# Patient Record
Sex: Female | Born: 1935 | Race: White | Hispanic: No | Smoking: Former smoker
Health system: Southern US, Community
[De-identification: ages and names within clinical notes are randomized; demographics above are authoritative.]

## PROBLEM LIST (undated history)

## (undated) DIAGNOSIS — S4991XA Unspecified injury of right shoulder and upper arm, initial encounter: Secondary | ICD-10-CM

## (undated) DIAGNOSIS — H3320 Serous retinal detachment, unspecified eye: Secondary | ICD-10-CM

## (undated) DIAGNOSIS — E876 Hypokalemia: Secondary | ICD-10-CM

## (undated) DIAGNOSIS — K5090 Crohn's disease, unspecified, without complications: Secondary | ICD-10-CM

## (undated) DIAGNOSIS — M359 Systemic involvement of connective tissue, unspecified: Secondary | ICD-10-CM

## (undated) DIAGNOSIS — Z9289 Personal history of other medical treatment: Secondary | ICD-10-CM

## (undated) DIAGNOSIS — M810 Age-related osteoporosis without current pathological fracture: Secondary | ICD-10-CM

## (undated) DIAGNOSIS — I4891 Unspecified atrial fibrillation: Secondary | ICD-10-CM

## (undated) DIAGNOSIS — J189 Pneumonia, unspecified organism: Secondary | ICD-10-CM

## (undated) DIAGNOSIS — K219 Gastro-esophageal reflux disease without esophagitis: Secondary | ICD-10-CM

## (undated) DIAGNOSIS — E785 Hyperlipidemia, unspecified: Secondary | ICD-10-CM

## (undated) DIAGNOSIS — K225 Diverticulum of esophagus, acquired: Secondary | ICD-10-CM

## (undated) DIAGNOSIS — M6530 Trigger finger, unspecified finger: Secondary | ICD-10-CM

## (undated) DIAGNOSIS — H18519 Endothelial corneal dystrophy, unspecified eye: Secondary | ICD-10-CM

## (undated) DIAGNOSIS — R1319 Other dysphagia: Secondary | ICD-10-CM

## (undated) DIAGNOSIS — I10 Essential (primary) hypertension: Secondary | ICD-10-CM

## (undated) DIAGNOSIS — H1851 Endothelial corneal dystrophy: Secondary | ICD-10-CM

## (undated) DIAGNOSIS — M1990 Unspecified osteoarthritis, unspecified site: Secondary | ICD-10-CM

## (undated) DIAGNOSIS — E46 Unspecified protein-calorie malnutrition: Secondary | ICD-10-CM

## (undated) DIAGNOSIS — R0600 Dyspnea, unspecified: Secondary | ICD-10-CM

## (undated) DIAGNOSIS — Z8781 Personal history of (healed) traumatic fracture: Secondary | ICD-10-CM

## (undated) DIAGNOSIS — D649 Anemia, unspecified: Secondary | ICD-10-CM

## (undated) DIAGNOSIS — E538 Deficiency of other specified B group vitamins: Secondary | ICD-10-CM

## (undated) HISTORY — PX: COLONOSCOPY: SHX174

## (undated) HISTORY — DX: Personal history of other medical treatment: Z92.89

## (undated) HISTORY — DX: Age-related osteoporosis without current pathological fracture: M81.0

## (undated) HISTORY — PX: ESOPHAGOGASTRODUODENOSCOPY: SHX1529

## (undated) HISTORY — DX: Anemia, unspecified: D64.9

## (undated) HISTORY — DX: Hyperlipidemia, unspecified: E78.5

## (undated) HISTORY — DX: Crohn's disease, unspecified, without complications: K50.90

## (undated) HISTORY — DX: Unspecified osteoarthritis, unspecified site: M19.90

## (undated) HISTORY — PX: EYE SURGERY: SHX253

## (undated) HISTORY — DX: Endothelial corneal dystrophy, unspecified eye: H18.519

## (undated) HISTORY — DX: Serous retinal detachment, unspecified eye: H33.20

## (undated) HISTORY — DX: Endothelial corneal dystrophy: H18.51

## (undated) HISTORY — DX: Other dysphagia: R13.19

## (undated) HISTORY — DX: Unspecified injury of right shoulder and upper arm, initial encounter: S49.91XA

---
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
## 1990-08-27 HISTORY — PX: CHOLECYSTECTOMY: SHX55

## 2002-05-27 DIAGNOSIS — S4991XA Unspecified injury of right shoulder and upper arm, initial encounter: Secondary | ICD-10-CM

## 2002-05-27 HISTORY — DX: Unspecified injury of right shoulder and upper arm, initial encounter: S49.91XA

## 2005-04-16 ENCOUNTER — Ambulatory Visit: Payer: Self-pay | Admitting: Unknown Physician Specialty

## 2005-05-01 ENCOUNTER — Ambulatory Visit: Payer: Self-pay | Admitting: Unknown Physician Specialty

## 2006-05-08 ENCOUNTER — Ambulatory Visit: Payer: Self-pay | Admitting: Unknown Physician Specialty

## 2006-09-05 ENCOUNTER — Ambulatory Visit: Payer: Self-pay | Admitting: Internal Medicine

## 2006-09-27 ENCOUNTER — Ambulatory Visit: Payer: Self-pay | Admitting: Internal Medicine

## 2006-10-26 ENCOUNTER — Ambulatory Visit: Payer: Self-pay | Admitting: Internal Medicine

## 2006-12-26 ENCOUNTER — Ambulatory Visit: Payer: Self-pay | Admitting: Internal Medicine

## 2007-01-01 ENCOUNTER — Ambulatory Visit: Payer: Self-pay | Admitting: Internal Medicine

## 2007-01-26 ENCOUNTER — Ambulatory Visit: Payer: Self-pay | Admitting: Internal Medicine

## 2007-02-12 ENCOUNTER — Ambulatory Visit: Payer: Self-pay | Admitting: Internal Medicine

## 2007-02-24 ENCOUNTER — Inpatient Hospital Stay: Payer: Self-pay | Admitting: Internal Medicine

## 2007-02-24 ENCOUNTER — Other Ambulatory Visit: Payer: Self-pay

## 2007-02-25 ENCOUNTER — Ambulatory Visit: Payer: Self-pay | Admitting: Internal Medicine

## 2007-03-03 ENCOUNTER — Ambulatory Visit: Payer: Self-pay | Admitting: Internal Medicine

## 2007-03-28 ENCOUNTER — Ambulatory Visit: Payer: Self-pay | Admitting: Internal Medicine

## 2007-04-28 ENCOUNTER — Ambulatory Visit: Payer: Self-pay | Admitting: Internal Medicine

## 2007-05-28 ENCOUNTER — Ambulatory Visit: Payer: Self-pay | Admitting: Internal Medicine

## 2007-06-28 ENCOUNTER — Ambulatory Visit: Payer: Self-pay | Admitting: Internal Medicine

## 2007-07-28 ENCOUNTER — Ambulatory Visit: Payer: Self-pay | Admitting: Internal Medicine

## 2007-08-28 ENCOUNTER — Ambulatory Visit: Payer: Self-pay | Admitting: Internal Medicine

## 2007-09-28 ENCOUNTER — Ambulatory Visit: Payer: Self-pay | Admitting: Internal Medicine

## 2007-10-08 ENCOUNTER — Ambulatory Visit: Payer: Self-pay | Admitting: Internal Medicine

## 2007-10-26 ENCOUNTER — Ambulatory Visit: Payer: Self-pay | Admitting: Internal Medicine

## 2007-11-26 ENCOUNTER — Ambulatory Visit: Payer: Self-pay | Admitting: Internal Medicine

## 2008-01-26 ENCOUNTER — Ambulatory Visit: Payer: Self-pay | Admitting: Internal Medicine

## 2008-01-28 ENCOUNTER — Ambulatory Visit: Payer: Self-pay | Admitting: Internal Medicine

## 2008-02-25 ENCOUNTER — Ambulatory Visit: Payer: Self-pay | Admitting: Internal Medicine

## 2008-03-27 ENCOUNTER — Ambulatory Visit: Payer: Self-pay | Admitting: Internal Medicine

## 2008-04-27 ENCOUNTER — Ambulatory Visit: Payer: Self-pay | Admitting: Internal Medicine

## 2008-05-05 ENCOUNTER — Ambulatory Visit: Payer: Self-pay | Admitting: Internal Medicine

## 2008-05-27 ENCOUNTER — Ambulatory Visit: Payer: Self-pay | Admitting: Internal Medicine

## 2008-07-20 ENCOUNTER — Ambulatory Visit: Payer: Self-pay | Admitting: Unknown Physician Specialty

## 2008-07-27 ENCOUNTER — Ambulatory Visit: Payer: Self-pay | Admitting: Internal Medicine

## 2008-08-04 ENCOUNTER — Ambulatory Visit: Payer: Self-pay | Admitting: Internal Medicine

## 2008-08-27 ENCOUNTER — Ambulatory Visit: Payer: Self-pay | Admitting: Internal Medicine

## 2008-12-25 ENCOUNTER — Ambulatory Visit: Payer: Self-pay | Admitting: Internal Medicine

## 2009-01-05 ENCOUNTER — Ambulatory Visit: Payer: Self-pay | Admitting: Internal Medicine

## 2009-01-25 ENCOUNTER — Ambulatory Visit: Payer: Self-pay | Admitting: Internal Medicine

## 2009-04-27 ENCOUNTER — Ambulatory Visit: Payer: Self-pay | Admitting: Internal Medicine

## 2009-05-27 ENCOUNTER — Ambulatory Visit: Payer: Self-pay | Admitting: Internal Medicine

## 2009-06-27 ENCOUNTER — Ambulatory Visit: Payer: Self-pay | Admitting: Internal Medicine

## 2009-07-20 ENCOUNTER — Ambulatory Visit: Payer: Self-pay | Admitting: Internal Medicine

## 2009-07-27 ENCOUNTER — Ambulatory Visit: Payer: Self-pay | Admitting: Internal Medicine

## 2009-09-27 ENCOUNTER — Ambulatory Visit: Payer: Self-pay | Admitting: Internal Medicine

## 2009-10-13 ENCOUNTER — Ambulatory Visit: Payer: Self-pay | Admitting: Internal Medicine

## 2009-10-25 ENCOUNTER — Ambulatory Visit: Payer: Self-pay | Admitting: Internal Medicine

## 2009-12-25 ENCOUNTER — Ambulatory Visit: Payer: Self-pay | Admitting: Internal Medicine

## 2010-01-05 ENCOUNTER — Ambulatory Visit: Payer: Self-pay | Admitting: Internal Medicine

## 2010-01-25 ENCOUNTER — Ambulatory Visit: Payer: Self-pay | Admitting: Internal Medicine

## 2010-02-08 ENCOUNTER — Ambulatory Visit: Payer: Self-pay | Admitting: Unknown Physician Specialty

## 2010-03-27 ENCOUNTER — Ambulatory Visit: Payer: Self-pay | Admitting: Internal Medicine

## 2010-03-30 ENCOUNTER — Ambulatory Visit: Payer: Self-pay | Admitting: Internal Medicine

## 2010-04-27 ENCOUNTER — Ambulatory Visit: Payer: Self-pay | Admitting: Internal Medicine

## 2010-07-06 ENCOUNTER — Ambulatory Visit: Payer: Self-pay | Admitting: Internal Medicine

## 2010-07-27 ENCOUNTER — Ambulatory Visit: Payer: Self-pay | Admitting: Internal Medicine

## 2010-09-29 ENCOUNTER — Ambulatory Visit: Payer: Self-pay | Admitting: Internal Medicine

## 2010-10-26 ENCOUNTER — Ambulatory Visit: Payer: Self-pay | Admitting: Internal Medicine

## 2011-01-24 ENCOUNTER — Ambulatory Visit: Payer: Self-pay | Admitting: Internal Medicine

## 2011-01-26 ENCOUNTER — Ambulatory Visit: Payer: Self-pay | Admitting: Internal Medicine

## 2011-04-13 ENCOUNTER — Ambulatory Visit: Payer: Self-pay | Admitting: Ophthalmology

## 2011-04-13 DIAGNOSIS — I499 Cardiac arrhythmia, unspecified: Secondary | ICD-10-CM

## 2011-04-18 ENCOUNTER — Ambulatory Visit: Payer: Self-pay | Admitting: Ophthalmology

## 2011-04-18 HISTORY — PX: OTHER SURGICAL HISTORY: SHX169

## 2011-05-16 ENCOUNTER — Ambulatory Visit: Payer: Self-pay | Admitting: Internal Medicine

## 2011-05-28 ENCOUNTER — Ambulatory Visit: Payer: Self-pay | Admitting: Internal Medicine

## 2011-07-13 ENCOUNTER — Ambulatory Visit: Payer: Self-pay | Admitting: Ophthalmology

## 2011-07-25 ENCOUNTER — Ambulatory Visit: Payer: Self-pay | Admitting: Ophthalmology

## 2011-07-25 HISTORY — PX: OTHER SURGICAL HISTORY: SHX169

## 2011-10-25 ENCOUNTER — Ambulatory Visit: Payer: Self-pay | Admitting: Ophthalmology

## 2011-10-25 DIAGNOSIS — I10 Essential (primary) hypertension: Secondary | ICD-10-CM

## 2011-10-25 LAB — POTASSIUM: Potassium: 3.9 mmol/L (ref 3.5–5.1)

## 2011-10-25 LAB — HEMOGLOBIN: HGB: 11 g/dL — ABNORMAL LOW (ref 12.0–16.0)

## 2011-11-07 ENCOUNTER — Ambulatory Visit: Payer: Self-pay | Admitting: Ophthalmology

## 2011-11-07 HISTORY — PX: OTHER SURGICAL HISTORY: SHX169

## 2012-01-29 ENCOUNTER — Ambulatory Visit: Payer: Self-pay | Admitting: Internal Medicine

## 2012-01-31 ENCOUNTER — Ambulatory Visit: Payer: Self-pay | Admitting: Unknown Physician Specialty

## 2012-02-04 LAB — PATHOLOGY REPORT

## 2012-07-08 HISTORY — PX: CORNEAL TRANSPLANT: SHX108

## 2012-08-12 ENCOUNTER — Ambulatory Visit: Payer: Self-pay | Admitting: Oncology

## 2012-08-26 ENCOUNTER — Ambulatory Visit: Payer: Self-pay | Admitting: Oncology

## 2012-08-26 LAB — CBC CANCER CENTER
Basophil %: 0.7 %
HCT: 33.5 % — ABNORMAL LOW (ref 35.0–47.0)
HGB: 11 g/dL — ABNORMAL LOW (ref 12.0–16.0)
Lymphocyte #: 1.1 x10 3/mm (ref 1.0–3.6)
Lymphocyte %: 16.2 %
MCHC: 32.8 g/dL (ref 32.0–36.0)
MCV: 84 fL (ref 80–100)
Monocyte %: 7.5 %
Neutrophil #: 5 x10 3/mm (ref 1.4–6.5)
RBC: 3.99 10*6/uL (ref 3.80–5.20)
WBC: 6.8 x10 3/mm (ref 3.6–11.0)

## 2012-08-26 LAB — IRON AND TIBC: Iron Bind.Cap.(Total): 401 ug/dL (ref 250–450)

## 2012-08-26 LAB — LACTATE DEHYDROGENASE: LDH: 189 U/L (ref 81–246)

## 2012-08-26 LAB — FOLATE: Folic Acid: 19.6 ng/mL (ref 3.1–100.0)

## 2012-08-27 ENCOUNTER — Ambulatory Visit: Payer: Self-pay | Admitting: Oncology

## 2012-09-01 DIAGNOSIS — Z9289 Personal history of other medical treatment: Secondary | ICD-10-CM

## 2012-09-01 HISTORY — DX: Personal history of other medical treatment: Z92.89

## 2012-09-08 DIAGNOSIS — H2511 Age-related nuclear cataract, right eye: Secondary | ICD-10-CM | POA: Insufficient documentation

## 2012-12-02 ENCOUNTER — Ambulatory Visit: Payer: Self-pay | Admitting: Physical Medicine and Rehabilitation

## 2012-12-19 ENCOUNTER — Ambulatory Visit: Payer: Self-pay | Admitting: Oncology

## 2013-01-12 ENCOUNTER — Ambulatory Visit: Payer: Self-pay | Admitting: General Practice

## 2013-01-12 LAB — URINALYSIS, COMPLETE
Bacteria: NONE SEEN
Bilirubin,UR: NEGATIVE
Blood: NEGATIVE
Glucose,UR: NEGATIVE mg/dL (ref 0–75)
Leukocyte Esterase: NEGATIVE
Nitrite: NEGATIVE
Protein: NEGATIVE
Squamous Epithelial: NONE SEEN

## 2013-01-12 LAB — BASIC METABOLIC PANEL
Anion Gap: 3 — ABNORMAL LOW (ref 7–16)
BUN: 22 mg/dL — ABNORMAL HIGH (ref 7–18)
Chloride: 101 mmol/L (ref 98–107)
EGFR (African American): >60
Glucose: 103 mg/dL — ABNORMAL HIGH (ref 65–99)
Osmolality: 272 (ref 275–301)
Potassium: 4.6 mmol/L (ref 3.5–5.1)
Sodium: 134 mmol/L — ABNORMAL LOW (ref 136–145)

## 2013-01-12 LAB — CBC
HCT: 33.7 % — ABNORMAL LOW (ref 35.0–47.0)
HGB: 11.1 g/dL — ABNORMAL LOW (ref 12.0–16.0)
MCHC: 32.8 g/dL (ref 32.0–36.0)
RBC: 4.08 10*6/uL (ref 3.80–5.20)
RDW: 14.7 % — ABNORMAL HIGH (ref 11.5–14.5)

## 2013-01-12 LAB — MRSA PCR SCREENING

## 2013-01-12 LAB — PROTIME-INR: Prothrombin Time: 12.7 secs (ref 11.5–14.7)

## 2013-01-12 LAB — APTT: Activated PTT: 28.4 secs (ref 23.6–35.9)

## 2013-01-28 ENCOUNTER — Inpatient Hospital Stay: Payer: Self-pay | Admitting: General Practice

## 2013-01-28 HISTORY — PX: JOINT REPLACEMENT: SHX530

## 2013-01-29 LAB — BASIC METABOLIC PANEL
BUN: 15 mg/dL (ref 7–18)
Chloride: 98 mmol/L (ref 98–107)
Creatinine: 0.9 mg/dL (ref 0.60–1.30)
Sodium: 131 mmol/L — ABNORMAL LOW (ref 136–145)

## 2013-01-30 ENCOUNTER — Encounter: Payer: Self-pay | Admitting: Internal Medicine

## 2013-01-30 LAB — BASIC METABOLIC PANEL
Anion Gap: 5 — ABNORMAL LOW (ref 7–16)
BUN: 11 mg/dL (ref 7–18)
Calcium, Total: 8.2 mg/dL — ABNORMAL LOW (ref 8.5–10.1)
Chloride: 100 mmol/L (ref 98–107)
EGFR (Non-African Amer.): >60
Glucose: 115 mg/dL — ABNORMAL HIGH (ref 65–99)
Osmolality: 265 (ref 275–301)
Potassium: 3.9 mmol/L (ref 3.5–5.1)
Sodium: 132 mmol/L — ABNORMAL LOW (ref 136–145)

## 2013-01-30 LAB — PATHOLOGY REPORT

## 2013-01-30 LAB — PLATELET COUNT: Platelet: 392 10*3/uL (ref 150–440)

## 2013-01-30 LAB — HEMOGLOBIN: HGB: 9.1 g/dL — ABNORMAL LOW (ref 12.0–16.0)

## 2013-11-10 ENCOUNTER — Encounter: Payer: Self-pay | Admitting: General Surgery

## 2013-11-10 ENCOUNTER — Ambulatory Visit (INDEPENDENT_AMBULATORY_CARE_PROVIDER_SITE_OTHER): Payer: Medicare Other | Admitting: General Surgery

## 2013-11-10 VITALS — BP 142/62 | HR 78 | Temp 98.5°F | Resp 12 | Ht 61.0 in | Wt 155.0 lb

## 2013-11-10 DIAGNOSIS — L089 Local infection of the skin and subcutaneous tissue, unspecified: Secondary | ICD-10-CM | POA: Insufficient documentation

## 2013-11-10 DIAGNOSIS — L723 Sebaceous cyst: Secondary | ICD-10-CM

## 2013-11-10 DIAGNOSIS — R222 Localized swelling, mass and lump, trunk: Secondary | ICD-10-CM

## 2013-11-10 DIAGNOSIS — R1900 Intra-abdominal and pelvic swelling, mass and lump, unspecified site: Secondary | ICD-10-CM

## 2013-11-10 NOTE — Patient Instructions (Addendum)
May leave dressing in place for 2 days then Change the dressing as needed Return for suture removal

## 2013-11-10 NOTE — Progress Notes (Signed)
Patient ID: Markeita Alicia, female   DOB: 05/09/1931, 80 y.o.   MRN: [id number]  Chief Complaint  Patient presents with  . Breast Problem    abscess under left breast    HPI Penelopi Mikrut is a 80 y.o. female.  Here for evaluation of boil/abscess under left breast.  States she noticed it 3-4 months ago. Started as a "clear mole", no color. Then last week it got large and red and was painful.  States it opened up Saturday 11-07-13, pus. Currently on doxycycline. The patient reports a marked decrease in tenderness and redness since the drainage occurred and antibiotics initiated. She is having minimal pain at this time. She is accompanied today by her daughter, Clement Husbands.  HPI  Past Medical History  Diagnosis Date  . Crohn disease     Past Surgical History  Procedure Laterality Date  . Cholecystectomy  1992  . Tonsillectomy  1950  . Appendectomy  1949  . Eye surgery  2012, 2013    5 total  . Joint replacement Right 01-28-13    No family history on file.  Social History History  Substance Use Topics  . Smoking status: Former Smoker -- 30 years    Quit date: 08/27/1990  . Smokeless tobacco: Not on file  . Alcohol Use: Yes     Comment: rarely    No Known Allergies  Current Outpatient Prescriptions  Medication Sig Dispense Refill  . aspirin 81 MG tablet Take 81 mg by mouth daily.      . budesonide (ENTOCORT EC) 3 MG 24 hr capsule Take 3 mg by mouth daily.       . Calcium Carb-Cholecalciferol (CALCIUM 600 + D PO) Take 3 tablets by mouth daily.      . Cyanocobalamin (VITAMIN B-12 IJ) Inject as directed every 30 (thirty) days.      . DHA-EPA-Vitamin E (OMEGA-3 COMPLEX PO) Take 2 tablets by mouth daily.      Marland Kitchen doxycycline (VIBRAMYCIN) 100 MG capsule Take 100 mg by mouth 2 (two) times daily.      . Iron 66 MG TABS Take 2 tablets by mouth daily.      Marland Kitchen KLOR-CON M20 20 MEQ tablet Take 40 mEq by mouth daily.       . Magnesium 400 MG CAPS Take 5 tablets by mouth daily.      . Multiple  Vitamin (MULTIVITAMIN) capsule Take 1 capsule by mouth daily.      Marland Kitchen NEXIUM 40 MG capsule Take 40 mg by mouth daily.       Marland Kitchen PENTASA 500 MG CR capsule Take 500 mg by mouth 3 (three) times daily.       Marland Kitchen triamterene-hydrochlorothiazide (DYAZIDE) 37.5-25 MG per capsule Take 1 capsule by mouth daily.       . TRICOR 145 MG tablet Take 145 mg by mouth daily.       . vitamin C (ASCORBIC ACID) 500 MG tablet Take 500 mg by mouth daily.      . Vitamin D, Cholecalciferol, 1000 UNITS TABS Take 2 tablets by mouth daily.       No current facility-administered medications for this visit.    Review of Systems Review of Systems  Blood pressure 142/62, pulse 78, temperature 98.5 F (36.9 C), temperature source Oral, resp. rate 12, height 5' 1"  (1.549 m), weight 155 lb (70.308 kg).  Physical Exam Physical Exam  Constitutional: She is oriented to person, place, and time. She appears well-developed and well-nourished.  Neck:  Neck supple.  Cardiovascular: Normal rate, regular rhythm and normal heart sounds.   Pulmonary/Chest: Effort normal and breath sounds normal.  Lymphadenopathy:    She has no cervical adenopathy.  Neurological: She is alert and oriented to person, place, and time.  Skin: Skin is warm and dry.     1.5 cm sebaceous cyst posterior shoulder  Left abdominal wall 2 cm area with drainage and redness       Assessment    Infected sebaceous cyst, inadequate drainage.     Plan    The patient was amenable to unroofing the area to provide better drainage and prompt resolution. 10 cc of 0.5% Xylocaine with 0.25% Marcaine with 1-200,000 units of epinephrine was utilized well-tolerated. Chlor prep was applied to the area. Through elliptical incisions draining site was excised as well as all of the underlying inflammatory tissue. Scant bleeding was noted. The skin was loosely approximated with 4-0 nylon sutures x2. A dry dressing with Telfa and Tegaderm was applied. The patient was  instructed to leave this in place for 48 hours. She may shower anytime. After the waterproof dressing is removed the area should be covered until no further drainage is appreciated.  The patient will follow up with the nurse in one week for wound evaluation.        Robert Bellow 11/10/2013, 9:16 PM

## 2013-11-12 LAB — PATHOLOGY

## 2013-11-18 ENCOUNTER — Ambulatory Visit (INDEPENDENT_AMBULATORY_CARE_PROVIDER_SITE_OTHER): Payer: Self-pay | Admitting: *Deleted

## 2013-11-18 DIAGNOSIS — L089 Local infection of the skin and subcutaneous tissue, unspecified: Secondary | ICD-10-CM

## 2013-11-18 DIAGNOSIS — L723 Sebaceous cyst: Secondary | ICD-10-CM

## 2013-11-18 NOTE — Progress Notes (Signed)
Here today for wound check. Sutures removed. Area clean, no infection noted. Aware of pathology.

## 2013-11-18 NOTE — Patient Instructions (Signed)
The patient is aware to call back for any questions or concerns.  

## 2013-11-26 ENCOUNTER — Ambulatory Visit: Payer: Self-pay | Admitting: General Surgery

## 2014-06-28 ENCOUNTER — Encounter: Payer: Self-pay | Admitting: General Surgery

## 2014-12-17 NOTE — Discharge Summary (Signed)
PATIENT NAME:  Joanne [name], Joanne [name] MR#:  [id number] DATE OF BIRTH:  07-03-1931  DATE OF ADMISSION:  01/28/2013 DATE OF DISCHARGE:  01/31/2013  DICTATED FOR:  Dr. Skip Estimable  ADMITTING DIAGNOSIS:  Degenerative arthrosis of right hip.   DISCHARGE DIAGNOSIS:  Degenerative arthrosis of right hip.   HISTORY OF PRESENT ILLNESS:  The patient is a 81 year old female who has been followed at [facility] for progression of right hip pain. She had reported a several-year history of right groin and hip pain with some episodes of radiation down the leg. The pain was aggravated with weight-bearing activities. She has appreciated progressive decrease in her range of motion of the right hip. She had been evaluated by Dr. Leanord Hawking who obtained an MRI of the lumbar spine demonstrating disk bulge without neural impingement. The patient notes increased hip and groin pain with arising from a sitting position, bending of the hip or getting in and out of an automobile.  The patient does not see any significant improvement in her condition despite the use of anti-inflammatory medications. Prednisone did not provide any significant improvement in her hip or groin pain. She was not using any ambulatory aid at the time of surgery, although she admits that she had limited her activity due to the pain. Also denied any gross numbness or weakness at the lower extremity. The patient states that the pain increased in severity to the point that significantly interfering with her activities of daily living.  X-rays taken at [facility] showed significant narrowing of the cartilage space with subchondral sclerosis, as well as early osteophyte formation.  After discussion of the risks and benefits of surgical intervention, the patient expressed her understanding of the risks and benefits and agreed for plans for surgical intervention.   PROCEDURE:  Right total knee arthroplasty using computer-assisted navigation.   ANESTHESIA:   Spinal.   IMPLANTS UTILIZED:  DePuy 13.5 mm small stature AML femoral component, 50 mm outer diameter Pinnacle Gription sector acetabular component, +4 mm 10 degree Pinnacle Marathon polyethylene liner, and a 32 mm cobalt chrome hip ball with a +1 mm neck length.   HOSPITAL COURSE:  The patient tolerated the procedure very well. She had no complications. She was then taken to PACU where she was stabilized, then transferred to the orthopedic floor. She began receiving anticoagulation therapy of Lovenox 30 mg subcutaneous q. 12 hours per anesthesia and pharmacy protocol. She was fitted with TED stockings bilaterally. These were allowed to be removed one hour per 8-hour shift. She was also fitted with the AVI compression foot pumps bilaterally set at 80 mmHg. Her calves have been nontender. There has been no evidence of any DVTs to the lower extremity. Negative Homans sign. Heels were elevated off the bed using rolled towels.   The patient has denied any chest pain or shortness of breath. Vital signs have been stable. She has been afebrile. Hemodynamically she was stable and no transfusions were given.   Physical therapy was initiated on Day 1 for gait training and strenght training. Upon being discharged, she was ambulating greater than 100 feet. Was showing good progress and had gross motion at the lower extremity. Occupational therapy was also initiated on Day 1 for ADL assistive devices.   The patient's IV, Foley and Hemovac were all discontinued on Day 2 along with a dressing change. The wound was free of any drainage or signs of infection. Upon being discharged, there was a little bit of serosanguineous drainage but no  signs of infection.   DRUG ALLERGIES:  No known drug allergies.   The patient is being discharged to skilled nursing facility in improved stable condition.  She may weight bear as tolerated. She is to continue using a walker until cleared by physical therapy  to go to a quad cane.   Continue with TED stockings bilaterally. These are allowed to be removed one hour per 8-hour shift. Elevate the heels off the bed.  PT for gait training strenght training.  OT for ADL assistive devices.  Incentive spirometry q. 1 hour while awake.  Encourage cough, deep breathing q. 2 hours while awake.   She has a followup appointment on July 17 at 1:30 with Dr. Marry Guan. She is to call the clinic sooner if any temperatures of 101.5 or greater, excessive bleeding or concerns and the wound or her treatment. Staples are to be removed on 02/11/2013. No showers to be taken until then.  Change dressing as needed.   MEDICATIONS: 1.  Ascorbic acid 500 mg daily. 2.  Entocort 6 mg daily. 3.  Calcium carbonate 500 mg with vitamin D 200 units 1 tablet t.i.d. 4.  Vitamin D3 2000 units daily. 5.  Senokot-S 1 tablet b.i.d. 6.  Nexium 40 mg daily q. a.c. breakfast. 7.  Fenofibrate micronized 140 mg daily usual schedule every day 8:00 a.m. the patient's own medicine. 8.  Ferrous sulfate 650 mg daily. 9.  Max ox 1000 mg daily.  10.  Multivitamin capsule 1 tablet daily. 11.  Pentasa ER 500 mg 2 capsules t.i.d., the patient's own medication. 12.  (Dictation Anomaly)/hydrochlorothiazide 1 capsule daily. 13.  Potassium chloride 60 mg daily. 14.  Prednisolone acetate 1% optic drops, one drop left eye q. 12 hours. 15.  Lovenox 30 mg subcutaneous q. 12 hours for 14 days, then discontinue and begin taking one 81 mg enteric-coated aspirin. 16.  Fosamax 70 mg weekly. 17.  The patient is scheduled Tylenol ES 500 to 1000 mg q. 4 to 6 hours p.r.n. for temperatures of 100.4 or greater for pain. 18.  Mylanta DS 30 mL q. 6 hours p.r.n. 19.  Dulcolax suppository 10 mg rectally p.r.n. 20.  Lomotil 1 tablet q. p.m. p.r.n. 21.  Milk of Magnesia 30 mL b.i.d. p.r.n. 22.  Oxycodone 5 to 10 mg q. 4 to 6 hours p.r.n. for pain. 23.  Travenol 50 to 100 mg q. 4 to 6 hours p.r.n. for pain.  24.  Enema soapsuds if no results with Milk  of Magnesia or Dulcolax.   PAST MEDICAL HISTORY: 1.  Crohn's disease.  2.  Anemia. 3.  Hernia.  4.  Hypercholesterolemia.  5.  Arthritis.    ____________________________ Vance Peper, PA [email or web address]:[id number] D: 01/31/2013 08:04:53 ET T: 01/31/2013 08:36:08 ET JOB#: [id number]  cc: Vance Peper, PA, <Dictator> JON WOLFE PA ELECTRONICALLY SIGNED 02/02/2013 11:54

## 2014-12-17 NOTE — Op Note (Signed)
PATIENT NAME:  Joanne [name], Joanne [name] MR#:  [id number] DATE OF BIRTH:  April 03, 1931  DATE OF PROCEDURE:  01/28/2013  PREOPERATIVE DIAGNOSIS: Degenerative arthrosis of the right hip.   POSTOPERATIVE DIAGNOSIS: Degenerative arthrosis of the left knee.   PROCEDURE PERFORMED: Right total hip arthroplasty.   SURGEON: Laurice Record. Hooten, M.D.   ASSISTANT: Vance Peper, PA.   ANESTHESIA: Spinal.   ESTIMATED BLOOD LOSS: 200 mL.   FLUIDS REPLACED: 1200 mL of crystalloid.   DRAINS: Two medium drains to Hemovac reservoir.   IMPLANTS UTILIZED: DePuy 13.5 mm small stature AML femoral component, 50 mm outer diameter Pinnacle Gription Sector acetabular component, +4 mm 10 degree Pinnacle Marathon polyethylene liner and a 32 mm cobalt chrome hip ball with a +1 mm neck length.   INDICATIONS FOR SURGERY: The patient is a 81 year old female, who has been seen for complaints of progressive right hip and groin pain. X-rays demonstrated severe degenerative changes to the right hip. She did not see any significant improvement despite conservative nonsurgical intervention. After discussion of the risks and benefits of surgical intervention, the patient expressed understanding of the risks and benefits and agreed with plans for surgical intervention.   PROCEDURE IN DETAIL: The patient was brought into the operating room and, after adequate spinal anesthesia was achieved, the patient was placed in the left lateral decubitus position. Axillary roll was placed and all bony prominences were well padded. The patient's right hip and leg were cleaned and prepped with alcohol and DuraPrep and draped in the usual sterile fashion. A "timeout" was performed as per usual protocol. A lateral curvilinear incision was made gently curving towards the posterior superior iliac spine. IT band was incised in line with the skin incisions. Fibers of the gluteus maximus were split in line. The piriformis tendon was identified, skeletonized, and incised at  its insertion on the proximal femur and reflected posteriorly. In a similar fashion, the short external rotators were incised and reflected posteriorly. A T-type posterior capsulotomy was performed. A large joint effusion was evacuated. Prior to dislocation of the femoral head, a threaded Steinmann pin was inserted through a separate stab incision into the pelvis superior to the acetabulum and then bent in the form of a stylus so as to assess limb length and hip offset throughout the procedure. The femoral head was then dislocated posteriorly. Severe degenerative changes were noted notably to the superior aspect. The femoral neck cut was performed using an oscillating saw. The anterior capsule was elevated off of the femoral neck. Inspection of the acetabulum also demonstrated significant degenerative changes. Remnant of the labrum was excised. The acetabulum was then reamed in a sequential fashion up to a 49 mm diameter. Good punctate bleeding bone was encountered. A 50 mm outer diameter Pinnacle Gription Sector acetabular component was positioned and impacted into place. Good scratch fit was appreciated. A +4 neutral polyethylene trial was inserted and attention was directed to the proximal femur.   A pilot hole for reaming of the proximal femoral canal was created using a high-speed bur. Proximal femoral canal was reamed in sequential fashion up to a 13 mm diameter. This allowed for approximately 6 cm of scratch fit. The proximal femur was prepared using a 13.5 mm side-biting reamer. Serial broaches were inserted up to a 13.5 mm small stature broach. The calcar region was planed and trial reduction was performed using a 32 mm trial with a +1 mm neck segment. Good equalization of limb length and hip offset was appreciated. It was  elected to also trial with a 10 degree +4 liner. High side was placed approximately the 8 o'clock position. This also allowed for excellent equalization of limb lengths and hip offset,  but improved posterior stability. Trial components were removed. The acetabular shell was irrigated with normal saline with antibiotic solution. A +4 mm 10 degree Pinnacle Marathon polyethylene liner was positioned with high side at the 8 o'clock position and impacted into place. Next, a 13.5 mm small stature AML femoral component was positioned and impacted into place. Excellent scratch fit was appreciated. Trial reduction was then performed with a 32 mm hip ball with a +1 mm neck length. Again, excellent anterior and posterior stability was appreciated and good equalization of hip offset and limb lengths was noted. Trial hip ball was removed. The Morse taper was cleaned and dried. A 32 mm cobalt chrome hip ball with a +1 mm neck segment was placed on the trunnion and impacted into place. The hip was reduced and placed through a range of motion with excellent stability and equalization of limb length noted.   The wound was irrigated with copious amounts of saline with antibiotic solution using pulsatile lavage and then suctioned dry. Good hemostasis was appreciated. The posterior capsulotomy was repaired using #5 Ethibond. The piriformis tendon was reapproximated on the undersurface of the gluteus medius tendon using #5 Ethibond. Two medium drains were placed in the wound bed and brought out through a separate stab incision so as to be attached to a Hemovac reservoir. IT band was repaired using interrupted sutures of #1 Vicryl. The subcutaneous tissue was approximated in layers using first #0 Vicryl followed by 2-0 Vicryl. Skin was closed with skin staples. Sterile dressing was applied. The patient tolerated the procedure well. She was transported to the recovery room in stable condition.   ____________________________ Laurice Record. Holley Bouche., MD jph:aw D: 01/29/2013 06:42:55 ET T: 01/29/2013 06:57:13 ET JOB#: [id number]  cc: Laurice Record. Holley Bouche., MD, <Dictator> Laurice Record Holley Bouche MD ELECTRONICALLY SIGNED  01/30/2013 19:48

## 2014-12-19 NOTE — Op Note (Signed)
PATIENT NAME:  Joanne [name], Joanne [name] MR#:  [id number] DATE OF BIRTH:  1931-03-29  DATE OF PROCEDURE:  11/07/2011  PROCEDURES PERFORMED:  1. Pars plana vitrectomy of the left eye.  2. Silicone oil removal left eye.  3. Posterior synechiolysis of the left eye.   PREOPERATIVE DIAGNOSES:  1. Retained silicone oil of the left eye.  2. Posterior synechia of the iris   POSTOPERATIVE DIAGNOSES:  1. Retained silicone oil of the left eye.  2. Posterior synechia of the iris   PRIMARY SURGEON: Teresa Pelton. Rhylee Pucillo, MD  ANESTHESIA: Retrobulbar block of the left eye with monitored anesthesia care.   ESTIMATED BLOOD LOSS: Less than 1 mL.   COMPLICATIONS: None.   INDICATIONS FOR PROCEDURE: This is a patient who has undergone multiple retinal detachment repairs. Patient has retained silicone oil from these repairs with a double bubble in the front and the back of the eye. This has served to create posterior synechiae of the iris to the posterior chamber IOL thus interfering with ease of flow from the ciliary body to the anterior chamber of aqueous. Risks, benefits and alternatives to the above procedure were discussed and the patient wished to proceed.   DETAILS OF PROCEDURE: After informed consent was obtained, the patient was brought into the operative suite at [facility]. Patient was placed in supine position, was given a small dose of propofol and a retrobulbar block was performed on the left eye by the primary surgeon without any complications. The left eye was prepped and draped in sterile manner. After lid speculum was inserted a 25-gauge trocar was placed through displaced conjunctiva in an oblique fashion 3.5 mm beyond the limbus in the inferotemporal quadrant. The infusion cannula was turned on and inserted through the trocar and secured in position with Steri-Strips. A T-shaped conjunctival peritomy was created superotemporally. A sclerotomy was created approximately 3.5 mm beyond  the limbus underneath the conjunctival peritomy. The silicone oil extractor was inserted and the silicone in posterior chamber was completely removed. The light pipe was introduced and the retina was investigated. No signs of any tears or breaks could be identified. The posterior retina attached. 7-0 Vicryl was used to close the sclerotomy. A small clear corneal incision was created superotemporally and the anterior chamber silicone oil was removed. An iris sweep was inserted and the posterior chamber IOL was dunked underneath the iris given that it was anterior to the iris superotemporally. Synechiolysis was then completed for the rest of the iris for approximately 300 degrees. Once this was completed the clear corneal wound was noted to self seal and be watertight, even with small amount of indentation. The conjunctival peritomy was then closed using interrupted 6-0 plain gut. The infusion cannula was removed and this wound was closed using transconjunctival 6-0 plain gut. The eye was confirmed pressure of approximately 15 mmHg. 5 mg of dexamethasone was given into the inferior fornix and the lid speculum was removed. The eye was cleaned and TobraDex was placed on the eye. A patch and shield were placed over the eye and the patient was taken to postanesthesia care with instructions to remain head up.   ____________________________ Teresa Pelton. Starling Manns, MD mfa:cms D: 11/07/2011 08:11:36 ET T: 11/07/2011 12:46:55 ET JOB#: [id number]  cc: Teresa Pelton. Starling Manns, MD, <Dictator> Coralee Rud MD ELECTRONICALLY SIGNED 11/14/2011 11:42

## 2015-02-21 ENCOUNTER — Encounter: Payer: Self-pay | Admitting: General Practice

## 2015-02-21 ENCOUNTER — Inpatient Hospital Stay
Admission: EM | Admit: 2015-02-21 | Discharge: 2015-02-23 | DRG: 641 | Disposition: A | Discharge status: Home / Self Care | Payer: Medicare Other | Attending: Internal Medicine | Admitting: Internal Medicine

## 2015-02-21 DIAGNOSIS — Z87891 Personal history of nicotine dependence: Secondary | ICD-10-CM

## 2015-02-21 DIAGNOSIS — E871 Hypo-osmolality and hyponatremia: Secondary | ICD-10-CM | POA: Diagnosis present

## 2015-02-21 DIAGNOSIS — K5090 Crohn's disease, unspecified, without complications: Secondary | ICD-10-CM | POA: Diagnosis present

## 2015-02-21 DIAGNOSIS — K219 Gastro-esophageal reflux disease without esophagitis: Secondary | ICD-10-CM | POA: Diagnosis present

## 2015-02-21 DIAGNOSIS — Z7982 Long term (current) use of aspirin: Secondary | ICD-10-CM | POA: Diagnosis not present

## 2015-02-21 DIAGNOSIS — E876 Hypokalemia: Secondary | ICD-10-CM | POA: Diagnosis present

## 2015-02-21 DIAGNOSIS — R531 Weakness: Secondary | ICD-10-CM

## 2015-02-21 DIAGNOSIS — D649 Anemia, unspecified: Secondary | ICD-10-CM | POA: Diagnosis present

## 2015-02-21 DIAGNOSIS — R339 Retention of urine, unspecified: Secondary | ICD-10-CM | POA: Diagnosis present

## 2015-02-21 HISTORY — DX: Trigger finger, unspecified finger: M65.30

## 2015-02-21 LAB — BASIC METABOLIC PANEL
Anion gap: 9 (ref 5–15)
BUN: 16 mg/dL (ref 6–20)
CO2: 23 mmol/L (ref 22–32)
Calcium: 7.6 mg/dL — ABNORMAL LOW (ref 8.9–10.3)
Chloride: 91 mmol/L — ABNORMAL LOW (ref 101–111)
Creatinine, Ser: 0.69 mg/dL (ref 0.44–1.00)
GFR calc Af Amer: >60 mL/min (ref >60)
Glucose, Bld: 105 mg/dL — ABNORMAL HIGH (ref 65–99)
POTASSIUM: 2.8 mmol/L — AB (ref 3.5–5.1)
Sodium: 123 mmol/L — ABNORMAL LOW (ref 135–145)

## 2015-02-21 LAB — CBC
HEMATOCRIT: 32.3 % — AB (ref 35.0–47.0)
HEMOGLOBIN: 10.8 g/dL — AB (ref 12.0–16.0)
MCH: 28.5 pg (ref 26.0–34.0)
MCHC: 33.6 g/dL (ref 32.0–36.0)
MCV: 84.9 fL (ref 80.0–100.0)
Platelets: 506 10*3/uL — ABNORMAL HIGH (ref 150–440)
RBC: 3.8 MIL/uL (ref 3.80–5.20)
RDW: 14.5 % (ref 11.5–14.5)
WBC: 9.6 10*3/uL (ref 3.6–11.0)

## 2015-02-21 LAB — TROPONIN I: Troponin I: <0.03 ng/mL (ref <0.031)

## 2015-02-21 LAB — MAGNESIUM: MAGNESIUM: 1.6 mg/dL — AB (ref 1.7–2.4)

## 2015-02-21 MED ORDER — ONDANSETRON HCL 4 MG/2ML IJ SOLN: 4.0000 mg | Freq: Four times a day (QID) | INTRAMUSCULAR | Status: DC | PRN

## 2015-02-21 MED ORDER — VITAMIN C 500 MG PO TABS
500.0000 mg | ORAL_TABLET | Freq: Every day | ORAL | Status: DC
  Administered 2015-02-22 – 2015-02-23 (×2): 500 mg via ORAL
  Filled 2015-02-21 (×2): qty 1

## 2015-02-21 MED ORDER — PROBIOTIC PO CAPS: 1.0000 | ORAL_CAPSULE | Freq: Every day | ORAL | Status: DC

## 2015-02-21 MED ORDER — ADULT MULTIVITAMIN W/MINERALS CH
1.0000 | ORAL_TABLET | Freq: Every day | ORAL | Status: DC
Start: 2015-02-22 — End: 2015-02-23
  Administered 2015-02-22 – 2015-02-23 (×2): 1 via ORAL
  Filled 2015-02-21 (×2): qty 1

## 2015-02-21 MED ORDER — POTASSIUM CHLORIDE CRYS ER 20 MEQ PO TBCR
40.0000 meq | EXTENDED_RELEASE_TABLET | Freq: Every day | ORAL | Status: DC
  Administered 2015-02-22 – 2015-02-23 (×2): 40 meq via ORAL
  Filled 2015-02-21 (×2): qty 2

## 2015-02-21 MED ORDER — FENOFIBRATE 54 MG PO TABS
54.0000 mg | ORAL_TABLET | Freq: Every day | ORAL | Status: DC
  Administered 2015-02-22 – 2015-02-23 (×2): 54 mg via ORAL
  Filled 2015-02-21 (×2): qty 1

## 2015-02-21 MED ORDER — PANTOPRAZOLE SODIUM 40 MG PO TBEC
40.0000 mg | DELAYED_RELEASE_TABLET | Freq: Every day | ORAL | Status: DC
  Administered 2015-02-22 – 2015-02-23 (×2): 40 mg via ORAL
  Filled 2015-02-21 (×2): qty 1

## 2015-02-21 MED ORDER — SODIUM CHLORIDE 0.9 % IV SOLN
Freq: Once | INTRAVENOUS | Status: AC
  Administered 2015-02-21: 1000 mL via INTRAVENOUS

## 2015-02-21 MED ORDER — ACETAMINOPHEN 650 MG RE SUPP: 650.0000 mg | Freq: Four times a day (QID) | RECTAL | Status: DC | PRN

## 2015-02-21 MED ORDER — BUDESONIDE 3 MG PO CPEP
6.0000 mg | ORAL_CAPSULE | Freq: Every day | ORAL | Status: DC
  Administered 2015-02-22 – 2015-02-23 (×2): 6 mg via ORAL
  Filled 2015-02-21 (×3): qty 2

## 2015-02-21 MED ORDER — MULTIVITAMINS PO CAPS: 1.0000 | ORAL_CAPSULE | Freq: Every day | ORAL | Status: DC

## 2015-02-21 MED ORDER — ENOXAPARIN SODIUM 40 MG/0.4ML ~~LOC~~ SOLN
40.0000 mg | SUBCUTANEOUS | Status: DC
  Administered 2015-02-21 – 2015-02-22 (×2): 40 mg via SUBCUTANEOUS
  Filled 2015-02-21 (×2): qty 0.4

## 2015-02-21 MED ORDER — MAGNESIUM SULFATE 2 GM/50ML IV SOLN
2.0000 g | Freq: Once | INTRAVENOUS | Status: AC
  Administered 2015-02-21: 2 g via INTRAVENOUS

## 2015-02-21 MED ORDER — POTASSIUM CHLORIDE IN NACL 20-0.9 MEQ/L-% IV SOLN
INTRAVENOUS | Status: DC
  Administered 2015-02-21 – 2015-02-23 (×3): via INTRAVENOUS
  Filled 2015-02-21 (×5): qty 1000

## 2015-02-21 MED ORDER — MAGNESIUM OXIDE 400 (241.3 MG) MG PO TABS
400.0000 mg | ORAL_TABLET | Freq: Three times a day (TID) | ORAL | Status: DC
  Administered 2015-02-21 – 2015-02-23 (×5): 400 mg via ORAL
  Filled 2015-02-21 (×5): qty 1

## 2015-02-21 MED ORDER — ASPIRIN 81 MG PO CHEW
81.0000 mg | CHEWABLE_TABLET | Freq: Every day | ORAL | Status: DC
  Administered 2015-02-22 – 2015-02-23 (×2): 81 mg via ORAL
  Filled 2015-02-21 (×2): qty 1

## 2015-02-21 MED ORDER — PREDNISOLONE ACETATE 0.12 % OP SUSP
1.0000 [drp] | Freq: Every day | OPHTHALMIC | Status: DC
  Administered 2015-02-22: 09:00:00 1 [drp] via OPHTHALMIC
  Filled 2015-02-21: qty 5

## 2015-02-21 MED ORDER — FERROUS SULFATE 325 (65 FE) MG PO TABS
325.0000 mg | ORAL_TABLET | Freq: Every day | ORAL | Status: DC
  Administered 2015-02-22 – 2015-02-23 (×2): 325 mg via ORAL
  Filled 2015-02-21 (×2): qty 1

## 2015-02-21 MED ORDER — POTASSIUM CHLORIDE IN NACL 20-0.9 MEQ/L-% IV SOLN
INTRAVENOUS | Status: AC
  Filled 2015-02-21: qty 1000

## 2015-02-21 MED ORDER — ONDANSETRON HCL 4 MG PO TABS: 4.0000 mg | ORAL_TABLET | Freq: Four times a day (QID) | ORAL | Status: DC | PRN

## 2015-02-21 MED ORDER — RISAQUAD PO CAPS
1.0000 | ORAL_CAPSULE | Freq: Every day | ORAL | Status: DC
  Administered 2015-02-22 – 2015-02-23 (×2): 1 via ORAL
  Filled 2015-02-21 (×2): qty 1

## 2015-02-21 MED ORDER — MAGNESIUM SULFATE 2 GM/50ML IV SOLN
INTRAVENOUS | Status: AC
  Administered 2015-02-21: 2 g via INTRAVENOUS
  Filled 2015-02-21: qty 50

## 2015-02-21 MED ORDER — VITAMIN D 1000 UNITS PO TABS
1000.0000 [IU] | ORAL_TABLET | Freq: Every day | ORAL | Status: DC
  Administered 2015-02-22 – 2015-02-23 (×2): 1000 [IU] via ORAL
  Filled 2015-02-21 (×2): qty 1

## 2015-02-21 MED ORDER — ACETAMINOPHEN 325 MG PO TABS: 650.0000 mg | ORAL_TABLET | Freq: Four times a day (QID) | ORAL | Status: DC | PRN

## 2015-02-21 MED ORDER — POTASSIUM CHLORIDE CRYS ER 20 MEQ PO TBCR
40.0000 meq | EXTENDED_RELEASE_TABLET | Freq: Once | ORAL | Status: AC
  Administered 2015-02-21: 23:00:00 40 meq via ORAL
  Filled 2015-02-21: qty 2

## 2015-02-21 MED ORDER — MESALAMINE ER 250 MG PO CPCR
1000.0000 mg | ORAL_CAPSULE | Freq: Three times a day (TID) | ORAL | Status: DC
Start: 2015-02-21 — End: 2015-02-23
  Administered 2015-02-21 – 2015-02-23 (×5): 1000 mg via ORAL
  Filled 2015-02-21 (×9): qty 4

## 2015-02-21 MED ORDER — SODIUM CHLORIDE 0.9 % IJ SOLN
3.0000 mL | Freq: Two times a day (BID) | INTRAMUSCULAR | Status: DC
  Administered 2015-02-22: 09:00:00 3 mL via INTRAVENOUS

## 2015-02-21 NOTE — ED Notes (Signed)
Pt. Arrived to ed from home, brought in by daughter. Pt verbalized that over the last week she has been experienced generalized weakness over the last week, along with bodyaches. Pt verbalized experiencing diarrhea last week, with decrease urine output. Patient states "i have no appetite this week".  Pt was sent over by [facility]. Blood work drawn by pt. PT alert and oriented at this time.

## 2015-02-21 NOTE — ED Notes (Signed)
Patient initially assigned bed 329, room never became "ready".  Patient orders reviewed and telemetry ordered.  Floor called and verified that they were not expecting this patient.  Nursing supervisor called and informed that initial bed, which was assigned by bed control, appears to be in error.  Supervisor to investigate.

## 2015-02-21 NOTE — H&P (Signed)
[facility] [place] NAME: Mikiah Durall    MR#:  [id number]  DATE OF BIRTH:  [birth year]  DATE OF ADMISSION:  02/21/2015  PRIMARY CARE PHYSICIAN: Tracie Harrier, MD   REQUESTING/REFERRING PHYSICIAN: Suezanne Jacquet, M.D.  CHIEF COMPLAINT:   Chief Complaint  Patient presents with  . Weakness  . Urinary Retention    HISTORY OF PRESENT ILLNESS:  Ady Heimann  is a 81 y.o. female with a known history of history of Crohn's disease. She was brought in by her family against her will. She states that she has chronic diarrhea and on Friday she had a quite a bit of diarrhea. On Saturday she was tired and went back to bed. On Sunday she did have some shortness of breath and nausea. She has not had much of an appetite. Family had noticed some off-the-wall comments and not remembering things she should. She states that she's had decreased urination. In the emergency room the patient was found to have a low sodium of 123 and a low potassium of 2.8. Hospitalist services were contacted for further evaluation.  PAST MEDICAL HISTORY:   Past Medical History  Diagnosis Date  . Crohn disease   . Trigger finger     PAST SURGICAL HISTORY:   Past Surgical History  Procedure Laterality Date  . Cholecystectomy  1992  . Tonsillectomy  1950  . Appendectomy  1949  . Eye surgery  2012, 2013    5 total  . Joint replacement Right 01-28-13    SOCIAL HISTORY:   History  Substance Use Topics  . Smoking status: Former Smoker -- 30 years    Quit date: 08/27/1990  . Smokeless tobacco: Not on file  . Alcohol Use: Yes     Comment: rarely    FAMILY HISTORY:   Family History  Problem Relation Age of Onset  . Stroke Father     DRUG ALLERGIES:  No Known Allergies  REVIEW OF SYSTEMS:  CONSTITUTIONAL: No fever. Positive for fatigue or weakness. Possible some chills and sweats. 4 pound weight loss. EYES: Wears glasses, cannot see very well out of  her left eye, needs a cataract procedure on her right eye. EARS, NOSE, AND THROAT: No tinnitus or ear pain. No sore throat. Positive for postnasal drip and drainage. Occasional pill dysphasia. RESPIRATORY: No cough, positive for shortness of breath on Sunday. No wheezing or hemoptysis.  CARDIOVASCULAR: No chest pain, orthopnea, edema.  GASTROINTESTINAL: Some nausea, no vomiting, chronic diarrhea , no abdominal pain. No blood in bowel movements. GENITOURINARY: Decreased urination. No dysuria, hematuria.  ENDOCRINE: No polyuria, nocturia,  HEMATOLOGY: No anemia, easy bruising or bleeding SKIN: No rash or lesion. Positive for occasional itching. MUSCULOSKELETAL: Positive for joint pains and muscle pain   NEUROLOGIC: No tingling, numbness, weakness.  PSYCHIATRY: No anxiety or depression.   MEDICATIONS AT HOME:   Prior to Admission medications   Medication Sig Start Date End Date Taking? Authorizing Provider  alendronate (FOSAMAX) 70 MG tablet Take 70 mg by mouth once a week.  12/22/14  Yes Historical Provider, MD  aspirin 81 MG tablet Take 81 mg by mouth daily.   Yes Historical Provider, MD  budesonide (ENTOCORT EC) 3 MG 24 hr capsule Take 6 mg by mouth daily. Take 2 capsules every other day alternating with 1 capsule every other day. 02/11/15  Yes Historical Provider, MD  Calcium Carb-Cholecalciferol (CALCIUM 600 + D PO) Take 3 tablets by mouth daily.   Yes Historical  Provider, MD  Cyanocobalamin (VITAMIN B-12 IJ) Inject as directed every 30 (thirty) days.   Yes Historical Provider, MD  DHA-EPA-Vitamin E (OMEGA-3 COMPLEX PO) Take 2 tablets by mouth daily.   Yes Historical Provider, MD  FERROUS SULFATE DRIED PO Take 100 mg by mouth daily.   Yes Historical Provider, MD  KLOR-CON M20 20 MEQ tablet Take 40 mEq by mouth daily.  11/04/13  Yes Historical Provider, MD  Magnesium 400 MG CAPS Take 1 tablet by mouth 3 (three) times daily.    Yes Historical Provider, MD  Multiple Vitamin (MULTIVITAMIN)  capsule Take 1 capsule by mouth daily.   Yes Historical Provider, MD  NEXIUM 40 MG capsule Take 40 mg by mouth daily.  08/26/13  Yes Historical Provider, MD  PENTASA 500 MG CR capsule Take 1,000 mg by mouth 3 (three) times daily.  08/26/13  Yes Historical Provider, MD  prednisoLONE acetate (PRED MILD) 0.12 % ophthalmic suspension Place 1 drop into the left eye daily.   Yes Historical Provider, MD  Probiotic Product (PROBIOTIC PO) Take 1 capsule by mouth daily.   Yes Historical Provider, MD  triamterene-hydrochlorothiazide (DYAZIDE) 37.5-25 MG per capsule Take 1 capsule by mouth daily.  10/17/13  Yes Historical Provider, MD  vitamin C (ASCORBIC ACID) 500 MG tablet Take 500 mg by mouth daily.   Yes Historical Provider, MD  Vitamin D, Cholecalciferol, 1000 UNITS TABS Take 1 tablet by mouth daily.    Yes Historical Provider, MD  doxycycline (VIBRAMYCIN) 100 MG capsule Take 100 mg by mouth 2 (two) times daily.    Historical Provider, MD  TRICOR 145 MG tablet Take 145 mg by mouth daily.  08/26/13   Historical Provider, MD      VITAL SIGNS:  Blood pressure 121/71, pulse 83, temperature 97.7 F (36.5 C), temperature source Oral, resp. rate 19, height 5' (1.524 m), weight 68.04 kg (150 lb), SpO2 100 %.  PHYSICAL EXAMINATION:  GENERAL:  81 y.o.-year-old patient lying in the bed with no acute distress.  EYES: Pupils equal, round, reactive to light and accommodation. No scleral icterus. Extraocular muscles intact.  HEENT: Head atraumatic, normocephalic. Oropharynx and nasopharynx clear.  NECK:  Supple, no jugular venous distention. No thyroid enlargement, no tenderness.  LUNGS: Normal breath sounds bilaterally, no wheezing, rales,rhonchi or crepitation. No use of accessory muscles of respiration.  CARDIOVASCULAR: S1, S2 normal. No murmurs, rubs, or gallops.  ABDOMEN: Soft, nontender, nondistended. Bowel sounds present. No organomegaly or mass.  EXTREMITIES: No pedal edema, cyanosis, or clubbing.   NEUROLOGIC: Cranial nerves II through XII are intact. Muscle strength 5/5 in all extremities. Sensation intact. Gait not checked.  PSYCHIATRIC: The patient is alert and oriented x 3.  SKIN: No rash, lesion, or ulcer.   LABORATORY PANEL:   CBC  Recent Labs Lab 02/21/15 1718  WBC 9.6  HGB 10.8*  HCT 32.3*  PLT 506*   Chemistries   Recent Labs Lab 02/21/15 1718  NA 123*  K 2.8*  CL 91*  CO2 23  GLUCOSE 105*  BUN 16  CREATININE 0.69  CALCIUM 7.6*  MG 1.6*    Cardiac Enzymes  Recent Labs Lab 02/21/15 1718  TROPONINI <0.03    EKG:   Normal sinus rhythm 72 bpm, LVH, anterior lateral Q waves  IMPRESSION AND PLAN:   1. Acute Hyponatremia- probably a combination of taking hydrochlorothiazide and not eating very well with poor appetite. I will DC hydrochlorothiazide. This is not a good medication for this patient. I will give  gentle IV fluid hydration. Check sodium level in the a.m.  2. Hypokalemia and hypomagnesemia- replace magnesium orally and IV and recheck in a.m. Replace potassium in IV fluids and orally and recheck in a.m. Stopping hydrochlorothiazide will also help with electrolytes. 3. Crohn's disease with chronic diarrhea- Will hold off on further studies and continue her usual medications. 4. Gastroesophageal reflux disease without esophagitis continue PPI. 5. Anemia- I will also add on iron studies.  All the records are reviewed and case discussed with ED provider. Management plans discussed with the patient, family and they are in agreement.  CODE STATUS: Full code  TOTAL TIME TAKING CARE OF THIS PATIENT: 55 minutes.    Loletha Grayer M.D on 02/21/2015 at 7:47 PM  Between 7am to 6pm - Pager - [phone]  After 6pm call admission pager [facility]  Office  [phone]  CC: Primary care physician; Tracie Harrier, MD

## 2015-02-21 NOTE — ED Notes (Signed)
New bed assignment received.  Awaiting ready bed status to call report.

## 2015-02-21 NOTE — ED Provider Notes (Signed)
[facility] Emergency Department Provider Note  ____________________________________________  Time seen: Approximately 977 PM  I have reviewed the triage vital signs and the nursing notes.   HISTORY  Chief Complaint Weakness and Urinary Retention    HPI Joanne [name] is a 81 y.o. female with a history of Crohn's disease who presents with increased generalized weakness as well as intermittent confusion and body aches over the last week. Also with decreased appetite. Says that she has had several days of diarrhea as well. Denies any blood in her stool. Was seen earlier today at the [facility] and found to have metabolic derangements including low potassium and low sodium. She says that several years ago when she had similar metabolic derangements she had a syncopal episode. She has not had any syncopal episodes this time around. She denies any pain at this time. Is taking HCTZ but denies taking any more than her prescribed amount.   Past Medical History  Diagnosis Date  . Crohn disease     Patient Active Problem List   Diagnosis Date Noted  . Infected sebaceous cyst 11/10/2013    Past Surgical History  Procedure Laterality Date  . Cholecystectomy  1992  . Tonsillectomy  1950  . Appendectomy  1949  . Eye surgery  2012, 2013    5 total  . Joint replacement Right 01-28-13    Current Outpatient Rx  Name  Route  Sig  Dispense  Refill  . alendronate (FOSAMAX) 70 MG tablet   Oral   Take 70 mg by mouth once a week.          Marland Kitchen aspirin 81 MG tablet   Oral   Take 81 mg by mouth daily.         . budesonide (ENTOCORT EC) 3 MG 24 hr capsule   Oral   Take 3 mg by mouth daily.          . Calcium Carb-Cholecalciferol (CALCIUM 600 + D PO)   Oral   Take 3 tablets by mouth daily.         . Cyanocobalamin (VITAMIN B-12 IJ)   Injection   Inject as directed every 30 (thirty) days.         . DHA-EPA-Vitamin E (OMEGA-3 COMPLEX PO)   Oral   Take  2 tablets by mouth daily.         . Iron 66 MG TABS   Oral   Take 2 tablets by mouth daily.         Marland Kitchen KLOR-CON M20 20 MEQ tablet   Oral   Take 40 mEq by mouth daily.          . Magnesium 400 MG CAPS   Oral   Take 5 tablets by mouth daily.         . Multiple Vitamin (MULTIVITAMIN) capsule   Oral   Take 1 capsule by mouth daily.         Marland Kitchen NEXIUM 40 MG capsule   Oral   Take 40 mg by mouth daily.          Marland Kitchen PENTASA 500 MG CR capsule   Oral   Take 500 mg by mouth 3 (three) times daily.          Marland Kitchen triamterene-hydrochlorothiazide (DYAZIDE) 37.5-25 MG per capsule   Oral   Take 1 capsule by mouth daily.          . vitamin C (ASCORBIC ACID) 500 MG tablet  Oral   Take 500 mg by mouth daily.         . Vitamin D, Cholecalciferol, 1000 UNITS TABS   Oral   Take 2 tablets by mouth daily.         Marland Kitchen doxycycline (VIBRAMYCIN) 100 MG capsule   Oral   Take 100 mg by mouth 2 (two) times daily.         . TRICOR 145 MG tablet   Oral   Take 145 mg by mouth daily.            Allergies Review of patient's allergies indicates no known allergies.  No family history on file.  Social History History  Substance Use Topics  . Smoking status: Former Smoker -- 30 years    Quit date: 08/27/1990  . Smokeless tobacco: Not on file  . Alcohol Use: Yes     Comment: rarely    Review of Systems Constitutional: No fever/chills Eyes: No visual changes. ENT: No sore throat. Cardiovascular: Denies chest pain. Respiratory: Denies shortness of breath. Gastrointestinal: No abdominal pain.  No nausea, no vomiting.   No constipation. Genitourinary: Negative for dysuria. Musculoskeletal: Negative for back pain. Skin: Negative for rash. Neurological: Negative for headaches, focal weakness or numbness.  10-point ROS otherwise negative.  ____________________________________________   PHYSICAL EXAM:  VITAL SIGNS: ED Triage Vitals  Enc Vitals Group     BP 02/21/15  1648 133/85 mmHg     Pulse Rate 02/21/15 1648 70     Resp 02/21/15 1648 20     Temp 02/21/15 1648 97.7 F (36.5 C)     Temp Source 02/21/15 1648 Oral     SpO2 02/21/15 1648 100 %     Weight 02/21/15 1648 150 lb (68.04 kg)     Height 02/21/15 1648 5' 1"  (1.549 m)     Head Cir --      Peak Flow --      Pain Score --      Pain Loc --      Pain Edu? --      Excl. in [place]? --     Constitutional: Alert and oriented. Well appearing and in no acute distress. Eyes: Conjunctivae are normal. PERRL. EOMI. Head: Atraumatic. Nose: No congestion/rhinnorhea. Mouth/Throat: Mucous membranes are moist.  Oropharynx non-erythematous. Neck: No stridor.   Cardiovascular: Normal rate, regular rhythm. Grossly normal heart sounds.  Good peripheral circulation. Respiratory: Normal respiratory effort.  No retractions. Lungs CTAB. Gastrointestinal: Soft and nontender. No distention. No abdominal bruits. No CVA tenderness. Musculoskeletal: No lower extremity tenderness nor edema.  No joint effusions. Neurologic:  Normal speech and language. No gross focal neurologic deficits are appreciated. Speech is normal. No gait instability. Skin:  Skin is warm, dry and intact. No rash noted. Psychiatric: Mood and affect are normal. Speech and behavior are normal.  ____________________________________________   LABS (all labs ordered are listed, but only abnormal results are displayed)  Labs Reviewed  CBC - Abnormal; Notable for the following:    Hemoglobin 10.8 (*)    HCT 32.3 (*)    Platelets 506 (*)    All other components within normal limits  BASIC METABOLIC PANEL - Abnormal; Notable for the following:    Sodium 123 (*)    Potassium 2.8 (*)    Chloride 91 (*)    Glucose, Bld 105 (*)    Calcium 7.6 (*)    All other components within normal limits  TROPONIN I  CBG MONITORING, ED   ____________________________________________  EKG  ED ECG REPORT I, Doran Stabler, the attending physician,  personally viewed and interpreted this ECG.   Date: 02/21/2015  EKG Time: 1713  Rate: 72  Rhythm: normal sinus rhythm  Axis: Normal axis  Intervals:none  ST&T Change: No ST elevations or depressions. No abnormal T-wave inversions.    ____________________________________________  RADIOLOGY   ____________________________________________   PROCEDURES    ____________________________________________   INITIAL IMPRESSION / ASSESSMENT AND PLAN / ED COURSE  Pertinent labs & imaging results that were available during my care of the patient were reviewed by me and considered in my medical decision making (see chart for details).  ----------------------------------------- 7:18 PM on 02/21/2015 -----------------------------------------  Patient resting comfortably. We'll replete potassium by mouth. We'll need to be admitted for normalization of sodium. Further workup and likely needed to determine cause of hyponatremia. Signed out to Dr.Weiting.   ____________________________________________   FINAL CLINICAL IMPRESSION(S) / ED DIAGNOSES  Generalized weakness with hyponatremia and hypokalemia. Initial visit.    Orbie Pyo, MD 02/21/15 [phone]

## 2015-02-22 LAB — BASIC METABOLIC PANEL
ANION GAP: 4 — AB (ref 5–15)
BUN: 12 mg/dL (ref 6–20)
CHLORIDE: 103 mmol/L (ref 101–111)
CO2: 25 mmol/L (ref 22–32)
CREATININE: 0.65 mg/dL (ref 0.44–1.00)
Calcium: 7.6 mg/dL — ABNORMAL LOW (ref 8.9–10.3)
GFR calc Af Amer: >60 mL/min (ref >60)
GFR calc non Af Amer: >60 mL/min (ref >60)
Glucose, Bld: 99 mg/dL (ref 65–99)
Potassium: 3 mmol/L — ABNORMAL LOW (ref 3.5–5.1)
Sodium: 132 mmol/L — ABNORMAL LOW (ref 135–145)

## 2015-02-22 LAB — CBC
HEMATOCRIT: 29.8 % — AB (ref 35.0–47.0)
HEMOGLOBIN: 10.1 g/dL — AB (ref 12.0–16.0)
MCH: 29 pg (ref 26.0–34.0)
MCHC: 34.1 g/dL (ref 32.0–36.0)
MCV: 85.1 fL (ref 80.0–100.0)
Platelets: 484 10*3/uL — ABNORMAL HIGH (ref 150–440)
RBC: 3.5 MIL/uL — AB (ref 3.80–5.20)
RDW: 14.6 % — ABNORMAL HIGH (ref 11.5–14.5)
WBC: 9.2 10*3/uL (ref 3.6–11.0)

## 2015-02-22 LAB — MAGNESIUM: MAGNESIUM: 2.4 mg/dL (ref 1.7–2.4)

## 2015-02-22 MED ORDER — PREDNISOLONE ACETATE 0.12 % OP SUSP
1.0000 [drp] | Freq: Every day | OPHTHALMIC | Status: DC
  Administered 2015-02-22: 1 [drp] via OPHTHALMIC
  Filled 2015-02-22: qty 5

## 2015-02-22 MED ORDER — POTASSIUM CHLORIDE 20 MEQ PO PACK
20.0000 meq | PACK | Freq: Once | ORAL | Status: AC
  Administered 2015-02-22: 20 meq via ORAL
  Filled 2015-02-22: qty 1

## 2015-02-22 NOTE — Progress Notes (Signed)
   02/22/15 1330  Clinical Encounter Type  Visited With Patient;Patient and family together  Visit Type Initial  Visited with patient and her granddaughter.  Pt was very pleasant and calm.  She told me she made a deal with her doctor to go home tomorrow and is hoping to be able to do so. She said she is feeling much better.  I enjoyed speaking with this patient.  She told me she enjoyed speaking with me as well and thanked me very much for visiting with her.  [place] [phone]

## 2015-02-22 NOTE — Care Management (Signed)
Admitted to this facility with the diagnosis of hyponatremia. Usually lives by herself, but granddaughter, her husband, children and dog are living with her until they can find another home. Daughters are Elgie Collard [phone] and Clement Husbands [phone]). See Dr. Ginette Pitman. Last in office April 25 th. [facility] 2 years ago following hip surgery. Followed by Home Health/Physical therapy after being discharged from [facility]. Doesn't remember name of agency. Has a cane/walker, but does use them. Takes care of all [place] activities of daily living, but daughters have to take care of some of the instrumental activities.  She does not drive. Daughters will transport. Sodium level 134 today. Potassium level 3.0 Shelbie Ammons RN MSN Care Management [phone]

## 2015-02-22 NOTE — Plan of Care (Signed)
Problem: Discharge Progression Outcomes Goal: Discharge plan in place and appropriate Individualization: Pt prefers to be called Joanne [name] who lives at home alone with lots of support from her daughter.  Hx Crohn's disease controlled by home medications.  High fall risk due to generalized weakness. Pt shows understanding of how to use call system for any assistance. Goal: Other Discharge Outcomes/Goals 1. No c/o pain. Pt resting throughout the night. 2. Hemodynamically:              -VSS, afebrile, IVF infusing w/ K supplemented             -Electrolytes replaced tonight (Mag/Na/K) w/ labs scheduled for this morning to recheck 3. Generalized weakness requiring +1 assist out of bed.

## 2015-02-22 NOTE — Progress Notes (Signed)
PROGRESS NOTE  LAQUITA HARLAN [id number]:[id number] DOB: [birth year] DOA: 02/21/2015 PCP: Tracie Harrier, MD  Subjective:   81 y/o f with hx of Croh'ns disease admitted with hyponatremia and Hypokalemia. Pt had been having diarrhea for  a few days prior to this and also  reported decreased urination   Objective: BP 126/47 mmHg  Pulse 71  Temp(Src) 98.4 F (36.9 C) (Oral)  Resp 18  Ht 5' (1.524 m)  Wt 67.314 kg (148 lb 6.4 oz)  BMI 28.98 kg/m2  SpO2 96%  Intake/Output Summary (Last 24 hours) at 02/22/15 1245 Last data filed at 02/21/15 2315  Gross per 24 hour  Intake      0 ml  Output    425 ml  Net   -425 ml   Filed Weights   02/21/15 1701 02/21/15 2230 02/22/15 0531  Weight: 68.04 kg (150 lb) 67.314 kg (148 lb 6.4 oz) 67.314 kg (148 lb 6.4 oz)    Exam: HEENT: [place] AT  General:  Not in distress  Cardiovascular: S1 S2  Respiratory: Clear to auscultation  Abdomen: Soft, Non tender. BS 1 +  Neuro:Non Focal   Data Reviewed: Basic Metabolic Panel:  Recent Labs Lab 02/21/15 1718 02/22/15 0430  NA 123* 132*  K 2.8* 3.0*  CL 91* 103  CO2 23 25  GLUCOSE 105* 99  BUN 16 12  CREATININE 0.69 0.65  CALCIUM 7.6* 7.6*  MG 1.6* 2.4   Liver Function Tests: No results for input(s): AST, ALT, ALKPHOS, BILITOT, PROT, ALBUMIN in the last 168 hours. No results for input(s): LIPASE, AMYLASE in the last 168 hours. No results for input(s): AMMONIA in the last 168 hours. CBC:  Recent Labs Lab 02/21/15 1718 02/22/15 0430  WBC 9.6 9.2  HGB 10.8* 10.1*  HCT 32.3* 29.8*  MCV 84.9 85.1  PLT 506* 484*   Cardiac Enzymes:    Recent Labs Lab 02/21/15 1718  TROPONINI <0.03   BNP (last 3 results) No results for input(s): BNP in the last 8760 hours.  ProBNP (last 3 results) No results for input(s): PROBNP in the last 8760 hours.  CBG: No results for input(s): GLUCAP in the last 168 hours.  No results found for this or any previous visit (from the past 240 hour(s)).    Studies: No results found.  Scheduled Meds: . acidophilus  1 capsule Oral Daily  . aspirin  81 mg Oral Daily  . budesonide  6 mg Oral Daily  . cholecalciferol  1,000 Units Oral Daily  . enoxaparin (LOVENOX) injection  40 mg Subcutaneous Q24H  . fenofibrate  54 mg Oral Daily  . ferrous sulfate  325 mg Oral Q breakfast  . magnesium oxide  400 mg Oral TID  . mesalamine  1,000 mg Oral TID  . multivitamin with minerals  1 tablet Oral Daily  . pantoprazole  40 mg Oral Daily  . potassium chloride SA  40 mEq Oral Daily  . prednisoLONE acetate  1 drop Left Eye Daily  . sodium chloride  3 mL Intravenous Q12H  . vitamin C  500 mg Oral Daily    Continuous Infusions: . 0.9 % NaCl with KCl 20 mEq / L 75 mL/hr at 02/21/15 1958    Assessment/Plan: 1 Acute Hyponatremia secondary to diarrhea. May also be related tp Diuretic thearpy 2 Hypokalemia and Hypomagnesemia : Continue supplements  Monitor electrolytes closely 3 Crohn;s disease:On Entocort and Pentasa  4 GERD; On PPI 5 Physical Therapy   Code Status: Full  Kehinde Totzke   02/22/2015, 12:45 PM  LOS: 1 day

## 2015-02-23 LAB — CBC WITH DIFFERENTIAL/PLATELET
BASOS ABS: 0.1 10*3/uL (ref 0–0.1)
Basophils Relative: 1 %
EOS ABS: 0.3 10*3/uL (ref 0–0.7)
Eosinophils Relative: 3 %
HEMATOCRIT: 27.5 % — AB (ref 35.0–47.0)
HEMOGLOBIN: 9.8 g/dL — AB (ref 12.0–16.0)
LYMPHS ABS: 2.2 10*3/uL (ref 1.0–3.6)
Lymphocytes Relative: 22 %
MCH: 30.2 pg (ref 26.0–34.0)
MCHC: 35.5 g/dL (ref 32.0–36.0)
MCV: 84.9 fL (ref 80.0–100.0)
MONOS PCT: 8 %
Monocytes Absolute: 0.8 10*3/uL (ref 0.2–0.9)
Neutro Abs: 6.4 10*3/uL (ref 1.4–6.5)
Neutrophils Relative %: 66 %
PLATELETS: 418 10*3/uL (ref 150–440)
RBC: 3.23 MIL/uL — AB (ref 3.80–5.20)
RDW: 14.5 % (ref 11.5–14.5)
WBC: 9.6 10*3/uL (ref 3.6–11.0)

## 2015-02-23 LAB — BASIC METABOLIC PANEL
Anion gap: 4 — ABNORMAL LOW (ref 5–15)
BUN: 12 mg/dL (ref 6–20)
CALCIUM: 7.4 mg/dL — AB (ref 8.9–10.3)
CO2: 25 mmol/L (ref 22–32)
CREATININE: 0.63 mg/dL (ref 0.44–1.00)
Chloride: 108 mmol/L (ref 101–111)
GLUCOSE: 98 mg/dL (ref 65–99)
Potassium: 3.7 mmol/L (ref 3.5–5.1)
Sodium: 137 mmol/L (ref 135–145)

## 2015-02-23 MED ORDER — POTASSIUM CHLORIDE ER 10 MEQ PO TBCR: 10.0000 meq | EXTENDED_RELEASE_TABLET | Freq: Every day | ORAL | Status: DC

## 2015-02-23 NOTE — Progress Notes (Signed)
Discharge instructions given with prescriptions for potassium and lab work-IV discontinued-waiting on ride from daughter

## 2015-02-23 NOTE — Discharge Instructions (Signed)

## 2015-02-23 NOTE — Care Management (Signed)
Important Message  Patient Details  Name: Joanne [name] MRN: [id number] Date of Birth: Jun 01, 1931   Medicare Important Message Given:  Yes-second notification given    [name] 02/23/2015, 1:01 PM

## 2015-02-23 NOTE — Progress Notes (Signed)
Assisted to car via wheelchair-daughter taking patient home

## 2015-02-23 NOTE — Progress Notes (Signed)
PROGRESS NOTE  Joanne [name] [id number]:[id number] DOB: 1931-05-24 DOA: 02/21/2015 PCP: Tracie Harrier, MD  Subjective:   81 y/o f with hx of Croh'ns disease admitted with hyponatremia and Hypokalemia. Pt had been having diarrhea for  a few days prior to this and also  reported decreased urination This am feels better, but drowsy  Objective: BP 143/43 mmHg  Pulse 78  Temp(Src) 98.6 F (37 C) (Oral)  Resp 20  Ht 5' (1.524 m)  Wt 67.314 kg (148 lb 6.4 oz)  BMI 28.98 kg/m2  SpO2 99%  Intake/Output Summary (Last 24 hours) at 02/23/15 0823 Last data filed at 02/23/15 0407  Gross per 24 hour  Intake    240 ml  Output   1400 ml  Net  -1160 ml   Filed Weights   02/21/15 1701 02/21/15 2230 02/22/15 0531  Weight: 68.04 kg (150 lb) 67.314 kg (148 lb 6.4 oz) 67.314 kg (148 lb 6.4 oz)    Exam: HEENT: [place] AT  General:  Not in distress  Cardiovascular: S1 S2  Respiratory: Clear to auscultation  Abdomen: Soft, Non tender. BS 1 +  Neuro:Non Focal   Data Reviewed: Basic Metabolic Panel:  Recent Labs Lab 02/21/15 1718 02/22/15 0430 02/23/15 0450  NA 123* 132* 137  K 2.8* 3.0* 3.7  CL 91* 103 108  CO2 23 25 25   GLUCOSE 105* 99 98  BUN 16 12 12   CREATININE 0.69 0.65 0.63  CALCIUM 7.6* 7.6* 7.4*  MG 1.6* 2.4  --    Liver Function Tests: No results for input(s): AST, ALT, ALKPHOS, BILITOT, PROT, ALBUMIN in the last 168 hours. No results for input(s): LIPASE, AMYLASE in the last 168 hours. No results for input(s): AMMONIA in the last 168 hours. CBC:  Recent Labs Lab 02/21/15 1718 02/22/15 0430 02/23/15 0450  WBC 9.6 9.2 9.6  NEUTROABS  --   --  6.4  HGB 10.8* 10.1* 9.8*  HCT 32.3* 29.8* 27.5*  MCV 84.9 85.1 84.9  PLT 506* 484* 418   Cardiac Enzymes:    Recent Labs Lab 02/21/15 1718  TROPONINI <0.03   BNP (last 3 results) No results for input(s): BNP in the last 8760 hours.  ProBNP (last 3 results) No results for input(s): PROBNP in the last 8760  hours.  CBG: No results for input(s): GLUCAP in the last 168 hours.  No results found for this or any previous visit (from the past 240 hour(s)).   Studies: No results found.  Scheduled Meds: . acidophilus  1 capsule Oral Daily  . aspirin  81 mg Oral Daily  . budesonide  6 mg Oral Daily  . cholecalciferol  1,000 Units Oral Daily  . enoxaparin (LOVENOX) injection  40 mg Subcutaneous Q24H  . fenofibrate  54 mg Oral Daily  . ferrous sulfate  325 mg Oral Q breakfast  . magnesium oxide  400 mg Oral TID  . mesalamine  1,000 mg Oral TID  . multivitamin with minerals  1 tablet Oral Daily  . pantoprazole  40 mg Oral Daily  . potassium chloride SA  40 mEq Oral Daily  . prednisoLONE acetate  1 drop Left Eye Daily  . sodium chloride  3 mL Intravenous Q12H  . vitamin C  500 mg Oral Daily    Continuous Infusions: . 0.9 % NaCl with KCl 20 mEq / L 75 mL/hr at 02/23/15 0307    Assessment/Plan: 1 Acute Hyponatremia secondary to diarrhea. May also be related tp Diuretic thearpy Se Sodium  improved to 137 2 Hypokalemia and Hypomagnesemia : Continue supplements  K ; 3.7 3 Crohn;s disease:On Entocort and Pentasa  4 GERD; On PPI 5 D/c Later today  Code Status: Full        Joanne [name]   02/23/2015, 8:23 AM  LOS: 2 days

## 2015-02-23 NOTE — Discharge Summary (Signed)
Physician Discharge Summary  Joanne [name]:[id number] DOB: August 10, 1931 DOA: 02/21/2015  PCP: Tracie Harrier, MD  Admit date: 02/21/2015 Discharge date: 02/23/2015  Time spent: 35 minutes  Recommendations for Outpatient Follow-up:  1. Follow up with Dr. Ginette Pitman in 1-2 weeks   Discharge Diagnoses:   1  Hyponatremia 2 Hypokalemia  3 Hx of Crohn's disease   Discharge Condition: Stable   Filed Weights   02/21/15 1701 02/21/15 2230 02/22/15 0531  Weight: 68.04 kg (150 lb) 67.314 kg (148 lb 6.4 oz) 67.314 kg (148 lb 6.4 oz)    History of present illness:  Joanne [name] is a 81 year old female with a known history of Crohn's disease, who initially presented to the [facility] clinic complaining of decreased urination patient had also had an episode of diarrhea a few days prior. Patient was noted to be hypokalemic with a potassium of 2.8 and hyponatremic with a sodium 123   Hospital Course:  She was admitted [facility] and received IV fluids her Triamterene HCTZ was held she also received replacement therapy with both potassium as well as magnesium. His serum sodium levels gradually improved to 137 and se Potassium  also improved 3.7. Patient's mental status also improved she was seen by physical therapy and she was allowed home in stable condition Her blood pressure was stable at time of discharge she was advised follow me Dr. Ginette Pitman in 1- 2 weeks' time    Discharge Exam: Filed Vitals:   02/23/15 1221  BP: 127/57  Pulse: 84  Temp: 97.5 F (36.4 C)  Resp: 18    General:Not in distress Cardiovascular: S1 S2  Respiratory: Clear to auscultation. Abdomen: Soft. Non tender  Discharge Instructions    Current Discharge Medication List    START taking these medications   Details  potassium chloride (KLOR-CON 10) 10 MEQ tablet Take 1 tablet (10 mEq total) by mouth daily. Qty: 30 tablet, Refills: 3      CONTINUE these medications which have NOT CHANGED    Details  alendronate (FOSAMAX) 70 MG tablet Take 70 mg by mouth once a week.     aspirin 81 MG tablet Take 81 mg by mouth daily.    budesonide (ENTOCORT EC) 3 MG 24 hr capsule Take 3 mg by mouth daily. Takes 1 capsule daily    Calcium Carb-Cholecalciferol (CALCIUM 600 + D PO) Take 3 tablets by mouth daily.    FERROUS SULFATE DRIED PO Take 100 mg by mouth daily.    Multiple Vitamin (MULTIVITAMIN) capsule Take 1 capsule by mouth daily.    NEXIUM 40 MG capsule Take 40 mg by mouth daily.     PENTASA 500 MG CR capsule Take 1,000 mg by mouth 3 (three) times daily.     prednisoLONE acetate (PRED MILD) 0.12 % ophthalmic suspension Place 1 drop into the left eye daily.    triamterene-hydrochlorothiazide (DYAZIDE) 37.5-25 MG per capsule Take 1 capsule by mouth daily.     Vitamin D, Cholecalciferol, 1000 UNITS TABS Take 1 tablet by mouth daily.     Cyanocobalamin (VITAMIN B-12 IJ) Inject as directed every 30 (thirty) days.    DHA-EPA-Vitamin E (OMEGA-3 COMPLEX PO) Take 2 tablets by mouth daily.    Magnesium 400 MG CAPS Take 1 tablet by mouth 3 (three) times daily.     Probiotic Product (PROBIOTIC PO) Take 1 capsule by mouth daily.    TRICOR 145 MG tablet Take 145 mg by mouth daily.     vitamin C (ASCORBIC ACID) 500  MG tablet Take 500 mg by mouth daily.      STOP taking these medications     KLOR-CON M20 20 MEQ tablet      Magnesium 500 MG TABS      doxycycline (VIBRAMYCIN) 100 MG capsule        No Known Allergies    The results of significant diagnostics from this hospitalization (including imaging, microbiology, ancillary and laboratory) are listed below for reference.    Significant Diagnostic Studies: No results found.  Microbiology: No results found for this or any previous visit (from the past 240 hour(s)).   Labs: Basic Metabolic Panel:  Recent Labs Lab 02/21/15 1718 02/22/15 0430 02/23/15 0450  NA 123* 132* 137  K 2.8* 3.0* 3.7  CL 91* 103 108  CO2  23 25 25   GLUCOSE 105* 99 98  BUN 16 12 12   CREATININE 0.69 0.65 0.63  CALCIUM 7.6* 7.6* 7.4*  MG 1.6* 2.4  --    Liver Function Tests: No results for input(s): AST, ALT, ALKPHOS, BILITOT, PROT, ALBUMIN in the last 168 hours. No results for input(s): LIPASE, AMYLASE in the last 168 hours. No results for input(s): AMMONIA in the last 168 hours. CBC:  Recent Labs Lab 02/21/15 1718 02/22/15 0430 02/23/15 0450  WBC 9.6 9.2 9.6  NEUTROABS  --   --  6.4  HGB 10.8* 10.1* 9.8*  HCT 32.3* 29.8* 27.5*  MCV 84.9 85.1 84.9  PLT 506* 484* 418   Cardiac Enzymes:  Recent Labs Lab 02/21/15 1718  TROPONINI <0.03   BNP: BNP (last 3 results) No results for input(s): BNP in the last 8760 hours.  ProBNP (last 3 results) No results for input(s): PROBNP in the last 8760 hours.  CBG: No results for input(s): GLUCAP in the last 168 hours.     SignedTracie Harrier   02/23/2015, 12:53 PM

## 2015-02-23 NOTE — Plan of Care (Signed)
Problem: Discharge Progression Outcomes Goal: Discharge plan in place and appropriate Individualization:  Outcome: Progressing Pt prefers to be called Joanne [name] who lives at home alone with lots of support from her daughter.   Hx Crohn's disease controlled by home medications.   High fall risk due to generalized weakness. Pt shows understanding of how to use call system for any assistance. Goal: Pain controlled with appropriate interventions Outcome: Completed/Met Date Met:  02/23/15 Pt complains of no pain. Goal: Other Discharge Outcomes/Goals Outcome: Progressing Plan of care progress to goal: Pain - pt complains of no pain Hemodynamically stable - vitals stable, BP runs a little elevated Diet - tolerating Activity - frequent bathroom visits, but calls before trying to get up on her own.

## 2015-05-03 DIAGNOSIS — Z8679 Personal history of other diseases of the circulatory system: Secondary | ICD-10-CM | POA: Insufficient documentation

## 2015-05-10 HISTORY — PX: CATARACT EXTRACTION EXTRACAPSULAR: SHX1305

## 2016-08-06 ENCOUNTER — Other Ambulatory Visit: Payer: Self-pay | Admitting: Nurse Practitioner

## 2016-08-06 DIAGNOSIS — R1310 Dysphagia, unspecified: Secondary | ICD-10-CM

## 2016-08-10 ENCOUNTER — Ambulatory Visit
Admission: RE | Admit: 2016-08-10 | Discharge: 2016-08-10 | Disposition: A | Discharge status: Home / Self Care | Payer: Medicare Other | Source: Ambulatory Visit | Attending: Nurse Practitioner | Admitting: Nurse Practitioner

## 2016-08-10 DIAGNOSIS — K225 Diverticulum of esophagus, acquired: Secondary | ICD-10-CM | POA: Diagnosis not present

## 2016-08-10 DIAGNOSIS — R1310 Dysphagia, unspecified: Secondary | ICD-10-CM | POA: Diagnosis not present

## 2016-09-25 DIAGNOSIS — K225 Diverticulum of esophagus, acquired: Secondary | ICD-10-CM | POA: Insufficient documentation

## 2016-09-25 HISTORY — DX: Diverticulum of esophagus, acquired: K22.5

## 2017-01-09 ENCOUNTER — Telehealth: Payer: Self-pay | Admitting: General Surgery

## 2017-01-09 NOTE — Telephone Encounter (Signed)
OK 

## 2017-01-09 NOTE — Telephone Encounter (Signed)
PATIENT CALLED TODAY TO MAKE AN APPOINTMENT TO HAVE A NODULE REMOVED FROM HER RT UPPER BACK(SHOULDER AREA)STATES THE PAIN RUNS DOWN ARM & BACK.IN THE PAST SHE SAW DERMATOLOGY FOR THIS PLACE & THEY SQUEEZED THE AREA & IT RESOLVED.NOW IT HAS RETURNED.SHE STATED LAST YEAR YOU REMOVED AN AREA OFF FROM UNDER HER BREAST. MAY I MAKE AN APPOINTMENT FOR THIS PATIENT? PLEASE ADVISE. IF SHE HASN'T HEARD ANYTHING BY 3:00 ON 01-10-17 SHE'S GOING TO CALL BACK.

## 2017-01-10 ENCOUNTER — Telehealth: Payer: Self-pay | Admitting: General Surgery

## 2017-01-10 NOTE — Telephone Encounter (Signed)
Left message for patient to call & make an appointment with Dr Ala Bent NODULE REMOVED FROM HER RT UPPER BACK(SHOULDER AREA)STATES THE PAIN RUNS DOWN ARM & BACK.IN THE PAST SHE SAW DERMATOLOGY FOR THIS PLACE & THEY SQUEEZED THE AREA & IT RESOLVED.NOW IT HAS RETURNED) Dr Bary Castilla oked appt/MTH

## 2017-01-14 ENCOUNTER — Ambulatory Visit: Payer: Self-pay | Admitting: General Surgery

## 2017-01-22 ENCOUNTER — Encounter: Payer: Self-pay | Admitting: Emergency Medicine

## 2017-01-22 ENCOUNTER — Observation Stay
Admission: EM | Admit: 2017-01-22 | Discharge: 2017-01-23 | Disposition: A | Discharge status: Home / Self Care | Payer: Medicare Other | Attending: Internal Medicine | Admitting: Internal Medicine

## 2017-01-22 DIAGNOSIS — Z79899 Other long term (current) drug therapy: Secondary | ICD-10-CM | POA: Insufficient documentation

## 2017-01-22 DIAGNOSIS — E860 Dehydration: Secondary | ICD-10-CM | POA: Diagnosis not present

## 2017-01-22 DIAGNOSIS — Z9119 Patient's noncompliance with other medical treatment and regimen: Secondary | ICD-10-CM | POA: Insufficient documentation

## 2017-01-22 DIAGNOSIS — Z7982 Long term (current) use of aspirin: Secondary | ICD-10-CM | POA: Insufficient documentation

## 2017-01-22 DIAGNOSIS — Z823 Family history of stroke: Secondary | ICD-10-CM | POA: Diagnosis not present

## 2017-01-22 DIAGNOSIS — E878 Other disorders of electrolyte and fluid balance, not elsewhere classified: Secondary | ICD-10-CM | POA: Diagnosis not present

## 2017-01-22 DIAGNOSIS — I10 Essential (primary) hypertension: Secondary | ICD-10-CM | POA: Diagnosis not present

## 2017-01-22 DIAGNOSIS — Z8249 Family history of ischemic heart disease and other diseases of the circulatory system: Secondary | ICD-10-CM | POA: Diagnosis not present

## 2017-01-22 DIAGNOSIS — Z87891 Personal history of nicotine dependence: Secondary | ICD-10-CM | POA: Insufficient documentation

## 2017-01-22 DIAGNOSIS — E876 Hypokalemia: Secondary | ICD-10-CM | POA: Diagnosis not present

## 2017-01-22 DIAGNOSIS — N179 Acute kidney failure, unspecified: Secondary | ICD-10-CM | POA: Insufficient documentation

## 2017-01-22 DIAGNOSIS — I491 Atrial premature depolarization: Secondary | ICD-10-CM | POA: Insufficient documentation

## 2017-01-22 DIAGNOSIS — L02212 Cutaneous abscess of back [any part, except buttock]: Secondary | ICD-10-CM | POA: Diagnosis not present

## 2017-01-22 DIAGNOSIS — K5090 Crohn's disease, unspecified, without complications: Secondary | ICD-10-CM | POA: Insufficient documentation

## 2017-01-22 DIAGNOSIS — L723 Sebaceous cyst: Secondary | ICD-10-CM | POA: Diagnosis not present

## 2017-01-22 DIAGNOSIS — K225 Diverticulum of esophagus, acquired: Secondary | ICD-10-CM | POA: Diagnosis not present

## 2017-01-22 DIAGNOSIS — R531 Weakness: Secondary | ICD-10-CM

## 2017-01-22 HISTORY — DX: Hypokalemia: E87.6

## 2017-01-22 HISTORY — DX: Diverticulum of esophagus, acquired: K22.5

## 2017-01-22 HISTORY — DX: Hypomagnesemia: E83.42

## 2017-01-22 HISTORY — DX: Essential (primary) hypertension: I10

## 2017-01-22 LAB — CBC WITH DIFFERENTIAL/PLATELET
BASOS ABS: 0.2 10*3/uL — AB (ref 0–0.1)
Basophils Relative: 1 %
EOS PCT: 2 %
Eosinophils Absolute: 0.2 10*3/uL (ref 0–0.7)
HCT: 36.9 % (ref 35.0–47.0)
Hemoglobin: 12.9 g/dL (ref 12.0–16.0)
Lymphocytes Relative: 25 %
Lymphs Abs: 3 10*3/uL (ref 1.0–3.6)
MCH: 29.1 pg (ref 26.0–34.0)
MCHC: 35 g/dL (ref 32.0–36.0)
MCV: 83.3 fL (ref 80.0–100.0)
Monocytes Absolute: 0.9 10*3/uL (ref 0.2–0.9)
Monocytes Relative: 7 %
Neutro Abs: 8.2 10*3/uL — ABNORMAL HIGH (ref 1.4–6.5)
Neutrophils Relative %: 65 %
PLATELETS: 618 10*3/uL — AB (ref 150–440)
RBC: 4.43 MIL/uL (ref 3.80–5.20)
RDW: 14.6 % — AB (ref 11.5–14.5)
WBC: 12.5 10*3/uL — ABNORMAL HIGH (ref 3.6–11.0)

## 2017-01-22 LAB — COMPREHENSIVE METABOLIC PANEL
ALT: 26 U/L (ref 14–54)
AST: 36 U/L (ref 15–41)
Albumin: 3.6 g/dL (ref 3.5–5.0)
Alkaline Phosphatase: 74 U/L (ref 38–126)
Anion gap: 15 (ref 5–15)
BUN: 74 mg/dL — AB (ref 6–20)
CHLORIDE: 96 mmol/L — AB (ref 101–111)
CO2: 20 mmol/L — ABNORMAL LOW (ref 22–32)
Calcium: 9.3 mg/dL (ref 8.9–10.3)
Creatinine, Ser: 1.72 mg/dL — ABNORMAL HIGH (ref 0.44–1.00)
GFR, EST AFRICAN AMERICAN: 30 mL/min — AB (ref >60)
GFR, EST NON AFRICAN AMERICAN: 26 mL/min — AB (ref >60)
Glucose, Bld: 138 mg/dL — ABNORMAL HIGH (ref 65–99)
POTASSIUM: 2.1 mmol/L — AB (ref 3.5–5.1)
Sodium: 131 mmol/L — ABNORMAL LOW (ref 135–145)
Total Bilirubin: 0.6 mg/dL (ref 0.3–1.2)
Total Protein: 8.1 g/dL (ref 6.5–8.1)

## 2017-01-22 LAB — TROPONIN I

## 2017-01-22 LAB — MAGNESIUM: MAGNESIUM: 0.7 mg/dL — AB (ref 1.7–2.4)

## 2017-01-22 LAB — GLUCOSE, CAPILLARY: GLUCOSE-CAPILLARY: 131 mg/dL — AB (ref 65–99)

## 2017-01-22 MED ORDER — ACETAMINOPHEN 325 MG PO TABS: 650.0000 mg | ORAL_TABLET | Freq: Four times a day (QID) | ORAL | Status: DC | PRN

## 2017-01-22 MED ORDER — ENOXAPARIN SODIUM 30 MG/0.3ML ~~LOC~~ SOLN
30.0000 mg | SUBCUTANEOUS | Status: DC
  Administered 2017-01-22: 30 mg via SUBCUTANEOUS
  Filled 2017-01-22: qty 0.3

## 2017-01-22 MED ORDER — ENOXAPARIN SODIUM 40 MG/0.4ML ~~LOC~~ SOLN
30.0000 mg | SUBCUTANEOUS | Status: DC
  Filled 2017-01-22 (×2): qty 0.4

## 2017-01-22 MED ORDER — POTASSIUM CHLORIDE 20 MEQ PO PACK
40.0000 meq | PACK | Freq: Two times a day (BID) | ORAL | Status: DC
  Administered 2017-01-22: 40 meq via ORAL

## 2017-01-22 MED ORDER — MAGNESIUM SULFATE 2 GM/50ML IV SOLN
2.0000 g | Freq: Once | INTRAVENOUS | Status: AC
  Administered 2017-01-22: 2 g via INTRAVENOUS
  Filled 2017-01-22: qty 50

## 2017-01-22 MED ORDER — POTASSIUM CHLORIDE CRYS ER 20 MEQ PO TBCR
40.0000 meq | EXTENDED_RELEASE_TABLET | Freq: Once | ORAL | Status: DC
  Filled 2017-01-22: qty 2

## 2017-01-22 MED ORDER — LIDOCAINE HCL (PF) 1 % IJ SOLN
INTRAMUSCULAR | Status: AC
  Administered 2017-01-22: 13:00:00
  Filled 2017-01-22: qty 5

## 2017-01-22 MED ORDER — ONDANSETRON HCL 4 MG PO TABS: 4.0000 mg | ORAL_TABLET | Freq: Four times a day (QID) | ORAL | Status: DC | PRN

## 2017-01-22 MED ORDER — POTASSIUM CHLORIDE 20 MEQ PO PACK
40.0000 meq | PACK | ORAL | Status: AC
  Administered 2017-01-22 (×2): 40 meq via ORAL
  Filled 2017-01-22 (×2): qty 2

## 2017-01-22 MED ORDER — ACETAMINOPHEN 650 MG RE SUPP: 650.0000 mg | Freq: Four times a day (QID) | RECTAL | Status: DC | PRN

## 2017-01-22 MED ORDER — SODIUM CHLORIDE 0.9 % IV SOLN
1000.0000 mL | Freq: Once | INTRAVENOUS | Status: AC
  Administered 2017-01-22: 1000 mL via INTRAVENOUS

## 2017-01-22 MED ORDER — POLYETHYLENE GLYCOL 3350 17 G PO PACK: 17.0000 g | PACK | Freq: Every day | ORAL | Status: DC | PRN

## 2017-01-22 MED ORDER — ONDANSETRON HCL 4 MG/2ML IJ SOLN: 4.0000 mg | Freq: Four times a day (QID) | INTRAMUSCULAR | Status: DC | PRN

## 2017-01-22 MED ORDER — POTASSIUM CHLORIDE 20 MEQ PO PACK
PACK | ORAL | Status: AC
  Administered 2017-01-22: 40 meq via ORAL
  Filled 2017-01-22: qty 2

## 2017-01-22 MED ORDER — KCL IN DEXTROSE-NACL 40-5-0.45 MEQ/L-%-% IV SOLN
INTRAVENOUS | Status: DC
  Administered 2017-01-22: 14:00:00 via INTRAVENOUS
  Filled 2017-01-22 (×7): qty 1000

## 2017-01-22 MED ORDER — SODIUM CHLORIDE 0.9% FLUSH
3.0000 mL | Freq: Two times a day (BID) | INTRAVENOUS | Status: DC
  Administered 2017-01-22: 3 mL via INTRAVENOUS

## 2017-01-22 MED ORDER — MAGNESIUM SULFATE 50 % IJ SOLN
3.0000 g | Freq: Once | INTRAMUSCULAR | Status: AC
  Administered 2017-01-22: 3 g via INTRAVENOUS
  Filled 2017-01-22: qty 6

## 2017-01-22 MED ORDER — POTASSIUM CHLORIDE IN NACL 40-0.9 MEQ/L-% IV SOLN
INTRAVENOUS | Status: DC
  Administered 2017-01-22 – 2017-01-23 (×2): 100 mL/h via INTRAVENOUS
  Filled 2017-01-22 (×3): qty 1000

## 2017-01-22 NOTE — ED Notes (Signed)
Dr. Jimmye Norman notified of critical k-dur

## 2017-01-22 NOTE — Care Management Obs Status (Signed)
[facility] NOTIFICATION   Patient Details  Name: SHAYLON GILLEAN MRN: [id number] Date of Birth: Feb 03, 1931   Medicare Observation Status Notification Given:  Yes    Beau Fanny, RN 01/22/2017, 3:17 PM

## 2017-01-22 NOTE — ED Notes (Signed)
Dr. Jimmye Norman notified of critical magnesium

## 2017-01-22 NOTE — Progress Notes (Signed)
Signed by daughter at bedside

## 2017-01-22 NOTE — ED Notes (Addendum)
States feeling bad for 10 days, states weakness, states right shoulder pain from abscess on her right shoulder, redness raised area noted, states decreased appetite but taking Pedialyte and protein drinks, denies any vomiting but states nausea, at present pt awake and alert, daughters at bedside

## 2017-01-22 NOTE — ED Notes (Signed)
EDP at bedside  

## 2017-01-22 NOTE — Care Management Note (Signed)
Case Management Note  Patient Details  Name: KIZZI OVERBEY MRN: [id number] Date of Birth: Jan 10, 1931  Subjective/Objective:   Spoke with daughter at length after explaining OBS notice. She is frustrated because she says this happened before. The patient was placed in OBS and stayed 2 days , so was later changed to IP. I have explained her to her that at this time the MD has stated he believes the patient will be better and discharged in 24 hrs or less. The status for the patient can be changed if this does not happen. I have let her know the patient will be reviewed again tomorrow and her status   Could change if the patient is not better. She has stated she appreciates the time taken to talk to her. I have checked and she has no further questions at this time.  Action/Plan:   Expected Discharge Date:                  Expected Discharge Plan:     In-House Referral:     Discharge planning Services     Post Acute Care Choice:    Choice offered to:     DME Arranged:    DME Agency:     HH Arranged:    HH Agency:     Status of Service:     If discussed at [employer] of Stay Meetings, dates discussed:    Additional Comments:  Beau Fanny, RN 01/22/2017, 3:17 PM

## 2017-01-22 NOTE — ED Notes (Signed)
Pt resting in bed, family at bedside, warm blanket given

## 2017-01-22 NOTE — ED Triage Notes (Signed)
Pt sent from PCP for dehydration. Pt has had general weakness. Labs show hyponatremia, hypokalemia and mildly increased renal labs. Pt has had decreased appetite

## 2017-01-22 NOTE — ED Provider Notes (Signed)
[facility] Emergency Department Provider Note       Time seen: ----------------------------------------- 1:29 PM on 01/22/2017 -----------------------------------------     I have reviewed the triage vital signs and the nursing notes.   HISTORY   Chief Complaint Weakness    HPI Joanne [name] is a 83 y.o. female who presents to the ED for possible dehydration. Patient has generalized weakness, labs reportedly showed low sodium, potassium and worsening renal function. Patient reports decreased appetite and by mouth intake over the past several days. She denies fevers, chills, chest pain, shortness of breath, vomiting or diarrhea.   Past Medical History:  Diagnosis Date  . Crohn disease ([place])   . Trigger finger     Patient Active Problem List   Diagnosis Date Noted  . Hyponatremia 02/21/2015  . Infected sebaceous cyst 11/10/2013    Past Surgical History:  Procedure Laterality Date  . APPENDECTOMY  1949  . CHOLECYSTECTOMY  1992  . EYE SURGERY  2012, 2013   5 total  . JOINT REPLACEMENT Right 01-28-13  . TONSILLECTOMY  1950    Allergies Patient has no known allergies.  Social History Social History  Substance Use Topics  . Smoking status: Former Smoker    Years: 30.00    Quit date: 08/27/1990  . Smokeless tobacco: Not on file  . Alcohol use Yes     Comment: rarely    Review of Systems Constitutional: Negative for fever. Eyes: Negative for vision changes ENT:  Negative for congestion, sore throat Cardiovascular: Negative for chest pain. Respiratory: Negative for shortness of breath. Gastrointestinal: Negative for abdominal pain, vomiting and diarrhea. Genitourinary: Negative for dysuria. Musculoskeletal: Negative for back pain. Skin: Negative for rash. Neurological: Negative for headaches,Positive for generalized weakness  All systems negative/normal/unremarkable except as stated in the  HPI  ____________________________________________   PHYSICAL EXAM:  VITAL SIGNS: ED Triage Vitals  Enc Vitals Group     BP 01/22/17 1157 (!) 96/55     Pulse Rate 01/22/17 1155 (!) 110     Resp 01/22/17 1155 20     Temp 01/22/17 1155 97.6 F (36.4 C)     Temp Source 01/22/17 1155 Oral     SpO2 01/22/17 1155 100 %     Weight 01/22/17 1156 144 lb (65.3 kg)     Height 01/22/17 1156 5' (1.524 m)     Head Circumference --      Peak Flow --      Pain Score 01/22/17 1155 5     Pain Loc --      Pain Edu? --      Excl. in [place]? --     Constitutional: Alert and oriented. Well appearing and in no distress. Eyes: Conjunctivae are normal. Normal extraocular movements. ENT   Head: Normocephalic and atraumatic.   Nose: No congestion/rhinnorhea.   Mouth/Throat: Mucous membranes are moist.   Neck: No stridor. Cardiovascular: Normal rate, regular rhythm. No murmurs, rubs, or gallops. Respiratory: Normal respiratory effort without tachypnea nor retractions. Breath sounds are clear and equal bilaterally. No wheezes/rales/rhonchi. Gastrointestinal: Soft and nontender. Normal bowel sounds Musculoskeletal: Nontender with normal range of motion in extremities. No lower extremity tenderness nor edema. Neurologic:  Normal speech and language. No gross focal neurologic deficits are appreciated.  Skin:  Skin is warm, dry and intact. No rash noted. Psychiatric: Mood and affect are normal. Speech and behavior are normal.  ____________________________________________  EKG: Interpreted by me.Sinus rhythm rate 88 bpm, normal PR interval, likely inferior  infarct age indeterminate, normal QT, left axis deviation  ____________________________________________  ED COURSE:  Pertinent labs & imaging results that were available during my care of the patient were reviewed by me and considered in my medical decision making (see chart for details). Patient presents for weakness and possible  dehydration, we will assess with labs and imaging as indicated.   Marland Kitchen.Incision and Drainage Date/Time: 01/22/2017 2:18 PM Performed by: Earleen Newport Authorized by: Lenise Arena E   Consent:    Consent obtained:  Verbal Location:    Type:  Abscess   Size:  4cm   Location:  Trunk   Trunk location:  Back Pre-procedure details:    Skin preparation:  Betadine Anesthesia (see MAR for exact dosages):    Anesthesia method:  Local infiltration   Local anesthetic:  Lidocaine 1% w/o epi Procedure type:    Complexity:  Complex Procedure details:    Needle aspiration: no     Incision types:  Single straight   Incision depth:  Subcutaneous   Scalpel blade:  11   Wound management:  Probed and deloculated   Drainage:  Purulent   Drainage amount:  Moderate   Wound treatment:  Wound left open   Packing materials:  None Post-procedure details:    Patient tolerance of procedure:  Tolerated well, no immediate complications Comments:     Sebaceous cyst was incised and drained. There was a mixed of sebum and purulent material expressed.   ____________________________________________   LABS (pertinent positives/negatives)  Labs Reviewed  CBC WITH DIFFERENTIAL/PLATELET - Abnormal; Notable for the following:       Result Value   WBC 12.5 (*)    RDW 14.6 (*)    Platelets 618 (*)    Neutro Abs 8.2 (*)    Basophils Absolute 0.2 (*)    All other components within normal limits  GLUCOSE, CAPILLARY - Abnormal; Notable for the following:    Glucose-Capillary 131 (*)    All other components within normal limits  COMPREHENSIVE METABOLIC PANEL  TROPONIN I  URINALYSIS, COMPLETE (UACMP) WITH MICROSCOPIC  CBG MONITORING, ED   CRITICAL CARE Performed by: Earleen Newport   Total critical care time: 30 minutes  Critical care time was exclusive of separately billable procedures and treating other patients.  Critical care was necessary to treat or prevent imminent or  life-threatening deterioration.  Critical care was time spent personally by me on the following activities: development of treatment plan with patient and/or surrogate as well as nursing, discussions with consultants, evaluation of patient's response to treatment, examination of patient, obtaining history from patient or surrogate, ordering and performing treatments and interventions, ordering and review of laboratory studies, ordering and review of radiographic studies, pulse oximetry and re-evaluation of patient's condition.  ____________________________________________  FINAL ASSESSMENT AND PLAN  Weakness, dehydration, Hypokalemia, hypomagnesemia, abscess  Plan: Patient's labs and imaging were dictated above. Patient had presented for weakness with possible dehydration. She is likely dehydrated but has profound like slight abnormalities. We have started repleting potassium and magnesium the IV. She received a liter of normal saline prior to D5 half-normal saline with 40 mEq of potassium and 2 g of magnesium. I will discuss with the hospitalist for admission.   Earleen Newport, MD   Note: This note was generated in part or whole with voice recognition software. Voice recognition is usually quite accurate but there are transcription errors that can and very often do occur. I apologize for any typographical errors that were not  detected and corrected.     Earleen Newport, MD 01/22/17 [phone]

## 2017-01-22 NOTE — H&P (Signed)
[facility] [place] NAME: Joanne [name]    MR#:  [id number]  DATE OF BIRTH:  [birth year]  DATE OF ADMISSION:  01/22/2017  PRIMARY CARE PHYSICIAN: Tracie Harrier, MD   REQUESTING/REFERRING PHYSICIAN: Dr. Jimmye Norman  CHIEF COMPLAINT:   Chief Complaint  Patient presents with  . Weakness    HISTORY OF PRESENT ILLNESS:  Joanne [name]  is a 83 y.o. female with a known history of Hypertension, Zenker's diverticulum, Crohn's disease presents to the emergency room sent in by her primary care physician after she was found to be dehydrated with low potassium and magnesium levels. Patient is on Pentasa and Entocort for her Crohn's disease. Chronic potassium and magnesium supplementation. 10 days back she thought she was coming down with a cold and stopped taking all her medications. Since then she has been progressively weaker. Dizzy. Today she has been found to have acute kidney injury, hypokalemia, hypomagnesemia. She does have chronic mild dysphagia due to Zenker's diverticulum. She has lost 15 pounds in the last 6 months. Follows with Dr. Vira Agar of GI. No blood in stool or melena. Lives alone and walks independently.  PAST MEDICAL HISTORY:   Past Medical History:  Diagnosis Date  . Crohn disease ([place])   . Hypertension   . Hypokalemia   . Hypomagnesemia   . Trigger finger   . Zenker diverticulum     PAST SURGICAL HISTORY:   Past Surgical History:  Procedure Laterality Date  . APPENDECTOMY  1949  . CHOLECYSTECTOMY  1992  . EYE SURGERY  2012, 2013   5 total  . JOINT REPLACEMENT Right 01-28-13  . TONSILLECTOMY  1950    SOCIAL HISTORY:   Social History  Substance Use Topics  . Smoking status: Former Smoker    Years: 30.00    Quit date: 08/27/1990  . Smokeless tobacco: Not on file  . Alcohol use Yes     Comment: rarely    FAMILY HISTORY:   Family History  Problem Relation Age of Onset  . Stroke Father   . Hypertension Father      DRUG ALLERGIES:  No Known Allergies  REVIEW OF SYSTEMS:   Review of Systems  Constitutional: Positive for malaise/fatigue and weight loss. Negative for chills and fever.  HENT: Negative for sore throat.   Eyes: Negative for blurred vision, double vision and pain.  Respiratory: Negative for cough, hemoptysis, shortness of breath and wheezing.   Cardiovascular: Negative for chest pain, palpitations, orthopnea and leg swelling.  Gastrointestinal: Negative for abdominal pain, constipation, diarrhea, heartburn, nausea and vomiting.  Genitourinary: Negative for dysuria and hematuria.  Musculoskeletal: Negative for back pain and joint pain.  Skin: Negative for rash.  Neurological: Positive for weakness. Negative for sensory change, speech change, focal weakness and headaches.  Endo/Heme/Allergies: Does not bruise/bleed easily.  Psychiatric/Behavioral: Negative for depression. The patient is not nervous/anxious.     MEDICATIONS AT HOME:   Prior to Admission medications   Medication Sig Start Date End Date Taking? Authorizing Provider  aspirin 81 MG tablet Take 81 mg by mouth daily.   Yes [provider]  budesonide (ENTOCORT EC) 3 MG 24 hr capsule Take 3 mg by mouth daily. Takes 1 capsule daily 02/11/15  Yes [provider]  Calcium Carb-Cholecalciferol (CALCIUM 600 + D PO) Take 3 tablets by mouth daily.   Yes [provider]  chlorthalidone (HYGROTON) 25 MG tablet Take 25 mg by mouth daily. 12/12/16  Yes [provider]  DHA-EPA-Vitamin E (OMEGA-3 COMPLEX PO) Take 2 tablets by mouth daily.   Yes [provider]  FERROUS SULFATE DRIED PO Take 100 mg by mouth daily.   Yes [provider]  Magnesium 400 MG CAPS Take 1 tablet by mouth 3 (three) times daily.    Yes [provider]  Multiple Vitamin (MULTIVITAMIN) capsule Take 1 capsule by mouth daily.   Yes [provider]  NEXIUM 40 MG capsule Take 40 mg by mouth daily.   08/26/13  Yes [provider]  PENTASA 500 MG CR capsule Take 1,000 mg by mouth 3 (three) times daily.  08/26/13  Yes [provider]  potassium chloride (KLOR-CON 10) 10 MEQ tablet Take 1 tablet (10 mEq total) by mouth daily. Patient taking differently: Take 20 mEq by mouth 2 (two) times daily.  02/23/15  Yes Hande, Vishwanath, MD  prednisoLONE acetate (PRED MILD) 0.12 % ophthalmic suspension Place 1 drop into both eyes at bedtime.    Yes [provider]  Probiotic Product (PROBIOTIC PO) Take 1 capsule by mouth daily.   Yes [provider]  vitamin B-12 (CYANOCOBALAMIN) 1000 MCG tablet Take 1,000 mcg by mouth daily.   Yes [provider]  vitamin C (ASCORBIC ACID) 500 MG tablet Take 500 mg by mouth daily.   Yes [provider]  Vitamin D, Cholecalciferol, 1000 UNITS TABS Take 1 tablet by mouth daily.    Yes [provider]     VITAL SIGNS:  Blood pressure 115/70, pulse 85, temperature 97.6 F (36.4 C), temperature source Oral, resp. rate 13, height 5' (1.524 m), weight 65.3 kg (144 lb), SpO2 99 %.  PHYSICAL EXAMINATION:  Physical Exam  GENERAL:  83 y.o.-year-old patient lying in the bed with no acute distress.  EYES: Pupils equal, round, reactive to light and accommodation. No scleral icterus. Extraocular muscles intact.  HEENT: Head atraumatic, normocephalic. Oropharynx and nasopharynx clear. No oropharyngeal erythema, moist oral dry NECK:  Supple, no jugular venous distention. No thyroid enlargement, no tenderness.  LUNGS: Normal breath sounds bilaterally, no wheezing, rales, rhonchi. No use of accessory muscles of respiration.  CARDIOVASCULAR: S1, S2 normal. No murmurs, rubs, or gallops.  ABDOMEN: Soft, nontender, nondistended. Bowel sounds present. No organomegaly or mass.  EXTREMITIES: No pedal edema, cyanosis, or clubbing. + 2 pedal & radial pulses b/l.   NEUROLOGIC: Cranial nerves II through XII are intact. No focal Motor  or sensory deficits appreciated b/l PSYCHIATRIC: The patient is alert and oriented x 3. Good affect.  SKIN: No obvious rash, lesion, or ulcer.   LABORATORY PANEL:   CBC  Recent Labs Lab 01/22/17 1221  WBC 12.5*  HGB 12.9  HCT 36.9  PLT 618*   ------------------------------------------------------------------------------------------------------------------  Chemistries   Recent Labs Lab 01/22/17 1221  NA 131*  K 2.1*  CL 96*  CO2 20*  GLUCOSE 138*  BUN 74*  CREATININE 1.72*  CALCIUM 9.3  MG 0.7*  AST 36  ALT 26  ALKPHOS 74  BILITOT 0.6   ------------------------------------------------------------------------------------------------------------------  Cardiac Enzymes  Recent Labs Lab 01/22/17 1221  TROPONINI <0.03   ------------------------------------------------------------------------------------------------------------------  RADIOLOGY:  No results found.   IMPRESSION AND PLAN:   * AKI with hypokalemia and hypomagnesemia Due to poor oral intake. Patient also stopped taking all her medications 10 days back including her potassium and magnesium supplements. We'll start on IV fluids. Supplement potassium and magnesium aggressively through IV and oral. Will need telemetry monitoring monitor electrolytes are being replaced. Counseled patient to be  compliant with medications in the future.  * Hypertension. Blood pressure in the normal range. Will hold medications till blood pressure improves.  * Crohn's disease. Restart patient's Pentasa and Entocort.  * Zenker's diverticulum with chronic mild dysphagia is unchanged.  * DVT prophylaxis with renally dosed Lovenox  All the records are reviewed and case discussed with ED provider. Management plans discussed with the patient, family and they are in agreement.  CODE STATUS: FULL CODE  TOTAL TIME TAKING CARE OF THIS PATIENT: 40 minutes.   Hillary Bow R M.D on 01/22/2017 at 3:02 PM  Between 7am to 6pm  - Pager - [phone]  After 6pm go to [email or web address] - password [email or web address] [facility]  Office  [phone]  CC: Primary care physician; Tracie Harrier, MD  Note: This dictation was prepared with Dragon dictation along with smaller phrase technology. Any transcriptional errors that result from this process are unintentional.

## 2017-01-23 LAB — BASIC METABOLIC PANEL
Anion gap: 6 (ref 5–15)
BUN: 48 mg/dL — ABNORMAL HIGH (ref 6–20)
CALCIUM: 8.1 mg/dL — AB (ref 8.9–10.3)
CHLORIDE: 114 mmol/L — AB (ref 101–111)
CO2: 17 mmol/L — AB (ref 22–32)
CREATININE: 0.94 mg/dL (ref 0.44–1.00)
GFR calc non Af Amer: 55 mL/min — ABNORMAL LOW (ref >60)
Glucose, Bld: 99 mg/dL (ref 65–99)
Potassium: 3.2 mmol/L — ABNORMAL LOW (ref 3.5–5.1)
Sodium: 137 mmol/L (ref 135–145)

## 2017-01-23 LAB — CBC
HCT: 33.4 % — ABNORMAL LOW (ref 35.0–47.0)
HEMOGLOBIN: 11.5 g/dL — AB (ref 12.0–16.0)
MCH: 29.5 pg (ref 26.0–34.0)
MCHC: 34.6 g/dL (ref 32.0–36.0)
MCV: 85.4 fL (ref 80.0–100.0)
Platelets: 520 10*3/uL — ABNORMAL HIGH (ref 150–440)
RBC: 3.91 MIL/uL (ref 3.80–5.20)
RDW: 14.9 % — ABNORMAL HIGH (ref 11.5–14.5)
WBC: 7.6 10*3/uL (ref 3.6–11.0)

## 2017-01-23 LAB — MAGNESIUM: Magnesium: 2.1 mg/dL (ref 1.7–2.4)

## 2017-01-23 MED ORDER — CALCIUM CARBONATE ANTACID 500 MG PO CHEW
1.0000 | CHEWABLE_TABLET | Freq: Three times a day (TID) | ORAL | Status: DC
  Administered 2017-01-23: 200 mg via ORAL
  Filled 2017-01-23: qty 1

## 2017-01-23 MED ORDER — BUDESONIDE 3 MG PO CPEP
3.0000 mg | ORAL_CAPSULE | Freq: Every day | ORAL | Status: DC
  Filled 2017-01-23: qty 1

## 2017-01-23 MED ORDER — ASPIRIN 81 MG PO CHEW: 81.0000 mg | CHEWABLE_TABLET | Freq: Every day | ORAL | Status: DC

## 2017-01-23 MED ORDER — POTASSIUM CHLORIDE CRYS ER 20 MEQ PO TBCR
20.0000 meq | EXTENDED_RELEASE_TABLET | Freq: Two times a day (BID) | ORAL | Status: DC
  Administered 2017-01-23: 20 meq via ORAL

## 2017-01-23 MED ORDER — PREDNISOLONE ACETATE 0.12 % OP SUSP
1.0000 [drp] | Freq: Every day | OPHTHALMIC | Status: DC
  Filled 2017-01-23: qty 5

## 2017-01-23 MED ORDER — POTASSIUM CHLORIDE CRYS ER 20 MEQ PO TBCR
20.0000 meq | EXTENDED_RELEASE_TABLET | Freq: Two times a day (BID) | ORAL | Status: DC
  Filled 2017-01-23: qty 1

## 2017-01-23 MED ORDER — MESALAMINE ER 250 MG PO CPCR
1000.0000 mg | ORAL_CAPSULE | Freq: Three times a day (TID) | ORAL | Status: DC
  Filled 2017-01-23: qty 4

## 2017-01-23 MED ORDER — MAGNESIUM OXIDE 400 (241.3 MG) MG PO TABS: 400.0000 mg | ORAL_TABLET | Freq: Three times a day (TID) | ORAL | Status: DC

## 2017-01-23 MED ORDER — POTASSIUM CHLORIDE ER 10 MEQ PO TBCR: 20.0000 meq | EXTENDED_RELEASE_TABLET | Freq: Two times a day (BID) | ORAL | Status: DC

## 2017-01-23 NOTE — Care Management (Signed)
Discussed case with bedside RN, and MD.  Patient dose not have a skilled RN for home health nursing.  Patient discharged home today with daughter.

## 2017-01-23 NOTE — Progress Notes (Signed)
Pharmacy consult  Electrolyte replaement  5/31 AM K+ 3.2, Calcium 8.1 (8.4 corrected.) KCl 20 mEq PO x 2 doses and CaCO3 500 mg x 3 doses ordered. Recheck BMP and Mg with tomorrow AM labs.

## 2017-01-23 NOTE — Progress Notes (Signed)
01/23/2017 10:50 AM  BP (!) 118/95 (BP Location: Right Arm) Comment:  RN Kierra Notified @ this time  Pulse 99   Temp 98.2 F (36.8 C) (Oral)   Resp 18   Ht 5' (1.524 m)   Wt 63.6 kg (140 lb 4.8 oz)   SpO2 100%   BMI 27.40 kg/m  Patient discharged per MD orders. Discharge instructions reviewed with patient and patient verbalized understanding. IV removed per policy. Discharged via wheelchair escorted by auxilary.  Almedia Balls, RN

## 2017-01-23 NOTE — Discharge Summary (Signed)
[facility] NAME: Joanne [name]    MR#:  [id number]  DATE OF BIRTH:  1930-12-28  DATE OF ADMISSION:  01/22/2017 ADMITTING PHYSICIAN: Hillary Bow, MD  DATE OF DISCHARGE: 01/23/2017  PRIMARY CARE PHYSICIAN: Tracie Harrier, MD    ADMISSION DIAGNOSIS:  Dehydration [E86.0] Hypokalemia [E87.6] Hypomagnesemia [E83.42] Weakness [R53.1]  DISCHARGE DIAGNOSIS:  Active Problems:   Hypokalemia   SECONDARY DIAGNOSIS:   Past Medical History:  Diagnosis Date  . Crohn disease ([place])   . Hypertension   . Hypokalemia   . Hypomagnesemia   . Trigger finger   . Zenker diverticulum     HOSPITAL COURSE:   83 year old female with a history of essential hypertension and Zenker diverticulum who presents with weakness and found to have significant electrolyte abnormalities.  1. Hypokalemia and hypomagnesemia due to poor oral intake as well as noncompliant with oral potassium and magnesium supplements. These were repleted and have improved. She will continue with oral supplements at home.  2. Acute kidney injury due to poor by mouth intake: This is improved with IV fluids  3. Essential hypertension: Patient may resume outpatient medications.  4. Crohn's disease and Zenker's diverticulum: Patient will follow up with GI  DISCHARGE CONDITIONS AND DIET:   Patient is stable for discharge on regular diet  CONSULTS OBTAINED:    DRUG ALLERGIES:  No Known Allergies  DISCHARGE MEDICATIONS:   Current Discharge Medication List    CONTINUE these medications which have CHANGED   Details  potassium chloride (KLOR-CON 10) 10 MEQ tablet Take 2 tablets (20 mEq total) by mouth 2 (two) times daily.      CONTINUE these medications which have NOT CHANGED   Details  aspirin 81 MG tablet Take 81 mg by mouth daily.    budesonide (ENTOCORT EC) 3 MG 24 hr capsule Take 3 mg by mouth daily. Takes 1 capsule daily    Calcium Carb-Cholecalciferol  (CALCIUM 600 + D PO) Take 3 tablets by mouth daily.    chlorthalidone (HYGROTON) 25 MG tablet Take 25 mg by mouth daily.    DHA-EPA-Vitamin E (OMEGA-3 COMPLEX PO) Take 2 tablets by mouth daily.    FERROUS SULFATE DRIED PO Take 100 mg by mouth daily.    Magnesium 400 MG CAPS Take 1 tablet by mouth 3 (three) times daily.     Multiple Vitamin (MULTIVITAMIN) capsule Take 1 capsule by mouth daily.    NEXIUM 40 MG capsule Take 40 mg by mouth daily.     PENTASA 500 MG CR capsule Take 1,000 mg by mouth 3 (three) times daily.     prednisoLONE acetate (PRED MILD) 0.12 % ophthalmic suspension Place 1 drop into both eyes at bedtime.     Probiotic Product (PROBIOTIC PO) Take 1 capsule by mouth daily.    vitamin B-12 (CYANOCOBALAMIN) 1000 MCG tablet Take 1,000 mcg by mouth daily.    vitamin C (ASCORBIC ACID) 500 MG tablet Take 500 mg by mouth daily.    Vitamin D, Cholecalciferol, 1000 UNITS TABS Take 1 tablet by mouth daily.           Today   CHIEF COMPLAINT:  She is doing well without weakness morning.   VITAL SIGNS:  Blood pressure (!) 118/95, pulse 99, temperature 98.2 F (36.8 C), temperature source Oral, resp. rate 18, height 5' (1.524 m), weight 63.6 kg (140 lb 4.8 oz), SpO2 100 %.   REVIEW OF SYSTEMS:  Review of Systems  Constitutional: Negative.  Negative for chills, fever and malaise/fatigue.  HENT: Negative.  Negative for ear discharge, ear pain, hearing loss, nosebleeds and sore throat.   Eyes: Negative.  Negative for blurred vision and pain.  Respiratory: Negative.  Negative for cough, hemoptysis, shortness of breath and wheezing.   Cardiovascular: Negative.  Negative for chest pain, palpitations and leg swelling.  Gastrointestinal: Negative.  Negative for abdominal pain, blood in stool, diarrhea, nausea and vomiting.  Genitourinary: Negative.  Negative for dysuria.  Musculoskeletal: Negative.  Negative for back pain.  Skin: Negative.   Neurological: Negative for  dizziness, tremors, speech change, focal weakness, seizures and headaches.  Endo/Heme/Allergies: Negative.  Does not bruise/bleed easily.  Psychiatric/Behavioral: Negative.  Negative for depression, hallucinations and suicidal ideas.     PHYSICAL EXAMINATION:  GENERAL:  83 y.o.-year-old patient lying in the bed with no acute distress.  NECK:  Supple, no jugular venous distention. No thyroid enlargement, no tenderness.  LUNGS: Normal breath sounds bilaterally, no wheezing, rales,rhonchi  No use of accessory muscles of respiration.  CARDIOVASCULAR: S1, S2 normal. No murmurs, rubs, or gallops.  ABDOMEN: Soft, non-tender, non-distended. Bowel sounds present. No organomegaly or mass.  EXTREMITIES: No pedal edema, cyanosis, or clubbing.  PSYCHIATRIC: The patient is alert and oriented x 3.  SKIN: No obvious rash, lesion, or ulcer.   DATA REVIEW:   CBC  Recent Labs Lab 01/23/17 0624  WBC 7.6  HGB 11.5*  HCT 33.4*  PLT 520*    Chemistries   Recent Labs Lab 01/22/17 1221 01/23/17 0624  NA 131* 137  K 2.1* 3.2*  CL 96* 114*  CO2 20* 17*  GLUCOSE 138* 99  BUN 74* 48*  CREATININE 1.72* 0.94  CALCIUM 9.3 8.1*  MG 0.7* 2.1  AST 36  --   ALT 26  --   ALKPHOS 74  --   BILITOT 0.6  --     Cardiac Enzymes  Recent Labs Lab 01/22/17 1221  TROPONINI <0.03    Microbiology Results  @MICRORSLT48 @  RADIOLOGY:  No results found.    Current Discharge Medication List    CONTINUE these medications which have CHANGED   Details  potassium chloride (KLOR-CON 10) 10 MEQ tablet Take 2 tablets (20 mEq total) by mouth 2 (two) times daily.      CONTINUE these medications which have NOT CHANGED   Details  aspirin 81 MG tablet Take 81 mg by mouth daily.    budesonide (ENTOCORT EC) 3 MG 24 hr capsule Take 3 mg by mouth daily. Takes 1 capsule daily    Calcium Carb-Cholecalciferol (CALCIUM 600 + D PO) Take 3 tablets by mouth daily.    chlorthalidone (HYGROTON) 25 MG tablet Take  25 mg by mouth daily.    DHA-EPA-Vitamin E (OMEGA-3 COMPLEX PO) Take 2 tablets by mouth daily.    FERROUS SULFATE DRIED PO Take 100 mg by mouth daily.    Magnesium 400 MG CAPS Take 1 tablet by mouth 3 (three) times daily.     Multiple Vitamin (MULTIVITAMIN) capsule Take 1 capsule by mouth daily.    NEXIUM 40 MG capsule Take 40 mg by mouth daily.     PENTASA 500 MG CR capsule Take 1,000 mg by mouth 3 (three) times daily.     prednisoLONE acetate (PRED MILD) 0.12 % ophthalmic suspension Place 1 drop into both eyes at bedtime.     Probiotic Product (PROBIOTIC PO) Take 1 capsule by mouth daily.    vitamin B-12 (CYANOCOBALAMIN) 1000 MCG tablet Take 1,000 mcg  by mouth daily.    vitamin C (ASCORBIC ACID) 500 MG tablet Take 500 mg by mouth daily.    Vitamin D, Cholecalciferol, 1000 UNITS TABS Take 1 tablet by mouth daily.            Management plans discussed with the patient and she is in agreement. Stable for discharge home  Patient should follow up with pcp  CODE STATUS:     Code Status Orders        Start     Ordered   01/22/17 1501  Full code  Continuous     01/22/17 1501    Code Status History    Date Active Date Inactive Code Status Order ID Comments User Context   02/21/2015  7:39 PM 02/23/2015  7:08 PM Full Code [id number]  Loletha Grayer, MD ED    Advance Directive Documentation     Most Recent Value  Type of Advance Directive  Living will  Pre-existing out of facility DNR order (yellow form or pink MOST form)  -  "MOST" Form in Place?  -      TOTAL TIME TAKING CARE OF THIS PATIENT: 37 minutes.    Note: This dictation was prepared with Dragon dictation along with smaller phrase technology. Any transcriptional errors that result from this process are unintentional.  Keonta Alsip M.D on 01/23/2017 at 8:01 AM  Between 7am to 6pm - Pager - [phone] After 6pm go to [email or web address] - password [email or web address]  Office   [phone]  CC: Primary care physician; Tracie Harrier, MD

## 2017-01-24 ENCOUNTER — Other Ambulatory Visit
Admission: RE | Admit: 2017-01-24 | Discharge: 2017-01-24 | Disposition: A | Discharge status: Home / Self Care | Payer: Medicare Other | Source: Ambulatory Visit | Attending: Nurse Practitioner | Admitting: Nurse Practitioner

## 2017-01-24 DIAGNOSIS — R1319 Other dysphagia: Secondary | ICD-10-CM

## 2017-01-24 DIAGNOSIS — K5080 Crohn's disease of both small and large intestine without complications: Secondary | ICD-10-CM | POA: Diagnosis present

## 2017-01-24 HISTORY — DX: Other dysphagia: R13.19

## 2017-01-24 LAB — GASTROINTESTINAL PANEL BY PCR, STOOL (REPLACES STOOL CULTURE)

## 2017-01-24 LAB — C DIFFICILE QUICK SCREEN W PCR REFLEX
C Diff antigen: NEGATIVE
C Diff interpretation: NOT DETECTED
C Diff toxin: NEGATIVE

## 2017-01-25 ENCOUNTER — Other Ambulatory Visit: Payer: Self-pay | Admitting: Nurse Practitioner

## 2017-01-25 DIAGNOSIS — K225 Diverticulum of esophagus, acquired: Secondary | ICD-10-CM

## 2017-01-29 ENCOUNTER — Ambulatory Visit: Payer: Self-pay | Admitting: General Surgery

## 2017-01-31 ENCOUNTER — Ambulatory Visit: Payer: Medicare Other

## 2017-02-18 ENCOUNTER — Ambulatory Visit
Admission: RE | Admit: 2017-02-18 | Discharge: 2017-02-18 | Disposition: A | Discharge status: Home / Self Care | Payer: Medicare Other | Source: Ambulatory Visit | Attending: Nurse Practitioner | Admitting: Nurse Practitioner

## 2017-02-18 DIAGNOSIS — K225 Diverticulum of esophagus, acquired: Secondary | ICD-10-CM | POA: Diagnosis not present

## 2017-04-01 ENCOUNTER — Encounter: Payer: Self-pay | Admitting: Medical Oncology

## 2017-04-01 ENCOUNTER — Emergency Department: Payer: Medicare Other

## 2017-04-01 ENCOUNTER — Emergency Department
Admission: EM | Admit: 2017-04-01 | Discharge: 2017-04-02 | Disposition: A | Discharge status: Skilled Nursing Facility | Payer: Medicare Other | Attending: Emergency Medicine | Admitting: Emergency Medicine

## 2017-04-01 DIAGNOSIS — Y9389 Activity, other specified: Secondary | ICD-10-CM | POA: Insufficient documentation

## 2017-04-01 DIAGNOSIS — S61216A Laceration without foreign body of right little finger without damage to nail, initial encounter: Secondary | ICD-10-CM | POA: Insufficient documentation

## 2017-04-01 DIAGNOSIS — S32591A Other specified fracture of right pubis, initial encounter for closed fracture: Secondary | ICD-10-CM | POA: Diagnosis not present

## 2017-04-01 DIAGNOSIS — Y999 Unspecified external cause status: Secondary | ICD-10-CM | POA: Diagnosis not present

## 2017-04-01 DIAGNOSIS — W010XXA Fall on same level from slipping, tripping and stumbling without subsequent striking against object, initial encounter: Secondary | ICD-10-CM | POA: Diagnosis not present

## 2017-04-01 DIAGNOSIS — S0990XA Unspecified injury of head, initial encounter: Secondary | ICD-10-CM | POA: Diagnosis present

## 2017-04-01 DIAGNOSIS — Y929 Unspecified place or not applicable: Secondary | ICD-10-CM | POA: Diagnosis not present

## 2017-04-01 DIAGNOSIS — S2231XA Fracture of one rib, right side, initial encounter for closed fracture: Secondary | ICD-10-CM | POA: Diagnosis not present

## 2017-04-01 DIAGNOSIS — S61210A Laceration without foreign body of right index finger without damage to nail, initial encounter: Secondary | ICD-10-CM

## 2017-04-01 LAB — BASIC METABOLIC PANEL
ANION GAP: 9 (ref 5–15)
BUN: 18 mg/dL (ref 6–20)
CO2: 30 mmol/L (ref 22–32)
Calcium: 9.3 mg/dL (ref 8.9–10.3)
Chloride: 100 mmol/L — ABNORMAL LOW (ref 101–111)
Creatinine, Ser: 0.65 mg/dL (ref 0.44–1.00)
GFR calc non Af Amer: >60 mL/min (ref >60)
GLUCOSE: 94 mg/dL (ref 65–99)
POTASSIUM: 3.3 mmol/L — AB (ref 3.5–5.1)
Sodium: 139 mmol/L (ref 135–145)

## 2017-04-01 LAB — CBC
HEMATOCRIT: 34.2 % — AB (ref 35.0–47.0)
HEMOGLOBIN: 11.3 g/dL — AB (ref 12.0–16.0)
MCH: 28.2 pg (ref 26.0–34.0)
MCHC: 33 g/dL (ref 32.0–36.0)
MCV: 85.2 fL (ref 80.0–100.0)
Platelets: 407 10*3/uL (ref 150–440)
RBC: 4.02 MIL/uL (ref 3.80–5.20)
RDW: 15 % — ABNORMAL HIGH (ref 11.5–14.5)
WBC: 7.9 10*3/uL (ref 3.6–11.0)

## 2017-04-01 MED ORDER — TRAMADOL HCL 50 MG PO TABS: 50.0000 mg | ORAL_TABLET | Freq: Four times a day (QID) | ORAL | Status: DC | PRN

## 2017-04-01 MED ORDER — ACETAMINOPHEN 325 MG PO TABS
650.0000 mg | ORAL_TABLET | Freq: Once | ORAL | Status: AC
  Administered 2017-04-01: 650 mg via ORAL
  Filled 2017-04-01: qty 2

## 2017-04-01 MED ORDER — POTASSIUM CHLORIDE CRYS ER 20 MEQ PO TBCR
40.0000 meq | EXTENDED_RELEASE_TABLET | ORAL | Status: AC
  Administered 2017-04-01: 40 meq via ORAL
  Filled 2017-04-01: qty 2

## 2017-04-01 MED ORDER — LIDOCAINE HCL (PF) 1 % IJ SOLN
INTRAMUSCULAR | Status: AC
  Filled 2017-04-01: qty 5

## 2017-04-01 MED ORDER — LIDOCAINE HCL (PF) 1 % IJ SOLN: 5.0000 mL | Freq: Once | INTRAMUSCULAR | Status: DC

## 2017-04-01 NOTE — Progress Notes (Signed)
CSW has sent PT Evaluation notes to [facility] via the [facility].    Lorrine Kin, MSW, [place] Clinical Social Worker [phone]

## 2017-04-01 NOTE — Progress Notes (Signed)
CSW received phone call from [place] at [facility]. Patient has been accepted pending PT evaluation. Per [name], once PT evaluation has been sent over, they will be able to process insurance authorization and at that point she can provide CSW with room number and nurse to nurse report. [name] reports that due to time of day, new admission would have to be first thing tomorrow morning.   Patient will DC to: [facility]  Anticipated DC date: 04/02/2017 Family notified: Patient's daughters [name] and [place])  Transport by: [facility]   CSW has notified Patient's Physiological scientist.   Lorrine Kin, MSW, LCSW [facility] ED/80M Clinical Social Worker [phone]

## 2017-04-01 NOTE — Progress Notes (Signed)
CSW engaged with Patient and Patient's daughters at Patient's bedside. CSW introduced self and role of CSW. Patient with pelvis fracture and lives alone. Patient and family requesting SNF placement. Patient with [facility] and can be placed from the Emergency Department. CSW requested PT consult. CSW explained to family the process of SNF Placement and referral process. Patient and family in agreement. CSW has completed FL-2 and made referrals to local SNF facilities. Awaiting bed offers and PT consult.    Lorrine Kin, MSW, [place] Clinical Social Worker [phone]

## 2017-04-01 NOTE — ED Notes (Addendum)
Patient reports falling at 10:30 last night when getting out of car.  Patient fell onto right hip and then hit her head on the right side.  Patient with bruise to right lateral side of head, bruising to right elbow, non-bleeding skin tears to right hand. No bruising to right hip, right knee, or buttocks area.  Patient only reports pain with transfers and ambulation.  No deformity or swelling noted to right leg. Patient with old scar from hip replacement in 2014.  Patient denies loss of consciousness when hitting head. Pupils are PERRLA.  Right and left dorsalis pedis pulses 2 +.

## 2017-04-01 NOTE — NC FL2 (Signed)
[place] LEVEL OF CARE SCREENING TOOL     IDENTIFICATION  Patient Name: Joanne [name] Birthdate: [birth year] Sex: female Admission Date (Current Location): 04/01/2017  [place] and [place] Number:  Engineering geologist and Address:  [facility], [address] [place], [place] [address]      Provider Number: [id number]  Attending Physician Name and Address:  Harvest Dark, MD  Relative Name and Phone Number:  Elgie Collard (Daughter) [phone]; Clement Husbands (Daughter) [phone]    Current Level of Care: Hospital Recommended Level of Care: Nursing Facility Prior Approval Number:    Date Approved/Denied:   PASRR Number: [id number]  Discharge Plan: SNF    Current Diagnoses: Patient Active Problem List   Diagnosis Date Noted  . Hypokalemia 01/22/2017  . Hyponatremia 02/21/2015  . Infected sebaceous cyst 11/10/2013    Orientation RESPIRATION BLADDER Height & Weight     Self, Time, Place, Situation  Normal Continent Weight: 155 lb (70.3 kg) Height:  5' (152.4 cm)  BEHAVIORAL SYMPTOMS/MOOD NEUROLOGICAL BOWEL NUTRITION STATUS   (NONE)   Continent Diet (Heart Healthy )  AMBULATORY STATUS COMMUNICATION OF NEEDS Skin   Extensive Assist Verbally Skin abrasions (splint on finger )                       Personal Care Assistance Level of Assistance  Bathing, Feeding, Dressing Bathing Assistance: Limited assistance Feeding assistance: Independent Dressing Assistance: Limited assistance     Functional Limitations Info  Sight, Hearing, Speech Sight Info: Adequate Hearing Info: Adequate Speech Info: Adequate    SPECIAL CARE FACTORS FREQUENCY  PT (By licensed PT)                    Contractures Contractures Info: Not present    Additional Factors Info  Code Status, Allergies Code Status Info: FULL Allergies Info: No Known Allergies           Current Medications (04/01/2017):  This is the current  hospital active medication list Current Facility-Administered Medications  Medication Dose Route Frequency Provider Last Rate Last Dose  . lidocaine (PF) (XYLOCAINE) 1 % injection 5 mL  5 mL Intradermal Once Laban Emperor, PA-C      . lidocaine (PF) (XYLOCAINE) 1 % injection            Current Outpatient Prescriptions  Medication Sig Dispense Refill  . aspirin 81 MG tablet Take 81 mg by mouth daily.    . budesonide (ENTOCORT EC) 3 MG 24 hr capsule Take 3 mg by mouth daily. Takes 1 capsule daily    . Calcium Carb-Cholecalciferol (CALCIUM 600 + D PO) Take 3 tablets by mouth daily.    . chlorthalidone (HYGROTON) 25 MG tablet Take 25 mg by mouth daily.    . DHA-EPA-Vitamin E (OMEGA-3 COMPLEX PO) Take 2 tablets by mouth daily.    Marland Kitchen FERROUS SULFATE DRIED PO Take 100 mg by mouth daily.    . Magnesium 400 MG CAPS Take 1 tablet by mouth 3 (three) times daily.     . Multiple Vitamin (MULTIVITAMIN) capsule Take 1 capsule by mouth daily.    Marland Kitchen NEXIUM 40 MG capsule Take 40 mg by mouth daily.     Marland Kitchen PENTASA 500 MG CR capsule Take 1,000 mg by mouth 3 (three) times daily.     . potassium chloride (KLOR-CON 10) 10 MEQ tablet Take 2 tablets (20 mEq total) by mouth 2 (two) times daily.    Marland Kitchen  prednisoLONE acetate (PRED MILD) 0.12 % ophthalmic suspension Place 1 drop into both eyes at bedtime.     . Probiotic Product (PROBIOTIC PO) Take 1 capsule by mouth daily.    . vitamin B-12 (CYANOCOBALAMIN) 1000 MCG tablet Take 1,000 mcg by mouth daily.    . vitamin C (ASCORBIC ACID) 500 MG tablet Take 500 mg by mouth daily.    . Vitamin D, Cholecalciferol, 1000 UNITS TABS Take 1 tablet by mouth daily.        Discharge Medications: Please see discharge summary for a list of discharge medications.  Relevant Imaging Results:  Relevant Lab Results:   Additional Information SS #: [id number]  Lind Covert, LCSW

## 2017-04-01 NOTE — ED Notes (Signed)
Assisted patient to bedside commode

## 2017-04-01 NOTE — ED Notes (Signed)
Pt at bedside for eval

## 2017-04-01 NOTE — ED Notes (Signed)
Social work at bedside to discuss placements

## 2017-04-01 NOTE — ED Notes (Signed)
Report given to Amy RN.

## 2017-04-01 NOTE — ED Triage Notes (Addendum)
Pt reports she was getting out her car last night and turned around and lost her balance. Pt reports she fell on her rt hip. C/o pain to hip with replacement in the past but denies pain at this time. Pt also reports she hit her head but did not black out. Pt denies use of blood thinner. A/O at this time in NAD.

## 2017-04-01 NOTE — ED Provider Notes (Signed)
[facility] Emergency Department Provider Note  ____________________________________________  Time seen: Approximately 10:38 AM  I have reviewed the triage vital signs and the nursing notes.   HISTORY  Chief Complaint Fall; Hip Pain; and Head Injury    HPI Joanne [name] is a 83 y.o. female that presents to the emergency department for evaluation of rib pain, hip pain, hand pain after fall last night. She was carrying a lot of items from her car and went to shut the car door when she lost her footing and fell. She fell on her right hip, and she has not been able to bear weight on hip since fall. She is also having pain over right rib cage that is worse with deep breaths. She hit her head but did not loose consciousness. She denies headache or visual changes. She has been acting like herself since fall and family members agree. She injured 4th and 5th fingers where her rings got stuck. She does not take any blood thinners, including aspirin. She lives home alone. No SOB, CP, nausea, vomiting, abdominal pain, numnbess, tingling.    Past Medical History:  Diagnosis Date  . Crohn disease ([place])   . Hypertension   . Hypokalemia   . Hypomagnesemia   . Trigger finger   . [place]ker diverticulum     Patient Active Problem List   Diagnosis Date Noted  . Hypokalemia 01/22/2017  . Hyponatremia 02/21/2015  . Infected sebaceous cyst 11/10/2013    Past Surgical History:  Procedure Laterality Date  . APPENDECTOMY  1949  . CHOLECYSTECTOMY  1992  . EYE SURGERY  2012, 2013   5 total  . JOINT REPLACEMENT Right 01-28-13  . TONSILLECTOMY  1950    Prior to Admission medications   Medication Sig Start Date End Date Taking? Authorizing Provider  aspirin 81 MG tablet Take 81 mg by mouth daily.    [provider]  budesonide (ENTOCORT EC) 3 MG 24 hr capsule Take 3 mg by mouth daily. Takes 1 capsule daily 02/11/15   [provider]  Calcium Carb-Cholecalciferol  (CALCIUM 600 + D PO) Take 3 tablets by mouth daily.    [provider]  chlorthalidone (HYGROTON) 25 MG tablet Take 25 mg by mouth daily. 12/12/16   [provider]  DHA-EPA-Vitamin E (OMEGA-3 COMPLEX PO) Take 2 tablets by mouth daily.    [provider]  FERROUS SULFATE DRIED PO Take 100 mg by mouth daily.    [provider]  Magnesium 400 MG CAPS Take 1 tablet by mouth 3 (three) times daily.     [provider]  Multiple Vitamin (MULTIVITAMIN) capsule Take 1 capsule by mouth daily.    [provider]  NEXIUM 40 MG capsule Take 40 mg by mouth daily.  08/26/13   [provider]  PENTASA 500 MG CR capsule Take 1,000 mg by mouth 3 (three) times daily.  08/26/13   [provider]  potassium chloride (KLOR-CON 10) 10 MEQ tablet Take 2 tablets (20 mEq total) by mouth 2 (two) times daily. 01/23/17   Bettey Costa, MD  prednisoLONE acetate (PRED MILD) 0.12 % ophthalmic suspension Place 1 drop into both eyes at bedtime.     [provider]  Probiotic Product (PROBIOTIC PO) Take 1 capsule by mouth daily.    [provider]  vitamin B-12 (CYANOCOBALAMIN) 1000 MCG tablet Take 1,000 mcg by mouth daily.    [provider]  vitamin C (ASCORBIC ACID) 500 MG tablet  Take 500 mg by mouth daily.    [provider]  Vitamin D, Cholecalciferol, 1000 UNITS TABS Take 1 tablet by mouth daily.     [provider]    Allergies Patient has no known allergies.  Family History  Problem Relation Age of Onset  . Stroke Father   . Hypertension Father     Social History Social History  Substance Use Topics  . Smoking status: Former Smoker    Years: 30.00    Quit date: 08/27/1990  . Smokeless tobacco: Never Used  . Alcohol use Yes     Comment: rarely     Review of Systems  Constitutional: No fever/chills Cardiovascular: No chest pain. Respiratory: No SOB. Gastrointestinal: No abdominal pain.  No  nausea, no vomiting.  Musculoskeletal: Positive for rib pain, hip pain, finger pain.  Skin: Positive for lacerations, ecchymosis. Neurological: Negative for headaches, numbness or tingling   ____________________________________________   PHYSICAL EXAM:  VITAL SIGNS: ED Triage Vitals  Enc Vitals Group     BP 04/01/17 0918 (!) 153/70     Pulse Rate 04/01/17 0918 74     Resp 04/01/17 0918 18     Temp 04/01/17 0918 98.2 F (36.8 C)     Temp Source 04/01/17 0918 Oral     SpO2 04/01/17 0918 95 %     Weight 04/01/17 0918 155 lb (70.3 kg)     Height 04/01/17 0918 5' (1.524 m)     Head Circumference --      Peak Flow --      Pain Score 04/01/17 0917 0     Pain Loc --      Pain Edu? --      Excl. in [place]? --      Constitutional: Alert and oriented. Well appearing and in no acute distress. Eyes: Conjunctivae are normal. Left pupil unreactive, which patient states is chronic. EOMI. Head: Atraumatic. ENT:      Ears:      Nose: No congestion/rhinnorhea.      Mouth/Throat: Mucous membranes are moist.  Neck: No stridor.  No cervical spine tenderness to palpation. Cardiovascular: Normal rate, regular rhythm.  Good peripheral circulation. 2+ dorsalis pedis pulses. Respiratory: Normal respiratory effort without tachypnea or retractions. Lungs CTAB. Good air entry to the bases with no decreased or absent breath sounds. Gastrointestinal: Bowel sounds 4 quadrants. Soft and nontender to palpation. No guarding or rigidity. No palpable masses. No distention.  Musculoskeletal: No gross deformities appreciated.Tenderness to palpation over right lateral inferior rib cage. Limited flexion, extension, internal rotation of right hip due to pain. Tenderness to palpation over right trochanteric bursa.  Neurologic:  Normal speech and language. No gross focal neurologic deficits are appreciated.  Skin:  Skin is warm, dry. 1/2 cm curved laceration over the PIP joint of 5th little finger on right hand with  surrounding ecchymosis.   ____________________________________________   LABS (all labs ordered are listed, but only abnormal results are displayed)  Labs Reviewed - No data to display ____________________________________________  EKG   ____________________________________________  RADIOLOGY Robinette Haines, personally viewed and evaluated these images (plain radiographs) as part of my medical decision making, as well as reviewing the written report by the radiologist.  Dg Ribs Unilateral W/chest Right  Result Date: 04/01/2017 CLINICAL DATA:  [place] at 2230 hours last night getting out of a car landing on RIGHT hip, struck head on RIGHT side, RIGHT elbow bruising, skin tear RIGHT hand, pain with transverse and ambulation EXAM: RIGHT  RIBS AND CHEST - 3+ VIEW COMPARISON:  02/27/2007 chest radiograph FINDINGS: Enlargement of cardiac silhouette. Atherosclerotic calcification aorta. Mediastinal contours and pulmonary vascularity normal. Bronchitic changes, chronic. Lungs otherwise clear. No pulmonary infiltrate, pleural effusion or pneumothorax. Diffuse osseous demineralization. Old healed posttraumatic deformity of the proximal RIGHT humerus. BB placed at site of symptoms lower RIGHT chest. Minimally displaced fracture at the anterior RIGHT eighth rib. IMPRESSION: Osseous demineralization with a minimally displaced fracture at the anterior RIGHT eighth rib. Minimal enlargement of cardiac silhouette. Mild chronic bronchitic changes. Aortic Atherosclerosis (ICD10-I70.0). Electronically Signed   By: Lavonia Dana M.D.   On: 04/01/2017 11:32   Ct Head Wo Contrast  Result Date: 04/01/2017 CLINICAL DATA:  Head trauma.  Fall last night.  Headache. EXAM: CT HEAD WITHOUT CONTRAST TECHNIQUE: Contiguous axial images were obtained from the base of the skull through the vertex without intravenous contrast. COMPARISON:  02/27/2007 FINDINGS: Brain: There is moderate lateral and third ventriculomegaly which has  slightly progressed from 2008 and which is out of proportion to the sulci. There is some crowding of the gyri at the vertex. There is minimal nonspecific periventricular white matter hypoattenuation. No acute cortically based infarct, intracranial hemorrhage, mass, midline shift, or extra-axial fluid collection is identified. Vascular: Calcified atherosclerosis at the skullbase. No hyperdense vessel. Skull: No fracture or focal osseous lesion. Sinuses/Orbits: The visualized paranasal sinuses and mastoid air cells are clear. Postoperative changes to the globes. Other: None. IMPRESSION: 1. No evidence of acute intracranial abnormality. 2. Moderate ventriculomegaly, slightly progressed from 2008 and which may reflect normal pressure hydrocephalus in the appropriate clinical setting. Electronically Signed   By: Logan Bores M.D.   On: 04/01/2017 12:27   Ct Pelvis Wo Contrast  Result Date: 04/01/2017 CLINICAL DATA:  Right hip injury due to a fall getting out of a car last night at 10:30 p.m. Initial encounter. EXAM: CT PELVIS WITHOUT CONTRAST TECHNIQUE: Multidetector CT imaging of the pelvis was performed following the standard protocol without intravenous contrast. COMPARISON:  None. FINDINGS: Urinary Tract:  Negative. Bowel: Mildly dilated loops of small and large bowel are identified. High attenuation material is seen within bowel loops. Vascular/Lymphatic: Atherosclerosis is noted.  No lymphadenopathy. Reproductive:  No mass or other significant abnormality Other:  No fluid collection. Musculoskeletal: There is an acute nondisplaced fracture of the right inferior pubic ramus. No other fracture is identified. There is some degenerative change about the SI joints and symphysis pubis. Right hip arthroplasty is in place. Facet degenerative change lower lumbar spine is noted. IMPRESSION: Acute nondisplaced right inferior pubic ramus fracture. Mildly dilated loops of small and large bowel may be due to ileus. If  indicated, plain films of the abdomen could be obtained for further evaluation. Atherosclerosis. Electronically Signed   By: Inge Rise M.D.   On: 04/01/2017 12:26   Dg Hand Complete Right  Result Date: 04/01/2017 CLINICAL DATA:  [place] at 2230 hours last night getting out of a car landing on RIGHT hip, struck head on RIGHT side, RIGHT elbow bruising, skin tear RIGHT hand, pain with transverse and ambulation EXAM: RIGHT HAND - COMPLETE 3+ VIEW COMPARISON:  RIGHT wrist radiographs 02/24/2007 FINDINGS: Diffuse osseous demineralization. Joint spaces preserved. Chondrocalcinosis at carpus. No acute fracture, dislocation, or bone destruction. IMPRESSION: No acute osseous abnormalities. 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 Electronically Signed   By: Lavonia Dana M.D.   On: 04/01/2017 11:34   Dg Hip Unilat W Or Wo Pelvis 2-3 Views Right  Result Date: 04/01/2017 CLINICAL DATA:  [place] at  2230 hours last night getting out of a car landing on RIGHT hip, struck head on RIGHT side, RIGHT elbow bruising, skin tear RIGHT hand, pain with transverse and ambulation EXAM: DG HIP (WITH OR WITHOUT PELVIS) 2-3V RIGHT COMPARISON:  01/28/2013 FINDINGS: RIGHT hip prosthesis. Diffuse osseous demineralization. Age-indeterminate nondisplaced fracture RIGHT inferior pubic ramus. No additional fracture, dislocation, or bone destruction. Visualized proximal RIGHT femur appears intact. IMPRESSION: Age-indeterminate nondisplaced fracture of the RIGHT inferior pubic ramus. Osseous demineralization. Electronically Signed   By: Lavonia Dana M.D.   On: 04/01/2017 11:30    ____________________________________________    PROCEDURES  Procedure(s) performed:    Procedures  LACERATION REPAIR Performed by: Denton Ar PA-S  Consent: Verbal consent obtained.  Consent given by: patient  Prepped and Draped in normal sterile fashion  Wound explored: No foreign bodies   Laceration Location: right little finger  Laceration Length: 1/2 cm  Anesthesia:  None  Local anesthetic: lidocaine 1% without epinephrine  Anesthetic total: 4 ml  Irrigation method: syringe  Amount of cleaning: 546m normal saline  Skin closure: 5-0 nylon  Number of sutures: 5  Technique: Simple interrupted  Patient tolerance: Patient tolerated the procedure well with no immediate complications.  Medications  lidocaine (PF) (XYLOCAINE) 1 % injection 5 mL (not administered)  lidocaine (PF) (XYLOCAINE) 1 % injection (not administered)  acetaminophen (TYLENOL) tablet 650 mg (650 mg Oral Given 04/01/17 1344)     ____________________________________________   INITIAL IMPRESSION / ASSESSMENT AND PLAN / ED COURSE  Pertinent labs & imaging results that were available during my care of the patient were reviewed by me and considered in my medical decision making (see chart for details).  Review of the [place] CSRS was performed in accordance of the [place] to dispensing any controlled drugs.    Patient presented to emergency department for evaluation after fall. Vital signs and exam are reassuring. Chest and rib x-ray indicates minimally displaced right eighth rib fracture. Hip and pelvis x-ray indicates nondisplaced pubis ramus fracture of indeterminate age. CT confirms acute fracture. No acute processes on head CT. No acute bony abnormalities on right hand x-ray.  Laceration on finger was repaired by PA student BDenton Ar Splint was placed. Patient appears well and was given Tylenol for pain. Patient is not able to bear weight and lives alone so family would like her placed with rehabilitation. Social work consult and physical therapy consults are placed. [facility] Patient will follow-up with orthopedics.     ____________________________________________  FINAL CLINICAL IMPRESSION(S) / ED DIAGNOSES  Final diagnoses:  Closed fracture of ramus of right pubis[place]  Laceration of right index finger  without foreign body without damage to nail, initial encounter  Closed fracture of one rib of right side, initial encounter      NEW MEDICATIONS STARTED DURING THIS VISIT:  New Prescriptions   No medications on file        This chart was dictated using voice recognition software/Dragon. Despite best efforts to proofread, errors can occur which can change the meaning. Any change was purely unintentional.    WLaban Emperor PA-C 04/01/17 1614    PHarvest Dark MD 04/03/17 2215

## 2017-04-01 NOTE — ED Provider Notes (Addendum)
Medical screening examination/treatment/procedure(s) were conducted as a shared visit with non-physician practitioner(s) and myself.  I personally evaluated the patient during the encounter.     Delman Kitten, MD 04/01/17 1658   ----------------------------------------- 5:10 PM on 04/01/2017 -----------------------------------------  Patient is resting comfortably, reports no pain. Reports she fell walking things out of the car. He reports no pain on sitting still, reports nurse goes to move her right pelvis feels very sore. I'm able to range the hip well with minimal discomfort that she reports in the pelvic region. No shortening or rotation. She does reports she's had a history of problems with her electrodes in the past when like to have blood work done, which I ordered. Denies any chest pain, neurologic symptoms or trouble breathing. Reviewed her medications with her, she does not wish for [place] to dispense any medicines her overnight. Daughters at bedside, plan of care is to place her into rehabilitation which started been arranged but awaiting bed to open tomorrow.  Imaging studies suggest she may have a possible ileus, patient reports no abdominal pain. Abdomen soft and nontender. She is able to eat and drink without any difficulty. I see no clinical evidence to support an ileus.   Delman Kitten, MD 04/01/17 Jasper Riling    Delman Kitten, MD 04/01/17 1711  EKG reviewed and interpreted by me at 1735 Heart rate 65 QRS 90 QTc 4:30 Normal sinus rhythm, no evidence of ischemia, possible old anteroseptal infarct, which is old compared to 01/24/2017   Delman Kitten, MD 04/01/17 1843    ----------------------------------------- 11:59 PM on 04/01/2017 -----------------------------------------  Ongoing care assigned to Dr. Dahlia Client. Patient awaiting placement in rehabilitation where a bed has been secured that is awaiting final approval tomorrow morning.   Delman Kitten, MD 04/02/17 0000

## 2017-04-01 NOTE — ED Notes (Signed)
Pt eating dinner tray °

## 2017-04-01 NOTE — ED Notes (Signed)
Resumed care from [place] rn.    Pt alert.  Family with pt. Pt staying overnight while waiting on placement tomorrow.  Pt has  fx pelvis.

## 2017-04-01 NOTE — ED Provider Notes (Signed)
-----------------------------------------   4:21 PM on 04/01/2017 -----------------------------------------   Blood pressure (!) 153/70, pulse 74, temperature 98.2 F (36.8 C), temperature source Oral, resp. rate 18, height 5' (1.524 m), weight 70.3 kg (155 lb), SpO2 95 %.  Assuming care from Laban Emperor, PA-C.  In short, Joanne [name] is a 83 y.o. female with a chief complaint of Fall; Hip Pain; and Head Injury .  Refer to the original H&P for additional details.  Patient was assessed and treated by previous provider. Current plan of action is to wait for social worker's placement into a skilled nursing facility as patient is unable to bear weight due to pelvis fracture. Physical therapy is consult did. At this time, patient will be monitored in the emergency department, analgesia provided if necessary, await placement in skilled nursing facility..  Physical therapy evaluated patient and cleared patient for rehabilitation. Social worker was involved in the case and has placement for patient tomorrow morning in a skilled nursing facility for rehabilitation. At this time, patient is transferred to the major side emergency Department and report is given to Dr. Jacqualine Code as patient will be residing in the emergency department overnight until she is able to be placed.        Darletta Moll, PA-C 04/01/17 1702    Nance Pear, MD 04/01/17 2133

## 2017-04-01 NOTE — Progress Notes (Signed)
Physical Therapy Evaluation Patient Details Name: Joanne [name] MRN: [id number] DOB: Aug 11, 1931 Today's Date: 04/01/2017   History of Present Illness  Pt suferred a fall with a non-displaced R inferior pubic rami fracture as well as R eight rib fracture and finger laceration. She is awaiting placement in SNF.   Clinical Impression  Pt admitted with above diagnosis. Pt currently with functional limitations due to the deficits listed below (see PT Problem List).  Per assigned provider in ED pt is WBAT on RLE. She requires modA+1 to manage bilateral LE as well as support trunk during bed mobility. Increase in pain with all motion of RLE. She is minA+1 for transfers and very limited ambulation in room. She was provided with education about proper sequencing with walker during forward/retro ambulation. She requires minA+1 as well as heavy UE support to offload RLE during ambulation. She is severely limited in how far she can ambulate due to RLE pain and bilateral UE weakness when supporting bodyweight. Pt lives alone and is unable to function at home in her current state. She will need SNF placement for skilled PT/OT in order to faciltiate safe transition back home. Pt will benefit from PT services to address deficits in strength, balance, and mobility in order to return to full function at home after leaving SNF.     Follow Up Recommendations SNF    Equipment Recommendations  None recommended by PT    Recommendations for Other Services       Precautions / Restrictions Precautions Precautions: Fall Restrictions Weight Bearing Restrictions: Yes RLE Weight Bearing: Weight bearing as tolerated      Mobility  Bed Mobility Overal bed mobility: Needs Assistance Bed Mobility: Supine to Sit;Sit to Supine     Supine to sit: Mod assist Sit to supine: Mod assist   General bed mobility comments: Requires assist to manage bilateral LE as well as support trunk during bed mobility. Increase in pain  with all motion of RLE  Transfers Overall transfer level: Needs assistance Equipment used: Rolling walker (2 wheeled) Transfers: Sit to/from Stand Sit to Stand: Min assist         General transfer comment: Cues for safe hand placement. MinA+1 to come to standing secondary to pain with weight shifting to RLE.  Ambulation/Gait Ambulation/Gait assistance: Min assist Ambulation Distance (Feet): 4 Feet Assistive device: Rolling walker (2 wheeled) Gait Pattern/deviations: Decreased step length - left;Decreased stance time - right;Decreased weight shift to right Gait velocity: Decreased Gait velocity interpretation: <1.8 ft/sec, indicative of risk for recurrent falls General Gait Details: Pt provided education about proper sequencing with walker during forward/retro ambulation. She requires minA+1 as well as heavy UE support to offload RLE during ambulation. She is severely limited in how far she can ambulate due to RLE pain and bilateral UE weakness when attempting to offload  Stairs            Wheelchair Mobility    Modified Rankin (Stroke Patients Only)       Balance Overall balance assessment: Needs assistance Sitting-balance support: No upper extremity supported Sitting balance-Leahy Scale: Good     Standing balance support: Bilateral upper extremity supported Standing balance-Leahy Scale: Poor Standing balance comment: Requires bilateral UE support due to decreased weight shifting to RLE                             Pertinent Vitals/Pain Pain Assessment: Faces Faces Pain Scale: Hurts even more Pain Location:  R hip, denies pain at rest but reports pain once in standing with ambulation Pain Intervention(s): Monitored during session    [place] expects to be discharged to:: Private residence Living Arrangements: Alone Available Help at Discharge: Family Type of Home: House Home Access: Stairs to enter Entrance Stairs-Rails:  (One  small step (threshold) to enter home) Entrance Stairs-Number of Steps: 1 Home Layout: Multi-level;Able to live on main level with bedroom/bathroom Home Equipment: Gilford Rile - 2 wheels;Cane - single point;Shower seat (no grab bars, no wheelchair)      Prior Function Level of Independence: Independent         Comments: Previously independent with ADLs/IADLs. No falls. Doesn't drive. Just returned home from a beach trip with her family     Hand Dominance        Extremity/Trunk Assessment   Upper Extremity Assessment Upper Extremity Assessment: Generalized weakness;RUE deficits/detail RUE Deficits / Details: Generally strong but unable to provide adequate strength to significantly offload RLE during ambulation. Reports intact sensation to light touch bilateral LE    Lower Extremity Assessment Lower Extremity Assessment: RLE deficits/detail RLE Deficits / Details: Weakness in R hip flexion secondary to pain. LLE strength grossly WFL       Communication   Communication: No difficulties  Cognition Arousal/Alertness: Awake/alert Behavior During Therapy: WFL for tasks assessed/performed Overall Cognitive Status: Within Functional Limits for tasks assessed                                        General Comments      Exercises     Assessment/Plan    PT Assessment Patient needs continued PT services  PT Problem List Decreased strength;Decreased activity tolerance;Decreased balance;Decreased mobility;Pain;Decreased knowledge of use of DME       PT Treatment Interventions DME instruction;Gait training;Stair training;Functional mobility training;Therapeutic exercise;Therapeutic activities;Balance training;Neuromuscular re-education;Patient/family education    PT Goals (Current goals can be found in the Care Plan section)  Acute Rehab PT Goals Patient Stated Goal: Return to prior level of function at home PT Goal Formulation: With patient/family Time For Goal  Achievement: 04/15/17 Potential to Achieve Goals: Good    Frequency 7X/week   Barriers to discharge Decreased caregiver support Pt lives alone    Co-evaluation               AM-PAC PT "6 Clicks" Daily Activity  Outcome Measure Difficulty turning over in bed (including adjusting bedclothes, sheets and blankets)?: Total Difficulty moving from lying on back to sitting on the side of the bed? : Total Difficulty sitting down on and standing up from a chair with arms (e.g., wheelchair, bedside commode, etc,.)?: Total Help needed moving to and from a bed to chair (including a wheelchair)?: A Lot Help needed walking in hospital room?: A Lot Help needed climbing 3-5 steps with a railing? : Total 6 Click Score: 8    End of Session Equipment Utilized During Treatment: Gait belt Activity Tolerance: Patient tolerated treatment well Patient left: in bed;with call bell/phone within reach;with family/visitor present Nurse Communication: Mobility status;Other (comment) (MD notified of DC recommendations) PT Visit Diagnosis: Unsteadiness on feet (R26.81);History of falling (Z91.81);Muscle weakness (generalized) (M62.81);Pain Pain - Right/Left: Right Pain - part of body: Hip    Time: 8502-7741 PT Time Calculation (min) (ACUTE ONLY): 23 min   Charges:   PT Evaluation $PT Eval Low Complexity: 1 Low  PT G Codes:   PT G-Codes **NOT FOR INPATIENT CLASS** Functional Assessment Tool Used: AM-PAC 6 Clicks Basic Mobility Functional Limitation: Mobility: Walking and moving around Mobility: Walking and Moving Around Current Status (Q6578): At least 80 percent but less than 100 percent impaired, limited or restricted Mobility: Walking and Moving Around Goal Status [phone]): At least 20 percent but less than 40 percent impaired, limited or restricted    [place], DPT    Kimberlye Dilger 04/01/2017, 4:39 PM

## 2017-04-01 NOTE — ED Notes (Signed)
Report off to [place]

## 2017-04-02 ENCOUNTER — Encounter
Admission: RE | Admit: 2017-04-02 | Discharge: 2017-04-02 | Disposition: A | Discharge status: Home / Self Care | Payer: Medicare Other | Source: Ambulatory Visit | Attending: Internal Medicine | Admitting: Internal Medicine

## 2017-04-02 MED ORDER — TRAMADOL HCL 50 MG PO TABS: 50.0000 mg | ORAL_TABLET | Freq: Two times a day (BID) | ORAL | 0 refills | Status: DC | PRN

## 2017-04-02 MED ORDER — ACETAMINOPHEN 325 MG PO TABS: 650.0000 mg | ORAL_TABLET | Freq: Four times a day (QID) | ORAL | 0 refills | Status: DC | PRN

## 2017-04-02 NOTE — ED Notes (Signed)
Received report from [facility], care assumed.  Pt resting in bed.

## 2017-04-02 NOTE — ED Provider Notes (Signed)
[facility] accepted at [facility], Herbie [place], MD 04/02/17 1101

## 2017-04-02 NOTE — ED Notes (Signed)
Assisted patient to bedside commode

## 2017-04-02 NOTE — ED Notes (Signed)
Signature pad in room is not working, pt signed paper and will be scanned into chart.

## 2017-04-02 NOTE — ED Notes (Signed)
Called report to Customer service manager at [facility].  EMS at bedside to transport pt.

## 2017-04-02 NOTE — Progress Notes (Signed)
Physical Therapy Treatment Patient Details Name: Joanne [name] MRN: [id number] DOB: Feb 19, 1931 Today's Date: 04/02/2017    History of Present Illness Pt sufferred a fall with a non-displaced R inferior pubic rami fracture as well as R eight rib fracture and finger laceration. She is awaiting placement in SNF.     PT Comments    Pt remains very limited in ambulation secondary to weakness and severe R hip pain. She is able to ambulate very slowly to door and back to bed. Increase in pain to 10/10 with ambulation. Heavy cues and education about proper sequencing with rolling walker. Ambulation is very labored and pt is unable to ambulate further at this time. She is able to complete seated and supine exercises and demonstrates excellent motivation. She continues to requires SNF placement in order to facilitate safe return home. Pt will benefit from PT services to address deficits in strength, balance, and mobility in order to return to full function at home.   Follow Up Recommendations  SNF     Equipment Recommendations  None recommended by PT    Recommendations for Other Services       Precautions / Restrictions Precautions Precautions: Fall Restrictions Weight Bearing Restrictions: Yes RLE Weight Bearing: Weight bearing as tolerated    Mobility  Bed Mobility Overal bed mobility: Needs Assistance Bed Mobility: Supine to Sit;Sit to Supine     Supine to sit: Min assist Sit to supine: Mod assist   General bed mobility comments: Requires assist to manage bilateral LEs as well as support trunk during bed mobility. Increase in pain with all motion of RLE. Improved supine to sit today however pt continues to struggle to flex R hip when returning back to bed  Transfers Overall transfer level: Needs assistance Equipment used: Rolling walker (2 wheeled) Transfers: Sit to/from Stand Sit to Stand: Min assist         General transfer comment: Therapist stabilizes walker and allows  pt to pull up on walker. Increase in pain once in standing. Pt is stable in standing with bilateral UE support on walker  Ambulation/Gait Ambulation/Gait assistance: Min assist Ambulation Distance (Feet): 20 Feet Assistive device: Rolling walker (2 wheeled) Gait Pattern/deviations: Decreased step length - left;Decreased stance time - right;Decreased weight shift to right Gait velocity: Decreased Gait velocity interpretation: <1.8 ft/sec, indicative of risk for recurrent falls General Gait Details: Pt is able to ambulate very slowly to door and back to bed. Increase in pain to 10/10 with ambulation. Heavy cues and education about proper sequencing with rolling walker. Ambulation is very labored and pt is unable to ambulate further at this time   Financial trader Rankin (Stroke Patients Only)       Balance Overall balance assessment: Needs assistance Sitting-balance support: No upper extremity supported Sitting balance-Leahy Scale: Good     Standing balance support: Bilateral upper extremity supported Standing balance-Leahy Scale: Poor Standing balance comment: Requires bilateral UE support due to decreased weight shifting to RLE                            Cognition Arousal/Alertness: Awake/alert Behavior During Therapy: WFL for tasks assessed/performed Overall Cognitive Status: Within Functional Limits for tasks assessed  Exercises General Exercises - Lower Extremity Long Arc Quad: Strengthening;Both;15 reps;Seated Heel Slides: Strengthening;Both;15 reps;Seated Hip ABduction/ADduction: Strengthening;Both;15 reps;Seated Straight Leg Raises: Strengthening;Right;10 reps;Supine Hip Flexion/Marching: Strengthening;Both;15 reps;Seated Heel Raises: Strengthening;Both;15 reps;Seated    General Comments        Pertinent Vitals/Pain Pain Assessment: 0-10 Pain Score:  10-Worst pain ever Pain Location: R hip pain when in standing. No pain reported when in bed Pain Intervention(s): Monitored during session    Home Living                      Prior Function            PT Goals (current goals can now be found in the care plan section) Acute Rehab PT Goals Patient Stated Goal: Return to prior level of function at home PT Goal Formulation: With patient/family Time For Goal Achievement: 04/15/17 Potential to Achieve Goals: Good Progress towards PT goals: Progressing toward goals    Frequency    7X/week      PT Plan Current plan remains appropriate    Co-evaluation              AM-PAC PT "6 Clicks" Daily Activity  Outcome Measure  Difficulty turning over in bed (including adjusting bedclothes, sheets and blankets)?: Total Difficulty moving from lying on back to sitting on the side of the bed? : Total Difficulty sitting down on and standing up from a chair with arms (e.g., wheelchair, bedside commode, etc,.)?: Total Help needed moving to and from a bed to chair (including a wheelchair)?: A Lot Help needed walking in hospital room?: A Lot Help needed climbing 3-5 steps with a railing? : Total 6 Click Score: 8    End of Session Equipment Utilized During Treatment: Gait belt Activity Tolerance: Patient tolerated treatment well Patient left: in bed;with call bell/phone within reach Nurse Communication: Mobility status PT Visit Diagnosis: Unsteadiness on feet (R26.81);History of falling (Z91.81);Muscle weakness (generalized) (M62.81);Pain Pain - Right/Left: Right Pain - part of body: Hip     Time: 3794-3276 PT Time Calculation (min) (ACUTE ONLY): 25 min  Charges:  $Gait Training: 8-22 mins $Therapeutic Exercise: 8-22 mins                    G Codes:       Lyndel Safe Kendrell Lottman PT, DPT     Nathen Balaban 04/02/2017, 10:28 AM

## 2017-04-02 NOTE — ED Notes (Signed)
Called pt daughter Loletha Carrow, [phone] and notified pt was being transferred to [facility].

## 2017-04-02 NOTE — Progress Notes (Signed)
Patient has been assigned to room # 203B at [facility]. RN to call report to [phone]. CSW has made RN aware.    Lorrine Kin, MSW, [place] Clinical Social Worker [phone]

## 2017-04-02 NOTE — ED Notes (Signed)
Pt assisted to bedside commode, 2 person assist and walker used by pt.  Pt advised not to get up without assistance and to use call bell when finished.

## 2017-04-02 NOTE — ED Notes (Signed)
PT at bedside with pt.

## 2017-04-03 DIAGNOSIS — M8000XD Age-related osteoporosis with current pathological fracture, unspecified site, subsequent encounter for fracture with routine healing: Secondary | ICD-10-CM | POA: Insufficient documentation

## 2017-04-04 ENCOUNTER — Other Ambulatory Visit
Admission: RE | Admit: 2017-04-04 | Discharge: 2017-04-04 | Disposition: A | Discharge status: Home / Self Care | Payer: Medicare Other | Source: Other Acute Inpatient Hospital | Attending: Gerontology | Admitting: Gerontology

## 2017-04-04 DIAGNOSIS — K5080 Crohn's disease of both small and large intestine without complications: Secondary | ICD-10-CM | POA: Insufficient documentation

## 2017-04-04 LAB — CBC WITH DIFFERENTIAL/PLATELET
BASOS ABS: 0 10*3/uL (ref 0–0.1)
BASOS PCT: 1 %
Eosinophils Absolute: 0.1 10*3/uL (ref 0–0.7)
Eosinophils Relative: 2 %
HEMATOCRIT: 30.9 % — AB (ref 35.0–47.0)
HEMOGLOBIN: 10.4 g/dL — AB (ref 12.0–16.0)
LYMPHS PCT: 34 %
Lymphs Abs: 1.8 10*3/uL (ref 1.0–3.6)
MCH: 28.4 pg (ref 26.0–34.0)
MCHC: 33.6 g/dL (ref 32.0–36.0)
MCV: 84.5 fL (ref 80.0–100.0)
MONOS PCT: 9 %
Monocytes Absolute: 0.4 10*3/uL (ref 0.2–0.9)
NEUTROS ABS: 2.9 10*3/uL (ref 1.4–6.5)
NEUTROS PCT: 54 %
Platelets: 378 10*3/uL (ref 150–440)
RBC: 3.65 MIL/uL — ABNORMAL LOW (ref 3.80–5.20)
RDW: 14.7 % — ABNORMAL HIGH (ref 11.5–14.5)
WBC: 5.3 10*3/uL (ref 3.6–11.0)

## 2017-04-04 LAB — COMPREHENSIVE METABOLIC PANEL
ALBUMIN: 2.8 g/dL — AB (ref 3.5–5.0)
ALK PHOS: 55 U/L (ref 38–126)
ALT: 15 U/L (ref 14–54)
AST: 19 U/L (ref 15–41)
Anion gap: 9 (ref 5–15)
BILIRUBIN TOTAL: 0.5 mg/dL (ref 0.3–1.2)
BUN: 17 mg/dL (ref 6–20)
CALCIUM: 8.2 mg/dL — AB (ref 8.9–10.3)
CO2: 25 mmol/L (ref 22–32)
Chloride: 101 mmol/L (ref 101–111)
Creatinine, Ser: 0.63 mg/dL (ref 0.44–1.00)
GFR calc Af Amer: >60 mL/min (ref >60)
GFR calc non Af Amer: >60 mL/min (ref >60)
GLUCOSE: 104 mg/dL — AB (ref 65–99)
Potassium: 3.7 mmol/L (ref 3.5–5.1)
Sodium: 135 mmol/L (ref 135–145)
TOTAL PROTEIN: 5.9 g/dL — AB (ref 6.5–8.1)

## 2017-04-08 ENCOUNTER — Encounter: Payer: Self-pay | Admitting: Gerontology

## 2017-04-08 ENCOUNTER — Non-Acute Institutional Stay (SKILLED_NURSING_FACILITY): Payer: Medicare Other | Admitting: Gerontology

## 2017-04-08 DIAGNOSIS — S32591D Other specified fracture of right pubis, subsequent encounter for fracture with routine healing: Secondary | ICD-10-CM

## 2017-04-08 DIAGNOSIS — E46 Unspecified protein-calorie malnutrition: Secondary | ICD-10-CM | POA: Diagnosis not present

## 2017-04-08 NOTE — Progress Notes (Signed)
Location:   [facility] Room Number: 203B Place of Service:  SNF [phone]) Provider:  Toni Arthurs, NP-C  Tracie Harrier, MD  Patient Care Team: Tracie Harrier, MD as PCP - General (Internal Medicine) Tracie Harrier, MD as Physician Assistant (Internal Medicine) Bary Castilla Forest Gleason, MD (General Surgery)  Extended Emergency Contact Information Primary Emergency Contact: Price,Cynthia Address: [address]          [place], [place] [address] Johnnette Litter of [place] Mobile Phone: [phone] Relation: Daughter Secondary Emergency Contact: Crowder,Vicky Address: [address], [place] [address] Johnnette Litter of [employer] Phone: [phone] Relation: Daughter  Code Status:  FULL Goals of care: Advanced Directive information Advanced Directives 04/08/2017  Does Patient Have a Medical Advance Directive? No  Type of Advance Directive -  Does patient want to make changes to medical advance directive? -  Copy of [place] in Chart? -     Chief Complaint  Patient presents with  . Medical Management of Chronic Issues    Routine Visit    HPI:  Pt is a 83 y.o. female seen today for follow up. Pt was admitted to the facility for rehab following hospitalization for Right pubic rami fracture. Pt has been participating in PT and OT. Pt reports her pain is now fairly well controlled on current regimen. She reports her appetite is good. Having regular BMs. Pt denies n/v/d/f/c/cp/sob/ha/abd pain/dizziness/cough. On review of labs, pt was found to have a significant level of malnutrition. Nutritional supplements ordered for this to improve wound healing. VSS. No other complaints.    Past Medical History:  Diagnosis Date  . Anemia, unspecified    B12 and iron deficiency   . Arthritis   . Crohn disease ([place])   . Crohn's disease ([place])    followed by Dr. Vira Agar with serial colonoscopies  . Fuchs' endothelial  dystrophy   . History of bone density study 09/01/2012  . Hyperlipidemia, unspecified   . Hypertension   . Hypokalemia   . Hypomagnesemia   . Osteoporosis   . Other dysphagia 01/24/2017  . Retinal detachment   . Right shoulder injury 05/2002   treated conservatively  . Trigger finger   . [place]ker diverticulum 09/25/2016   Past Surgical History:  Procedure Laterality Date  . APPENDECTOMY  1949  . CATARACT EXTRACTION EXTRACAPSULAR Right 05/10/2015   Procedure: EXTRACTION CATARACT EXTRACAPSULAR W/INSERTION INTRAOCULAR PROSTHESIS; Surgeon: Doyce Para, MD; [facility]; Service: Ophthalmology; Laterality: Right;  . CHOLECYSTECTOMY  1992  . COLONOSCOPY     03/24/1992, 03/11/1998, 07/08/2002, 05/08/2006, 01/31/2012   PH Crohns disease; no repeat due to age per RTE (dw)  . CORNEAL TRANSPLANT Left 07/08/2012   Procedure: CORNEAL TRANSPLANT DSAEK ** tissue ordered OSI 06/27/2012; Surgeon: Doyce Para, MD; Location: [facility]; Service: Ophthalmology;  . ESOPHAGOGASTRODUODENOSCOPY     05/01/2005, 01/31/2012 ; No repeat per RTE  . EYE SURGERY  2012, 2013   5 total  . JOINT REPLACEMENT Right 01-28-13  . LENSECTOMY PHACOFRAGMENTATION WITH ASPIRATION Left 07/25/2011  . TONSILLECTOMY  1950  . VITREOUS RETINAL SURGERY Left 11/07/2011   PPV/SO  . VITREOUS RETINAL SURGERY Left 04/18/2011   TPPV/TRP/SO/SB/EL/C3F8    No Known Allergies  Allergies as of 04/08/2017   No Known Allergies     Medication List       Accurate as of 04/08/17 10:05 AM. Always use your most recent med list.  acetaminophen 500 MG tablet Commonly known as:  TYLENOL Take 500 mg by mouth 4 (four) times daily.   aspirin EC 325 MG tablet Take 325 mg by mouth 2 (two) times daily.   budesonide 3 MG 24 hr capsule Commonly known as:  ENTOCORT EC Take 3 mg by mouth 3 (three) times daily. Takes 1 capsule daily   CALCIUM 600 + D PO Take 1 tablet by mouth 3 (three) times daily.   ENSURE ENLIVE PO Take 1 Bottle  by mouth 2 (two) times daily between meals.   feeding supplement (PRO-STAT SUGAR FREE 64) Liqd Take 30 mLs by mouth 2 (two) times daily between meals.   gabapentin 100 MG capsule Commonly known as:  NEURONTIN Take 1 capsule by mouth 3 (three) times daily.   iron polysaccharides 150 MG capsule Commonly known as:  NIFEREX Take 150 mg by mouth daily.   magnesium oxide 400 MG tablet Commonly known as:  MAG-OX Take 400 mg by mouth 3 (three) times daily.   multivitamin tablet Take 1 tablet by mouth daily.   NEXIUM 40 MG capsule Generic drug:  esomeprazole Take 40 mg by mouth daily.   potassium chloride SA 20 MEQ tablet Commonly known as:  K-DUR,KLOR-CON Take 20 mEq by mouth 2 (two) times daily.   prednisoLONE acetate 1 % ophthalmic suspension Commonly known as:  PRED FORTE Place 1 drop into both eyes at bedtime.   torsemide 5 MG tablet Commonly known as:  DEMADEX Take 5 mg by mouth daily.   traMADol 50 MG tablet Commonly known as:  ULTRAM Take 50 mg by mouth every 4 (four) hours as needed.   vitamin B-12 1000 MCG tablet Commonly known as:  CYANOCOBALAMIN Take 1,000 mcg by mouth daily.   vitamin C 500 MG tablet Commonly known as:  ASCORBIC ACID Take 500 mg by mouth daily.       Review of Systems  Constitutional: Negative for activity change, appetite change, chills, diaphoresis and fever.  HENT: Negative for congestion, sneezing, sore throat, trouble swallowing and voice change.   Eyes: Negative for pain, redness and visual disturbance.  Respiratory: Negative for apnea, cough, choking, chest tightness, shortness of breath and wheezing.   Cardiovascular: Negative for chest pain, palpitations and leg swelling.  Gastrointestinal: Negative for abdominal distention, abdominal pain, constipation, diarrhea and nausea.  Genitourinary: Negative for difficulty urinating, dysuria, frequency and urgency.  Musculoskeletal: Negative for back pain, gait problem and myalgias.  Arthralgias: typical arthritis.  Skin: Negative for color change, pallor, rash and wound.  Neurological: Negative for dizziness, tremors, syncope, speech difficulty, weakness, numbness and headaches.  Psychiatric/Behavioral: Negative for agitation and behavioral problems.  All other systems reviewed and are negative.   Immunization History  Administered Date(s) Administered  . Influenza-Unspecified 06/03/2015, 05/21/2016  . Pneumococcal Conjugate-13 05/27/2014  . Pneumococcal Polysaccharide-23 10/28/2009  . Tdap 01/28/2017   Pertinent  Health Maintenance Due  Topic Date Due  . DEXA SCAN  06/20/1998  . INFLUENZA VACCINE  03/27/2017  . PNA vac Low Risk Adult  Completed   No flowsheet data found. Functional Status Survey:    Vitals:   04/08/17 0927  BP: (!) 150/72  Pulse: 68  Resp: 17  Temp: 98 F (36.7 C)  SpO2: 95%  Weight: 154 lb 15.7 oz (70.3 kg)  Height: 5' 1"  (1.549 m)   Body mass index is 29.28 kg/m. Physical Exam  Constitutional: She is oriented to person, place, and time. Vital signs are normal. She appears well-developed and  well-nourished. She is active and cooperative. She does not appear ill. No distress.  HENT:  Head: Normocephalic and atraumatic.  Mouth/Throat: Uvula is midline, oropharynx is clear and moist and mucous membranes are normal. Mucous membranes are not pale, not dry and not cyanotic.  Eyes: Pupils are equal, round, and reactive to light. Conjunctivae, EOM and lids are normal.  Neck: Trachea normal, normal range of motion and full passive range of motion without pain. Neck supple. No JVD present. No tracheal deviation, no edema and no erythema present. No thyromegaly present.  Cardiovascular: Normal rate, regular rhythm, normal heart sounds, intact distal pulses and normal pulses.  Exam reveals no gallop, no distant heart sounds and no friction rub.   No murmur heard. Pulmonary/Chest: Effort normal and breath sounds normal. No accessory muscle  usage. No respiratory distress. She has no wheezes. She has no rales. She exhibits no tenderness.  Abdominal: Normal appearance and bowel sounds are normal. She exhibits no distension and no ascites. There is no tenderness.  Musculoskeletal: Normal range of motion. She exhibits no edema.       Right hip: She exhibits decreased strength and tenderness.  Expected osteoarthritis, stiffness; calves soft, supple. Negative Homan's sign  Neurological: She is alert and oriented to person, place, and time. She has normal strength.  Skin: Skin is warm, dry and intact. No rash noted. She is not diaphoretic. No cyanosis or erythema. No pallor. Nails show no clubbing.  Psychiatric: She has a normal mood and affect. Her speech is normal and behavior is normal. Judgment and thought content normal. Cognition and memory are normal.  Nursing note and vitals reviewed.   Labs reviewed:  Recent Labs  01/22/17 1221 01/23/17 0624 04/01/17 1719 04/04/17 0545  NA 131* 137 139 135  K 2.1* 3.2* 3.3* 3.7  CL 96* 114* 100* 101  CO2 20* 17* 30 25  GLUCOSE 138* 99 94 104*  BUN 74* 48* 18 17  CREATININE 1.72* 0.94 0.65 0.63  CALCIUM 9.3 8.1* 9.3 8.2*  MG 0.7* 2.1  --   --     Recent Labs  01/22/17 1221 04/04/17 0545  AST 36 19  ALT 26 15  ALKPHOS 74 55  BILITOT 0.6 0.5  PROT 8.1 5.9*  ALBUMIN 3.6 2.8*    Recent Labs  01/22/17 1221 01/23/17 0624 04/01/17 1719 04/04/17 0545  WBC 12.5* 7.6 7.9 5.3  NEUTROABS 8.2*  --   --  2.9  HGB 12.9 11.5* 11.3* 10.4*  HCT 36.9 33.4* 34.2* 30.9*  MCV 83.3 85.4 85.2 84.5  PLT 618* 520* 407 378   No results found for: TSH No results found for: HGBA1C No results found for: CHOL, HDL, LDLCALC, LDLDIRECT, TRIG, CHOLHDL  Significant Diagnostic Results in last 30 days:  Dg Ribs Unilateral W/chest Right  Result Date: 04/01/2017 CLINICAL DATA:  Fell at 2230 hours last night getting out of a car landing on RIGHT hip, struck head on RIGHT side, RIGHT elbow  bruising, skin tear RIGHT hand, pain with transverse and ambulation EXAM: RIGHT RIBS AND CHEST - 3+ VIEW COMPARISON:  02/27/2007 chest radiograph FINDINGS: Enlargement of cardiac silhouette. Atherosclerotic calcification aorta. Mediastinal contours and pulmonary vascularity normal. Bronchitic changes, chronic. Lungs otherwise clear. No pulmonary infiltrate, pleural effusion or pneumothorax. Diffuse osseous demineralization. Old healed posttraumatic deformity of the proximal RIGHT humerus. BB placed at site of symptoms lower RIGHT chest. Minimally displaced fracture at the anterior RIGHT eighth rib. IMPRESSION: Osseous demineralization with a minimally displaced fracture at the  anterior RIGHT eighth rib. Minimal enlargement of cardiac silhouette. Mild chronic bronchitic changes. Aortic Atherosclerosis (ICD10-I70.0). Electronically Signed   By: Lavonia Dana M.D.   On: 04/01/2017 11:32   Ct Head Wo Contrast  Result Date: 04/01/2017 CLINICAL DATA:  Head trauma.  Fall last night.  Headache. EXAM: CT HEAD WITHOUT CONTRAST TECHNIQUE: Contiguous axial images were obtained from the base of the skull through the vertex without intravenous contrast. COMPARISON:  02/27/2007 FINDINGS: Brain: There is moderate lateral and third ventriculomegaly which has slightly progressed from 2008 and which is out of proportion to the sulci. There is some crowding of the gyri at the vertex. There is minimal nonspecific periventricular white matter hypoattenuation. No acute cortically based infarct, intracranial hemorrhage, mass, midline shift, or extra-axial fluid collection is identified. Vascular: Calcified atherosclerosis at the skullbase. No hyperdense vessel. Skull: No fracture or focal osseous lesion. Sinuses/Orbits: The visualized paranasal sinuses and mastoid air cells are clear. Postoperative changes to the globes. Other: None. IMPRESSION: 1. No evidence of acute intracranial abnormality. 2. Moderate ventriculomegaly, slightly  progressed from 2008 and which may reflect normal pressure hydrocephalus in the appropriate clinical setting. Electronically Signed   By: Logan Bores M.D.   On: 04/01/2017 12:27   Ct Pelvis Wo Contrast  Result Date: 04/01/2017 CLINICAL DATA:  Right hip injury due to a fall getting out of a car last night at 10:30 p.m. Initial encounter. EXAM: CT PELVIS WITHOUT CONTRAST TECHNIQUE: Multidetector CT imaging of the pelvis was performed following the standard protocol without intravenous contrast. COMPARISON:  None. FINDINGS: Urinary Tract:  Negative. Bowel: Mildly dilated loops of small and large bowel are identified. High attenuation material is seen within bowel loops. Vascular/Lymphatic: Atherosclerosis is noted.  No lymphadenopathy. Reproductive:  No mass or other significant abnormality Other:  No fluid collection. Musculoskeletal: There is an acute nondisplaced fracture of the right inferior pubic ramus. No other fracture is identified. There is some degenerative change about the SI joints and symphysis pubis. Right hip arthroplasty is in place. Facet degenerative change lower lumbar spine is noted. IMPRESSION: Acute nondisplaced right inferior pubic ramus fracture. Mildly dilated loops of small and large bowel may be due to ileus. If indicated, plain films of the abdomen could be obtained for further evaluation. Atherosclerosis. Electronically Signed   By: Inge Rise M.D.   On: 04/01/2017 12:26   Dg Hand Complete Right  Result Date: 04/01/2017 CLINICAL DATA:  [place] at 2230 hours last night getting out of a car landing on RIGHT hip, struck head on RIGHT side, RIGHT elbow bruising, skin tear RIGHT hand, pain with transverse and ambulation EXAM: RIGHT HAND - COMPLETE 3+ VIEW COMPARISON:  RIGHT wrist radiographs 02/24/2007 FINDINGS: Diffuse osseous demineralization. Joint spaces preserved. Chondrocalcinosis at carpus. No acute fracture, dislocation, or bone destruction. IMPRESSION: No acute osseous  abnormalities. 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 Electronically Signed   By: Lavonia Dana M.D.   On: 04/01/2017 11:34   Dg Hip Unilat W Or Wo Pelvis 2-3 Views Right  Result Date: 04/01/2017 CLINICAL DATA:  [place] at 2230 hours last night getting out of a car landing on RIGHT hip, struck head on RIGHT side, RIGHT elbow bruising, skin tear RIGHT hand, pain with transverse and ambulation EXAM: DG HIP (WITH OR WITHOUT PELVIS) 2-3V RIGHT COMPARISON:  01/28/2013 FINDINGS: RIGHT hip prosthesis. Diffuse osseous demineralization. Age-indeterminate nondisplaced fracture RIGHT inferior pubic ramus. No additional fracture, dislocation, or bone destruction. Visualized proximal RIGHT femur appears intact. IMPRESSION: Age-indeterminate nondisplaced fracture of the RIGHT inferior pubic  ramus. Osseous demineralization. Electronically Signed   By: Lavonia Dana M.D.   On: 04/01/2017 11:30    Assessment/Plan 1. Closed fracture of multiple pubic rami, right, with routine healing, subsequent encounter  Continue PT/OT  Continue exercises as taught by PT and OT  Ice pack to the hip prn for pain  Continue Tylenol 500 mg po QID  Continue Tramadol 50 mg po Q 4 hours prn  2. Protein-calorie malnutrition, unspecified severity (HCC)  Continue Pro-Stat 30 mL po BID  Continue Ensure Enlive 1 bottle po BID  Continue MVI po Q Day   Family/ staff Communication:   Total Time:  Documentation:  Face to Face:  Family/Phone:   Labs/tests ordered:    Medication list reviewed and assessed for continued appropriateness. Monthly medication orders reviewed and signed.  Vikki Ports, NP-C Geriatrics [facility] [phone], [place] [address] Cell Phone (Mon-Fri 8am-5pm):  [phone] On Call:  [phone] & follow prompts after 5pm & weekends Office Phone:  [phone] Office Fax:  [phone]

## 2017-04-21 DIAGNOSIS — E46 Unspecified protein-calorie malnutrition: Secondary | ICD-10-CM | POA: Insufficient documentation

## 2017-04-25 ENCOUNTER — Encounter: Payer: Self-pay | Admitting: Gerontology

## 2017-04-25 ENCOUNTER — Non-Acute Institutional Stay (SKILLED_NURSING_FACILITY): Payer: Medicare Other | Admitting: Gerontology

## 2017-04-25 DIAGNOSIS — E46 Unspecified protein-calorie malnutrition: Secondary | ICD-10-CM

## 2017-04-25 DIAGNOSIS — S32591D Other specified fracture of right pubis, subsequent encounter for fracture with routine healing: Secondary | ICD-10-CM | POA: Diagnosis not present

## 2017-04-25 NOTE — Progress Notes (Signed)
Location:   [facility] Room Number: 203B Place of Service:  SNF [phone])  Provider: Toni Arthurs, NP-C  PCP: Tracie Harrier, MD Patient Care Team: Tracie Harrier, MD as PCP - General (Internal Medicine) Tracie Harrier, MD as Physician Assistant (Internal Medicine) Bary Castilla Forest Gleason, MD (General Surgery)  Extended Emergency Contact Information Primary Emergency Contact: Price,Cynthia Address: [address]          [place], [place] [address] Johnnette Litter of [place] Mobile Phone: [phone] Relation: Daughter Secondary Emergency Contact: Crowder,Vicky Address: [address], [place] [address] Johnnette Litter of [employer] Phone: [phone] Relation: Daughter  Code Status: Full Goals of care:  Advanced Directive information Advanced Directives 04/25/2017  Does Patient Have a Medical Advance Directive? No  Type of Advance Directive -  Does patient want to make changes to medical advance directive? -  Copy of [place] in Chart? -     No Known Allergies  Chief Complaint  Patient presents with  . Discharge Note    Discharged from SNF    HPI:  83 y.o. female seen today for discharge evaluation. Pt was admitted to the facility for rehab following hospitalization for Right pubic rami fracture. Pt has been participating in PT and OT. Pt reports her pain is now fairly well controlled on current regimen. She reports her appetite is good. Having regular BMs. Pt denies n/v/d/f/c/cp/sob/ha/abd pain/dizziness/cough. On review of labs, pt was found to have a significant level of malnutrition. Nutritional supplements ordered for this to improve wound healing. Pt reports she is feeling well and ready for discharge home. VSS. No other complaints.      Past Medical History:  Diagnosis Date  . Anemia, unspecified    B12 and iron deficiency   . Arthritis   . Crohn disease ([place])   . Crohn's disease ([place])    followed by Dr. Vira Agar with serial colonoscopies  . Fuchs' endothelial dystrophy   . History of bone density study 09/01/2012  . Hyperlipidemia, unspecified   . Hypertension   . Hypokalemia   . Hypomagnesemia   . Osteoporosis   . Other dysphagia 01/24/2017  . Retinal detachment   . Right shoulder injury 05/2002   treated conservatively  . Trigger finger   . [place] diverticulum 09/25/2016    Past Surgical History:  Procedure Laterality Date  . APPENDECTOMY  1949  . CATARACT EXTRACTION EXTRACAPSULAR Right 05/10/2015   Procedure: EXTRACTION CATARACT EXTRACAPSULAR W/INSERTION INTRAOCULAR PROSTHESIS; Surgeon: Doyce Para, MD; [facility]; Service: Ophthalmology; Laterality: Right;  . CHOLECYSTECTOMY  1992  . COLONOSCOPY     03/24/1992, 03/11/1998, 07/08/2002, 05/08/2006, 01/31/2012   PH Crohns disease; no repeat due to age per RTE (dw)  . CORNEAL TRANSPLANT Left 07/08/2012   Procedure: CORNEAL TRANSPLANT DSAEK ** tissue ordered OSI 06/27/2012; Surgeon: Doyce Para, MD; Location: [facility]; Service: Ophthalmology;  . ESOPHAGOGASTRODUODENOSCOPY     05/01/2005, 01/31/2012 ; No repeat per RTE  . EYE SURGERY  2012, 2013   5 total  . JOINT REPLACEMENT Right 01-28-13  . LENSECTOMY PHACOFRAGMENTATION WITH ASPIRATION Left 07/25/2011  . TONSILLECTOMY  1950  . VITREOUS RETINAL SURGERY Left 11/07/2011   PPV/SO  . VITREOUS RETINAL SURGERY Left 04/18/2011   TPPV/TRP/SO/SB/EL/C3F8      reports that she quit smoking about 26 years ago. Her smoking use included Cigarettes. She has a 17.50 pack-year smoking history. She has never used smokeless  tobacco. She reports that she drinks alcohol. She reports that she does not use drugs. Social History   Social History  . Marital status: Widowed    Spouse name: N/A  . Number of children: 2  . Years of education: college   Occupational History  . retired     Pharmacist, hospital   Social History Main Topics  . Smoking status: Former Smoker    Packs/day:  0.50    Years: 35.00    Types: Cigarettes    Quit date: 01/29/1991  . Smokeless tobacco: Never Used  . Alcohol use Yes     Comment: 3x/yr  . Drug use: No  . Sexual activity: No   Other Topics Concern  . Not on file   Social History Narrative   Widowed   Former smoker   Occasional drink 3 x a year   2 children    Full Code   Functional Status Survey:    No Known Allergies  Pertinent  Health Maintenance Due  Topic Date Due  . DEXA SCAN  06/20/1998  . INFLUENZA VACCINE  03/27/2017  . PNA vac Low Risk Adult  Completed    Medications: Allergies as of 04/25/2017   No Known Allergies     Medication List       Accurate as of 04/25/17 10:14 AM. Always use your most recent med list.          acetaminophen 500 MG tablet Commonly known as:  TYLENOL Take 500 mg by mouth 4 (four) times daily.   Acidophilus Lactobacillus Caps Take 1 capsule by mouth daily.   aspirin EC 325 MG tablet Take 325 mg by mouth 2 (two) times daily.   budesonide 3 MG 24 hr capsule Commonly known as:  ENTOCORT EC Take 3 mg by mouth 3 (three) times daily. 1 cap   CALCIUM 600 + D PO Take 1 tablet by mouth 3 (three) times daily.   cholecalciferol 1000 units tablet Commonly known as:  VITAMIN D Take 3,000 Units by mouth daily. 3 tabs   ENSURE ENLIVE PO Take 1 Bottle by mouth 2 (two) times daily between meals.   feeding supplement (PRO-STAT SUGAR FREE 64) Liqd Take 30 mLs by mouth 2 (two) times daily between meals.   gabapentin 100 MG capsule Commonly known as:  NEURONTIN Take 1 capsule by mouth 3 (three) times daily.   iron polysaccharides 150 MG capsule Commonly known as:  NIFEREX Take 150 mg by mouth daily.   magnesium oxide 400 MG tablet Commonly known as:  MAG-OX Take 400 mg by mouth 3 (three) times daily.   multivitamin tablet Take 1 tablet by mouth daily.   NEXIUM 40 MG capsule Generic drug:  esomeprazole Take 40 mg by mouth daily.   potassium chloride SA 20 MEQ  tablet Commonly known as:  K-DUR,KLOR-CON Take 20 mEq by mouth 2 (two) times daily.   prednisoLONE acetate 1 % ophthalmic suspension Commonly known as:  PRED FORTE Place 1 drop into both eyes at bedtime.   torsemide 5 MG tablet Commonly known as:  DEMADEX Take 5 mg by mouth daily.   traMADol 50 MG tablet Commonly known as:  ULTRAM Take 50 mg by mouth every 4 (four) hours as needed.   vitamin B-12 1000 MCG tablet Commonly known as:  CYANOCOBALAMIN Take 1,000 mcg by mouth daily.   vitamin C 500 MG tablet Commonly known as:  ASCORBIC ACID Take 500 mg by mouth daily.       Review  of Systems  Constitutional: Negative for activity change, appetite change, chills, diaphoresis and fever.  HENT: Negative for congestion, sneezing, sore throat, trouble swallowing and voice change.   Respiratory: Negative for apnea, cough, choking, chest tightness, shortness of breath and wheezing.   Cardiovascular: Negative for chest pain, palpitations and leg swelling.  Gastrointestinal: Negative for abdominal distention, abdominal pain, constipation, diarrhea and nausea.  Genitourinary: Negative for difficulty urinating, dysuria, frequency and urgency.  Musculoskeletal: Positive for arthralgias (typical arthritis). Negative for back pain, gait problem and myalgias.  Skin: Negative for color change, pallor, rash and wound.  Neurological: Negative for dizziness, tremors, syncope, speech difficulty, weakness, numbness and headaches.  Psychiatric/Behavioral: Negative for agitation and behavioral problems.  All other systems reviewed and are negative.   Vitals:   04/25/17 0955  BP: (!) 146/88  Pulse: 80  Resp: 18  Temp: 97.8 F (36.6 C)  SpO2: 96%  Weight: 159 lb 3.2 oz (72.2 kg)  Height: 5' 1"  (1.549 m)   Body mass index is 30.08 kg/m. Physical Exam  Constitutional: She is oriented to person, place, and time. Vital signs are normal. She appears well-developed and well-nourished. She is  active and cooperative. She does not appear ill. No distress.  HENT:  Head: Normocephalic and atraumatic.  Mouth/Throat: Uvula is midline, oropharynx is clear and moist and mucous membranes are normal. Mucous membranes are not pale, not dry and not cyanotic.  Eyes: Pupils are equal, round, and reactive to light. Conjunctivae, EOM and lids are normal.  Neck: Trachea normal, normal range of motion and full passive range of motion without pain. Neck supple. No JVD present. No tracheal deviation, no edema and no erythema present. No thyromegaly present.  Cardiovascular: Normal rate, regular rhythm, normal heart sounds, intact distal pulses and normal pulses.  Exam reveals no gallop, no distant heart sounds and no friction rub.   No murmur heard. Pulmonary/Chest: Effort normal and breath sounds normal. No accessory muscle usage. No respiratory distress. She has no wheezes. She has no rales. She exhibits no tenderness.  Abdominal: Normal appearance and bowel sounds are normal. She exhibits no distension and no ascites. There is no tenderness.  Musculoskeletal: Normal range of motion. She exhibits no edema.       Right hip: She exhibits decreased strength and tenderness.  Expected osteoarthritis, stiffness; calves soft, supple. Negative Homan's sign  Neurological: She is alert and oriented to person, place, and time. She has normal strength.  Skin: Skin is warm, dry and intact. No rash noted. She is not diaphoretic. No cyanosis or erythema. No pallor. Nails show no clubbing.  Psychiatric: She has a normal mood and affect. Her speech is normal and behavior is normal. Judgment and thought content normal. Cognition and memory are normal.  Nursing note and vitals reviewed.   Labs reviewed: Basic Metabolic Panel:  Recent Labs  01/22/17 1221 01/23/17 0624 04/01/17 1719 04/04/17 0545  NA 131* 137 139 135  K 2.1* 3.2* 3.3* 3.7  CL 96* 114* 100* 101  CO2 20* 17* 30 25  GLUCOSE 138* 99 94 104*  BUN  74* 48* 18 17  CREATININE 1.72* 0.94 0.65 0.63  CALCIUM 9.3 8.1* 9.3 8.2*  MG 0.7* 2.1  --   --    Liver Function Tests:  Recent Labs  01/22/17 1221 04/04/17 0545  AST 36 19  ALT 26 15  ALKPHOS 74 55  BILITOT 0.6 0.5  PROT 8.1 5.9*  ALBUMIN 3.6 2.8*   No results for input(s):  LIPASE, AMYLASE in the last 8760 hours. No results for input(s): AMMONIA in the last 8760 hours. CBC:  Recent Labs  01/22/17 1221 01/23/17 0624 04/01/17 1719 04/04/17 0545  WBC 12.5* 7.6 7.9 5.3  NEUTROABS 8.2*  --   --  2.9  HGB 12.9 11.5* 11.3* 10.4*  HCT 36.9 33.4* 34.2* 30.9*  MCV 83.3 85.4 85.2 84.5  PLT 618* 520* 407 378   Cardiac Enzymes:  Recent Labs  01/22/17 1221  TROPONINI <0.03   BNP: Invalid input(s): POCBNP CBG:  Recent Labs  01/22/17 1232  GLUCAP 131*    Procedures and Imaging Studies During Stay: Dg Ribs Unilateral W/chest Right  Result Date: 04/01/2017 CLINICAL DATA:  [place] at 2230 hours last night getting out of a car landing on RIGHT hip, struck head on RIGHT side, RIGHT elbow bruising, skin tear RIGHT hand, pain with transverse and ambulation EXAM: RIGHT RIBS AND CHEST - 3+ VIEW COMPARISON:  02/27/2007 chest radiograph FINDINGS: Enlargement of cardiac silhouette. Atherosclerotic calcification aorta. Mediastinal contours and pulmonary vascularity normal. Bronchitic changes, chronic. Lungs otherwise clear. No pulmonary infiltrate, pleural effusion or pneumothorax. Diffuse osseous demineralization. Old healed posttraumatic deformity of the proximal RIGHT humerus. BB placed at site of symptoms lower RIGHT chest. Minimally displaced fracture at the anterior RIGHT eighth rib. IMPRESSION: Osseous demineralization with a minimally displaced fracture at the anterior RIGHT eighth rib. Minimal enlargement of cardiac silhouette. Mild chronic bronchitic changes. Aortic Atherosclerosis (ICD10-I70.0). Electronically Signed   By: Lavonia Dana M.D.   On: 04/01/2017 11:32   Ct Head Wo  Contrast  Result Date: 04/01/2017 CLINICAL DATA:  Head trauma.  Fall last night.  Headache. EXAM: CT HEAD WITHOUT CONTRAST TECHNIQUE: Contiguous axial images were obtained from the base of the skull through the vertex without intravenous contrast. COMPARISON:  02/27/2007 FINDINGS: Brain: There is moderate lateral and third ventriculomegaly which has slightly progressed from 2008 and which is out of proportion to the sulci. There is some crowding of the gyri at the vertex. There is minimal nonspecific periventricular white matter hypoattenuation. No acute cortically based infarct, intracranial hemorrhage, mass, midline shift, or extra-axial fluid collection is identified. Vascular: Calcified atherosclerosis at the skullbase. No hyperdense vessel. Skull: No fracture or focal osseous lesion. Sinuses/Orbits: The visualized paranasal sinuses and mastoid air cells are clear. Postoperative changes to the globes. Other: None. IMPRESSION: 1. No evidence of acute intracranial abnormality. 2. Moderate ventriculomegaly, slightly progressed from 2008 and which may reflect normal pressure hydrocephalus in the appropriate clinical setting. Electronically Signed   By: Logan Bores M.D.   On: 04/01/2017 12:27   Ct Pelvis Wo Contrast  Result Date: 04/01/2017 CLINICAL DATA:  Right hip injury due to a fall getting out of a car last night at 10:30 p.m. Initial encounter. EXAM: CT PELVIS WITHOUT CONTRAST TECHNIQUE: Multidetector CT imaging of the pelvis was performed following the standard protocol without intravenous contrast. COMPARISON:  None. FINDINGS: Urinary Tract:  Negative. Bowel: Mildly dilated loops of small and large bowel are identified. High attenuation material is seen within bowel loops. Vascular/Lymphatic: Atherosclerosis is noted.  No lymphadenopathy. Reproductive:  No mass or other significant abnormality Other:  No fluid collection. Musculoskeletal: There is an acute nondisplaced fracture of the right inferior  pubic ramus. No other fracture is identified. There is some degenerative change about the SI joints and symphysis pubis. Right hip arthroplasty is in place. Facet degenerative change lower lumbar spine is noted. IMPRESSION: Acute nondisplaced right inferior pubic ramus fracture. Mildly dilated loops of  small and large bowel may be due to ileus. If indicated, plain films of the abdomen could be obtained for further evaluation. Atherosclerosis. Electronically Signed   By: Inge Rise M.D.   On: 04/01/2017 12:26   Dg Hand Complete Right  Result Date: 04/01/2017 CLINICAL DATA:  [place] at 2230 hours last night getting out of a car landing on RIGHT hip, struck head on RIGHT side, RIGHT elbow bruising, skin tear RIGHT hand, pain with transverse and ambulation EXAM: RIGHT HAND - COMPLETE 3+ VIEW COMPARISON:  RIGHT wrist radiographs 02/24/2007 FINDINGS: Diffuse osseous demineralization. Joint spaces preserved. Chondrocalcinosis at carpus. No acute fracture, dislocation, or bone destruction. IMPRESSION: No acute osseous abnormalities. 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 Electronically Signed   By: Lavonia Dana M.D.   On: 04/01/2017 11:34   Dg Hip Unilat W Or Wo Pelvis 2-3 Views Right  Result Date: 04/01/2017 CLINICAL DATA:  [place] at 2230 hours last night getting out of a car landing on RIGHT hip, struck head on RIGHT side, RIGHT elbow bruising, skin tear RIGHT hand, pain with transverse and ambulation EXAM: DG HIP (WITH OR WITHOUT PELVIS) 2-3V RIGHT COMPARISON:  01/28/2013 FINDINGS: RIGHT hip prosthesis. Diffuse osseous demineralization. Age-indeterminate nondisplaced fracture RIGHT inferior pubic ramus. No additional fracture, dislocation, or bone destruction. Visualized proximal RIGHT femur appears intact. IMPRESSION: Age-indeterminate nondisplaced fracture of the RIGHT inferior pubic ramus. Osseous demineralization. Electronically Signed   By: Lavonia Dana M.D.   On: 04/01/2017 11:30    Assessment/Plan:   1. Closed fracture of  multiple pubic rami, right, with routine healing, subsequent encounter  Continue PT/OT  Continue exercises as taught by PT and OT  Ice pack to the hip prn for pain  Continue Tylenol 500 mg po QID  Continue Tramadol 50 mg po Q 4 hours prn  Follow up ordthopedist asap after discharge for continuity of care  2. Protein-calorie malnutrition, unspecified severity (HCC)  Continue Pro-Stat 30 mL po BID  Continue Ensure Enlive 1 bottle po BID  Continue MVI po Q Day  Follow up with PCP asap after discharge for continuity of care   Patient is being discharged with the following home health services: HHPT/OT through [facility]   Patient is being discharged with the following durable medical equipment:    Patient has been advised to f/u with their PCP in 1-2 weeks to bring them up to date on their rehab stay.  Social services at facility was responsible for arranging this appointment.  Pt was provided with a 30 day supply of prescriptions for medications and refills must be obtained from their PCP.  For controlled substances, a more limited supply may be provided adequate until PCP appointment only.  Future labs/tests needed:  Family/ staff Communication:   Total Time:  Documentation:  Face to Face:  Family/Phone:  Vikki Ports, NP-C [facility] [address],  [address] Cell Phone (Mon-Fri 8am-5pm):  [phone] On Call:  [phone] & follow prompts after 5pm & weekends Office Phone:  [phone] Office Fax:  [phone]

## 2017-10-07 ENCOUNTER — Other Ambulatory Visit: Payer: Self-pay | Admitting: Physician Assistant

## 2017-10-07 DIAGNOSIS — M167 Other unilateral secondary osteoarthritis of hip: Secondary | ICD-10-CM

## 2017-10-12 ENCOUNTER — Ambulatory Visit
Admission: RE | Admit: 2017-10-12 | Discharge: 2017-10-12 | Disposition: A | Discharge status: Home / Self Care | Payer: Medicare Other | Source: Ambulatory Visit | Attending: Physician Assistant | Admitting: Physician Assistant

## 2017-10-12 DIAGNOSIS — M879 Osteonecrosis, unspecified: Secondary | ICD-10-CM | POA: Diagnosis not present

## 2017-10-12 DIAGNOSIS — M167 Other unilateral secondary osteoarthritis of hip: Secondary | ICD-10-CM | POA: Diagnosis present

## 2017-10-12 DIAGNOSIS — M25452 Effusion, left hip: Secondary | ICD-10-CM | POA: Diagnosis not present

## 2017-10-12 DIAGNOSIS — Z96641 Presence of right artificial hip joint: Secondary | ICD-10-CM | POA: Diagnosis not present

## 2017-12-02 DIAGNOSIS — R0609 Other forms of dyspnea: Secondary | ICD-10-CM

## 2017-12-02 DIAGNOSIS — R0602 Shortness of breath: Secondary | ICD-10-CM | POA: Insufficient documentation

## 2017-12-10 ENCOUNTER — Encounter
Admission: RE | Admit: 2017-12-10 | Discharge: 2017-12-10 | Disposition: A | Discharge status: Home / Self Care | Payer: Medicare Other | Source: Ambulatory Visit | Attending: Orthopedic Surgery | Admitting: Orthopedic Surgery

## 2017-12-10 ENCOUNTER — Other Ambulatory Visit: Payer: Self-pay

## 2017-12-10 DIAGNOSIS — Z01812 Encounter for preprocedural laboratory examination: Secondary | ICD-10-CM | POA: Diagnosis present

## 2017-12-10 HISTORY — DX: Pneumonia, unspecified organism: J18.9

## 2017-12-10 HISTORY — DX: Gastro-esophageal reflux disease without esophagitis: K21.9

## 2017-12-10 LAB — COMPREHENSIVE METABOLIC PANEL
ALBUMIN: 3.5 g/dL (ref 3.5–5.0)
ALT: 17 U/L (ref 14–54)
AST: 22 U/L (ref 15–41)
Alkaline Phosphatase: 53 U/L (ref 38–126)
Anion gap: 7 (ref 5–15)
BUN: 21 mg/dL — AB (ref 6–20)
CHLORIDE: 98 mmol/L — AB (ref 101–111)
CO2: 33 mmol/L — AB (ref 22–32)
CREATININE: 0.65 mg/dL (ref 0.44–1.00)
Calcium: 9.7 mg/dL (ref 8.9–10.3)
GFR calc Af Amer: >60 mL/min (ref >60)
GFR calc non Af Amer: >60 mL/min (ref >60)
GLUCOSE: 112 mg/dL — AB (ref 65–99)
Potassium: 3.3 mmol/L — ABNORMAL LOW (ref 3.5–5.1)
SODIUM: 138 mmol/L (ref 135–145)
Total Bilirubin: 0.4 mg/dL (ref 0.3–1.2)
Total Protein: 7 g/dL (ref 6.5–8.1)

## 2017-12-10 LAB — CBC WITH DIFFERENTIAL/PLATELET
BASOS ABS: 0 10*3/uL (ref 0–0.1)
BASOS PCT: 1 %
EOS ABS: 0.1 10*3/uL (ref 0–0.7)
Eosinophils Relative: 1 %
HCT: 35.2 % (ref 35.0–47.0)
HEMOGLOBIN: 11.8 g/dL — AB (ref 12.0–16.0)
LYMPHS ABS: 1.5 10*3/uL (ref 1.0–3.6)
Lymphocytes Relative: 17 %
MCH: 28.2 pg (ref 26.0–34.0)
MCHC: 33.4 g/dL (ref 32.0–36.0)
MCV: 84.5 fL (ref 80.0–100.0)
Monocytes Absolute: 0.6 10*3/uL (ref 0.2–0.9)
Monocytes Relative: 7 %
NEUTROS PCT: 74 %
Neutro Abs: 6.4 10*3/uL (ref 1.4–6.5)
PLATELETS: 361 10*3/uL (ref 150–440)
RBC: 4.17 MIL/uL (ref 3.80–5.20)
RDW: 16.6 % — ABNORMAL HIGH (ref 11.5–14.5)
WBC: 8.6 10*3/uL (ref 3.6–11.0)

## 2017-12-10 LAB — URINALYSIS, ROUTINE W REFLEX MICROSCOPIC
Bilirubin Urine: NEGATIVE
GLUCOSE, UA: NEGATIVE mg/dL
Hgb urine dipstick: NEGATIVE
KETONES UR: NEGATIVE mg/dL
LEUKOCYTES UA: NEGATIVE
Nitrite: NEGATIVE
PROTEIN: NEGATIVE mg/dL
Specific Gravity, Urine: 1.012 (ref 1.005–1.030)
pH: 7 (ref 5.0–8.0)

## 2017-12-10 LAB — TYPE AND SCREEN
ABO/RH(D): O POS
Antibody Screen: NEGATIVE

## 2017-12-10 LAB — PROTIME-INR
INR: 0.91
Prothrombin Time: 12.2 seconds (ref 11.4–15.2)

## 2017-12-10 LAB — SEDIMENTATION RATE: Sed Rate: 47 mm/hr — ABNORMAL HIGH (ref 0–30)

## 2017-12-10 LAB — C-REACTIVE PROTEIN: CRP: 1 mg/dL — ABNORMAL HIGH (ref <1.0)

## 2017-12-10 LAB — APTT: APTT: 27 s (ref 24–36)

## 2017-12-10 NOTE — Patient Instructions (Addendum)
  Your procedure is scheduled on: Monday December 23, 2017 Report to Same Day Surgery 2nd floor medical mall (Winter Entrance-take elevator on left to 2nd floor.  Check in with surgery information desk.) To find out your arrival time please call [phone] between 1PM - 3PM on Friday December 20, 2017  Remember: Instructions that are not followed completely may result in serious medical risk, up to and including death, or upon the discretion of your surgeon and anesthesiologist your surgery may need to be rescheduled.    _x___ 1. Do not eat food after midnight the night before your procedure. You may drink clear liquids up to 2 hours before you are scheduled to arrive at the hospital for your procedure.  Do not drink clear liquids within 2 hours of your scheduled arrival to the hospital.  Clear liquids include  --Water or Apple juice without pulp  --Clear carbohydrate beverage such as Gatorade  --Black Coffee or Clear Tea (No milk, no creamers, do not add anything to the coffee or tea   No gum chewing or hard candies.      __x__ 2. No Alcohol for 24 hours before or after surgery.   __x__3. No Smoking or e-cigarettes for 24 prior to surgery.  Do not use any chewable tobacco products for at least 6 hour prior to surgery   ____  4. Bring all medications with you on the day of surgery if instructed.    __x__ 5. Notify your doctor if there is any change in your medical condition     (cold, fever, infections).   __x__6. On the morning of surgery brush your teeth with toothpaste and water.  You may rinse your mouth with mouth wash if you wish.  Do not swallow any toothpaste or mouthwash.   Do not wear jewelry, make-up, hairpins, clips or nail polish.  Do not wear lotions, powders, deodorant, or perfumes.   Do not shave 48 hours prior to surgery.   Do not bring valuables to the hospital.    [facility] is not responsible for any belongings or valuables.               Contacts, dentures  or bridgework may not be worn into surgery.  Leave your suitcase in the car. After surgery it may be brought to your room.  For patients admitted to the hospital, discharge time is determined by your treatment team.  Please read over the following fact sheets that you were given:   [facility] Preparing for Surgery and or MRSA Information   _x___ Take anti-hypertensive listed below, cardiac, seizure, asthma, anti-reflux and psychiatric medicines. These include:  1. Budesonide/Entocort  2. Esomeprazole/Nexium  _x___ Use CHG Soap or sage wipes as directed on instruction sheet   _x___ Follow recommendations from Cardiologist, Pulmonologist or PCP regarding stopping Aspirin, Coumadin, Plavix ,Eliquis, Effient, or Pradaxa, and Pletal. Guideline is to stop Aspirin 7 days prior to surgery, Friday December 13, 2017 unless otherwise instructed by Cardiologist.  _x___Stop Anti-inflammatories such as Advil, Aleve, Ibuprofen, Motrin, Naproxen, Naprosyn, Goodies powders or aspirin products. OK to take Tylenol and Celebrex.   _x___ Stop supplements (Coenzyme Q10/CoQ10) until after surgery. But may continue Vitamin D, Vitamin B, and multivitamin.

## 2017-12-10 NOTE — Pre-Procedure Instructions (Signed)
Faxed abnormal lab results to Dr. Clydell Hakim office.

## 2017-12-11 LAB — URINE CULTURE
CULTURE: NO GROWTH
Special Requests: NORMAL

## 2017-12-11 LAB — SURGICAL PCR SCREEN
MRSA, PCR: NEGATIVE
Staphylococcus aureus: POSITIVE — AB

## 2017-12-11 NOTE — Pre-Procedure Instructions (Signed)
POSITIVE STAPH FAXED TO DR Marry Guan

## 2017-12-16 DIAGNOSIS — I10 Essential (primary) hypertension: Secondary | ICD-10-CM | POA: Insufficient documentation

## 2017-12-22 MED ORDER — SODIUM CHLORIDE 0.9 % IV SOLN
1000.0000 mg | INTRAVENOUS | Status: AC
  Administered 2017-12-23: 1000 mg via INTRAVENOUS
  Filled 2017-12-22: qty 10

## 2017-12-22 MED ORDER — CEFAZOLIN SODIUM-DEXTROSE 2-4 GM/100ML-% IV SOLN
2.0000 g | INTRAVENOUS | Status: AC
  Administered 2017-12-23: 2 g via INTRAVENOUS

## 2017-12-23 ENCOUNTER — Inpatient Hospital Stay: Payer: Medicare Other | Admitting: Anesthesiology

## 2017-12-23 ENCOUNTER — Inpatient Hospital Stay
Admission: RE | Admit: 2017-12-23 | Discharge: 2017-12-25 | DRG: 470 | Disposition: A | Discharge status: Skilled Nursing Facility | Payer: Medicare Other | Source: Ambulatory Visit | Attending: Orthopedic Surgery | Admitting: Orthopedic Surgery

## 2017-12-23 ENCOUNTER — Encounter: Payer: Self-pay | Admitting: Orthopedic Surgery

## 2017-12-23 ENCOUNTER — Other Ambulatory Visit: Payer: Self-pay

## 2017-12-23 ENCOUNTER — Encounter
Admission: RE | Disposition: A | Discharge status: Skilled Nursing Facility | Payer: Self-pay | Source: Ambulatory Visit | Attending: Orthopedic Surgery

## 2017-12-23 ENCOUNTER — Inpatient Hospital Stay: Payer: Medicare Other

## 2017-12-23 DIAGNOSIS — Z87891 Personal history of nicotine dependence: Secondary | ICD-10-CM

## 2017-12-23 DIAGNOSIS — K219 Gastro-esophageal reflux disease without esophagitis: Secondary | ICD-10-CM | POA: Diagnosis present

## 2017-12-23 DIAGNOSIS — M810 Age-related osteoporosis without current pathological fracture: Secondary | ICD-10-CM | POA: Diagnosis present

## 2017-12-23 DIAGNOSIS — M1612 Unilateral primary osteoarthritis, left hip: Principal | ICD-10-CM | POA: Diagnosis present

## 2017-12-23 DIAGNOSIS — Z96649 Presence of unspecified artificial hip joint: Secondary | ICD-10-CM

## 2017-12-23 DIAGNOSIS — I10 Essential (primary) hypertension: Secondary | ICD-10-CM | POA: Diagnosis present

## 2017-12-23 DIAGNOSIS — Z7982 Long term (current) use of aspirin: Secondary | ICD-10-CM

## 2017-12-23 DIAGNOSIS — E785 Hyperlipidemia, unspecified: Secondary | ICD-10-CM | POA: Diagnosis present

## 2017-12-23 DIAGNOSIS — K5090 Crohn's disease, unspecified, without complications: Secondary | ICD-10-CM | POA: Insufficient documentation

## 2017-12-23 DIAGNOSIS — M879 Osteonecrosis, unspecified: Secondary | ICD-10-CM | POA: Diagnosis present

## 2017-12-23 DIAGNOSIS — H1851 Endothelial corneal dystrophy: Secondary | ICD-10-CM

## 2017-12-23 DIAGNOSIS — D649 Anemia, unspecified: Secondary | ICD-10-CM | POA: Insufficient documentation

## 2017-12-23 DIAGNOSIS — H18519 Endothelial corneal dystrophy, unspecified eye: Secondary | ICD-10-CM | POA: Insufficient documentation

## 2017-12-23 HISTORY — PX: TOTAL HIP ARTHROPLASTY: SHX124

## 2017-12-23 LAB — ABO/RH: ABO/RH(D): O POS

## 2017-12-23 LAB — POCT I-STAT 4, (NA,K, GLUC, HGB,HCT)
GLUCOSE: 99 mg/dL (ref 65–99)
HCT: 30 % — ABNORMAL LOW (ref 36.0–46.0)
HEMOGLOBIN: 10.2 g/dL — AB (ref 12.0–15.0)
POTASSIUM: 3.9 mmol/L (ref 3.5–5.1)
SODIUM: 139 mmol/L (ref 135–145)

## 2017-12-23 SURGERY — ARTHROPLASTY, HIP, TOTAL,POSTERIOR APPROACH
Anesthesia: Spinal | Laterality: Left

## 2017-12-23 MED ORDER — METOCLOPRAMIDE HCL 10 MG PO TABS
10.0000 mg | ORAL_TABLET | Freq: Three times a day (TID) | ORAL | Status: AC
  Administered 2017-12-23 – 2017-12-25 (×8): 10 mg via ORAL
  Filled 2017-12-23 (×8): qty 1

## 2017-12-23 MED ORDER — CELECOXIB 200 MG PO CAPS
200.0000 mg | ORAL_CAPSULE | Freq: Two times a day (BID) | ORAL | Status: DC
  Administered 2017-12-23 – 2017-12-25 (×5): 200 mg via ORAL
  Filled 2017-12-23 (×5): qty 1

## 2017-12-23 MED ORDER — ONDANSETRON HCL 4 MG/2ML IJ SOLN: 4.0000 mg | Freq: Once | INTRAMUSCULAR | Status: DC | PRN

## 2017-12-23 MED ORDER — GABAPENTIN 300 MG PO CAPS
300.0000 mg | ORAL_CAPSULE | Freq: Once | ORAL | Status: AC
  Administered 2017-12-23: 300 mg via ORAL

## 2017-12-23 MED ORDER — PROPOFOL 500 MG/50ML IV EMUL
INTRAVENOUS | Status: AC
  Filled 2017-12-23: qty 50

## 2017-12-23 MED ORDER — FENTANYL CITRATE (PF) 100 MCG/2ML IJ SOLN
INTRAMUSCULAR | Status: DC | PRN
  Administered 2017-12-23: 100 ug via INTRAVENOUS

## 2017-12-23 MED ORDER — ACETAMINOPHEN 10 MG/ML IV SOLN
INTRAVENOUS | Status: AC
  Filled 2017-12-23: qty 100

## 2017-12-23 MED ORDER — CALCIUM CARBONATE-VITAMIN D 500-200 MG-UNIT PO TABS
1.0000 | ORAL_TABLET | Freq: Three times a day (TID) | ORAL | Status: DC
  Administered 2017-12-23 – 2017-12-25 (×6): 1 via ORAL
  Filled 2017-12-23 (×6): qty 1

## 2017-12-23 MED ORDER — LACTATED RINGERS IV SOLN
INTRAVENOUS | Status: DC
  Administered 2017-12-23 (×2): via INTRAVENOUS

## 2017-12-23 MED ORDER — PHENOL 1.4 % MT LIQD: 1.0000 | OROMUCOSAL | Status: DC | PRN

## 2017-12-23 MED ORDER — OXYCODONE HCL 5 MG PO TABS: 5.0000 mg | ORAL_TABLET | ORAL | Status: DC | PRN

## 2017-12-23 MED ORDER — NEOMYCIN-POLYMYXIN B GU 40-200000 IR SOLN
Status: DC | PRN
  Administered 2017-12-23: 14 mL

## 2017-12-23 MED ORDER — BUDESONIDE 3 MG PO CPEP
9.0000 mg | ORAL_CAPSULE | Freq: Every day | ORAL | Status: DC
  Administered 2017-12-24 – 2017-12-25 (×2): 9 mg via ORAL
  Filled 2017-12-23 (×2): qty 3

## 2017-12-23 MED ORDER — SENNOSIDES-DOCUSATE SODIUM 8.6-50 MG PO TABS
1.0000 | ORAL_TABLET | Freq: Two times a day (BID) | ORAL | Status: DC
  Administered 2017-12-23 – 2017-12-25 (×3): 1 via ORAL
  Filled 2017-12-23 (×5): qty 1

## 2017-12-23 MED ORDER — PREDNISOLONE ACETATE 1 % OP SUSP
1.0000 [drp] | Freq: Every day | OPHTHALMIC | Status: DC
  Administered 2017-12-23 – 2017-12-24 (×2): 1 [drp] via OPHTHALMIC
  Filled 2017-12-23: qty 1

## 2017-12-23 MED ORDER — SODIUM CHLORIDE 0.9 % IV SOLN
INTRAVENOUS | Status: DC
Start: 2017-12-23 — End: 2017-12-25
  Administered 2017-12-23: 12:00:00 via INTRAVENOUS
  Administered 2017-12-24: 100 mL/h via INTRAVENOUS

## 2017-12-23 MED ORDER — OXYCODONE HCL 5 MG PO TABS: 10.0000 mg | ORAL_TABLET | ORAL | Status: DC | PRN

## 2017-12-23 MED ORDER — METOCLOPRAMIDE HCL 10 MG PO TABS: 5.0000 mg | ORAL_TABLET | Freq: Three times a day (TID) | ORAL | Status: DC | PRN

## 2017-12-23 MED ORDER — BUPIVACAINE HCL (PF) 0.5 % IJ SOLN
INTRAMUSCULAR | Status: AC
  Filled 2017-12-23: qty 10

## 2017-12-23 MED ORDER — FENTANYL CITRATE (PF) 100 MCG/2ML IJ SOLN
INTRAMUSCULAR | Status: AC
  Filled 2017-12-23: qty 2

## 2017-12-23 MED ORDER — LIDOCAINE HCL (PF) 2 % IJ SOLN
INTRAMUSCULAR | Status: AC
  Filled 2017-12-23: qty 10

## 2017-12-23 MED ORDER — TAPENTADOL HCL 50 MG PO TABS
50.0000 mg | ORAL_TABLET | Freq: Once | ORAL | Status: AC
  Administered 2017-12-23: 50 mg via ORAL
  Filled 2017-12-23: qty 1

## 2017-12-23 MED ORDER — HYDROMORPHONE HCL 1 MG/ML IJ SOLN: 0.5000 mg | INTRAMUSCULAR | Status: DC | PRN

## 2017-12-23 MED ORDER — MAGNESIUM OXIDE 400 (241.3 MG) MG PO TABS
400.0000 mg | ORAL_TABLET | Freq: Every day | ORAL | Status: DC
  Administered 2017-12-23 – 2017-12-25 (×3): 400 mg via ORAL
  Filled 2017-12-23 (×3): qty 1

## 2017-12-23 MED ORDER — ACETAMINOPHEN 325 MG PO TABS
325.0000 mg | ORAL_TABLET | Freq: Four times a day (QID) | ORAL | Status: DC | PRN
Start: 2017-12-24 — End: 2017-12-25

## 2017-12-23 MED ORDER — ONDANSETRON HCL 4 MG PO TABS: 4.0000 mg | ORAL_TABLET | Freq: Four times a day (QID) | ORAL | Status: DC | PRN

## 2017-12-23 MED ORDER — TORSEMIDE 5 MG PO TABS
5.0000 mg | ORAL_TABLET | Freq: Every day | ORAL | Status: DC | PRN
  Filled 2017-12-23: qty 1

## 2017-12-23 MED ORDER — DIPHENHYDRAMINE HCL 12.5 MG/5ML PO ELIX: 12.5000 mg | ORAL_SOLUTION | ORAL | Status: DC | PRN

## 2017-12-23 MED ORDER — CEFAZOLIN SODIUM-DEXTROSE 2-4 GM/100ML-% IV SOLN
2.0000 g | Freq: Four times a day (QID) | INTRAVENOUS | Status: AC
  Administered 2017-12-23 – 2017-12-24 (×4): 2 g via INTRAVENOUS
  Filled 2017-12-23 (×5): qty 100

## 2017-12-23 MED ORDER — VITAMIN C 500 MG PO TABS
500.0000 mg | ORAL_TABLET | Freq: Every day | ORAL | Status: DC
  Administered 2017-12-23 – 2017-12-25 (×3): 500 mg via ORAL
  Filled 2017-12-23 (×3): qty 1

## 2017-12-23 MED ORDER — ADULT MULTIVITAMIN W/MINERALS CH
1.0000 | ORAL_TABLET | Freq: Every day | ORAL | Status: DC
  Administered 2017-12-24 – 2017-12-25 (×2): 1 via ORAL
  Filled 2017-12-23 (×3): qty 1

## 2017-12-23 MED ORDER — TRANEXAMIC ACID 1000 MG/10ML IV SOLN
1000.0000 mg | Freq: Once | INTRAVENOUS | Status: AC
  Administered 2017-12-23: 1000 mg via INTRAVENOUS
  Filled 2017-12-23 (×2): qty 10

## 2017-12-23 MED ORDER — POTASSIUM CHLORIDE CRYS ER 20 MEQ PO TBCR
20.0000 meq | EXTENDED_RELEASE_TABLET | Freq: Two times a day (BID) | ORAL | Status: DC
  Administered 2017-12-23 – 2017-12-25 (×5): 20 meq via ORAL
  Filled 2017-12-23 (×5): qty 1

## 2017-12-23 MED ORDER — TRAMADOL HCL 50 MG PO TABS
50.0000 mg | ORAL_TABLET | ORAL | Status: DC | PRN
  Administered 2017-12-23 – 2017-12-25 (×7): 100 mg via ORAL
  Filled 2017-12-23 (×7): qty 2

## 2017-12-23 MED ORDER — VITAMIN B-12 1000 MCG PO TABS
1000.0000 ug | ORAL_TABLET | Freq: Every day | ORAL | Status: DC
Start: 2017-12-23 — End: 2017-12-25
  Administered 2017-12-24 – 2017-12-25 (×2): 1000 ug via ORAL
  Filled 2017-12-23 (×2): qty 1

## 2017-12-23 MED ORDER — PHENYLEPHRINE HCL 10 MG/ML IJ SOLN
INTRAMUSCULAR | Status: AC
  Filled 2017-12-23: qty 1

## 2017-12-23 MED ORDER — GABAPENTIN 300 MG PO CAPS
300.0000 mg | ORAL_CAPSULE | Freq: Every day | ORAL | Status: DC
  Administered 2017-12-23 – 2017-12-24 (×2): 300 mg via ORAL
  Filled 2017-12-23 (×2): qty 1

## 2017-12-23 MED ORDER — ALUM & MAG HYDROXIDE-SIMETH 200-200-20 MG/5ML PO SUSP: 30.0000 mL | ORAL | Status: DC | PRN

## 2017-12-23 MED ORDER — FENTANYL CITRATE (PF) 100 MCG/2ML IJ SOLN: 25.0000 ug | INTRAMUSCULAR | Status: DC | PRN

## 2017-12-23 MED ORDER — VITAMIN D 1000 UNITS PO TABS
1000.0000 [IU] | ORAL_TABLET | Freq: Every day | ORAL | Status: DC
  Administered 2017-12-24 – 2017-12-25 (×2): 1000 [IU] via ORAL
  Filled 2017-12-23 (×4): qty 1

## 2017-12-23 MED ORDER — ACETAMINOPHEN 10 MG/ML IV SOLN
INTRAVENOUS | Status: DC | PRN
  Administered 2017-12-23: 1000 mg via INTRAVENOUS

## 2017-12-23 MED ORDER — GABAPENTIN 300 MG PO CAPS
ORAL_CAPSULE | ORAL | Status: AC
  Administered 2017-12-23: 300 mg via ORAL
  Filled 2017-12-23: qty 1

## 2017-12-23 MED ORDER — LOPERAMIDE HCL 2 MG PO CAPS
2.0000 mg | ORAL_CAPSULE | Freq: Four times a day (QID) | ORAL | Status: DC | PRN
  Filled 2017-12-23: qty 1

## 2017-12-23 MED ORDER — DEXAMETHASONE SODIUM PHOSPHATE 10 MG/ML IJ SOLN
8.0000 mg | Freq: Once | INTRAMUSCULAR | Status: AC
  Administered 2017-12-23: 8 mg via INTRAVENOUS

## 2017-12-23 MED ORDER — DEXAMETHASONE SODIUM PHOSPHATE 10 MG/ML IJ SOLN
INTRAMUSCULAR | Status: AC
  Administered 2017-12-23: 8 mg via INTRAVENOUS
  Filled 2017-12-23: qty 1

## 2017-12-23 MED ORDER — CELECOXIB 200 MG PO CAPS
ORAL_CAPSULE | ORAL | Status: AC
  Administered 2017-12-23: 400 mg via ORAL
  Filled 2017-12-23: qty 2

## 2017-12-23 MED ORDER — ENOXAPARIN SODIUM 30 MG/0.3ML ~~LOC~~ SOLN
30.0000 mg | Freq: Two times a day (BID) | SUBCUTANEOUS | Status: DC
  Administered 2017-12-24 – 2017-12-25 (×3): 30 mg via SUBCUTANEOUS
  Filled 2017-12-23 (×3): qty 0.3

## 2017-12-23 MED ORDER — CEFAZOLIN SODIUM-DEXTROSE 2-4 GM/100ML-% IV SOLN
INTRAVENOUS | Status: AC
  Filled 2017-12-23: qty 100

## 2017-12-23 MED ORDER — BUPIVACAINE HCL (PF) 0.5 % IJ SOLN
INTRAMUSCULAR | Status: DC | PRN
  Administered 2017-12-23: 3 mL

## 2017-12-23 MED ORDER — PHENYLEPHRINE HCL 10 MG/ML IJ SOLN
INTRAMUSCULAR | Status: DC | PRN
  Administered 2017-12-23: 20 ug/min via INTRAVENOUS

## 2017-12-23 MED ORDER — PROPOFOL 500 MG/50ML IV EMUL
INTRAVENOUS | Status: DC | PRN
  Administered 2017-12-23: 60 ug/kg/min via INTRAVENOUS

## 2017-12-23 MED ORDER — MENTHOL 3 MG MT LOZG: 1.0000 | LOZENGE | OROMUCOSAL | Status: DC | PRN

## 2017-12-23 MED ORDER — ACETAMINOPHEN 10 MG/ML IV SOLN
1000.0000 mg | Freq: Four times a day (QID) | INTRAVENOUS | Status: AC
  Administered 2017-12-23 – 2017-12-24 (×4): 1000 mg via INTRAVENOUS
  Filled 2017-12-23 (×4): qty 100

## 2017-12-23 MED ORDER — BISACODYL 10 MG RE SUPP: 10.0000 mg | Freq: Every day | RECTAL | Status: DC | PRN

## 2017-12-23 MED ORDER — MAGNESIUM HYDROXIDE 400 MG/5ML PO SUSP
30.0000 mL | Freq: Every day | ORAL | Status: DC | PRN
  Filled 2017-12-23: qty 30

## 2017-12-23 MED ORDER — POLYSACCHARIDE IRON COMPLEX 150 MG PO CAPS
150.0000 mg | ORAL_CAPSULE | Freq: Every day | ORAL | Status: DC
  Administered 2017-12-23 – 2017-12-25 (×3): 150 mg via ORAL
  Filled 2017-12-23 (×3): qty 1

## 2017-12-23 MED ORDER — CHLORHEXIDINE GLUCONATE 4 % EX LIQD: 60.0000 mL | Freq: Once | CUTANEOUS | Status: DC

## 2017-12-23 MED ORDER — PANTOPRAZOLE SODIUM 40 MG PO TBEC
40.0000 mg | DELAYED_RELEASE_TABLET | Freq: Two times a day (BID) | ORAL | Status: DC
  Administered 2017-12-23 – 2017-12-25 (×4): 40 mg via ORAL
  Filled 2017-12-23 (×5): qty 1

## 2017-12-23 MED ORDER — CELECOXIB 200 MG PO CAPS
400.0000 mg | ORAL_CAPSULE | Freq: Once | ORAL | Status: AC
  Administered 2017-12-23: 400 mg via ORAL

## 2017-12-23 MED ORDER — ONDANSETRON HCL 4 MG/2ML IJ SOLN: 4.0000 mg | Freq: Four times a day (QID) | INTRAMUSCULAR | Status: DC | PRN

## 2017-12-23 MED ORDER — METOCLOPRAMIDE HCL 5 MG/ML IJ SOLN: 5.0000 mg | Freq: Three times a day (TID) | INTRAMUSCULAR | Status: DC | PRN

## 2017-12-23 MED ORDER — FLEET ENEMA 7-19 GM/118ML RE ENEM: 1.0000 | ENEMA | Freq: Once | RECTAL | Status: DC | PRN

## 2017-12-23 MED ORDER — PROPOFOL 10 MG/ML IV BOLUS
INTRAVENOUS | Status: DC | PRN
  Administered 2017-12-23 (×4): 14 mg via INTRAVENOUS
  Administered 2017-12-23: 28 mg via INTRAVENOUS

## 2017-12-23 MED ORDER — GABAPENTIN 100 MG PO CAPS
100.0000 mg | ORAL_CAPSULE | Freq: Two times a day (BID) | ORAL | Status: DC
  Administered 2017-12-23 – 2017-12-25 (×5): 100 mg via ORAL
  Filled 2017-12-23 (×5): qty 1

## 2017-12-23 MED ORDER — FERROUS SULFATE 325 (65 FE) MG PO TABS
325.0000 mg | ORAL_TABLET | Freq: Two times a day (BID) | ORAL | Status: DC
  Administered 2017-12-24 – 2017-12-25 (×3): 325 mg via ORAL
  Filled 2017-12-23 (×4): qty 1

## 2017-12-23 MED ORDER — RISAQUAD PO CAPS
1.0000 | ORAL_CAPSULE | Freq: Every day | ORAL | Status: DC
  Administered 2017-12-23 – 2017-12-25 (×3): 1 via ORAL
  Filled 2017-12-23 (×3): qty 1

## 2017-12-23 MED ORDER — GLYCOPYRROLATE 0.2 MG/ML IJ SOLN
INTRAMUSCULAR | Status: AC
  Filled 2017-12-23: qty 1

## 2017-12-23 SURGICAL SUPPLY — 53 items
BLADE DRUM FLTD (BLADE) ×3 IMPLANT
BLADE SAW 1 (BLADE) ×3 IMPLANT
CANISTER SUCT 1200ML W/VALVE (MISCELLANEOUS) ×3 IMPLANT
CANISTER SUCT 3000ML PPV (MISCELLANEOUS) ×6 IMPLANT
CAPT HIP TOTAL 2 ×3 IMPLANT
CARTRIDGE OIL MAESTRO DRILL (MISCELLANEOUS) ×1 IMPLANT
DIFFUSER DRILL AIR PNEUMATIC (MISCELLANEOUS) ×3 IMPLANT
DRAPE INCISE IOBAN 66X60 STRL (DRAPES) ×3 IMPLANT
DRAPE SHEET LG 3/4 BI-LAMINATE (DRAPES) ×3 IMPLANT
DRSG DERMACEA 8X12 NADH (GAUZE/BANDAGES/DRESSINGS) ×3 IMPLANT
DRSG OPSITE POSTOP 4X12 (GAUZE/BANDAGES/DRESSINGS) ×3 IMPLANT
DRSG OPSITE POSTOP 4X14 (GAUZE/BANDAGES/DRESSINGS) IMPLANT
DRSG TEGADERM 4X4.75 (GAUZE/BANDAGES/DRESSINGS) ×3 IMPLANT
DURAPREP 26ML APPLICATOR (WOUND CARE) ×3 IMPLANT
ELECT BLADE 6.5 EXT (BLADE) ×3 IMPLANT
ELECT CAUTERY BLADE 6.4 (BLADE) ×3 IMPLANT
GLOVE BIOGEL M STRL SZ7.5 (GLOVE) ×6 IMPLANT
GLOVE BIOGEL PI IND STRL 9 (GLOVE) ×1 IMPLANT
GLOVE BIOGEL PI INDICATOR 9 (GLOVE) ×2
GLOVE INDICATOR 8.0 STRL GRN (GLOVE) ×3 IMPLANT
GLOVE SURG SYN 9.0  PF PI (GLOVE) ×2
GLOVE SURG SYN 9.0 PF PI (GLOVE) ×1 IMPLANT
GOWN STRL REUS W/ TWL LRG LVL3 (GOWN DISPOSABLE) ×2 IMPLANT
GOWN STRL REUS W/TWL 2XL LVL3 (GOWN DISPOSABLE) ×3 IMPLANT
GOWN STRL REUS W/TWL LRG LVL3 (GOWN DISPOSABLE) ×4
HEMOVAC 400CC 10FR (MISCELLANEOUS) ×3 IMPLANT
HOLDER FOLEY CATH W/STRAP (MISCELLANEOUS) ×3 IMPLANT
HOOD PEEL AWAY FLYTE STAYCOOL (MISCELLANEOUS) ×6 IMPLANT
KIT TURNOVER KIT A (KITS) ×3 IMPLANT
NDL SAFETY ECLIPSE 18X1.5 (NEEDLE) ×1 IMPLANT
NEEDLE HYPO 18GX1.5 SHARP (NEEDLE) ×2
NS IRRIG 500ML POUR BTL (IV SOLUTION) ×3 IMPLANT
OIL CARTRIDGE MAESTRO DRILL (MISCELLANEOUS) ×3
PACK HIP PROSTHESIS (MISCELLANEOUS) ×3 IMPLANT
PIN STEIN THRED 5/32 (Pin) ×3 IMPLANT
PULSAVAC PLUS IRRIG FAN TIP (DISPOSABLE) ×3
SOL .9 NS 3000ML IRR  AL (IV SOLUTION) ×2
SOL .9 NS 3000ML IRR UROMATIC (IV SOLUTION) ×1 IMPLANT
SOL PREP PVP 2OZ (MISCELLANEOUS) ×3
SOLUTION PREP PVP 2OZ (MISCELLANEOUS) ×1 IMPLANT
SPONGE DRAIN TRACH 4X4 STRL 2S (GAUZE/BANDAGES/DRESSINGS) ×3 IMPLANT
STAPLER SKIN PROX 35W (STAPLE) ×3 IMPLANT
SUT ETHIBOND #5 BRAIDED 30INL (SUTURE) ×3 IMPLANT
SUT VIC AB 0 CT1 36 (SUTURE) ×3 IMPLANT
SUT VIC AB 1 CT1 36 (SUTURE) ×6 IMPLANT
SUT VIC AB 2-0 CT1 27 (SUTURE) ×2
SUT VIC AB 2-0 CT1 TAPERPNT 27 (SUTURE) ×1 IMPLANT
SYR 20CC LL (SYRINGE) ×3 IMPLANT
TAPE ADH 3 LX (MISCELLANEOUS) ×3 IMPLANT
TAPE TRANSPORE STRL 2 31045 (GAUZE/BANDAGES/DRESSINGS) ×3 IMPLANT
TIP FAN IRRIG PULSAVAC PLUS (DISPOSABLE) ×1 IMPLANT
TOWEL OR 17X26 4PK STRL BLUE (TOWEL DISPOSABLE) ×3 IMPLANT
TRAY FOLEY W/METER SILVER 16FR (SET/KITS/TRAYS/PACK) ×3 IMPLANT

## 2017-12-23 NOTE — Anesthesia Preprocedure Evaluation (Signed)
Anesthesia Evaluation  Patient identified by MRN, date of birth, ID band Patient awake    Reviewed: Allergy & Precautions, NPO status , Patient's Chart, lab work & pertinent test results  Airway Mallampati: II  TM Distance: >3 FB     Dental  (+) Teeth Intact   Pulmonary pneumonia, resolved, former smoker,    Pulmonary exam normal        Cardiovascular hypertension, Normal cardiovascular exam     Neuro/Psych negative neurological ROS  negative psych ROS   GI/Hepatic Neg liver ROS, GERD  ,  Endo/Other  negative endocrine ROS  Renal/GU negative Renal ROS  negative genitourinary   Musculoskeletal  (+) Arthritis , Osteoarthritis,    Abdominal Normal abdominal exam  (+)   Peds negative pediatric ROS (+)  Hematology  (+) anemia ,   Anesthesia Other Findings   Reproductive/Obstetrics                             Anesthesia Physical Anesthesia Plan  ASA: III  Anesthesia Plan: Spinal   Post-op Pain Management:    Induction: Intravenous  PONV Risk Score and Plan:   Airway Management Planned: Nasal Cannula  Additional Equipment:   Intra-op Plan:   Post-operative Plan:   Informed Consent:   Dental advisory given  Plan Discussed with: CRNA and Surgeon  Anesthesia Plan Comments:         Anesthesia Quick Evaluation

## 2017-12-23 NOTE — Transfer of Care (Signed)
Immediate Anesthesia Transfer of Care Note  Patient: Joanne [name]  Procedure(s) Performed: TOTAL HIP ARTHROPLASTY (Left )  Patient Location: PACU  Anesthesia Type:Spinal  Level of Consciousness: awake and patient cooperative  Airway & Oxygen Therapy: Patient Spontanous Breathing and Patient connected to nasal cannula oxygen  Post-op Assessment: Report given to RN and Post -op Vital signs reviewed and stable  Post vital signs: Reviewed and stable  Last Vitals:  Vitals Value Taken Time  BP    Temp    Pulse    Resp    SpO2      Last Pain:  Vitals:   12/23/17 0630  TempSrc: Oral  PainSc: 0-No pain         Complications: No apparent anesthesia complications

## 2017-12-23 NOTE — NC FL2 (Signed)
[place] LEVEL OF CARE SCREENING TOOL     IDENTIFICATION  Patient Name: Joanne [name] Birthdate: February 27, 1931 Sex: female Admission Date (Current Location): 12/23/2017  [place] and [place] Number:  Engineering geologist and Address:  [facility], [address] [place], [place] [address]      Provider Number: [id number]  Attending Physician Name and Address:  Dereck Leep, MD  Relative Name and Phone Number:       Current Level of Care: Hospital Recommended Level of Care: [place] Prior Approval Number:    Date Approved/Denied:   PASRR Number: ([id number] )  Discharge Plan: SNF    Current Diagnoses: Patient Active Problem List   Diagnosis Date Noted  . Anemia, unspecified 12/23/2017  . Crohn's disease ([place]) 12/23/2017  . Fuchs' corneal dystrophy 12/23/2017  . Hyperlipidemia, unspecified 12/23/2017  . Status post total replacement of hip 12/23/2017  . Benign essential HTN 12/16/2017  . Dyspnea on exertion 12/02/2017  . Protein-calorie malnutrition ([place]) 04/21/2017  . Age-related osteoporosis with current pathological fracture with routine healing 04/03/2017  . Other dysphagia 01/24/2017  . Hypokalemia 01/22/2017  . Zenker diverticulum 09/25/2016  . History of hypertension 05/03/2015  . Hyponatremia 02/21/2015  . Infected sebaceous cyst 11/10/2013  . Nuclear sclerotic cataract of right eye 09/08/2012  . Right shoulder injury 05/27/2002    Orientation RESPIRATION BLADDER Height & Weight     Self, Time, Situation, Place  Normal Continent Weight: 161 lb (73 kg) Height:  5' (152.4 cm)  BEHAVIORAL SYMPTOMS/MOOD NEUROLOGICAL BOWEL NUTRITION STATUS      Continent Diet(Diet: Regular )  AMBULATORY STATUS COMMUNICATION OF NEEDS Skin   Extensive Assist Verbally Surgical wounds(Incision: Left Hip. )                       Personal Care Assistance Level of Assistance  Bathing, Feeding, Dressing Bathing  Assistance: Limited assistance Feeding assistance: Independent Dressing Assistance: Limited assistance     Functional Limitations Info  Sight, Hearing, Speech Sight Info: Adequate Hearing Info: Adequate Speech Info: Adequate    SPECIAL CARE FACTORS FREQUENCY  PT (By licensed PT), OT (By licensed OT)     PT Frequency: (5) OT Frequency: (5)            Contractures      Additional Factors Info  Code Status, Allergies Code Status Info: (Full Code. ) Allergies Info: (No Known Allergies. )           Current Medications (12/23/2017):  This is the current hospital active medication list Current Facility-Administered Medications  Medication Dose Route Frequency Provider Last Rate Last Dose  . 0.9 %  sodium chloride infusion   Intravenous Continuous Hooten, Laurice Record, MD 100 mL/hr at 12/23/17 1228    . acetaminophen (OFIRMEV) IV 1,000 mg  1,000 mg Intravenous Q6H Hooten, Laurice Record, MD      . Derrill Memo ON 12/24/2017] acetaminophen (TYLENOL) tablet 325-650 mg  325-650 mg Oral Q6H PRN Hooten, Laurice Record, MD      . acidophilus (RISAQUAD) capsule 1 capsule  1 capsule Oral Daily Hooten, Laurice Record, MD   1 capsule at 12/23/17 1439  . alum & mag hydroxide-simeth (MAALOX/MYLANTA) 200-200-20 MG/5ML suspension 30 mL  30 mL Oral Q4H PRN Hooten, Laurice Record, MD      . bisacodyl (DULCOLAX) suppository 10 mg  10 mg Rectal Daily PRN Dereck Leep, MD      . Derrill Memo ON 12/24/2017]  budesonide (ENTOCORT EC) 24 hr capsule 9 mg  9 mg Oral Daily Hooten, Laurice Record, MD      . calcium-vitamin D (OSCAL WITH D) 500-200 MG-UNIT per tablet 1 tablet  1 tablet Oral TID Hooten, Laurice Record, MD      . ceFAZolin (ANCEF) IVPB 2g/100 mL premix  2 g Intravenous Q6H Hooten, Laurice Record, MD   Stopped at 12/23/17 1511  . celecoxib (CELEBREX) capsule 200 mg  200 mg Oral BID Dereck Leep, MD   200 mg at 12/23/17 1439  . cholecalciferol (VITAMIN D) tablet 1,000 Units  1,000 Units Oral Daily Hooten, Laurice Record, MD      . diphenhydrAMINE (BENADRYL)  12.5 MG/5ML elixir 12.5-25 mg  12.5-25 mg Oral Q4H PRN Hooten, Laurice Record, MD      . Derrill Memo ON 12/24/2017] enoxaparin (LOVENOX) injection 30 mg  30 mg Subcutaneous Q12H Hooten, Laurice Record, MD      . ferrous sulfate tablet 325 mg  325 mg Oral BID WC Hooten, Laurice Record, MD      . gabapentin (NEURONTIN) capsule 100 mg  100 mg Oral BID Dereck Leep, MD   100 mg at 12/23/17 1439  . gabapentin (NEURONTIN) capsule 300 mg  300 mg Oral QHS Hooten, Laurice Record, MD      . HYDROmorphone (DILAUDID) injection 0.5-1 mg  0.5-1 mg Intravenous Q4H PRN Hooten, Laurice Record, MD      . iron polysaccharides (NIFEREX) capsule 150 mg  150 mg Oral Daily Hooten, Laurice Record, MD   150 mg at 12/23/17 1428  . loperamide (IMODIUM) capsule 2 mg  2 mg Oral QID PRN Hooten, Laurice Record, MD      . magnesium hydroxide (MILK OF MAGNESIA) suspension 30 mL  30 mL Oral Daily PRN Hooten, Laurice Record, MD      . magnesium oxide (MAG-OX) tablet 400 mg  400 mg Oral Daily Hooten, Laurice Record, MD   400 mg at 12/23/17 1428  . menthol-cetylpyridinium (CEPACOL) lozenge 3 mg  1 lozenge Oral PRN Hooten, Laurice Record, MD       Or  . phenol (CHLORASEPTIC) mouth spray 1 spray  1 spray Mouth/Throat PRN Hooten, Laurice Record, MD      . metoCLOPramide (REGLAN) tablet 5-10 mg  5-10 mg Oral Q8H PRN Hooten, Laurice Record, MD       Or  . metoCLOPramide (REGLAN) injection 5-10 mg  5-10 mg Intravenous Q8H PRN Hooten, Laurice Record, MD      . metoCLOPramide (REGLAN) tablet 10 mg  10 mg Oral TID AC & HS Hooten, Laurice Record, MD      . multivitamin with minerals tablet 1 tablet  1 tablet Oral Daily Hooten, Laurice Record, MD      . ondansetron (ZOFRAN) tablet 4 mg  4 mg Oral Q6H PRN Hooten, Laurice Record, MD       Or  . ondansetron (ZOFRAN) injection 4 mg  4 mg Intravenous Q6H PRN Hooten, Laurice Record, MD      . oxyCODONE (Oxy IR/ROXICODONE) immediate release tablet 10 mg  10 mg Oral Q4H PRN Hooten, Laurice Record, MD      . oxyCODONE (Oxy IR/ROXICODONE) immediate release tablet 5 mg  5 mg Oral Q4H PRN Hooten, Laurice Record, MD      . pantoprazole  (PROTONIX) EC tablet 40 mg  40 mg Oral BID Hooten, Laurice Record, MD      . potassium chloride SA (K-DUR,KLOR-CON) CR tablet 20 mEq  20 mEq  Oral BID Dereck Leep, MD   20 mEq at 12/23/17 1427  . prednisoLONE acetate (PRED FORTE) 1 % ophthalmic suspension 1 drop  1 drop Both Eyes QHS Hooten, Laurice Record, MD      . senna-docusate (Senokot-S) tablet 1 tablet  1 tablet Oral BID Hooten, Laurice Record, MD      . sodium phosphate (FLEET) 7-19 GM/118ML enema 1 enema  1 enema Rectal Once PRN Hooten, Laurice Record, MD      . torsemide (DEMADEX) tablet 5 mg  5 mg Oral Daily PRN Hooten, Laurice Record, MD      . traMADol Veatrice Bourbon) tablet 50-100 mg  50-100 mg Oral Q4H PRN Dereck Leep, MD   100 mg at 12/23/17 1228  . vitamin B-12 (CYANOCOBALAMIN) tablet 1,000 mcg  1,000 mcg Oral Daily Hooten, Laurice Record, MD      . vitamin C (ASCORBIC ACID) tablet 500 mg  500 mg Oral Daily Hooten, Laurice Record, MD   500 mg at 12/23/17 1405     Discharge Medications: Please see discharge summary for a list of discharge medications.  Relevant Imaging Results:  Relevant Lab Results:   Additional Information (SSN: [id number])  Renelda Kilian, Veronia Beets, LCSW

## 2017-12-23 NOTE — Anesthesia Procedure Notes (Signed)
Spinal  Patient location during procedure: OR Start time: 12/23/2017 7:21 AM End time: 12/23/2017 7:25 AM Staffing Performed: resident/CRNA  Preanesthetic Checklist Completed: patient identified, site marked, surgical consent, pre-op evaluation, timeout performed, IV checked, risks and benefits discussed and monitors and equipment checked Spinal Block Patient position: sitting Prep: ChloraPrep Patient monitoring: heart rate, continuous pulse ox, blood pressure and cardiac monitor Approach: midline Location: L4-5 Injection technique: single-shot Needle Needle type: Introducer and Pencan  Needle gauge: 24 G Needle length: 9 cm Additional Notes Negative paresthesia. Negative blood return. Positive free-flowing CSF. Expiration date of kit checked and confirmed. Patient tolerated procedure well, without complications.

## 2017-12-23 NOTE — Progress Notes (Signed)
Pt admitted to room 149 from PACU. Pt is A&Ox4. Honeycomb dressing CDI to left hip, hemovac and foley in place. Pillows between legs, pt educated on hip precautions. Pt and her daughters oriented to room and pain medication regimen.   [place], Jerry Caras

## 2017-12-23 NOTE — H&P (Signed)
The patient has been re-examined, and the chart reviewed, and there have been no interval changes to the documented history and physical.    The risks, benefits, and alternatives have been discussed at length. The patient expressed understanding of the risks benefits and agreed with plans for surgical intervention.  James P. Hooten, Jr. M.D.    

## 2017-12-23 NOTE — Discharge Instructions (Signed)
Instructions after Total Hip Replacement     Joanne [name] Bouche., M.D.     Dept. of [facility]  [address] [address], [place]  [address]  Phone: [phone]   Fax: [phone]    DIET:  Drink plenty of non-alcoholic fluids.  Resume your normal diet. Include foods high in fiber.  ACTIVITY:   You may use crutches or a walker with weight-bearing as tolerated, unless instructed otherwise.  You may be weaned off of the walker or crutches by your Physical Therapist.   Do NOT reach below the level of your knees or cross your legs until allowed.     Continue doing gentle exercises. Exercising will reduce the pain and swelling, increase motion, and prevent muscle weakness.    Please continue to use the TED compression stockings for 6 weeks. You may remove the stockings at night, but should reapply them in the morning.  Do not drive or operate any equipment until instructed.  WOUND CARE:   Continue to use ice packs periodically to reduce pain and swelling.  Keep the incision clean and dry.  You may bathe or shower after the staples are removed at the first office visit following surgery.  MEDICATIONS:  You may resume your regular medications.  Please take the pain medication as prescribed on the medication.  Do not take pain medication on an empty stomach.  You have been given a prescription for a blood thinner to prevent blood clots. Please take the medication as instructed. (NOTE: After completing a 2 week course of Lovenox, take one Enteric-coated aspirin once a day.)  Pain medications and iron supplements can cause constipation. Use a stool softener (Senokot or Colace) on a daily basis and a laxative (dulcolax or miralax) as needed.  Do not drive or drink alcoholic beverages when taking pain medications.  CALL THE OFFICE FOR:  Temperature above 101 degrees  Excessive bleeding or drainage on the dressing.  Excessive  swelling, coldness, or paleness of the toes.  Persistent nausea and vomiting.  FOLLOW-UP:   You should have an appointment to return to the office in 6 weeks after surgery.  Arrangements have been made for continuation of Physical Therapy (either home therapy or outpatient therapy).

## 2017-12-23 NOTE — Evaluation (Signed)
Physical Therapy Evaluation Patient Details Name: Joanne [name] MRN: [id number] DOB: 1930/11/14 Today's Date: 12/23/2017   History of Present Illness  Pt admitted for L THR. HIstory of R THR 5 years ago.   Clinical Impression  Pt is a pleasant 84 year old female who was admitted for L THR. Pt performs bed mobility with mod assist, transfers with mod assist, and ambulation with min assist and RW. Pt demonstrates deficits with strength/pain/mobility. Pt appears motivated to participate. Educated in hip precautions prior to mobility efforts. Limited by pain this date. Would benefit from skilled PT to address above deficits and promote optimal return to PLOF; recommend transition to STR upon discharge from acute hospitalization.       Follow Up Recommendations SNF    Equipment Recommendations  None recommended by PT    Recommendations for Other Services       Precautions / Restrictions Precautions Precautions: Fall;Posterior Hip Precaution Booklet Issued: No Restrictions Weight Bearing Restrictions: Yes LLE Weight Bearing: Weight bearing as tolerated      Mobility  Bed Mobility Overal bed mobility: Needs Assistance Bed Mobility: Supine to Sit     Supine to sit: Mod assist     General bed mobility comments: needs assist for all sequencing and cues to maintain hip precautions. ONce seated at EOB, pt able to demonstrate upright posture. FOllows commands well  Transfers Overall transfer level: Needs assistance Equipment used: Rolling walker (2 wheeled) Transfers: Sit to/from Stand Sit to Stand: Mod assist         General transfer comment: bed elevated for ease of transition. Upright posture noted with good WBing on L LE.  Ambulation/Gait Ambulation/Gait assistance: Min assist Ambulation Distance (Feet): 3 Feet Assistive device: Rolling walker (2 wheeled) Gait Pattern/deviations: Step-to pattern     General Gait Details: ambulated to recliner with safe technique.  Small step to gait pattern noted, reports increased pain  Stairs            Wheelchair Mobility    Modified Rankin (Stroke Patients Only)       Balance Overall balance assessment: Needs assistance Sitting-balance support: Feet supported Sitting balance-Leahy Scale: Good     Standing balance support: Bilateral upper extremity supported Standing balance-Leahy Scale: Good                               Pertinent Vitals/Pain Pain Assessment: Faces Faces Pain Scale: Hurts little more Pain Location: L HIP Pain Descriptors / Indicators: Operative site guarding Pain Intervention(s): Limited activity within patient's tolerance;Patient requesting pain meds-RN notified;Ice applied    Home Living Family/patient expects to be discharged to:: Private residence Living Arrangements: Alone Available Help at Discharge: Family Type of Home: House Home Access: Stairs to enter   Technical brewer of Steps: 1 Home Layout: One level Home Equipment: Environmental consultant - 2 wheels;Cane - single point;Shower seat;Walker - 4 wheels      Prior Function Level of Independence: Independent with assistive device(s)         Comments: uses rollater for home mobility and SPC for community distances     Hand Dominance        Extremity/Trunk Assessment   Upper Extremity Assessment Upper Extremity Assessment: Overall WFL for tasks assessed    Lower Extremity Assessment Lower Extremity Assessment: Generalized weakness(L LE grossly 3/5)       Communication   Communication: No difficulties  Cognition Arousal/Alertness: Awake/alert Behavior During Therapy: WFL for tasks  assessed/performed Overall Cognitive Status: Within Functional Limits for tasks assessed                                        General Comments      Exercises Other Exercises Other Exercises: pt performed supine ther-ex on L LE including ankle pumps, quad sets, glut sets, and hip abd/add.  All ther-ex performed x 10 reps with min assist and safe technique   Assessment/Plan    PT Assessment Patient needs continued PT services  PT Problem List Decreased strength;Decreased activity tolerance;Decreased mobility;Pain       PT Treatment Interventions DME instruction;Gait training;Therapeutic exercise;Balance training    PT Goals (Current goals can be found in the Care Plan section)  Acute Rehab PT Goals Patient Stated Goal: to get stronger and go to rehab PT Goal Formulation: With patient Time For Goal Achievement: 01/06/18 Potential to Achieve Goals: Good    Frequency BID   Barriers to discharge        Co-evaluation               AM-PAC PT "6 Clicks" Daily Activity  Outcome Measure Difficulty turning over in bed (including adjusting bedclothes, sheets and blankets)?: Unable Difficulty moving from lying on back to sitting on the side of the bed? : Unable Difficulty sitting down on and standing up from a chair with arms (e.g., wheelchair, bedside commode, etc,.)?: Unable Help needed moving to and from a bed to chair (including a wheelchair)?: A Lot Help needed walking in hospital room?: A Lot Help needed climbing 3-5 steps with a railing? : A Lot 6 Click Score: 9    End of Session Equipment Utilized During Treatment: Gait belt Activity Tolerance: Patient tolerated treatment well Patient left: in chair;with chair alarm set;with SCD's reapplied Nurse Communication: Mobility status PT Visit Diagnosis: Unsteadiness on feet (R26.81);Muscle weakness (generalized) (M62.81);Difficulty in walking, not elsewhere classified (R26.2);Pain Pain - Right/Left: Left Pain - part of body: Hip    Time: 1600-1640 PT Time Calculation (min) (ACUTE ONLY): 40 min   Charges:   PT Evaluation $PT Eval Low Complexity: 1 Low PT Treatments $Therapeutic Exercise: 23-37 mins   PT G Codes:        Greggory Stallion, PT, DPT [phone]   Medea Deines 12/23/2017, 5:15 PM

## 2017-12-23 NOTE — Anesthesia Post-op Follow-up Note (Signed)
Anesthesia QCDR form completed.        

## 2017-12-23 NOTE — Progress Notes (Signed)
Dr Kayleen Memos informed of heart rate in 50's   No new orders   Pt stable

## 2017-12-23 NOTE — Op Note (Signed)
OPERATIVE NOTE  DATE OF SURGERY:  12/23/2017  PATIENT NAME:  Joanne [name]   DOB: 07-21-1931  MRN: [id number]  PRE-OPERATIVE DIAGNOSIS: Degenerative arthrosis of the left hip, primary  POST-OPERATIVE DIAGNOSIS:  Same  PROCEDURE:  Left total hip arthroplasty  SURGEON:  Marciano Sequin. M.D.  ASSISTANT:  Vance Peper, PA (present and scrubbed throughout the case, critical for assistance with exposure, retraction, instrumentation, and closure)  ANESTHESIA: spinal  ESTIMATED BLOOD LOSS: 150 mL  FLUIDS REPLACED: 1150 mL of crystalloid  DRAINS: 2 medium drains to a Hemovac reservoir  IMPLANTS UTILIZED: DePuy 13.5 mm small stature AML femoral stem, 48 mm OD Pinnacle 100 acetabular component, +4 mm neutral Pinnacle Marathon polyethylene insert, and a 32 mm CoCr +1 mm hip ball  INDICATIONS FOR SURGERY: Joanne [name] is a 84 y.o. year old female with a long history of progressive hip and groin  pain. X-rays demonstrated severe degenerative changes. The patient had not seen any significant improvement despite conservative nonsurgical intervention. After discussion of the risks and benefits of surgical intervention, the patient expressed understanding of the risks benefits and agree with plans for total hip arthroplasty.   The risks, benefits, and alternatives were discussed at length including but not limited to the risks of infection, bleeding, nerve injury, stiffness, blood clots, the need for revision surgery, limb length inequality, dislocation, cardiopulmonary complications, among others, and they were willing to proceed.  PROCEDURE IN DETAIL: The patient was brought into the operating room and, after adequate spinal anesthesia was achieved, the patient was placed in a right lateral decubitus position. Axillary roll was placed and all bony prominences were well-padded. The patient's left hip was cleaned and prepped with alcohol and DuraPrep and draped in the usual sterile fashion. A "timeout"  was performed as per usual protocol. A lateral curvilinear incision was made gently curving towards the posterior superior iliac spine. The IT band was incised in line with the skin incision and the fibers of the gluteus maximus were split in line. The piriformis tendon was identified, skeletonized, and incised at its insertion to the proximal femur and reflected posteriorly. A T type posterior capsulotomy was performed. Prior to dislocation of the femoral head, a threaded Steinmann pin was inserted through a separate stab incision into the pelvis superior to the acetabulum and bent in the form of a stylus so as to assess limb length and hip offset throughout the procedure. The femoral head was then dislocated posteriorly. Inspection of the femoral head demonstrated severe degenerative changes with full-thickness loss of articular cartilage. The femoral neck cut was performed using an oscillating saw. The anterior capsule was elevated off of the femoral neck using a periosteal elevator. Attention was then directed to the acetabulum. The remnant of the labrum was excised using electrocautery. Inspection of the acetabulum also demonstrated significant degenerative changes. The acetabulum was reamed in sequential fashion up to a 47 mm diameter. Good punctate bleeding bone was encountered. A 48 mm Pinnacle 100 acetabular component was positioned and impacted into place. Good scratch fit was appreciated. A +4 mm neutral polyethylene trial was inserted.  Attention was then directed to the proximal femur. A hole for reaming of the proximal femoral canal was created using a high-speed burr. The femoral canal was reamed in sequential fashion up to a 13 mm diameter. This allowed for approximately 7 cm of scratch fit.  It was thus elected to ream up to a 13.5 mm diameter to allow for a line to  line fit.  Serial broaches were inserted up to a 13.5 mm small stature femoral broach. Calcar region was planed and a trial reduction  was performed using a 32 mm hip ball with a +1 mm neck length. Good equalization of limb lengths and hip offset was appreciated and excellent stability was noted both anteriorly and posteriorly. Trial components were removed. The acetabular shell was irrigated with copious amounts of normal saline with antibiotic solution and suctioned dry. A +4 mm neutral Pinnacle Marathon polyethylene insert was positioned and impacted into place. Next, a 13.5 mm small stature AML femoral stem was positioned and impacted into place. Excellent scratch fit was appreciated. A trial reduction was again performed with a 32 mm hip ball with a +1 mm neck length. Again, good equalization of limb lengths was appreciated and excellent stability appreciated both anteriorly and posteriorly. The hip was then dislocated and the trial hip ball was removed. The Morse taper was cleaned and dried. A 32 mm cobalt chrome hip ball with a +1 mm neck length was placed on the trunnion and impacted into place. The hip was then reduced and placed through range of motion. Excellent stability was appreciated both anteriorly and posteriorly.  The wound was irrigated with copious amounts of normal saline with antibiotic solution and suctioned dry. Good hemostasis was appreciated. The posterior capsulotomy was repaired using #5 Ethibond. Piriformis tendon was reapproximated to the undersurface of the gluteus medius tendon using #5 Ethibond. Two medium drains were placed in the wound bed and brought out through separate stab incisions to be attached to a Hemovac reservoir. The IT band was reapproximated using interrupted sutures of #1 Vicryl. Subcutaneous tissue was approximated using first #0 Vicryl followed by #2-0 Vicryl. The skin was closed with skin staples.  The patient tolerated the procedure well and was transported to the recovery room in stable condition.   Marciano Sequin., M.D.

## 2017-12-23 NOTE — Progress Notes (Signed)
Dr. Marry Guan notified of elevated BPs 160-170s/50-60s, pt does not take BP medication. Pt has been given tramadol twice but refuses to take oxycodone because it has "made her crazy before." Received order for 12m nucynta x1.  [facility] KJerry Caras

## 2017-12-24 ENCOUNTER — Encounter: Payer: Self-pay | Admitting: Orthopedic Surgery

## 2017-12-24 MED ORDER — OXYCODONE HCL 5 MG PO TABS: 5.0000 mg | ORAL_TABLET | ORAL | 0 refills | Status: DC | PRN

## 2017-12-24 MED ORDER — ENOXAPARIN SODIUM 30 MG/0.3ML ~~LOC~~ SOLN: 30.0000 mg | Freq: Two times a day (BID) | SUBCUTANEOUS | 0 refills | Status: DC

## 2017-12-24 MED ORDER — TRAMADOL HCL 50 MG PO TABS: 50.0000 mg | ORAL_TABLET | ORAL | 0 refills | Status: DC | PRN

## 2017-12-24 NOTE — Evaluation (Addendum)
Occupational Therapy Evaluation Patient Details Name: Joanne [name] MRN: [id number] DOB: [birth year] Today's Date: 12/24/2017    History of Present Illness Pt. is an 84 y.o. female who was admitted to [facility] for a Left THR. PMx includes: Right THR s/p 5 years ago.   Clinical Impression   Pt. Presents with weakness, limited activity tolerance, posterior hip precautions, pain and limited functional mobility which limits the ability to complete basic ADL and IADL functioning. Pt. Resides at home alone. Pt. Was independent with ADLs, and IADL functioning: including meal preparation, and medication management. Pt. Has supportive family who assist as needed. Pt. Education was provided about A/E use, and posterior hip precautions. Pt. Could benefit from OT services for ADL training, A/E training, and pt. Education about home modification, and DME. Pt. Could beneift from SNF level of care, with follow-up OT services upon discharge.     Follow Up Recommendations  SNF    Equipment Recommendations       Recommendations for Other Services       Precautions / Restrictions Precautions Precautions: Fall;Posterior Hip Precaution Booklet Issued: Yes (comment) Restrictions Weight Bearing Restrictions: Yes LLE Weight Bearing: Weight bearing as tolerated      Mobility Bed Mobility Overal bed mobility: Needs Assistance Bed Mobility: Supine to Sit     Supine to sit: Min assist     General bed mobility comments: not performed as pt received seated in recliner  Transfers Overall transfer level: Needs assistance Equipment used: Rolling walker (2 wheeled) Transfers: Sit to/from Stand Sit to Stand: Mod assist         General transfer comment: Mobility per PT.    Balance                                           ADL either performed or assessed with clinical judgement   ADL Overall ADL's : Needs assistance/impaired Eating/Feeding: Set up   Grooming: Set up   Upper  Body Bathing: Set up;Independent   Lower Body Bathing: Moderate assistance   Upper Body Dressing : Independent;Set up   Lower Body Dressing: Moderate assistance       Toileting- Clothing Manipulation and Hygiene: Independent               Vision  No change from baseline       Perception     Praxis      Pertinent Vitals/Pain Pain Assessment: 0-10 Pain Score: 5  Faces Pain Scale: Hurts little more Pain Location: L HIP Pain Descriptors / Indicators: Aching Pain Intervention(s): Limited activity within patient's tolerance;Monitored during session     Hand Dominance     Extremity/Trunk Assessment Upper Extremity Assessment Upper Extremity Assessment: Overall WFL for tasks assessed           Communication Communication Communication: No difficulties   Cognition Arousal/Alertness: Awake/alert Behavior During Therapy: WFL for tasks assessed/performed Overall Cognitive Status: Within Functional Limits for tasks assessed                                     General Comments       Exercises   Shoulder Instructions      Home Living Family/patient expects to be discharged to:: Private residence Living Arrangements: Alone Available Help at Discharge: Family Type of Home: [place]  Access: Stairs to enter [employer] of Steps: 1   Home Layout: One level     Bathroom Shower/Tub: Walk-in shower         Home Equipment: Environmental consultant - 2 wheels;Cane - single point;Shower seat;Walker - 4 wheels          Prior Functioning/Environment Level of Independence: Independent with assistive device(s)        Comments: uses rollater for home mobility and SPC for community distances        OT Problem List: Decreased strength;Decreased range of motion;Decreased activity tolerance;Decreased knowledge of use of DME or AE;Pain      OT Treatment/Interventions: Self-care/ADL training;DME and/or AE instruction;Therapeutic  exercise;Patient/family education    OT Goals(Current goals can be found in the care plan section) Acute Rehab OT Goals Patient Stated Goal: to get stronger and go to rehab  OT Frequency: Min 2X/week   Barriers to D/C:            Co-evaluation              AM-PAC PT "6 Clicks" Daily Activity     Outcome Measure Help from another person eating meals?: None Help from another person taking care of personal grooming?: None Help from another person toileting, which includes using toliet, bedpan, or urinal?: A Little Help from another person bathing (including washing, rinsing, drying)?: A Lot Help from another person to put on and taking off regular upper body clothing?: None Help from another person to put on and taking off regular lower body clothing?: A Lot 6 Click Score: 19   End of Session    Activity Tolerance: Patient tolerated treatment well Patient left: in chair;with call bell/phone within reach;with chair alarm set  OT Visit Diagnosis: Unsteadiness on feet (R26.81);Pain;Muscle weakness (generalized) (M62.81)                Time: 1100-1119 OT Time Calculation (min): 19 min Charges:  OT General Charges $OT Visit: 1 Visit OT Evaluation $OT Eval Low Complexity: 1 Low G-Codes:    Harrel Carina, MS, OTR/L   Harrel Carina, MS, OTR/L 12/24/2017, 4:20 PM

## 2017-12-24 NOTE — Progress Notes (Signed)
Physical Therapy Treatment Patient Details Name: Joanne [name] MRN: [id number] DOB: 25-Mar-1931 Today's Date: 12/24/2017    History of Present Illness Pt admitted for L THR. HIstory of R THR 5 years ago.     PT Comments    Pt is making good progress towards goals with increased ambulation distance this session. Able to recite 2/3 hip precautions. Appears motivated to perform therapy. Reports pain decreased at end of session. Good endurance with there-ex. Will continue to progress.   Follow Up Recommendations  SNF     Equipment Recommendations  None recommended by PT    Recommendations for Other Services       Precautions / Restrictions Precautions Precautions: Fall;Posterior Hip Precaution Booklet Issued: No Restrictions Weight Bearing Restrictions: Yes LLE Weight Bearing: Weight bearing as tolerated    Mobility  Bed Mobility Overal bed mobility: Needs Assistance Bed Mobility: Supine to Sit     Supine to sit: Min assist     General bed mobility comments: improved technique this date. Once seated at EOB, able to sit with upright posture  Transfers Overall transfer level: Needs assistance Equipment used: Rolling walker (2 wheeled) Transfers: Sit to/from Stand Sit to Stand: Mod assist         General transfer comment: bed elevated for ease of transition. Upright posture noted with good WBing on L LE.  Ambulation/Gait Ambulation/Gait assistance: Min assist Ambulation Distance (Feet): 40 Feet Assistive device: Rolling walker (2 wheeled) Gait Pattern/deviations: Step-to pattern     General Gait Details: ambulated in room with heavy cues on sequencing and maintaining hip precautions during L turns. Fatigues with increased distance. Reports pain decreases with further mobility   Stairs             Wheelchair Mobility    Modified Rankin (Stroke Patients Only)       Balance                                            Cognition  Arousal/Alertness: Awake/alert Behavior During Therapy: WFL for tasks assessed/performed Overall Cognitive Status: Within Functional Limits for tasks assessed                                        Exercises Other Exercises Other Exercises: pt performed supine ther-ex on L LE including ankle pumps, quad sets, glut sets, SAQ, and hip abd/add. All ther-ex performed x 12 reps with min assist and safe technique    General Comments        Pertinent Vitals/Pain Pain Assessment: 0-10 Pain Score: 4  Pain Location: L HIP Pain Descriptors / Indicators: Operative site guarding Pain Intervention(s): Limited activity within patient's tolerance;RN gave pain meds during session    Home Living                      Prior Function            PT Goals (current goals can now be found in the care plan section) Acute Rehab PT Goals Patient Stated Goal: to get stronger and go to rehab PT Goal Formulation: With patient Time For Goal Achievement: 01/06/18 Potential to Achieve Goals: Good Progress towards PT goals: Progressing toward goals    Frequency    BID  PT Plan Current plan remains appropriate    Co-evaluation              AM-PAC PT "6 Clicks" Daily Activity  Outcome Measure  Difficulty turning over in bed (including adjusting bedclothes, sheets and blankets)?: Unable Difficulty moving from lying on back to sitting on the side of the bed? : Unable Difficulty sitting down on and standing up from a chair with arms (e.g., wheelchair, bedside commode, etc,.)?: Unable Help needed moving to and from a bed to chair (including a wheelchair)?: A Little Help needed walking in hospital room?: A Little Help needed climbing 3-5 steps with a railing? : A Lot 6 Click Score: 11    End of Session Equipment Utilized During Treatment: Gait belt Activity Tolerance: Patient tolerated treatment well Patient left: in chair;with chair alarm set;with SCD's  reapplied Nurse Communication: Mobility status PT Visit Diagnosis: Unsteadiness on feet (R26.81);Muscle weakness (generalized) (M62.81);Difficulty in walking, not elsewhere classified (R26.2);Pain Pain - Right/Left: Left Pain - part of body: Hip     Time: 0623-7628 PT Time Calculation (min) (ACUTE ONLY): 40 min  Charges:  $Gait Training: 23-37 mins $Therapeutic Exercise: 8-22 mins                    G Codes:       Joanne [name], PT, DPT [phone]    Joanne [name] 12/24/2017, 11:21 AM

## 2017-12-24 NOTE — Discharge Summary (Addendum)
Physician Discharge Summary  Patient ID: Joanne [name] MRN: [id number] DOB/AGE: 12-02-1930 84 y.o.  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 Anemia, unspecified 12/23/2017  . Crohn's disease ([place]) 12/23/2017  . Fuchs' corneal dystrophy 12/23/2017  . Hyperlipidemia, unspecified 12/23/2017  . Status post total replacement of hip 12/23/2017  . Benign essential HTN 12/16/2017  . Dyspnea on exertion 12/02/2017  . Protein-calorie malnutrition ([place]) 04/21/2017  . Age-related osteoporosis with current pathological fracture with routine healing 04/03/2017  . Other dysphagia 01/24/2017  . Hypokalemia 01/22/2017  . Zenker diverticulum 09/25/2016  . History of hypertension 05/03/2015  . Hyponatremia 02/21/2015  . Infected sebaceous cyst 11/10/2013  . Nuclear sclerotic cataract of right eye 09/08/2012  . Right shoulder injury 05/27/2002    Past Medical History:  Diagnosis Date  . Anemia, unspecified    B12 and iron deficiency   . Arthritis   . Crohn disease ([place])   . Crohn's disease ([place])    followed by Dr. Vira Agar with serial colonoscopies  . Fuchs' endothelial dystrophy   . GERD (gastroesophageal reflux disease)   . History of bone density study 09/01/2012  . Hyperlipidemia, unspecified   . Hypertension   . Hypokalemia   . Hypomagnesemia   . Osteoporosis   . Other dysphagia 01/24/2017  . Pneumonia    1980's  . Retinal detachment   . Right shoulder injury 05/2002   treated conservatively  . Trigger finger   . Zenker diverticulum 09/25/2016     Transfusion: No transfusions during this admission   Consultants (if any):   Discharged Condition: Improved  Hospital Course: Joanne [name] is an 84 y.o. [name] who was admitted 12/23/2017 with a diagnosis of degenerative arthrosis left hip and went to the operating room on  12/23/2017 and underwent the above named procedures.    Surgeries:Procedure(s): TOTAL HIP ARTHROPLASTY on 12/23/2017  PRE-OPERATIVE DIAGNOSIS: Degenerative arthrosis of the left hip, primary  POST-OPERATIVE DIAGNOSIS:  Same  PROCEDURE:  Left total hip arthroplasty  SURGEON:  Marciano Sequin. M.D.  ASSISTANT:  Vance Peper, PA (present and scrubbed throughout the case, critical for assistance with exposure, retraction, instrumentation, and closure)  ANESTHESIA: spinal  ESTIMATED BLOOD LOSS: 150 mL  FLUIDS REPLACED: 1150 mL of crystalloid  DRAINS: 2 medium drains to a Hemovac reservoir  IMPLANTS UTILIZED: DePuy 13.5 mm small stature AML femoral stem, 48 mm OD Pinnacle 100 acetabular component, +4 mm neutral Pinnacle Marathon polyethylene insert, and a 32 mm CoCr +1 mm hip ball  INDICATIONS FOR SURGERY: Joanne [name] is an 84 y.o. year old [name] with a long history of progressive hip and groin  pain. X-rays demonstrated severe degenerative changes. The patient had not seen any significant improvement despite conservative nonsurgical intervention. After discussion of the risks and benefits of surgical intervention, the patient expressed understanding of the risks benefits and agree with plans for total hip arthroplasty.   The risks, benefits, and alternatives were discussed at length including but not limited to the risks of infection, bleeding, nerve injury, stiffness, blood clots, the need for revision surgery, limb length inequality, dislocation, cardiopulmonary complications, among others, and they were willing to proceed.  Patient tolerated the surgery well. No complications .Patient was taken to PACU where she was stabilized and then transferred to the orthopedic floor.  Patient started on Lovenox 30 mg q 12  hrs. Foot pumps applied bilaterally at 80 mm hgb. Heels elevated off bed with rolled towels. No evidence of DVT. Calves non tender. Negative [place]. Physical therapy  started on day #1 for gait training and transfer with OT starting on  day #1 for ADL and assisted devices. Patient has done well with therapy. Ambulated 100 feet upon being discharged.  Patient's IV And Foley were discontinued on day #1 with Hemovac being discontinued on day #2. Dressing was changed on day 2 prior to patient being discharged   She was given perioperative antibiotics:  Anti-infectives (From admission, onward)   Start     Dose/Rate Route Frequency Ordered Stop   12/23/17 1400  ceFAZolin (ANCEF) IVPB 2g/100 mL premix     2 g 200 mL/hr over 30 Minutes Intravenous Every 6 hours 12/23/17 1137 12/24/17 1359   12/23/17 0603  ceFAZolin (ANCEF) 2-4 GM/100ML-% IVPB    Note to Pharmacy:  [place]: cabinet override      12/23/17 0603 12/23/17 0741   12/23/17 0600  ceFAZolin (ANCEF) IVPB 2g/100 mL premix     2 g 200 mL/hr over 30 Minutes Intravenous On call to O.R. 12/22/17 2133 12/23/17 0751    .  She was fitted with AV 1 compression foot pump devices, instructed on heel pumps, early ambulation, and fitted with TED stockings bilaterally for DVT prophylaxis.  She benefited maximally from the hospital stay and there were no complications.    Recent vital signs:  Vitals:   12/23/17 2346 12/24/17 0342  BP: (!) 165/69 (!) 153/73  Pulse: 64 66  Resp: 18 19  Temp: 98.4 F (36.9 C) 97.9 F (36.6 C)  SpO2: 95% 95%    Recent laboratory studies:  Lab Results  Component Value Date   HGB 10.2 (L) 12/23/2017   HGB 11.8 (L) 12/10/2017   HGB 10.4 (L) 04/04/2017   Lab Results  Component Value Date   WBC 8.6 12/10/2017   PLT 361 12/10/2017   Lab Results  Component Value Date   INR 0.91 12/10/2017   Lab Results  Component Value Date   NA 139 12/23/2017   K 3.9 12/23/2017   CL 98 (L) 12/10/2017   CO2 33 (H) 12/10/2017   BUN 21 (H) 12/10/2017   CREATININE 0.65 12/10/2017   GLUCOSE 99 12/23/2017    Discharge Medications:   Allergies as of 12/24/2017   No  Known Allergies     Medication List    TAKE these medications   acetaminophen 500 MG tablet Commonly known as:  TYLENOL Take 500 mg by mouth every 8 (eight) hours as needed for mild pain or moderate pain.   aspirin EC 325 MG tablet Take 325 mg by mouth daily. Notes to patient:  May begin taking after Lovenox injections are completed   budesonide 3 MG 24 hr capsule Commonly known as:  ENTOCORT EC Take 9 mg by mouth daily.   CALCIUM 600 + D PO Take 1 tablet by mouth 3 (three) times daily.   cholecalciferol 1000 units tablet Commonly known as:  VITAMIN D Take 1,000 Units by mouth daily.   CoQ10 100 MG Caps Take 2 capsules by mouth daily.   enoxaparin 30 MG/0.3ML injection Commonly known as:  LOVENOX Inject 0.3 mLs (30 mg total) into the skin every 12 (twelve) hours. Start taking on:  12/26/2017   gabapentin 300 MG capsule Commonly known as:  NEURONTIN Take 300 mg by mouth at bedtime.   gabapentin 100 MG capsule Commonly  known as:  NEURONTIN Take 1 capsule by mouth 2 (two) times daily. In AM and at noon   iron polysaccharides 150 MG capsule Commonly known as:  NIFEREX Take 150 mg by mouth daily.   LOPERAMIDE A-D 2 MG tablet Generic drug:  loperamide Take 2 mg by mouth 4 (four) times daily as needed for diarrhea or loose stools.   Magnesium 500 MG Tabs Take 1 tablet by mouth daily.   multivitamin tablet Take 1 tablet by mouth daily.   NEXIUM 40 MG capsule Generic drug:  esomeprazole Take 40 mg by mouth daily.   oxyCODONE 5 MG immediate release tablet Commonly known as:  Oxy IR/ROXICODONE Take 1 tablet (5 mg total) by mouth every 4 (four) hours as needed for moderate pain (pain score 4-6).   potassium chloride SA 20 MEQ tablet Commonly known as:  K-DUR,KLOR-CON Take 20 mEq by mouth 2 (two) times daily.   prednisoLONE acetate 1 % ophthalmic suspension Commonly known as:  PRED FORTE Place 1 drop into both eyes at bedtime.   PROBIOTIC DAILY PO Take 1  capsule by mouth daily.   torsemide 5 MG tablet Commonly known as:  DEMADEX Take 5 mg by mouth daily as needed.   traMADol 50 MG tablet Commonly known as:  ULTRAM Take 1-2 tablets (50-100 mg total) by mouth every 4 (four) hours as needed for moderate pain. What changed:    how much to take  reasons to take this   vitamin B-12 1000 MCG tablet Commonly known as:  CYANOCOBALAMIN Take 1,000 mcg by mouth daily.   vitamin C 500 MG tablet Commonly known as:  ASCORBIC ACID Take 500 mg by mouth daily.            Durable Medical Equipment  (From admission, onward)        Start     Ordered   12/23/17 1138  DME Walker rolling  Once    Question:  Patient needs a walker to treat with the following condition  Answer:  S/P total hip arthroplasty   12/23/17 1137   12/23/17 1138  DME Bedside commode  Once    Question:  Patient needs a bedside commode to treat with the following condition  Answer:  S/P total hip arthroplasty   12/23/17 1137      Diagnostic Studies: Dg Hip Port Unilat With Pelvis 1v Left  Result Date: 12/23/2017 CLINICAL DATA:  Postop arthroplasty. EXAM: DG HIP (WITH OR WITHOUT PELVIS) 1V PORT LEFT COMPARISON:  CT pelvis 04/01/2017. FINDINGS: Two AP pelvis and a cross-table lateral radiograph of the left hip are submitted. Interval left total hip arthroplasty. The hardware appears well positioned. The left periarticular soft tissues are incompletely visualized. Surgical drain and skin staples are in place. No evidence of acute fracture or dislocation. Previous right hip arthroplasty appears stable. IMPRESSION: Interval left hip arthroplasty without demonstrated complication. Electronically Signed   By: Richardean Sale M.D.   On: 12/23/2017 10:46    Disposition:     Follow-up Information    Dereck Leep, MD On 02/04/2018.   Specialty:  Orthopedic Surgery Why:  at 10:00am Contact information: [place]  [address] [phone]            Signed: Watt Climes 12/24/2017, 7:43 AM

## 2017-12-24 NOTE — Progress Notes (Signed)
Per [facility] admissions coordinator at [facility] authorization has been received. Patient can D/C to [facility] when medically stable. Patient is aware of above.   [employer], LCSW [phone]

## 2017-12-24 NOTE — Clinical Social Work Placement (Signed)
   CLINICAL SOCIAL WORK PLACEMENT  NOTE  Date:  12/24/2017  Patient Details  Name: Joanne [name] MRN: [id number] Date of Birth: February 21, 1931  Clinical Social Work is seeking post-discharge placement for this patient at the [place] level of care (*CSW will initial, date and re-position this form in  chart as items are completed):  Yes   Patient/family provided with [place] Work Department's list of facilities offering this level of care within the geographic area requested by the patient (or if unable, by the patient's family).  Yes   Patient/family informed of their freedom to choose among providers that offer the needed level of care, that participate in Medicare, Medicaid or managed care program needed by the patient, have an available bed and are willing to accept the patient.  Yes   Patient/family informed of [facility]'s ownership interest in [facility] and [facility], as well as of the fact that they are under no obligation to receive care at these facilities.  PASRR submitted to EDS on       PASRR number received on       Existing PASRR number confirmed on 12/23/17     FL2 transmitted to all facilities in geographic area requested by pt/family on 12/23/17     FL2 transmitted to all facilities within larger geographic area on       Patient informed that his/her managed care company has contracts with or will negotiate with certain facilities, including the following:        Yes   Patient/family informed of bed offers received.  Patient chooses bed at [facility] )     Physician recommends and patient chooses bed at      Patient to be transferred to   on  .  Patient to be transferred to facility by       Patient family notified on   of transfer.  Name of family member notified:        PHYSICIAN       Additional Comment:    _______________________________________________ Tangala Wiegert, Veronia Beets, LCSW 12/24/2017, 8:47  AM

## 2017-12-24 NOTE — Progress Notes (Signed)
Physical Therapy Treatment Patient Details Name: Joanne [name] MRN: [id number] DOB: 1931-02-22 Today's Date: 12/24/2017    History of Present Illness Pt admitted for L THR. HIstory of R THR 5 years ago.     PT Comments    Pt is making good progress towards goals with ability to progress ambulation distance and focus on gait quality. Able to demonstrate reciprocal gait pattern with cues for looking forward instead of at feet. Still needs cues to maintain hip precautions during functional mobility. Appears motivated to participate, good endurance noted with there-ex. Will continue to progress.   Follow Up Recommendations  SNF     Equipment Recommendations  None recommended by PT    Recommendations for Other Services       Precautions / Restrictions Precautions Precautions: Fall;Posterior Hip Precaution Booklet Issued: Yes (comment) Restrictions Weight Bearing Restrictions: Yes LLE Weight Bearing: Weight bearing as tolerated    Mobility  Bed Mobility               General bed mobility comments: not performed as pt received seated in recliner  Transfers Overall transfer level: Needs assistance Equipment used: Rolling walker (2 wheeled) Transfers: Sit to/from Stand Sit to Stand: Min assist         General transfer comment: has increased ease transferring from recliner as she is able to push from arm rest. Once standing able to stand with upright posture.  Ambulation/Gait Ambulation/Gait assistance: Min assist Ambulation Distance (Feet): 80 Feet Assistive device: Rolling walker (2 wheeled) Gait Pattern/deviations: Step-through pattern     General Gait Details: ambulated in hallway progressing from step to gait pattern to reciprocal gait pattern. Needs cues to keep head looking forward rather than looking down. Still needs heavy cues for maintaining hip precautions during L turns   Stairs             Wheelchair Mobility    Modified Rankin (Stroke  Patients Only)       Balance                                            Cognition Arousal/Alertness: Awake/alert Behavior During Therapy: WFL for tasks assessed/performed Overall Cognitive Status: Within Functional Limits for tasks assessed                                        Exercises Other Exercises Other Exercises: Seated ther-ex performed x 12 reps on L hip including ankle pumps, quad sets, glut sets, SAQ, hip abd/add, and LAQ. Written HEP reviewed and educated on frequency and duration. Min assist needed for ther-ex.    General Comments        Pertinent Vitals/Pain Pain Assessment: 0-10 Pain Score: 3  Pain Location: L HIP Pain Descriptors / Indicators: Operative site guarding Pain Intervention(s): Limited activity within patient's tolerance;Ice applied    Home Living                      Prior Function            PT Goals (current goals can now be found in the care plan section) Acute Rehab PT Goals Patient Stated Goal: to get stronger and go to rehab PT Goal Formulation: With patient Time For Goal Achievement: 01/06/18 Potential to Achieve  Goals: Good Progress towards PT goals: Progressing toward goals    Frequency    BID      PT Plan Current plan remains appropriate    Co-evaluation              AM-PAC PT "6 Clicks" Daily Activity  Outcome Measure  Difficulty turning over in bed (including adjusting bedclothes, sheets and blankets)?: Unable Difficulty moving from lying on back to sitting on the side of the bed? : Unable Difficulty sitting down on and standing up from a chair with arms (e.g., wheelchair, bedside commode, etc,.)?: Unable Help needed moving to and from a bed to chair (including a wheelchair)?: A Little Help needed walking in hospital room?: A Little Help needed climbing 3-5 steps with a railing? : A Lot 6 Click Score: 11    End of Session Equipment Utilized During Treatment:  Gait belt Activity Tolerance: Patient tolerated treatment well Patient left: in chair;with chair alarm set;with SCD's reapplied Nurse Communication: Mobility status PT Visit Diagnosis: Unsteadiness on feet (R26.81);Muscle weakness (generalized) (M62.81);Difficulty in walking, not elsewhere classified (R26.2);Pain Pain - Right/Left: Left Pain - part of body: Hip     Time: 3383-2919 PT Time Calculation (min) (ACUTE ONLY): 28 min  Charges:  $Gait Training: 8-22 mins $Therapeutic Exercise: 8-22 mins                    G Codes:       Greggory Stallion, PT, DPT [phone]    [name] 12/24/2017, 3:42 PM

## 2017-12-24 NOTE — Progress Notes (Signed)
ORTHOPAEDICS PROGRESS NOTE  PATIENT NAME: Joanne [name] DOB: Feb 12, 1931  MRN: [id number]  POD # 1: Left total hip arthroplasty  Subjective: Pain is under good control at this time.  No nausea or vomiting. The patient tolerated physical therapy well yesterday afternoon following surgery.  Objective: Vital signs in last 24 hours: Temp:  [97 F (36.1 C)-98.4 F (36.9 C)] 97.9 F (36.6 C) (04/30 0342) Pulse Rate:  [53-67] 66 (04/30 0342) Resp:  [14-25] 19 (04/30 0342) BP: (95-181)/(49-89) 153/73 (04/30 0342) SpO2:  [95 %-100 %] 95 % (04/30 0342)  Intake/Output from previous day: 04/29 0701 - 04/30 0700 In: 5583.3 [P.O.:1800; I.V.:3073.3; IV Piggyback:710] Out: 3590 [Urine:3250; Drains:190; Blood:150]  Recent Labs    12/23/17 0659  HGB 10.2*  HCT 30.0*  K 3.9  GLUCOSE 99    EXAM General: Well-developed well-nourished female seen in no apparent discomfort. Lungs: clear to auscultation Cardiac: normal rate and regular rhythm Left lower extremity: Hip dressing is dry and clean.  Hemovac drain is in place.  No significant thigh swelling or ecchymosis.  Homans test is negative. Neurologic: Awake, alert, and oriented.  Sensory and motor function are intact.  Assessment: Left total hip arthroplasty  Secondary diagnoses: Osteoporosis Hypertension Hyperlipidemia Gastroesophageal reflux disease Fuchs endothelial dystrophy Crohn's disease  Plan: Today's goals were reviewed with the patient.  Continue with physical therapy and Occupational Therapy as per total hip arthroplasty rehab protocol.  Posterior hip precautions are to be followed. Plan is to go Skilled nursing facility after hospital stay. DVT Prophylaxis - Lovenox, Foot Pumps and TED hose  James P. Holley Bouche M.D.

## 2017-12-24 NOTE — Clinical Social Work Note (Signed)
Clinical Social Work Assessment  Patient Details  Name: Joanne [name] MRN: [id number] Date of Birth: June 08, 1931  Date of referral:  12/24/17               Reason for consult:  Facility Placement, Discharge Planning                Permission sought to share information with:  Chartered certified accountant granted to share information::  Yes, Verbal Permission Granted  Name::       [place]::   [place]  Relationship::     Contact Information:     Housing/Transportation Living arrangements for the past 2 months:  [place] of Information:  Patient, Power of [place], Adult Children Patient Interpreter Needed:  None Criminal Activity/Legal Involvement Pertinent to Current Situation/Hospitalization:  No - Comment as needed Significant Relationships:  Adult Children Lives with:  Self Do you feel safe going back to the place where you live?  Yes Need for family participation in patient care:  Yes (Comment)  Care giving concerns: Patient lives alone in [place].   Social Worker assessment / plan: Holiday representative (CSW) received SNF consult. PT is recommending SNF. Social work Theatre manager met with patient and daughter/HPOA Joanne [name] [phone]) at bedside. Patient was sitting up in bed having breakfast, alert and oriented x4. Social work Theatre manager introduced self and explained the role of the [place]. Patient shared she lives alone in [place]. Social work Theatre manager explained that PT is recommending SNF and [employer] will have to approve SNF placement. Patient and her daughter verbalized their understanding and are agreeable to SNF placement. Patient prefers [facility] and is comfortable with a semi-private room. FL2 completed and faxed out.  Social work Theatre manager presented bed offers to patient and her daughter Joanne [name]. Patient chose [facility]. Joanne [name], admission's coordinator at [facility], is aware of accepted bed offer and  will start [facility] SNF authorizatiion. CSW and Social work Theatre manager will continue to follow up and assist.  Employment status:  Retired Nurse, adult PT Recommendations:  [place] / Referral to community resources:  [place]  Patient/Family's Response to care: Patient is agreeable to SNF placement and chose [facility].  Patient/Family's Understanding of and Emotional Response to Diagnosis, Current Treatment, and Prognosis: Patient and her daughter were both pleasant and thanked Social work Theatre manager for her assistance.  Emotional Assessment Appearance:  Appears stated age Attitude/Demeanor/Rapport:    Affect (typically observed):  Accepting, Pleasant, Calm Orientation:  Oriented to Self, Oriented to Place, Oriented to  Time, Oriented to Situation Alcohol / Substance use:  Not Applicable Psych involvement (Current and /or in the community):  No (Comment)  Discharge Needs  Concerns to be addressed:  Care Coordination, Discharge Planning Concerns Readmission within the last 30 days:  No Current discharge risk:  Dependent with Mobility Barriers to Discharge:  Continued Medical Work up   Joanne [name], Student-Social Work 12/24/2017, 8:43 AM

## 2017-12-24 NOTE — Progress Notes (Signed)
Per CNA pt was ambulating to bathroom, and hemovac came out. Pressure applied and guaze and Tegaderm applied. MD Hooten notified. No new orders.

## 2017-12-25 ENCOUNTER — Other Ambulatory Visit: Payer: Self-pay

## 2017-12-25 ENCOUNTER — Encounter: Admission: RE | Admit: 2017-12-25 | Payer: Medicare Other | Source: Ambulatory Visit | Admitting: Internal Medicine

## 2017-12-25 LAB — SURGICAL PATHOLOGY

## 2017-12-25 MED ORDER — TRAMADOL HCL 50 MG PO TABS: 50.0000 mg | ORAL_TABLET | ORAL | 0 refills | Status: DC | PRN

## 2017-12-25 MED ORDER — OXYCODONE HCL 5 MG PO TABS: 5.0000 mg | ORAL_TABLET | ORAL | 0 refills | Status: DC | PRN

## 2017-12-25 NOTE — Clinical Social Work Placement (Signed)
   CLINICAL SOCIAL WORK PLACEMENT  NOTE  Date:  12/25/2017  Patient Details  Name: Joanne [name] MRN: [id number] Date of Birth: 1930/12/21  Clinical Social Work is seeking post-discharge placement for this patient at the [place] level of care (*CSW will initial, date and re-position this form in  chart as items are completed):  Yes   Patient/family provided with [place] Work Department's list of facilities offering this level of care within the geographic area requested by the patient (or if unable, by the patient's family).  Yes   Patient/family informed of their freedom to choose among providers that offer the needed level of care, that participate in Medicare, Medicaid or managed care program needed by the patient, have an available bed and are willing to accept the patient.  Yes   Patient/family informed of [place]'s ownership interest in [facility] and [facility], as well as of the fact that they are under no obligation to receive care at these facilities.  PASRR submitted to EDS on       PASRR number received on       Existing PASRR number confirmed on 12/23/17     FL2 transmitted to all facilities in geographic area requested by pt/family on 12/23/17     FL2 transmitted to all facilities within larger geographic area on       Patient informed that his/her managed care company has contracts with or will negotiate with certain facilities, including the following:        Yes   Patient/family informed of bed offers received.  Patient chooses bed at [facility] )     Physician recommends and patient chooses bed at      Patient to be transferred to [facility] ) on 12/25/17.  Patient to be transferred to facility by [facility] )     Patient family notified on 12/25/17 of transfer.  Name of family member notified:  (Patient's daughter [name] is aware of D/C today. )     PHYSICIAN       Additional  Comment:    _______________________________________________ Dakoda Bassette, Veronia Beets, LCSW 12/25/2017, 11:36 AM

## 2017-12-25 NOTE — Progress Notes (Signed)
Rept to [facility] at [place]. Pt ready for d/c to [facility]. No change in pt from AM assessment. IV d/c'd. Pt pain tolerable with prescribed pain regimen. Vital signs remain stable.

## 2017-12-25 NOTE — Progress Notes (Signed)
   Subjective: 2 Days Post-Op Procedure(s) (LRB): TOTAL HIP ARTHROPLASTY (Left) Patient reports pain as 5 on 0-10 scale.   Patient is well, and has had no acute complaints or problems We will start therapy today.  Plan is to go Rehab after hospital stay. no nausea and no vomiting Patient denies any chest pains or shortness of breath. Objective: Vital signs in last 24 hours: Temp:  [98 F (36.7 C)-98.4 F (36.9 C)] 98 F (36.7 C) (04/30 2345) Pulse Rate:  [66-69] 69 (04/30 2345) Resp:  [14-18] 14 (04/30 2345) BP: (144-151)/(48-59) 151/48 (04/30 2345) SpO2:  [93 %-97 %] 93 % (04/30 2345) well approximated incision Heels are non tender and elevated off the bed using rolled towels Intake/Output from previous day: 04/30 0701 - 05/01 0700 In: 1080 [P.O.:1080] Out: -  Intake/Output this shift: Total I/O In: 480 [P.O.:480] Out: -   Recent Labs    12/23/17 0659  HGB 10.2*   Recent Labs    12/23/17 0659  HCT 30.0*   Recent Labs    12/23/17 0659  NA 139  K 3.9  GLUCOSE 99   No results for input(s): LABPT, INR in the last 72 hours.  EXAM General - Patient is Alert, Appropriate and Oriented Extremity - Neurologically intact Neurovascular intact Sensation intact distally Intact pulses distally Dorsiflexion/Plantar flexion intact No cellulitis present Compartment soft Dressing - scant drainage Motor Function - intact, moving foot and toes well on exam.    Past Medical History:  Diagnosis Date  . Anemia, unspecified    B12 and iron deficiency   . Arthritis   . Crohn disease ([place])   . Crohn's disease ([place])    followed by Dr. Vira Agar with serial colonoscopies  . Fuchs' endothelial dystrophy   . GERD (gastroesophageal reflux disease)   . History of bone density study 09/01/2012  . Hyperlipidemia, unspecified   . Hypertension   . Hypokalemia   . Hypomagnesemia   . Osteoporosis   . Other dysphagia 01/24/2017  . Pneumonia    1980's  . Retinal detachment   .  Right shoulder injury 05/2002   treated conservatively  . Trigger finger   . Zenker diverticulum 09/25/2016    Assessment/Plan: 2 Days Post-Op Procedure(s) (LRB): TOTAL HIP ARTHROPLASTY (Left) Active Problems:   Status post total replacement of hip  Estimated body mass index is 31.44 kg/m as calculated from the following:   Height as of this encounter: 5' (1.524 m).   Weight as of this encounter: 73 kg (161 lb). Up with therapy Discharge to SNF  Labs: None DVT Prophylaxis - Lovenox, Foot Pumps and TED hose Weight-Bearing as tolerated to left leg Please change dressing prior to patient being discharged to rehab  Jon R. Paraje Hohenwald 12/25/2017, 6:51 AM

## 2017-12-25 NOTE — Progress Notes (Signed)
Non-emergency transport called for patient. Family at bedside. Pt and family made aware of transport.

## 2017-12-25 NOTE — Telephone Encounter (Signed)
Rx sent to [facility] phone : [phone] , fax : [phone]

## 2017-12-25 NOTE — Anesthesia Postprocedure Evaluation (Signed)
Anesthesia Post Note  Patient: Joanne [name]  Procedure(s) Performed: TOTAL HIP ARTHROPLASTY (Left )  Patient location during evaluation: PACU Anesthesia Type: Spinal Level of consciousness: awake, awake and alert and oriented Pain management: pain level controlled Vital Signs Assessment: post-procedure vital signs reviewed and stable Respiratory status: spontaneous breathing Cardiovascular status: blood pressure returned to baseline Postop Assessment: no headache and no backache Anesthetic complications: no     Last Vitals:  Vitals:   12/24/17 1549 12/24/17 2345  BP: (!) 144/59 (!) 151/48  Pulse: 66 69  Resp: 18 14  Temp: 36.9 C 36.7 C  SpO2: 97% 93%    Last Pain:  Vitals:   12/24/17 2345  TempSrc: Oral  PainSc:                  Hedda Slade

## 2017-12-25 NOTE — Progress Notes (Signed)
Physical Therapy Treatment Patient Details Name: Joanne [name] MRN: [id number] DOB: [birth year] Today's Date: 12/25/2017    History of Present Illness Pt. is an 84 y.o. female who was admitted to [facility] for a Left THR. PMx includes: Right THR s/p 5 years ago.    PT Comments    Pt is making good progress towards goals with increased ambulation noted this session, still requires assist for mobility and cues for hip precautions. Can recite 2/3 hip precautions. Good progress towards there-ex and reviewed written HEP. Will continue to progress.   Follow Up Recommendations  SNF     Equipment Recommendations  None recommended by PT    Recommendations for Other Services       Precautions / Restrictions Precautions Precautions: Fall;Posterior Hip Precaution Booklet Issued: Yes (comment) Restrictions Weight Bearing Restrictions: Yes LLE Weight Bearing: Weight bearing as tolerated    Mobility  Bed Mobility               General bed mobility comments: not performed as pt received seated in recliner  Transfers Overall transfer level: Needs assistance Equipment used: Rolling walker (2 wheeled) Transfers: Sit to/from Stand Sit to Stand: Min assist         General transfer comment: improved technique this date, however still needs cues to push from seated surface. Once standing, upright posture noted  Ambulation/Gait Ambulation/Gait assistance: Min guard Ambulation Distance (Feet): 140 Feet Assistive device: Rolling walker (2 wheeled) Gait Pattern/deviations: Step-through pattern     General Gait Details: ambulated in hallway with reciprocal gait pattern and fluid steps. Still has difficulty with L turns, needs cues for correct technique. Able to look forward instead of at floor. INcreased distance   Stairs             Wheelchair Mobility    Modified Rankin (Stroke Patients Only)       Balance                                             Cognition Arousal/Alertness: Awake/alert Behavior During Therapy: WFL for tasks assessed/performed Overall Cognitive Status: Within Functional Limits for tasks assessed                                        Exercises Other Exercises Other Exercises: Seated ther-ex performed x 15 reps on L hip including ankle pumps, quad sets, glut sets, SAQ, hip abd/add, and LAQ. Written HEP reviewed and educated on frequency and duration. Min assist needed for ther-ex.    General Comments        Pertinent Vitals/Pain Pain Assessment: 0-10 Pain Score: 3  Pain Location: L HIP Pain Descriptors / Indicators: Aching Pain Intervention(s): Limited activity within patient's tolerance;Premedicated before session    Home Living                      Prior Function            PT Goals (current goals can now be found in the care plan section) Acute Rehab PT Goals Patient Stated Goal: to get stronger and go to rehab PT Goal Formulation: With patient Time For Goal Achievement: 01/06/18 Potential to Achieve Goals: Good Progress towards PT goals: Progressing toward goals    Frequency  BID      PT Plan Current plan remains appropriate    Co-evaluation              AM-PAC PT "6 Clicks" Daily Activity  Outcome Measure  Difficulty turning over in bed (including adjusting bedclothes, sheets and blankets)?: Unable Difficulty moving from lying on back to sitting on the side of the bed? : Unable Difficulty sitting down on and standing up from a chair with arms (e.g., wheelchair, bedside commode, etc,.)?: Unable Help needed moving to and from a bed to chair (including a wheelchair)?: A Little Help needed walking in hospital room?: A Little Help needed climbing 3-5 steps with a railing? : A Lot 6 Click Score: 11    End of Session Equipment Utilized During Treatment: Gait belt Activity Tolerance: Patient tolerated treatment well Patient left: in chair;with chair  alarm set;with SCD's reapplied Nurse Communication: Mobility status PT Visit Diagnosis: Unsteadiness on feet (R26.81);Muscle weakness (generalized) (M62.81);Difficulty in walking, not elsewhere classified (R26.2);Pain Pain - Right/Left: Left Pain - part of body: Hip     Time: 3736-6815 PT Time Calculation (min) (ACUTE ONLY): 23 min  Charges:  $Gait Training: 8-22 mins $Therapeutic Exercise: 8-22 mins                    G Codes:       Joanne [name], PT, DPT [phone]    Joanne [name] 12/25/2017, 10:58 AM

## 2017-12-25 NOTE — Progress Notes (Signed)
Patient is medically stable for D/C to [facility] today. Per [facility] admissions coordinator at [facility] patient can come today to room 207-B. RN will call report at [phone] and arrange EMS for transport. Clinical Education officer, museum (CSW) sent D/C orders to [facility] via [employer]. Patient is aware of above and in agreement with a semi-private room. CSW contacted patient's daughter [name] and made her aware of above. Please reconsult if future social work needs arise. CSW signing off.   [employer], LCSW [phone]

## 2018-01-03 ENCOUNTER — Encounter: Payer: Self-pay | Admitting: Gerontology

## 2018-01-03 ENCOUNTER — Non-Acute Institutional Stay (SKILLED_NURSING_FACILITY): Payer: Medicare Other | Admitting: Gerontology

## 2018-01-03 DIAGNOSIS — M87052 Idiopathic aseptic necrosis of left femur: Secondary | ICD-10-CM

## 2018-01-03 DIAGNOSIS — Z96642 Presence of left artificial hip joint: Secondary | ICD-10-CM

## 2018-01-03 NOTE — Progress Notes (Signed)
Location:   [facility] Room Number: 207B Place of Service:  SNF [phone]) Provider:  Toni Arthurs, NP-C  Tracie Harrier, MD  Patient Care Team: Tracie Harrier, MD as PCP - General (Internal Medicine) Tracie Harrier, MD as Physician Assistant (Internal Medicine) Bary Castilla Forest Gleason, MD (General Surgery)  Extended Emergency Contact Information Primary Emergency Contact: Price,Cynthia Address: [address]          [place], [place] [address] Johnnette Litter of [place] Mobile Phone: [phone] Relation: Daughter Secondary Emergency Contact: Crowder,Vicky Address: [address], [place] [address] Johnnette Litter of [employer] Phone: [phone] Relation: Daughter  Code Status:  FULL Goals of care: Advanced Directive information Advanced Directives 01/03/2018  Does Patient Have a Medical Advance Directive? No  Type of Advance Directive -  Does patient want to make changes to medical advance directive? -  Copy of [place] in Chart? -     Chief Complaint  Patient presents with  . Medical Management of Chronic Issues    Routine Visit    HPI:  Pt is a 84 y.o. female seen today for medical management of chronic diseases. Pt was admitted to the facility for rehab following hospitalization for Left Total Hip Replacement d/t Avascular Necrosis of the Left Hip joint. Pt has been participating in PT/OT. Pt has been progressing well. Pt reports her pain is well controlled on current regimen. Some edema to the leg. But not painful/ Negative Homan's sign. Pt reports her appetite is good and is voiding well. She is having diarrhea, but reports she has crohn's and the diarrhea is "normal" for her. She has a f/u appointment scheduled with GI soon. Otherwise, pt reports she is doing well ans is without complaints. VSS.      Past Medical History:  Diagnosis Date  . Anemia, unspecified    B12 and iron deficiency   .  Arthritis   . Crohn disease ([place])   . Crohn's disease ([place])    followed by Dr. Vira Agar with serial colonoscopies  . Fuchs' endothelial dystrophy   . GERD (gastroesophageal reflux disease)   . History of bone density study 09/01/2012  . Hyperlipidemia, unspecified   . Hypertension   . Hypokalemia   . Hypomagnesemia   . Osteoporosis   . Other dysphagia 01/24/2017  . Pneumonia    1980's  . Retinal detachment   . Right shoulder injury 05/2002   treated conservatively  . Trigger finger   . Zenker diverticulum 09/25/2016   Past Surgical History:  Procedure Laterality Date  . APPENDECTOMY  1949  . CATARACT EXTRACTION EXTRACAPSULAR Right 05/10/2015   Procedure: EXTRACTION CATARACT EXTRACAPSULAR W/INSERTION INTRAOCULAR PROSTHESIS; Surgeon: Doyce Para, MD; [facility]; Service: Ophthalmology; Laterality: Right;  . CHOLECYSTECTOMY  1992  . COLONOSCOPY     03/24/1992, 03/11/1998, 07/08/2002, 05/08/2006, 01/31/2012   PH Crohns disease; no repeat due to age per RTE (dw)  . CORNEAL TRANSPLANT Left 07/08/2012   Procedure: CORNEAL TRANSPLANT DSAEK ** tissue ordered OSI 06/27/2012; Surgeon: Doyce Para, MD; Location: [facility]; Service: Ophthalmology;  . ESOPHAGOGASTRODUODENOSCOPY     05/01/2005, 01/31/2012 ; No repeat per RTE  . EYE SURGERY  2012, 2013   5 total  . JOINT REPLACEMENT Right 01-28-13  . LENSECTOMY PHACOFRAGMENTATION WITH ASPIRATION Left 07/25/2011  . TONSILLECTOMY  1950  . TOTAL HIP ARTHROPLASTY Left 12/23/2017   Procedure: TOTAL HIP ARTHROPLASTY;  Surgeon: Skip Estimable  P, MD;  Location: [facility];  Service: Orthopedics;  Laterality: Left;  Marland Kitchen VITREOUS RETINAL SURGERY Left 11/07/2011   PPV/SO  . VITREOUS RETINAL SURGERY Left 04/18/2011   TPPV/TRP/SO/SB/EL/C3F8    Allergies  Allergen Reactions  . Oxycodone Other (See Comments)    "makes her crazy"    Allergies as of 01/03/2018      Reactions   Oxycodone Other (See Comments)   "makes her crazy"      Medication List          Accurate as of 01/03/18 11:34 AM. Always use your most recent med list.          acetaminophen 500 MG tablet Commonly known as:  TYLENOL Take 500 mg by mouth every 8 (eight) hours as needed for mild pain or moderate pain.   aspirin EC 325 MG tablet Take 325 mg by mouth daily.   budesonide 3 MG 24 hr capsule Commonly known as:  ENTOCORT EC Take 9 mg by mouth daily.   CALCIUM 600 + D PO Take 1 tablet by mouth 3 (three) times daily.   cholecalciferol 1000 units tablet Commonly known as:  VITAMIN D Take 1,000 Units by mouth daily.   CoQ10 100 MG Caps Take 2 capsules by mouth daily.   enoxaparin 30 MG/0.3ML injection Commonly known as:  LOVENOX Inject 0.3 mLs (30 mg total) into the skin every 12 (twelve) hours.   gabapentin 300 MG capsule Commonly known as:  NEURONTIN Take 300 mg by mouth at bedtime.   gabapentin 100 MG capsule Commonly known as:  NEURONTIN Take 1 capsule by mouth 2 (two) times daily. In AM and at noon   iron polysaccharides 150 MG capsule Commonly known as:  NIFEREX Take 150 mg by mouth daily.   LOPERAMIDE A-D 2 MG tablet Generic drug:  loperamide Take 2 mg by mouth 4 (four) times daily as needed for diarrhea or loose stools.   Magnesium Oxide 250 MG Tabs Take 500 mg by mouth daily.   multivitamin tablet Take 1 tablet by mouth daily.   NEXIUM 40 MG capsule Generic drug:  esomeprazole Take 40 mg by mouth daily.   potassium chloride SA 20 MEQ tablet Commonly known as:  K-DUR,KLOR-CON Take 20 mEq by mouth 2 (two) times daily.   prednisoLONE acetate 1 % ophthalmic suspension Commonly known as:  PRED FORTE Place 1 drop into both eyes at bedtime.   RISA-BID PROBIOTIC Tabs Take 1 tablet by mouth daily.   torsemide 5 MG tablet Commonly known as:  DEMADEX Take 5 mg by mouth daily as needed.   traMADol 50 MG tablet Commonly known as:  ULTRAM Take 1-2 tablets (50-100 mg total) by mouth every 4 (four) hours as needed for moderate  pain.   traMADol 50 MG tablet Commonly known as:  ULTRAM Take 1 tablet (50 mg total) by mouth every 4 (four) hours as needed.   vitamin B-12 1000 MCG tablet Commonly known as:  CYANOCOBALAMIN Take 1,000 mcg by mouth daily.   vitamin C 500 MG tablet Commonly known as:  ASCORBIC ACID Take 500 mg by mouth daily.       Review of Systems  Constitutional: Negative for activity change, appetite change, chills, diaphoresis and fever.  HENT: Negative for congestion, mouth sores, nosebleeds, postnasal drip, sneezing, sore throat, trouble swallowing and voice change.   Respiratory: Negative for apnea, cough, choking, chest tightness, shortness of breath and wheezing.   Cardiovascular: Positive for leg swelling. Negative for chest pain and  palpitations.  Gastrointestinal: Positive for diarrhea. Negative for abdominal distention, abdominal pain, constipation and nausea.  Genitourinary: Negative for difficulty urinating, dysuria, frequency and urgency.  Musculoskeletal: Positive for arthralgias (typical arthritis). Negative for back pain, gait problem and myalgias.  Skin: Negative for color change, pallor, rash and wound.  Neurological: Positive for weakness. Negative for dizziness, tremors, syncope, speech difficulty, numbness and headaches.  Psychiatric/Behavioral: Negative for agitation and behavioral problems.  All other systems reviewed and are negative.   Immunization History  Administered Date(s) Administered  . Influenza-Unspecified 06/03/2015, 05/21/2016  . Pneumococcal Conjugate-13 05/27/2014  . Pneumococcal Polysaccharide-23 10/28/2009  . Tdap 01/28/2017   Pertinent  Health Maintenance Due  Topic Date Due  . DEXA SCAN  06/20/1998  . INFLUENZA VACCINE  03/27/2018  . PNA vac Low Risk Adult  Completed   No flowsheet data found. Functional Status Survey:    Vitals:   01/03/18 1121  BP: (!) 147/63  Pulse: 76  Resp: 18  Temp: 98 F (36.7 C)  TempSrc: Oral  SpO2: 96%   Weight: 167 lb 8 oz (76 kg)  Height: 5' (1.524 m)   Body mass index is 32.71 kg/m. Physical Exam  Constitutional: She is oriented to person, place, and time. Vital signs are normal. She appears well-developed and well-nourished. She is active and cooperative. She does not appear ill. No distress.  HENT:  Head: Normocephalic and atraumatic.  Mouth/Throat: Uvula is midline, oropharynx is clear and moist and mucous membranes are normal. Mucous membranes are not pale, not dry and not cyanotic.  Eyes: Pupils are equal, round, and reactive to light. Conjunctivae, EOM and lids are normal.  Neck: Trachea normal, normal range of motion and full passive range of motion without pain. Neck supple. No JVD present. No tracheal deviation, no edema and no erythema present. No thyromegaly present.  Cardiovascular: Normal rate, regular rhythm, normal heart sounds, intact distal pulses and normal pulses. Exam reveals no gallop, no distant heart sounds and no friction rub.  No murmur heard. Pulses:      Dorsalis pedis pulses are 2+ on the right side, and 2+ on the left side.  2+ BLE taut edema- has prn Torsemide  Pulmonary/Chest: Effort normal and breath sounds normal. No accessory muscle usage. No respiratory distress. She has no decreased breath sounds. She has no wheezes. She has no rhonchi. She has no rales. She exhibits no tenderness.  Abdominal: Soft. Normal appearance and bowel sounds are normal. She exhibits no distension and no ascites. There is no tenderness.  Musculoskeletal: She exhibits no edema or tenderness.       Left hip: She exhibits decreased range of motion, decreased strength and laceration.  Expected osteoarthritis, stiffness; Bilateral Calves soft, supple. Negative Homan's Sign. B- pedal pulses equal  Neurological: She is alert and oriented to person, place, and time. She has normal strength.  Skin: Skin is warm and dry. Laceration noted. She is not diaphoretic. No cyanosis. No pallor.  Nails show no clubbing.  Psychiatric: She has a normal mood and affect. Her speech is normal and behavior is normal. Judgment and thought content normal. Cognition and memory are normal.  Nursing note and vitals reviewed.   Labs reviewed: Recent Labs    01/22/17 1221 01/23/17 0624 04/01/17 1719 04/04/17 0545 12/10/17 1038 12/23/17 0659  NA 131* 137 139 135 138 139  K 2.1* 3.2* 3.3* 3.7 3.3* 3.9  CL 96* 114* 100* 101 98*  --   CO2 20* 17* 30 25 33*  --  GLUCOSE 138* 99 94 104* 112* 99  BUN 74* 48* 18 17 21*  --   CREATININE 1.72* 0.94 0.65 0.63 0.65  --   CALCIUM 9.3 8.1* 9.3 8.2* 9.7  --   MG 0.7* 2.1  --   --   --   --    Recent Labs    01/22/17 1221 04/04/17 0545 12/10/17 1038  AST 36 19 22  ALT _0 ALKPHOS 74 55 53  BILITOT 0.6 0.5 0.4  PROT 8.1 5.9* 7.0  ALBUMIN 3.6 2.8* 3.5   Recent Labs    01/22/17 1221  04/01/17 1719 04/04/17 0545 12/10/17 1038 12/23/17 0659  WBC 12.5*   < > 7.9 5.3 8.6  --   NEUTROABS 8.2*  --   --  2.9 6.4  --   HGB 12.9   < > 11.3* 10.4* 11.8* 10.2*  HCT 36.9   < > 34.2* 30.9* 35.2 30.0*  MCV 83.3   < > 85.2 84.5 84.5  --   PLT 618*   < > 407 378 361  --    < > = values in this interval not displayed.   No results found for: TSH No results found for: HGBA1C No results found for: CHOL, HDL, LDLCALC, LDLDIRECT, TRIG, CHOLHDL  Significant Diagnostic Results in last 30 days:  Dg Hip Port Unilat With Pelvis 1v Left  Result Date: 12/23/2017 CLINICAL DATA:  Postop arthroplasty. EXAM: DG HIP (WITH OR WITHOUT PELVIS) 1V PORT LEFT COMPARISON:  CT pelvis 04/01/2017. FINDINGS: Two AP pelvis and a cross-table lateral radiograph of the left hip are submitted. Interval left total hip arthroplasty. The hardware appears well positioned. The left periarticular soft tissues are incompletely visualized. Surgical drain and skin staples are in place. No evidence of acute fracture or dislocation. Previous right hip arthroplasty appears stable.  IMPRESSION: Interval left hip arthroplasty without demonstrated complication. Electronically Signed   By: Richardean Sale M.D.   On: 12/23/2017 10:46    Assessment/Plan  Avascular necrosis of bone of hip, left (HCC)  Status post total replacement of left hip   Continue PT/OT  Continue exercises as taught by PT/OT  Continue Tylenol 500 mg po Q 8 hours prn pain  Continue Tramadol 50 mg 1-2 tablets po Q 4 hours prn  Continue Lovenox 30 mg SQ BID for DVT prophylaxis  Skin care per protocol  Ice pack QID and prn for edema  Imodium 2 mg po prn for excessive diarrhea  Follow up with GI MD as instructed  Follow up with Orthopedist as instructed  Family/ staff Communication:   Total Time:  Documentation:  Face to Face:  Family/Phone:   Labs/tests ordered: cbc, met c next week    Medication list reviewed and assessed for continued appropriateness. Monthly medication orders reviewed and signed.  Vikki Ports, NP-C [facility] [phone], [place] [address] Cell Phone (Mon-Fri 8am-5pm):  [phone] On Call:  [phone] & follow prompts after 5pm & weekends Office Phone:  [phone] Office Fax:  [phone]

## 2018-01-06 DIAGNOSIS — M87052 Idiopathic aseptic necrosis of left femur: Secondary | ICD-10-CM | POA: Insufficient documentation

## 2018-01-07 ENCOUNTER — Other Ambulatory Visit
Admission: RE | Admit: 2018-01-07 | Discharge: 2018-01-07 | Disposition: A | Discharge status: Home / Self Care | Payer: Medicare Other | Source: Ambulatory Visit | Attending: Gerontology | Admitting: Gerontology

## 2018-01-07 DIAGNOSIS — Z471 Aftercare following joint replacement surgery: Secondary | ICD-10-CM | POA: Insufficient documentation

## 2018-01-07 LAB — COMPREHENSIVE METABOLIC PANEL
ALK PHOS: 62 U/L (ref 38–126)
ALT: 11 U/L — AB (ref 14–54)
AST: 19 U/L (ref 15–41)
Albumin: 2.8 g/dL — ABNORMAL LOW (ref 3.5–5.0)
Anion gap: 8 (ref 5–15)
BILIRUBIN TOTAL: 0.3 mg/dL (ref 0.3–1.2)
BUN: 11 mg/dL (ref 6–20)
CHLORIDE: 101 mmol/L (ref 101–111)
CO2: 28 mmol/L (ref 22–32)
CREATININE: 0.58 mg/dL (ref 0.44–1.00)
Calcium: 8.7 mg/dL — ABNORMAL LOW (ref 8.9–10.3)
GFR calc Af Amer: >60 mL/min (ref >60)
Glucose, Bld: 99 mg/dL (ref 65–99)
Potassium: 3.4 mmol/L — ABNORMAL LOW (ref 3.5–5.1)
Sodium: 137 mmol/L (ref 135–145)
Total Protein: 6.2 g/dL — ABNORMAL LOW (ref 6.5–8.1)

## 2018-01-07 LAB — CBC WITH DIFFERENTIAL/PLATELET
BASOS ABS: 0 10*3/uL (ref 0–0.1)
BLASTS: 0 %
Band Neutrophils: 0 %
Basophils Relative: 0 %
Eosinophils Absolute: 0.2 10*3/uL (ref 0–0.7)
Eosinophils Relative: 2 %
HEMATOCRIT: 30 % — AB (ref 35.0–47.0)
HEMOGLOBIN: 9.9 g/dL — AB (ref 12.0–16.0)
Lymphocytes Relative: 30 %
Lymphs Abs: 2.8 10*3/uL (ref 1.0–3.6)
MCH: 28 pg (ref 26.0–34.0)
MCHC: 33.2 g/dL (ref 32.0–36.0)
MCV: 84.5 fL (ref 80.0–100.0)
METAMYELOCYTES PCT: 1 %
MYELOCYTES: 0 %
Monocytes Absolute: 0.8 10*3/uL (ref 0.2–0.9)
Monocytes Relative: 9 %
NEUTROS PCT: 58 %
Neutro Abs: 5.4 10*3/uL (ref 1.4–6.5)
Other: 0 %
PLATELETS: 619 10*3/uL — AB (ref 150–440)
PROMYELOCYTES RELATIVE: 0 %
RBC: 3.55 MIL/uL — ABNORMAL LOW (ref 3.80–5.20)
RDW: 16.3 % — ABNORMAL HIGH (ref 11.5–14.5)
WBC: 9.2 10*3/uL (ref 3.6–11.0)
nRBC: 0 /100 WBC

## 2018-01-10 ENCOUNTER — Other Ambulatory Visit: Payer: Self-pay

## 2018-01-10 MED ORDER — OXYCODONE HCL 5 MG PO TABS: 5.0000 mg | ORAL_TABLET | ORAL | 0 refills | Status: DC | PRN

## 2018-01-10 NOTE — Telephone Encounter (Signed)
Rx sent to [facility] phone : [phone] , fax : [phone]

## 2018-02-23 ENCOUNTER — Emergency Department
Admission: EM | Admit: 2018-02-23 | Discharge: 2018-02-23 | Disposition: A | Discharge status: Home / Self Care | Payer: Medicare Other | Attending: Emergency Medicine | Admitting: Emergency Medicine

## 2018-02-23 ENCOUNTER — Emergency Department: Payer: Medicare Other

## 2018-02-23 ENCOUNTER — Other Ambulatory Visit: Payer: Self-pay

## 2018-02-23 DIAGNOSIS — Y9301 Activity, walking, marching and hiking: Secondary | ICD-10-CM | POA: Diagnosis not present

## 2018-02-23 DIAGNOSIS — I10 Essential (primary) hypertension: Secondary | ICD-10-CM | POA: Insufficient documentation

## 2018-02-23 DIAGNOSIS — Z79899 Other long term (current) drug therapy: Secondary | ICD-10-CM | POA: Diagnosis not present

## 2018-02-23 DIAGNOSIS — Z87891 Personal history of nicotine dependence: Secondary | ICD-10-CM | POA: Diagnosis not present

## 2018-02-23 DIAGNOSIS — W108XXA Fall (on) (from) other stairs and steps, initial encounter: Secondary | ICD-10-CM | POA: Diagnosis not present

## 2018-02-23 DIAGNOSIS — M25552 Pain in left hip: Secondary | ICD-10-CM | POA: Diagnosis not present

## 2018-02-23 DIAGNOSIS — M25551 Pain in right hip: Secondary | ICD-10-CM | POA: Insufficient documentation

## 2018-02-23 DIAGNOSIS — Z96642 Presence of left artificial hip joint: Secondary | ICD-10-CM | POA: Diagnosis not present

## 2018-02-23 DIAGNOSIS — M545 Low back pain: Secondary | ICD-10-CM | POA: Diagnosis not present

## 2018-02-23 DIAGNOSIS — Y929 Unspecified place or not applicable: Secondary | ICD-10-CM | POA: Diagnosis not present

## 2018-02-23 DIAGNOSIS — S0990XA Unspecified injury of head, initial encounter: Secondary | ICD-10-CM | POA: Diagnosis present

## 2018-02-23 DIAGNOSIS — Y999 Unspecified external cause status: Secondary | ICD-10-CM | POA: Diagnosis not present

## 2018-02-23 DIAGNOSIS — W19XXXA Unspecified fall, initial encounter: Secondary | ICD-10-CM

## 2018-02-23 MED ORDER — TRAMADOL HCL 50 MG PO TABS: 50.0000 mg | ORAL_TABLET | Freq: Four times a day (QID) | ORAL | 0 refills | Status: AC | PRN

## 2018-02-23 MED ORDER — TRAMADOL HCL 50 MG PO TABS
50.0000 mg | ORAL_TABLET | Freq: Once | ORAL | Status: AC
  Administered 2018-02-23: 50 mg via ORAL
  Filled 2018-02-23: qty 1

## 2018-02-23 NOTE — ED Notes (Signed)
Pt brought back to room 54 in w/c. xr's are negative

## 2018-02-23 NOTE — ED Triage Notes (Signed)
Patient reports going up steps and thinks did not step high enough and fell backward.  Did hit back of head, denies loss of consciousness (takes baby ASA).  Patient reports lower back pain and "tail bone" pain.

## 2018-02-23 NOTE — ED Provider Notes (Signed)
[facility] Emergency Department Provider Note  ____________________________________________  Time seen: Approximately 10:50 PM  I have reviewed the triage vital signs and the nursing notes.   HISTORY  Chief Complaint Fall and Hip Pain    HPI Joanne [name] is a 84 y.o. female presents to the emergency department after a fall that occurred tonight.  Patient went to take a step onto the deck and lost her balance and fell backwards.  Patient did hit the back of her head on the right side.  She did not lose consciousness.  She has not experienced blurry vision, nausea or vomiting.  Patient is accompanied by her daughter who denies disorientation.  Patient reports that she has had some discomfort in her hips and has had bilateral total hip arthroplasty in the past.  She secondarily reports 6 out of 10 low back pain.  She denies weakness, radiculopathy or changes in sensation of the lower extremities.  She has not ambulated since the incident.  Patient has taken tramadol in the past for severe pain.  No alleviating measures were attempted prior to presenting to the emergency department.   Past Medical History:  Diagnosis Date  . Anemia, unspecified    B12 and iron deficiency   . Arthritis   . Crohn disease ([place])   . Crohn's disease ([place])    followed by Dr. Vira Agar with serial colonoscopies  . Fuchs' endothelial dystrophy   . GERD (gastroesophageal reflux disease)   . History of bone density study 09/01/2012  . Hyperlipidemia, unspecified   . Hypertension   . Hypokalemia   . Hypomagnesemia   . Osteoporosis   . Other dysphagia 01/24/2017  . Pneumonia    1980's  . Retinal detachment   . Right shoulder injury 05/2002   treated conservatively  . Trigger finger   . Zenker diverticulum 09/25/2016    Patient Active Problem List   Diagnosis Date Noted  . Avascular necrosis of bone of hip, left ([place]) 01/06/2018  . Anemia, unspecified 12/23/2017  . Crohn's  disease ([place]) 12/23/2017  . Fuchs' corneal dystrophy 12/23/2017  . Hyperlipidemia, unspecified 12/23/2017  . Status post total replacement of hip 12/23/2017  . Benign essential HTN 12/16/2017  . Dyspnea on exertion 12/02/2017  . Protein-calorie malnutrition ([place]) 04/21/2017  . Age-related osteoporosis with current pathological fracture with routine healing 04/03/2017  . Other dysphagia 01/24/2017  . Hypokalemia 01/22/2017  . Zenker diverticulum 09/25/2016  . History of hypertension 05/03/2015  . Hyponatremia 02/21/2015  . Infected sebaceous cyst 11/10/2013  . Nuclear sclerotic cataract of right eye 09/08/2012  . Right shoulder injury 05/27/2002    Past Surgical History:  Procedure Laterality Date  . APPENDECTOMY  1949  . CATARACT EXTRACTION EXTRACAPSULAR Right 05/10/2015   Procedure: EXTRACTION CATARACT EXTRACAPSULAR W/INSERTION INTRAOCULAR PROSTHESIS; Surgeon: Doyce Para, MD; [facility]; Service: Ophthalmology; Laterality: Right;  . CHOLECYSTECTOMY  1992  . COLONOSCOPY     03/24/1992, 03/11/1998, 07/08/2002, 05/08/2006, 01/31/2012   PH Crohns disease; no repeat due to age per RTE (dw)  . CORNEAL TRANSPLANT Left 07/08/2012   Procedure: CORNEAL TRANSPLANT DSAEK ** tissue ordered OSI 06/27/2012; Surgeon: Doyce Para, MD; Location: [place]; Service: Ophthalmology;  . ESOPHAGOGASTRODUODENOSCOPY     05/01/2005, 01/31/2012 ; No repeat per RTE  . EYE SURGERY  2012, 2013   5 total  . JOINT REPLACEMENT Right 01-28-13  . LENSECTOMY PHACOFRAGMENTATION WITH ASPIRATION Left 07/25/2011  . TONSILLECTOMY  1950  . TOTAL HIP ARTHROPLASTY Left 12/23/2017  Procedure: TOTAL HIP ARTHROPLASTY;  Surgeon: Dereck Leep, MD;  Location: [facility] OR[facility];  Service: Orthopedics;  Laterality: Left;  Marland Kitchen VITREOUS RETINAL SURGERY Left 11/07/2011   PPV/SO  . VITREOUS RETINAL SURGERY Left 04/18/2011   TPPV/TRP/SO/SB/EL/C3F8    Prior to Admission medications   Medication Sig Start Date End Date Taking?  Authorizing Provider  acetaminophen (TYLENOL) 500 MG tablet Take 500 mg by mouth every 8 (eight) hours as needed for mild pain or moderate pain.     [provider]  Amino Acids-Protein Hydrolys (FEEDING SUPPLEMENT, PRO-STAT SUGAR FREE 64,) LIQD Take 30 mLs by mouth 2 (two) times daily.    [provider]  aspirin EC 325 MG tablet Take 325 mg by mouth daily.     [provider]  budesonide (ENTOCORT EC) 3 MG 24 hr capsule Take 9 mg by mouth daily.     [provider]  Calcium Carb-Cholecalciferol (CALCIUM 600 + D PO) Take 1 tablet by mouth 3 (three) times daily.     [provider]  cholecalciferol (VITAMIN D) 1000 units tablet Take 1,000 Units by mouth daily.     [provider]  Coenzyme Q10 (COQ10) 100 MG CAPS Take 2 capsules by mouth daily.    [provider]  gabapentin (NEURONTIN) 100 MG capsule Take 1 capsule by mouth 2 (two) times daily. In AM and at noon 12/10/17   [provider]  gabapentin (NEURONTIN) 300 MG capsule Take 300 mg by mouth at bedtime.    [provider]  iron polysaccharides (NIFEREX) 150 MG capsule Take 150 mg by mouth daily.    [provider]  loperamide (LOPERAMIDE A-D) 2 MG tablet Take 2 mg by mouth 4 (four) times daily as needed for diarrhea or loose stools.    [provider]  Magnesium Oxide 250 MG TABS Take 500 mg by mouth daily.    [provider]  Multiple Vitamin (MULTIVITAMIN) tablet Take 1 tablet by mouth daily.    [provider]  NEXIUM 40 MG capsule Take 40 mg by mouth daily.  08/26/13   [provider]  Nutritional Supplements (ENSURE ENLIVE PO) Take 1 Bottle by mouth 2 (two) times daily.    [provider]  potassium chloride SA (K-DUR,KLOR-CON) 20 MEQ tablet Take 20 mEq by mouth 2 (two) times daily.    [provider]  prednisoLONE acetate (PRED FORTE) 1 % ophthalmic suspension Place 1 drop into both eyes at  bedtime.    [provider]  Probiotic Product (RISA-BID PROBIOTIC) TABS Take 1 tablet by mouth daily.    [provider]  torsemide (DEMADEX) 5 MG tablet Take 5 mg by mouth daily as needed.     [provider]  traMADol (ULTRAM) 50 MG tablet Take 1 tablet (50 mg total) by mouth every 6 (six) hours as needed for up to 3 days. 02/23/18 02/26/18  Lannie Fields, PA-C  vitamin B-12 (CYANOCOBALAMIN) 1000 MCG tablet Take 1,000 mcg by mouth daily.    [provider]  vitamin C (ASCORBIC ACID) 500 MG tablet Take 500 mg by mouth daily.    [provider]    Allergies Oxycodone  Family History  Problem Relation Age of Onset  . Hypertension Mother   . Stroke Father   . Hypertension Father   . Arthritis Other   . Prostate cancer Other   . Psoriasis Other   . Stroke Other   . Thyroid disease Other   .  Anesthesia problems Neg Hx   . Diabetes Neg Hx   . Glaucoma Neg Hx   . Macular degeneration Neg Hx     Social History Social History   Tobacco Use  . Smoking status: Former Smoker    Packs/day: 0.50    Years: 35.00    Pack years: 17.50    Types: Cigarettes    Last attempt to quit: 01/29/1991    Years since quitting: 27.0  . Smokeless tobacco: Never Used  Substance Use Topics  . Alcohol use: Yes    Comment: 3x/yr  . Drug use: No     Review of Systems  Constitutional: No fever/chills Eyes: No visual changes. No discharge ENT: No upper respiratory complaints. Cardiovascular: no chest pain. Respiratory: no cough. No SOB. Gastrointestinal: No abdominal pain.  No nausea, no vomiting.  No diarrhea.  No constipation. Genitourinary: Negative for dysuria. No hematuria Musculoskeletal: Patient has a low back pain. Skin: Negative for rash, abrasions, lacerations, ecchymosis. Neurological: Patient has headache, focal weakness or numbness.   ____________________________________________   PHYSICAL EXAM:  VITAL SIGNS: ED Triage Vitals  [02/23/18 2110]  Enc Vitals Group     BP (!) 174/73     Pulse Rate 92     Resp 18     Temp 98.2 F (36.8 C)     Temp Source Oral     SpO2 98 %     Weight 159 lb (72.1 kg)     Height 5' (1.524 m)     Head Circumference      Peak Flow      Pain Score 8     Pain Loc      Pain Edu?      Excl. in [place]?      Constitutional: Alert and oriented. Well appearing and in no acute distress. Eyes: Conjunctivae are normal.  Patient's right pupil is reactive.  Patient's left pupil is not reactive.  Patient reports this is a chronic issue and she has had at least 6 surgeries on her left eye.  EOMI. Head: Atraumatic. ENT:      Ears: TMs are pearly.      Nose: No congestion/rhinnorhea.      Mouth/Throat: Mucous membranes are moist.  Neck: No stridor.  No cervical spine tenderness to palpation. Cardiovascular: Normal rate, regular rhythm. Normal S1 and S2.  Good peripheral circulation. Respiratory: Normal respiratory effort without tachypnea or retractions. Lungs CTAB. Good air entry to the bases with no decreased or absent breath sounds. Gastrointestinal: Bowel sounds 4 quadrants. Soft and nontender to palpation. No guarding or rigidity. No palpable masses. No distention. No CVA tenderness. Musculoskeletal: Full range of motion to all extremities. No gross deformities appreciated.  Patient has no pain with internal and external rotation of the bilateral hips.  Paraspinal muscle tenderness is elicited with palpation over the lumbar spine and upper buttocks.  Negative straight leg raise bilaterally. Neurologic:  Normal speech and language. No gross focal neurologic deficits are appreciated.  Skin:  Skin is warm, dry and intact. No rash noted. Psychiatric: Mood and affect are normal. Speech and behavior are normal. Patient exhibits appropriate insight and judgement.   ____________________________________________   LABS (all labs ordered are listed, but only abnormal results are displayed)  Labs  Reviewed - No data to display ____________________________________________  EKG   ____________________________________________  RADIOLOGY I personally viewed and evaluated these images as part of my medical decision making, as well as reviewing the written report by the radiologist.  Dg Lumbar Spine 2-3 Views  Result Date: 02/23/2018 CLINICAL DATA:  Low back pain after fall. EXAM: LUMBAR SPINE - 2-3 VIEW COMPARISON:  MR lumbar spine dated December 02, 2012. FINDINGS: Five lumbar type vertebral bodies. No acute fracture or subluxation. Vertebral body heights are preserved. Trace retrolisthesis at L2-L3, unchanged. Mild disc height loss and endplate spurring at J8-S5 and L2-L3, similar to prior study. Moderate lower lumbar facet arthropathy. The sacroiliac joints are intact. Aortoiliac atherosclerotic vascular disease. IMPRESSION: 1.  No acute osseous abnormality. 2. Mild degenerative changes of the upper lumbar spine, similar to prior study. Electronically Signed   By: Titus Dubin M.D.   On: 02/23/2018 21:47   Dg Pelvis 1-2 Views  Result Date: 02/23/2018 CLINICAL DATA:  Fall downstairs with pelvic pain, initial encounter EXAM: PELVIS - 1-2 VIEW COMPARISON:  None. FINDINGS: Pelvic ring is intact. Bilateral hip replacements are seen. No acute fracture or dislocation is noted. No soft tissue changes are seen. IMPRESSION: No acute abnormality noted. Electronically Signed   By: Inez Catalina M.D.   On: 02/23/2018 21:46   Ct Head Wo Contrast  Result Date: 02/23/2018 CLINICAL DATA:  Fall with head trauma EXAM: CT HEAD WITHOUT CONTRAST TECHNIQUE: Contiguous axial images were obtained from the base of the skull through the vertex without intravenous contrast. COMPARISON:  Head CT 04/01/2017 FINDINGS: Brain: There is no mass, hemorrhage or extra-axial collection. There is generalized atrophy without lobar predilection. There is no acute or chronic infarction. There is hypoattenuation of the periventricular  white matter, most commonly indicating chronic ischemic microangiopathy. Vascular: No abnormal hyperdensity of the major intracranial arteries or dural venous sinuses. No intracranial atherosclerosis. Skull: The visualized skull base, calvarium and extracranial soft tissues are normal. Sinuses/Orbits: No fluid levels or advanced mucosal thickening of the visualized paranasal sinuses. No mastoid or middle ear effusion. The orbits are normal. IMPRESSION: Atrophy and chronic small vessel ischemia without acute intracranial abnormality. Electronically Signed   By: Ulyses Jarred M.D.   On: 02/23/2018 21:54    ____________________________________________    PROCEDURES  Procedure(s) performed:    Procedures    Medications  traMADol (ULTRAM) tablet 50 mg (has no administration in time range)     ____________________________________________   INITIAL IMPRESSION / ASSESSMENT AND PLAN / ED COURSE  Pertinent labs & imaging results that were available during my care of the patient were reviewed by me and considered in my medical decision making (see chart for details).  Review of the [place] CSRS was performed in accordance of the [place] prior to dispensing any controlled drugs.      Assessment and plan Fall Patient presents to the emergency department after a fall that occurred earlier this evening.  Patient hit her head while trying to climb a step.  CT head revealed no acute abnormality.  Patient secondarily reported low back pain and bilateral hip discomfot.  X-ray examination of the lumbar spine and pelvis were reassuring without acute fracture or disruption of prosthesis.  Patient was discharged with tramadol and advised to follow-up with Dr. Marry Guan.  Vital signs are reassuring prior to discharge.  All patient questions were answered.    ____________________________________________  FINAL CLINICAL IMPRESSION(S) / ED DIAGNOSES  Final diagnoses:  Fall, initial encounter      NEW  MEDICATIONS STARTED DURING THIS VISIT:  ED Discharge Orders        Ordered    traMADol (ULTRAM) 50 MG tablet  Every 6 hours PRN     02/23/18 2248  This chart was dictated using voice recognition software/Dragon. Despite best efforts to proofread, errors can occur which can change the meaning. Any change was purely unintentional.    Lannie Fields, PA-C 02/23/18 [place], [place], MD 02/24/18 2253

## 2018-03-11 ENCOUNTER — Other Ambulatory Visit
Admission: RE | Admit: 2018-03-11 | Discharge: 2018-03-11 | Disposition: A | Discharge status: Home / Self Care | Payer: Medicare Other | Source: Ambulatory Visit | Attending: Nurse Practitioner | Admitting: Nurse Practitioner

## 2018-03-11 DIAGNOSIS — K5080 Crohn's disease of both small and large intestine without complications: Secondary | ICD-10-CM | POA: Insufficient documentation

## 2018-03-11 DIAGNOSIS — R197 Diarrhea, unspecified: Secondary | ICD-10-CM | POA: Diagnosis present

## 2018-03-11 LAB — GASTROINTESTINAL PANEL BY PCR, STOOL (REPLACES STOOL CULTURE)

## 2018-03-11 LAB — C DIFFICILE QUICK SCREEN W PCR REFLEX
C Diff antigen: NEGATIVE
C Diff interpretation: NOT DETECTED
C Diff toxin: NEGATIVE

## 2018-03-13 LAB — CALPROTECTIN, FECAL: Calprotectin, Fecal: 270 ug/g — ABNORMAL HIGH (ref 0–120)

## 2018-03-26 ENCOUNTER — Other Ambulatory Visit: Payer: Self-pay | Admitting: Nurse Practitioner

## 2018-03-26 DIAGNOSIS — Z8719 Personal history of other diseases of the digestive system: Secondary | ICD-10-CM

## 2018-03-26 DIAGNOSIS — Z9889 Other specified postprocedural states: Principal | ICD-10-CM

## 2018-04-01 ENCOUNTER — Ambulatory Visit: Payer: Medicare Other

## 2018-04-01 ENCOUNTER — Other Ambulatory Visit: Payer: Medicare Other

## 2018-05-13 ENCOUNTER — Other Ambulatory Visit: Payer: Self-pay | Admitting: Nurse Practitioner

## 2018-05-13 DIAGNOSIS — R109 Unspecified abdominal pain: Secondary | ICD-10-CM

## 2018-05-13 DIAGNOSIS — K5080 Crohn's disease of both small and large intestine without complications: Secondary | ICD-10-CM

## 2018-05-14 ENCOUNTER — Ambulatory Visit
Admission: RE | Admit: 2018-05-14 | Discharge: 2018-05-14 | Disposition: A | Discharge status: Home / Self Care | Payer: Medicare Other | Source: Ambulatory Visit | Attending: Nurse Practitioner | Admitting: Nurse Practitioner

## 2018-05-14 DIAGNOSIS — R109 Unspecified abdominal pain: Secondary | ICD-10-CM | POA: Diagnosis present

## 2018-05-14 DIAGNOSIS — K5080 Crohn's disease of both small and large intestine without complications: Secondary | ICD-10-CM | POA: Insufficient documentation

## 2018-05-14 HISTORY — DX: Systemic involvement of connective tissue, unspecified: M35.9

## 2018-05-14 MED ORDER — IOHEXOL 300 MG/ML  SOLN
100.0000 mL | Freq: Once | INTRAMUSCULAR | Status: AC | PRN
  Administered 2018-05-14: 100 mL via INTRAVENOUS

## 2018-05-30 ENCOUNTER — Other Ambulatory Visit: Payer: Self-pay

## 2018-05-30 ENCOUNTER — Ambulatory Visit
Admission: RE | Admit: 2018-05-30 | Discharge: 2018-05-30 | Disposition: A | Discharge status: Home / Self Care | Payer: Medicare Other | Source: Ambulatory Visit | Attending: Unknown Physician Specialty | Admitting: Unknown Physician Specialty

## 2018-05-30 ENCOUNTER — Ambulatory Visit: Payer: Medicare Other | Admitting: Anesthesiology

## 2018-05-30 ENCOUNTER — Encounter
Admission: RE | Disposition: A | Discharge status: Home / Self Care | Payer: Self-pay | Source: Ambulatory Visit | Attending: Unknown Physician Specialty

## 2018-05-30 DIAGNOSIS — Z79899 Other long term (current) drug therapy: Secondary | ICD-10-CM | POA: Diagnosis not present

## 2018-05-30 DIAGNOSIS — K317 Polyp of stomach and duodenum: Secondary | ICD-10-CM | POA: Insufficient documentation

## 2018-05-30 DIAGNOSIS — E785 Hyperlipidemia, unspecified: Secondary | ICD-10-CM | POA: Diagnosis not present

## 2018-05-30 DIAGNOSIS — K225 Diverticulum of esophagus, acquired: Secondary | ICD-10-CM | POA: Insufficient documentation

## 2018-05-30 DIAGNOSIS — K219 Gastro-esophageal reflux disease without esophagitis: Secondary | ICD-10-CM | POA: Insufficient documentation

## 2018-05-30 DIAGNOSIS — Z7982 Long term (current) use of aspirin: Secondary | ICD-10-CM | POA: Diagnosis not present

## 2018-05-30 DIAGNOSIS — I10 Essential (primary) hypertension: Secondary | ICD-10-CM | POA: Insufficient documentation

## 2018-05-30 DIAGNOSIS — Z87891 Personal history of nicotine dependence: Secondary | ICD-10-CM | POA: Insufficient documentation

## 2018-05-30 DIAGNOSIS — K2950 Unspecified chronic gastritis without bleeding: Secondary | ICD-10-CM | POA: Diagnosis not present

## 2018-05-30 DIAGNOSIS — K5090 Crohn's disease, unspecified, without complications: Secondary | ICD-10-CM | POA: Diagnosis not present

## 2018-05-30 HISTORY — PX: ESOPHAGOGASTRODUODENOSCOPY (EGD) WITH PROPOFOL: SHX5813

## 2018-05-30 HISTORY — DX: Personal history of (healed) traumatic fracture: Z87.81

## 2018-05-30 HISTORY — DX: Unspecified protein-calorie malnutrition: E46

## 2018-05-30 HISTORY — DX: Dyspnea, unspecified: R06.00

## 2018-05-30 HISTORY — DX: Deficiency of other specified B group vitamins: E53.8

## 2018-05-30 SURGERY — ESOPHAGOGASTRODUODENOSCOPY (EGD) WITH PROPOFOL
Anesthesia: General

## 2018-05-30 MED ORDER — PIPERACILLIN-TAZOBACTAM 3.375 G IVPB
3.3750 g | Freq: Once | INTRAVENOUS | Status: AC
Start: 2018-05-30 — End: 2018-05-30
  Administered 2018-05-30: 3.375 g via INTRAVENOUS

## 2018-05-30 MED ORDER — FENTANYL CITRATE (PF) 100 MCG/2ML IJ SOLN
INTRAMUSCULAR | Status: AC
  Filled 2018-05-30: qty 2

## 2018-05-30 MED ORDER — GLYCOPYRROLATE 0.2 MG/ML IJ SOLN
INTRAMUSCULAR | Status: AC
  Filled 2018-05-30: qty 1

## 2018-05-30 MED ORDER — SODIUM CHLORIDE 0.9 % IV SOLN: INTRAVENOUS | Status: DC

## 2018-05-30 MED ORDER — SODIUM CHLORIDE 0.9 % IV SOLN
INTRAVENOUS | Status: DC
  Administered 2018-05-30: 08:00:00 via INTRAVENOUS

## 2018-05-30 MED ORDER — PROPOFOL 10 MG/ML IV BOLUS
INTRAVENOUS | Status: DC | PRN
  Administered 2018-05-30: 20 mg via INTRAVENOUS
  Administered 2018-05-30: 30 mg via INTRAVENOUS

## 2018-05-30 MED ORDER — FENTANYL CITRATE (PF) 100 MCG/2ML IJ SOLN
INTRAMUSCULAR | Status: DC | PRN
  Administered 2018-05-30: 25 ug via INTRAVENOUS

## 2018-05-30 MED ORDER — PROPOFOL 500 MG/50ML IV EMUL
INTRAVENOUS | Status: DC | PRN
  Administered 2018-05-30: 25 ug/kg/min via INTRAVENOUS

## 2018-05-30 MED ORDER — MIDAZOLAM HCL 5 MG/5ML IJ SOLN
INTRAMUSCULAR | Status: DC | PRN
  Administered 2018-05-30 (×2): 0.5 mg via INTRAVENOUS

## 2018-05-30 MED ORDER — PIPERACILLIN-TAZOBACTAM 3.375 G IVPB
INTRAVENOUS | Status: AC
  Administered 2018-05-30: 3.375 g via INTRAVENOUS
  Filled 2018-05-30: qty 50

## 2018-05-30 MED ORDER — MIDAZOLAM HCL 2 MG/2ML IJ SOLN
INTRAMUSCULAR | Status: AC
  Filled 2018-05-30: qty 2

## 2018-05-30 MED ORDER — PROPOFOL 500 MG/50ML IV EMUL
INTRAVENOUS | Status: AC
  Filled 2018-05-30: qty 50

## 2018-05-30 MED ORDER — GLYCOPYRROLATE 0.2 MG/ML IJ SOLN
INTRAMUSCULAR | Status: DC | PRN
  Administered 2018-05-30: 0.2 mg via INTRAVENOUS

## 2018-05-30 MED ORDER — LIDOCAINE HCL (PF) 2 % IJ SOLN
INTRAMUSCULAR | Status: AC
  Filled 2018-05-30: qty 10

## 2018-05-30 MED ORDER — LIDOCAINE HCL (PF) 2 % IJ SOLN
INTRAMUSCULAR | Status: DC | PRN
  Administered 2018-05-30: 60 mg

## 2018-05-30 NOTE — Anesthesia Post-op Follow-up Note (Signed)
Anesthesia QCDR form completed.        

## 2018-05-30 NOTE — Anesthesia Preprocedure Evaluation (Signed)
Anesthesia Evaluation  Patient identified by MRN, date of birth, ID band Patient awake    Reviewed: Allergy & Precautions, NPO status , Patient's Chart, lab work & pertinent test results  History of Anesthesia Complications Negative for: history of anesthetic complications  Airway Mallampati: II  TM Distance: >3 FB     Dental  (+) Teeth Intact, Dental Advidsory Given   Pulmonary neg shortness of breath, pneumonia, resolved, neg COPD, neg recent URI, former smoker,           Cardiovascular Exercise Tolerance: Good hypertension, (-) angina(-) CAD, (-) Past MI, (-) Cardiac Stents and (-) CABG (-) dysrhythmias (-) Valvular Problems/Murmurs     Neuro/Psych negative neurological ROS  negative psych ROS   GI/Hepatic Neg liver ROS, GERD  ,  Endo/Other  negative endocrine ROS  Renal/GU negative Renal ROS  negative genitourinary   Musculoskeletal  (+) Arthritis , Osteoarthritis,    Abdominal Normal abdominal exam  (+)   Peds negative pediatric ROS (+)  Hematology  (+) anemia ,   Anesthesia Other Findings Past Medical History: No date: Anemia, unspecified     Comment:  B12 and iron deficiency  No date: Arthritis No date: B12 deficiency No date: Collagen vascular disease (HCC) No date: Crohn disease ([place]) No date: Crohn's disease ([place])     Comment:  followed by Dr. Vira Agar with serial colonoscopies No date: Dyspnea No date: Vira Agar' endothelial dystrophy No date: GERD (gastroesophageal reflux disease) 09/01/2012: History of bone density study No date: History of spinal fracture No date: Hyperlipidemia, unspecified No date: Hypertension No date: Hypokalemia No date: Hypomagnesemia No date: Osteoporosis 01/24/2017: Other dysphagia No date: Pneumonia     Comment:  1980's No date: Protein calorie malnutrition (HCC) No date: Retinal detachment 05/2002: Right shoulder injury     Comment:  treated conservatively No  date: Trigger finger 09/25/2016: Zenker diverticulum   Reproductive/Obstetrics                             Anesthesia Physical  Anesthesia Plan  ASA: III  Anesthesia Plan: General   Post-op Pain Management:    Induction: Intravenous  PONV Risk Score and Plan: 3 and Propofol infusion and TIVA  Airway Management Planned: Nasal Cannula and Natural Airway  Additional Equipment:   Intra-op Plan:   Post-operative Plan:   Informed Consent:   Dental advisory given  Plan Discussed with: CRNA and Surgeon  Anesthesia Plan Comments:         Anesthesia Quick Evaluation

## 2018-05-30 NOTE — Op Note (Signed)
[facility] Gastroenterology Patient Name: Joanne [name] Procedure Date: 05/30/2018 8:16 AM MRN: [id number] Account #: [id number] Date of Birth: March 12, 1931 Admit Type: Outpatient Age: 84 Room: [facility] ENDO ROOM 1 Gender: Female Note Status: Finalized Procedure:            Upper GI endoscopy Indications:          Follow up Zenkers diverticulum. Providers:            Manya Silvas, MD Medicines:            Propofol per Anesthesia Complications:        No immediate complications. Procedure:            Pre-Anesthesia Assessment:                       - After reviewing the risks and benefits, the patient                        was deemed in satisfactory condition to undergo the                        procedure.                       After obtaining informed consent, the endoscope was                        passed under direct vision. Throughout the procedure,                        the patient's blood pressure, pulse, and oxygen                        saturations were monitored continuously. The Endoscope                        was introduced through the mouth, and advanced to the                        second part of duodenum. The upper GI endoscopy was                        accomplished without difficulty. The patient tolerated                        the procedure well. Findings:      Zenkers diverticulum Seen in hypopharynx, fairly large, nothing in the       pouch.      The examined esophagus was normal. GEJ 39cm.      Diffuse mildly erythematous mucosa without bleeding was found in the       gastric body and in the gastric antrum. Biopsies were taken with a cold       forceps for histology. Biopsies were taken with a cold forceps for       histology. For evaluation of possible causes of erythematous gastritis.      A single small sessile polyp with no bleeding and no stigmata of recent       bleeding was found in the gastric body. Biopsies were taken with a  cold       forceps for histology.      The  examined duodenum was normal. Impression:           - Normal esophagus.                       - Erythematous mucosa in the gastric body and antrum.                        Biopsied.                       - A single gastric polyp. Biopsied.                       - Normal examined duodenum. Recommendation:       - Await pathology results. Manya Silvas, MD 05/30/2018 8:49:08 AM This report has been signed electronically. Number of Addenda: 0 Note Initiated On: 05/30/2018 8:16 AM      [facility]

## 2018-05-30 NOTE — Transfer of Care (Signed)
Immediate Anesthesia Transfer of Care Note  Patient: Joanne [name]  Procedure(s) Performed: ESOPHAGOGASTRODUODENOSCOPY (EGD) WITH PROPOFOL (N/A )  Patient Location: PACU  Anesthesia Type:General  Level of Consciousness: sedated  Airway & Oxygen Therapy: Patient Spontanous Breathing and Patient connected to nasal cannula oxygen  Post-op Assessment: Report given to RN and Post -op Vital signs reviewed and stable  Post vital signs: Reviewed and stable  Last Vitals:  Vitals Value Taken Time  BP    Temp    Pulse    Resp    SpO2      Last Pain:  Vitals:   05/30/18 0749  TempSrc: Tympanic  PainSc: 0-No pain         Complications: No apparent anesthesia complications

## 2018-05-30 NOTE — H&P (Signed)
Primary Care Physician:  Tracie Harrier, MD Primary Gastroenterologist:  Dr. Vira Agar  Pre-Procedure History & Physical: HPI:  Joanne [name] is a 84 y.o. female is here for an endoscopy.  Done for abdominal pain.  Also evaluate her Zenkers diverticulum.   Past Medical History:  Diagnosis Date  . Anemia, unspecified    B12 and iron deficiency   . Arthritis   . B12 deficiency   . Collagen vascular disease ([place])   . Crohn disease ([place])   . Crohn's disease ([place])    followed by Dr. Vira Agar with serial colonoscopies  . Dyspnea   . Fuchs' endothelial dystrophy   . GERD (gastroesophageal reflux disease)   . History of bone density study 09/01/2012  . History of spinal fracture   . Hyperlipidemia, unspecified   . Hypertension   . Hypokalemia   . Hypomagnesemia   . Osteoporosis   . Other dysphagia 01/24/2017  . Pneumonia    1980's  . Protein calorie malnutrition ([place])   . Retinal detachment   . Right shoulder injury 05/2002   treated conservatively  . Trigger finger   . Zenker diverticulum 09/25/2016    Past Surgical History:  Procedure Laterality Date  . APPENDECTOMY  1949  . CATARACT EXTRACTION EXTRACAPSULAR Right 05/10/2015   Procedure: EXTRACTION CATARACT EXTRACAPSULAR W/INSERTION INTRAOCULAR PROSTHESIS; Surgeon: Doyce Para, MD; [facility]; Service: Ophthalmology; Laterality: Right;  . CHOLECYSTECTOMY  1992  . COLONOSCOPY     03/24/1992, 03/11/1998, 07/08/2002, 05/08/2006, 01/31/2012   PH Crohns disease; no repeat due to age per RTE (dw)  . CORNEAL TRANSPLANT Left 07/08/2012   Procedure: CORNEAL TRANSPLANT DSAEK ** tissue ordered OSI 06/27/2012; Surgeon: Doyce Para, MD; Location: [place]; Service: Ophthalmology;  . ESOPHAGOGASTRODUODENOSCOPY     05/01/2005, 01/31/2012 ; No repeat per RTE  . EYE SURGERY  2012, 2013   5 total  . JOINT REPLACEMENT Right 01-28-13  . LENSECTOMY PHACOFRAGMENTATION WITH ASPIRATION Left 07/25/2011  . TONSILLECTOMY  1950  . TOTAL HIP  ARTHROPLASTY Left 12/23/2017   Procedure: TOTAL HIP ARTHROPLASTY;  Surgeon: Dereck Leep, MD;  Location: [facility];  Service: Orthopedics;  Laterality: Left;  Marland Kitchen VITREOUS RETINAL SURGERY Left 11/07/2011   PPV/SO  . VITREOUS RETINAL SURGERY Left 04/18/2011   TPPV/TRP/SO/SB/EL/C3F8    Prior to Admission medications   Medication Sig Start Date End Date Taking? Authorizing Provider  acetaminophen (TYLENOL) 500 MG tablet Take 500 mg by mouth every 8 (eight) hours as needed for mild pain or moderate pain.    Yes [provider]  Amino Acids-Protein Hydrolys (FEEDING SUPPLEMENT, PRO-STAT SUGAR FREE 64,) LIQD Take 30 mLs by mouth 2 (two) times daily.   Yes [provider]  aspirin EC 325 MG tablet Take 325 mg by mouth daily.    Yes [provider]  budesonide (ENTOCORT EC) 3 MG 24 hr capsule Take 9 mg by mouth daily.    Yes [provider]  Calcium Carb-Cholecalciferol (CALCIUM 600 + D PO) Take 1 tablet by mouth 3 (three) times daily.    Yes [provider]  cholecalciferol (VITAMIN D) 1000 units tablet Take 1,000 Units by mouth daily.    Yes [provider]  Coenzyme Q10 (COQ10) 100 MG CAPS Take 2 capsules by mouth daily.   Yes [provider]  gabapentin (NEURONTIN) 100 MG capsule Take 1 capsule by mouth 2 (two) times daily. In AM and at noon 12/10/17  Yes [provider]  gabapentin (NEURONTIN) 300  MG capsule Take 300 mg by mouth at bedtime.   Yes [provider]  iron polysaccharides (NIFEREX) 150 MG capsule Take 150 mg by mouth daily.   Yes [provider]  loperamide (LOPERAMIDE A-D) 2 MG tablet Take 2 mg by mouth 4 (four) times daily as needed for diarrhea or loose stools.   Yes [provider]  Magnesium Oxide 250 MG TABS Take 500 mg by mouth daily.   Yes [provider]  Multiple Vitamin (MULTIVITAMIN) tablet Take 1 tablet by mouth daily.   Yes [provider]  NEXIUM 40 MG  capsule Take 40 mg by mouth daily.  08/26/13  Yes [provider]  Nutritional Supplements (ENSURE ENLIVE PO) Take 1 Bottle by mouth 2 (two) times daily.   Yes [provider]  potassium chloride SA (K-DUR,KLOR-CON) 20 MEQ tablet Take 20 mEq by mouth 2 (two) times daily.   Yes [provider]  prednisoLONE acetate (PRED FORTE) 1 % ophthalmic suspension Place 1 drop into both eyes at bedtime.   Yes [provider]  Probiotic Product (RISA-BID PROBIOTIC) TABS Take 1 tablet by mouth daily.   Yes [provider]  torsemide (DEMADEX) 5 MG tablet Take 5 mg by mouth daily as needed.    Yes [provider]  vitamin B-12 (CYANOCOBALAMIN) 1000 MCG tablet Take 1,000 mcg by mouth daily.   Yes [provider]  vitamin C (ASCORBIC ACID) 500 MG tablet Take 500 mg by mouth daily.   Yes [provider]    Allergies as of 05/19/2018 - Review Complete 05/14/2018  Allergen Reaction Noted  . Oxycodone Other (See Comments) 12/26/2017    Family History  Problem Relation Age of Onset  . Hypertension Mother   . Stroke Father   . Hypertension Father   . Arthritis Other   . Prostate cancer Other   . Psoriasis Other   . Stroke Other   . Thyroid disease Other   . Anesthesia problems Neg Hx   . Diabetes Neg Hx   . Glaucoma Neg Hx   . Macular degeneration Neg Hx     Social History   Socioeconomic History  . Marital status: Widowed    Spouse name: Not on file  . Number of children: 2  . Years of education: college  . Highest education level: Not on file  Occupational History  . Occupation: retired    Comment: Pharmacist, hospital  Social Needs  . Financial resource strain: Not on file  . Food insecurity:    Worry: Not on file    Inability: Not on file  . Transportation needs:    Medical: Not on file    Non-medical: Not on file  Tobacco Use  . Smoking status: Former Smoker    Packs/day: 0.50    Years: 35.00    Pack years: 17.50     Types: Cigarettes    Last attempt to quit: 01/29/1991    Years since quitting: 27.3  . Smokeless tobacco: Never Used  Substance and Sexual Activity  . Alcohol use: Yes    Comment: 3x/yr  . Drug use: No  . Sexual activity: Never  Lifestyle  . Physical activity:    Days per week: Not on file    Minutes per session: Not on file  . Stress: Not on file  Relationships  . Social connections:    Talks on phone: Not on file    Gets together: Not on file    Attends religious service:  Not on file    Active member of club or organization: Not on file    Attends meetings of clubs or organizations: Not on file    Relationship status: Not on file  . Intimate partner violence:    Fear of current or ex partner: Not on file    Emotionally abused: Not on file    Physically abused: Not on file    Forced sexual activity: Not on file  Other Topics Concern  . Not on file  Social History Narrative   Widowed   Former smoker   Occasional drink 3 x a year   2 children    Full Code    Review of Systems: See HPI, otherwise negative ROS  Physical Exam: BP 137/88   Pulse 82   Temp (!) 97.4 F (36.3 C) (Tympanic)   Resp 16   Ht 5' (1.524 m)   Wt 73 kg   SpO2 98%   BMI 31.44 kg/m  General:   Alert,  pleasant and cooperative in NAD Head:  Normocephalic and atraumatic. Neck:  Supple; no masses or thyromegaly. Lungs:  Clear throughout to auscultation.    Heart:  Regular rate and rhythm. Abdomen:  Soft, nontender and nondistended. Normal bowel sounds, without guarding, and without rebound.   Neurologic:  Alert and  oriented x4;  grossly normal neurologically.  Impression/Plan: Joanne [name] is here for an endoscopy to be performed for abdominal pain.  Also evaluate her Zenkers diverticulum.  Risks, benefits, limitations, and alternatives regarding  endoscopy have been reviewed with the patient.  Questions have been answered.  All parties agreeable.   Gaylyn Cheers, MD  05/30/2018, 8:21  AM

## 2018-05-31 ENCOUNTER — Encounter: Payer: Self-pay | Admitting: Unknown Physician Specialty

## 2018-06-01 NOTE — Anesthesia Postprocedure Evaluation (Signed)
Anesthesia Post Note  Patient: Joanne [name]  Procedure(s) Performed: ESOPHAGOGASTRODUODENOSCOPY (EGD) WITH PROPOFOL (N/A )  Patient location during evaluation: Endoscopy Anesthesia Type: General Level of consciousness: awake and alert Pain management: pain level controlled Vital Signs Assessment: post-procedure vital signs reviewed and stable Respiratory status: spontaneous breathing, nonlabored ventilation, respiratory function stable and patient connected to nasal cannula oxygen Cardiovascular status: blood pressure returned to baseline and stable Postop Assessment: no apparent nausea or vomiting Anesthetic complications: no     Last Vitals:  Vitals:   05/30/18 0906 05/30/18 0916  BP: 128/77 (!) 120/105  Pulse: 72 67  Resp: 13 19  Temp:    SpO2: 98% 100%    Last Pain:  Vitals:   05/31/18 0950  TempSrc:   PainSc: 0-No pain                 Martha Clan

## 2018-06-02 LAB — SURGICAL PATHOLOGY

## 2018-06-04 ENCOUNTER — Ambulatory Visit: Admit: 2018-06-04 | Payer: Medicare Other | Admitting: Unknown Physician Specialty

## 2018-06-04 SURGERY — COLONOSCOPY WITH PROPOFOL
Anesthesia: General

## 2018-07-31 ENCOUNTER — Emergency Department: Payer: Medicare Other

## 2018-07-31 ENCOUNTER — Other Ambulatory Visit: Payer: Self-pay

## 2018-07-31 ENCOUNTER — Emergency Department
Admission: EM | Admit: 2018-07-31 | Discharge: 2018-07-31 | Disposition: A | Discharge status: Home / Self Care | Payer: Medicare Other | Attending: Emergency Medicine | Admitting: Emergency Medicine

## 2018-07-31 DIAGNOSIS — Z79899 Other long term (current) drug therapy: Secondary | ICD-10-CM | POA: Insufficient documentation

## 2018-07-31 DIAGNOSIS — M79604 Pain in right leg: Secondary | ICD-10-CM

## 2018-07-31 DIAGNOSIS — M25561 Pain in right knee: Secondary | ICD-10-CM | POA: Diagnosis present

## 2018-07-31 DIAGNOSIS — Z87891 Personal history of nicotine dependence: Secondary | ICD-10-CM | POA: Diagnosis not present

## 2018-07-31 DIAGNOSIS — I10 Essential (primary) hypertension: Secondary | ICD-10-CM | POA: Diagnosis not present

## 2018-07-31 LAB — URINALYSIS, COMPLETE (UACMP) WITH MICROSCOPIC
Bacteria, UA: NONE SEEN
Bilirubin Urine: NEGATIVE
Glucose, UA: NEGATIVE mg/dL
Hgb urine dipstick: NEGATIVE
Ketones, ur: NEGATIVE mg/dL
Leukocytes, UA: NEGATIVE
Nitrite: NEGATIVE
Protein, ur: NEGATIVE mg/dL
Specific Gravity, Urine: 1.006 (ref 1.005–1.030)
pH: 7 (ref 5.0–8.0)

## 2018-07-31 LAB — CBC WITH DIFFERENTIAL/PLATELET
Abs Immature Granulocytes: 0.1 10*3/uL — ABNORMAL HIGH (ref 0.00–0.07)
Basophils Absolute: 0 10*3/uL (ref 0.0–0.1)
Basophils Relative: 0 %
Eosinophils Absolute: 0 10*3/uL (ref 0.0–0.5)
Eosinophils Relative: 0 %
HCT: 41 % (ref 36.0–46.0)
Hemoglobin: 13 g/dL (ref 12.0–15.0)
Immature Granulocytes: 1 %
Lymphocytes Relative: 11 %
Lymphs Abs: 1.2 10*3/uL (ref 0.7–4.0)
MCH: 29 pg (ref 26.0–34.0)
MCHC: 31.7 g/dL (ref 30.0–36.0)
MCV: 91.3 fL (ref 80.0–100.0)
Monocytes Absolute: 0.2 10*3/uL (ref 0.1–1.0)
Monocytes Relative: 2 %
Neutro Abs: 9.1 10*3/uL — ABNORMAL HIGH (ref 1.7–7.7)
Neutrophils Relative %: 86 %
Platelets: 494 10*3/uL — ABNORMAL HIGH (ref 150–400)
RBC: 4.49 MIL/uL (ref 3.87–5.11)
RDW: 13.8 % (ref 11.5–15.5)
WBC: 10.6 10*3/uL — ABNORMAL HIGH (ref 4.0–10.5)
nRBC: 0 % (ref 0.0–0.2)

## 2018-07-31 LAB — COMPREHENSIVE METABOLIC PANEL
ALT: 17 U/L (ref 0–44)
AST: 21 U/L (ref 15–41)
Albumin: 3.1 g/dL — ABNORMAL LOW (ref 3.5–5.0)
Alkaline Phosphatase: 64 U/L (ref 38–126)
Anion gap: 9 (ref 5–15)
BUN: 17 mg/dL (ref 8–23)
CO2: 27 mmol/L (ref 22–32)
Calcium: 9.1 mg/dL (ref 8.9–10.3)
Chloride: 101 mmol/L (ref 98–111)
Creatinine, Ser: 0.69 mg/dL (ref 0.44–1.00)
GFR calc Af Amer: >60 mL/min (ref >60)
GFR calc non Af Amer: >60 mL/min (ref >60)
Glucose, Bld: 144 mg/dL — ABNORMAL HIGH (ref 70–99)
Potassium: 3.9 mmol/L (ref 3.5–5.1)
Sodium: 137 mmol/L (ref 135–145)
Total Bilirubin: 0.4 mg/dL (ref 0.3–1.2)
Total Protein: 6.1 g/dL — ABNORMAL LOW (ref 6.5–8.1)

## 2018-07-31 MED ORDER — HYDROCODONE-ACETAMINOPHEN 5-325 MG PO TABS: 1.0000 | ORAL_TABLET | Freq: Four times a day (QID) | ORAL | 0 refills | Status: AC | PRN

## 2018-07-31 MED ORDER — OXYCODONE-ACETAMINOPHEN 5-325 MG PO TABS: 1.0000 | ORAL_TABLET | Freq: Once | ORAL | Status: DC

## 2018-07-31 MED ORDER — TRAMADOL HCL 50 MG PO TABS
50.0000 mg | ORAL_TABLET | Freq: Once | ORAL | Status: AC
  Administered 2018-07-31: 50 mg via ORAL
  Filled 2018-07-31: qty 1

## 2018-07-31 MED ORDER — ONDANSETRON 4 MG PO TBDP
4.0000 mg | ORAL_TABLET | Freq: Once | ORAL | Status: AC
  Administered 2018-07-31: 4 mg via ORAL
  Filled 2018-07-31: qty 1

## 2018-07-31 NOTE — ED Provider Notes (Signed)
[facility] Emergency Department Provider Note  ____________________________________________  Time seen: Approximately 6:09 PM  I have reviewed the triage vital signs and the nursing notes.   HISTORY  Chief Complaint Knee Pain    HPI Joanne [name] is a 84 y.o. female with a history of bilateral total hip arthroplasty, presents to the emergency department with 10 out of 10 right lower extremity pain. Patient is accompanied by her two daughters and son who state that patient has been complaining of pain since Thanksgiving day.  Patient reports that she was standing barefoot in her kitchen when she suddenly felt excruciating pain in the right hip which felt like it traveled from the "right hip to the left hip".  Patient states that it is been hard for her to ambulate since incident occurred. She has had some tingling of the right calf and calf swelling. Patient thinks right calf swelling is a chronic issue. Patient's family members state that patient has been unwilling to move very much and this is atypical behavior for her.  Patient was evaluated by me in June of this year and there have been significant changes in mental status since that time.  Patient currently lives independently. She has been taking tramadol for pain.   Past Medical History:  Diagnosis Date  . Anemia, unspecified    B12 and iron deficiency   . Arthritis   . B12 deficiency   . Collagen vascular disease ([place])   . Crohn disease ([place])   . Crohn's disease ([place])    followed by Dr. Vira Agar with serial colonoscopies  . Dyspnea   . Fuchs' endothelial dystrophy   . GERD (gastroesophageal reflux disease)   . History of bone density study 09/01/2012  . History of spinal fracture   . Hyperlipidemia, unspecified   . Hypertension   . Hypokalemia   . Hypomagnesemia   . Osteoporosis   . Other dysphagia 01/24/2017  . Pneumonia    1980's  . Protein calorie malnutrition ([place])   . Retinal detachment    . Right shoulder injury 05/2002   treated conservatively  . Trigger finger   . Zenker diverticulum 09/25/2016    Patient Active Problem List   Diagnosis Date Noted  . Avascular necrosis of bone of hip, left ([place]) 01/06/2018  . Anemia, unspecified 12/23/2017  . Crohn's disease ([place]) 12/23/2017  . Fuchs' corneal dystrophy 12/23/2017  . Hyperlipidemia, unspecified 12/23/2017  . Status post total replacement of hip 12/23/2017  . Benign essential HTN 12/16/2017  . Dyspnea on exertion 12/02/2017  . Protein-calorie malnutrition ([place]) 04/21/2017  . Age-related osteoporosis with current pathological fracture with routine healing 04/03/2017  . Other dysphagia 01/24/2017  . Hypokalemia 01/22/2017  . Zenker diverticulum 09/25/2016  . History of hypertension 05/03/2015  . Hyponatremia 02/21/2015  . Infected sebaceous cyst 11/10/2013  . Nuclear sclerotic cataract of right eye 09/08/2012  . Right shoulder injury 05/27/2002    Past Surgical History:  Procedure Laterality Date  . APPENDECTOMY  1949  . CATARACT EXTRACTION EXTRACAPSULAR Right 05/10/2015   Procedure: EXTRACTION CATARACT EXTRACAPSULAR W/INSERTION INTRAOCULAR PROSTHESIS; Surgeon: Doyce Para, MD; [facility]; Service: Ophthalmology; Laterality: Right;  . CHOLECYSTECTOMY  1992  . COLONOSCOPY     03/24/1992, 03/11/1998, 07/08/2002, 05/08/2006, 01/31/2012   PH Crohns disease; no repeat due to age per RTE (dw)  . CORNEAL TRANSPLANT Left 07/08/2012   Procedure: CORNEAL TRANSPLANT DSAEK ** tissue ordered OSI 06/27/2012; Surgeon: Doyce Para, MD; Location: [facility]; Service: Ophthalmology;  .  ESOPHAGOGASTRODUODENOSCOPY     05/01/2005, 01/31/2012 ; No repeat per RTE  . ESOPHAGOGASTRODUODENOSCOPY (EGD) WITH PROPOFOL N/A 05/30/2018   Procedure: ESOPHAGOGASTRODUODENOSCOPY (EGD) WITH PROPOFOL;  Surgeon: Manya Silvas, MD;  Location: [facility] ENDOSCOPY;  Service: Endoscopy;  Laterality: N/A;  . EYE SURGERY  2012, 2013   5 total  . JOINT  REPLACEMENT Right 01-28-13  . LENSECTOMY PHACOFRAGMENTATION WITH ASPIRATION Left 07/25/2011  . TONSILLECTOMY  1950  . TOTAL HIP ARTHROPLASTY Left 12/23/2017   Procedure: TOTAL HIP ARTHROPLASTY;  Surgeon: Dereck Leep, MD;  Location: [facility] ORS;  Service: Orthopedics;  Laterality: Left;  Marland Kitchen VITREOUS RETINAL SURGERY Left 11/07/2011   PPV/SO  . VITREOUS RETINAL SURGERY Left 04/18/2011   TPPV/TRP/SO/SB/EL/C3F8    Prior to Admission medications   Medication Sig Start Date End Date Taking? Authorizing Provider  acetaminophen (TYLENOL) 500 MG tablet Take 500 mg by mouth every 8 (eight) hours as needed for mild pain or moderate pain.     [provider]  Amino Acids-Protein Hydrolys (FEEDING SUPPLEMENT, PRO-STAT SUGAR FREE 64,) LIQD Take 30 mLs by mouth 2 (two) times daily.    [provider]  aspirin EC 325 MG tablet Take 325 mg by mouth daily.     [provider]  budesonide (ENTOCORT EC) 3 MG 24 hr capsule Take 9 mg by mouth daily.     [provider]  Calcium Carb-Cholecalciferol (CALCIUM 600 + D PO) Take 1 tablet by mouth 3 (three) times daily.     [provider]  cholecalciferol (VITAMIN D) 1000 units tablet Take 1,000 Units by mouth daily.     [provider]  Coenzyme Q10 (COQ10) 100 MG CAPS Take 2 capsules by mouth daily.    [provider]  gabapentin (NEURONTIN) 100 MG capsule Take 1 capsule by mouth 2 (two) times daily. In AM and at noon 12/10/17   [provider]  gabapentin (NEURONTIN) 300 MG capsule Take 300 mg by mouth at bedtime.    [provider]  HYDROcodone-acetaminophen (NORCO) 5-325 MG tablet Take 1 tablet by mouth every 6 (six) hours as needed for up to 3 days for moderate pain. 07/31/18 08/03/18  Lannie Fields, PA-C  iron polysaccharides (NIFEREX) 150 MG capsule Take 150 mg by mouth daily.    [provider]  loperamide (LOPERAMIDE A-D) 2 MG tablet Take 2 mg by mouth 4 (four) times daily as  needed for diarrhea or loose stools.    [provider]  Magnesium Oxide 250 MG TABS Take 500 mg by mouth daily.    [provider]  Multiple Vitamin (MULTIVITAMIN) tablet Take 1 tablet by mouth daily.    [provider]  NEXIUM 40 MG capsule Take 40 mg by mouth daily.  08/26/13   [provider]  Nutritional Supplements (ENSURE ENLIVE PO) Take 1 Bottle by mouth 2 (two) times daily.    [provider]  potassium chloride SA (K-DUR,KLOR-CON) 20 MEQ tablet Take 20 mEq by mouth 2 (two) times daily.    [provider]  prednisoLONE acetate (PRED FORTE) 1 % ophthalmic suspension Place 1 drop into both eyes at bedtime.    [provider]  Probiotic Product (RISA-BID PROBIOTIC) TABS Take 1 tablet by mouth daily.    [provider]  torsemide (DEMADEX) 5 MG tablet Take 5 mg by mouth daily as needed.     [provider]  vitamin B-12 (CYANOCOBALAMIN) 1000 MCG tablet Take 1,000 mcg by mouth daily.  [provider]  vitamin C (ASCORBIC ACID) 500 MG tablet Take 500 mg by mouth daily.    [provider]    Allergies Oxycodone  Family History  Problem Relation Age of Onset  . Hypertension Mother   . Stroke Father   . Hypertension Father   . Arthritis Other   . Prostate cancer Other   . Psoriasis Other   . Stroke Other   . Thyroid disease Other   . Anesthesia problems Neg Hx   . Diabetes Neg Hx   . Glaucoma Neg Hx   . Macular degeneration Neg Hx     Social History Social History   Tobacco Use  . Smoking status: Former Smoker    Packs/day: 0.50    Years: 35.00    Pack years: 17.50    Types: Cigarettes    Last attempt to quit: 01/29/1991    Years since quitting: 27.5  . Smokeless tobacco: Never Used  Substance Use Topics  . Alcohol use: Yes    Comment: 3x/yr  . Drug use: No     Review of Systems  Constitutional: No fever/chills Eyes: No visual changes. No discharge ENT: No upper  respiratory complaints. Cardiovascular: no chest pain. Respiratory: no cough. No SOB. Gastrointestinal: No abdominal pain.  No nausea, no vomiting.  No diarrhea.  No constipation. Musculoskeletal: Patient has right lower extremity pain.  Skin: Negative for rash, abrasions, lacerations, ecchymosis. Neurological: Negative for headaches, focal weakness or numbness.   ____________________________________________   PHYSICAL EXAM:  VITAL SIGNS: ED Triage Vitals  Enc Vitals Group     BP 07/31/18 1546 (!) 160/57     Pulse Rate 07/31/18 1546 71     Resp --      Temp 07/31/18 1546 98.3 F (36.8 C)     Temp Source 07/31/18 1546 Oral     SpO2 07/31/18 1546 96 %     Weight 07/31/18 1551 160 lb 12.8 oz (72.9 kg)     Height 07/31/18 1551 5' 2"  (1.575 m)     Head Circumference --      Peak Flow --      Pain Score 07/31/18 1550 8     Pain Loc --      Pain Edu? --      Excl. in [place]? --      Constitutional: Alert and oriented. Well appearing and in no acute distress. Eyes: Conjunctivae are normal. PERRL. EOMI. Head: Atraumatic. Neck: No stridor.  No cervical spine tenderness to palpation. Cardiovascular: Normal rate, regular rhythm. Normal S1 and S2.  Good peripheral circulation. Respiratory: Normal respiratory effort without tachypnea or retractions. Lungs CTAB. Good air entry to the bases with no decreased or absent breath sounds. Musculoskeletal: Full range of motion to all extremities. No gross deformities appreciated.  Negative straight leg raise test, right. Neurologic:  Normal speech and language. No gross focal neurologic deficits are appreciated.  Skin: Right calf is edematous but not erythematous.  Palpable dorsalis pedis pulse, right. Psychiatric: Mood and affect are normal. Speech and behavior are normal. Patient exhibits appropriate insight and judgement.   ____________________________________________   LABS (all labs ordered are listed, but only abnormal results are  displayed)  Labs Reviewed  URINALYSIS, COMPLETE (UACMP) WITH MICROSCOPIC - Abnormal; Notable for the following components:      Result Value   Color, Urine STRAW (*)    APPearance CLEAR (*)    All other components within normal limits  CBC WITH DIFFERENTIAL/PLATELET - Abnormal; Notable  for the following components:   WBC 10.6 (*)    Platelets 494 (*)    Neutro Abs 9.1 (*)    Abs Immature Granulocytes 0.10 (*)    All other components within normal limits  COMPREHENSIVE METABOLIC PANEL - Abnormal; Notable for the following components:   Glucose, Bld 144 (*)    Total Protein 6.1 (*)    Albumin 3.1 (*)    All other components within normal limits   ____________________________________________  EKG   ____________________________________________  RADIOLOGY I personally viewed and evaluated these images as part of my medical decision making, as well as reviewing the written report by the radiologist.  Dg Lumbar Spine 2-3 Views  Result Date: 07/31/2018 CLINICAL DATA:  Right knee and diffuse body pain. EXAM: LUMBAR SPINE - 2-3 VIEW COMPARISON:  Abdomen pelvis CT dated 05/14/2018. FINDINGS: Five non-rib-bearing lumbar vertebrae. Mild to moderate anterior and lateral spur formation throughout the lumbar and lower thoracic spine. Facet degenerative changes throughout the mid and lower lumbar spine. No fractures, pars defects or subluxations are seen. Cholecystectomy clips and right hip prosthesis. IMPRESSION: Degenerative changes. No acute abnormality. Electronically Signed   By: Claudie Revering M.D.   On: 07/31/2018 18:33   US Venous Img Lower Unilateral Right  Result Date: 07/31/2018 CLINICAL DATA:  Right leg pain EXAM: RIGHT LOWER EXTREMITY VENOUS DOPPLER ULTRASOUND TECHNIQUE: Gray-scale sonography with graded compression, as well as color Doppler and duplex ultrasound were performed to evaluate the lower extremity deep venous systems from the level of the common femoral vein and including  the common femoral, femoral, profunda femoral, popliteal and calf veins including the posterior tibial, peroneal and gastrocnemius veins when visible. The superficial great saphenous vein was also interrogated. Spectral Doppler was utilized to evaluate flow at rest and with distal augmentation maneuvers in the common femoral, femoral and popliteal veins. COMPARISON:  None. FINDINGS: Contralateral Common Femoral Vein: Respiratory phasicity is normal and symmetric with the symptomatic side. No evidence of thrombus. Normal compressibility. Common Femoral Vein: No evidence of thrombus. Normal compressibility, respiratory phasicity and response to augmentation. Saphenofemoral Junction: No evidence of thrombus. Normal compressibility and flow on color Doppler imaging. Profunda Femoral Vein: No evidence of thrombus. Normal compressibility and flow on color Doppler imaging. Femoral Vein: No evidence of thrombus. Normal compressibility, respiratory phasicity and response to augmentation. Popliteal Vein: No evidence of thrombus. Normal compressibility, respiratory phasicity and response to augmentation. Calf Veins: No evidence of thrombus. Normal compressibility and flow on color Doppler imaging. Superficial Great Saphenous Vein: No evidence of thrombus. Normal compressibility. Venous Reflux:  None. Other Findings:  None. IMPRESSION: No evidence of deep venous thrombosis. Electronically Signed   By: Rolm Baptise M.D.   On: 07/31/2018 18:58   Dg Knee Complete 4 Views Right  Result Date: 07/31/2018 CLINICAL DATA:  Right knee pain. EXAM: RIGHT KNEE - COMPLETE 4+ VIEW COMPARISON:  No recent. FINDINGS: No acute bony or joint abnormality identified. No evidence of fracture or dislocation. Mild degenerative change with chondrocalcinosis noted. Calcification noted in the quadriceps tendon, this is most likely dystrophic. Peripheral vascular calcification noted. IMPRESSION: 1.  No acute bony abnormality identified. 2.  Mild  degenerative change with chondrocalcinosis. 3. Calcification noted in the quadriceps tendon. This is most likely dystrophic and secondary tendinopathy. 4.  Peripheral vascular disease. Electronically Signed   By: Marcello Moores  Register   On: 07/31/2018 16:39   Dg Hip Unilat W Or Wo Pelvis 2-3 Views Right  Result Date: 07/31/2018 CLINICAL DATA:  Right  hip pain EXAM: DG HIP (WITH OR WITHOUT PELVIS) 2-3V RIGHT COMPARISON:  04/01/2017 FINDINGS: Old fracture of the right inferior pubic ramus. Right hip arthroplasty is located without a periprosthetic fracture. Pelvic bony ring is intact. No gross abnormality to the left hip arthroplasty but the entire left femoral prosthesis is not imaged. IMPRESSION: No acute abnormality to the pelvis or right hip. Electronically Signed   By: Markus Daft M.D.   On: 07/31/2018 18:32    ____________________________________________    PROCEDURES  Procedure(s) performed:    Procedures    Medications  ondansetron (ZOFRAN-ODT) disintegrating tablet 4 mg (4 mg Oral Given 07/31/18 1804)  traMADol (ULTRAM) tablet 50 mg (50 mg Oral Given 07/31/18 1804)     ____________________________________________   INITIAL IMPRESSION / ASSESSMENT AND PLAN / ED COURSE  Pertinent labs & imaging results that were available during my care of the patient were reviewed by me and considered in my medical decision making (see chart for details).  Review of the [place] CSRS was performed in accordance of the [place] prior to dispensing any controlled drugs.  Clinical Course as of Jul 31 2229  Thu Jul 31, 2018  1605 Temp: 98.3 F (36.8 C) [JW]    Clinical Course User Index [JW] Vallarie Mare M, [place]     Assessment and plan Right lower extremity pain Patient presents to the emergency department with reports of 10 out of 10 right lower extremity pain for approximately 1 week.  Differential diagnosis included lumbar compression fracture, disrupted hardware from right total hip arthroplasty,  DVT, cystitis and electrolyte abnormality.  X-ray examination of the right hip, lumbar spine and right knee were reassuring without acute abnormality.  CBC and BMP were reassuring.  Urinalysis was noncontributory for cystitis.  Venous ultrasound did not reveal evidence of thromboembolism.  Patient's pain improved some with tramadol in the emergency department.  Patient has been using tramadol for pain at home.  I prescribed patient a a short course of Norco for severe pain.  Patient is currently taking prednisone and is on her third day.  I discussed fall risk precautions with patient's children and they voiced understanding.  Patient has a follow-up appointment with Dr. Marry Guan on August 08, 2018.  Vital signs were reassuring prior to discharge.  All patient questions were answered.     ____________________________________________  FINAL CLINICAL IMPRESSION(S) / ED DIAGNOSES  Final diagnoses:  Pain of right lower extremity      NEW MEDICATIONS STARTED DURING THIS VISIT:  ED Discharge Orders         Ordered    HYDROcodone-acetaminophen ([place]) 5-325 MG tablet  Every 6 hours PRN     07/31/18 2124              This chart was dictated using voice recognition software/Dragon. Despite best efforts to proofread, errors can occur which can change the meaning. Any change was purely unintentional.    Lannie Fields, PA-C 07/31/18 [place], [place], MD 07/31/18 [phone]

## 2018-07-31 NOTE — ED Notes (Signed)
Urine sample sent to lab: purewick used.

## 2018-07-31 NOTE — ED Notes (Signed)
Pt states she has generalized pain but right now its mostly her legs. Pulses, skin color, warmth WDL.

## 2018-07-31 NOTE — ED Triage Notes (Signed)
Pt brought in via EMS from home for R knee pain which she has been experiencing for over a month now. States she was seen at [place] they gave her hasn't helped any. States the pain has been generalized all over her body.

## 2018-08-07 ENCOUNTER — Emergency Department
Admission: EM | Admit: 2018-08-07 | Discharge: 2018-08-07 | Disposition: A | Discharge status: Home / Self Care | Payer: Medicare Other | Attending: Emergency Medicine | Admitting: Emergency Medicine

## 2018-08-07 ENCOUNTER — Other Ambulatory Visit: Payer: Self-pay

## 2018-08-07 ENCOUNTER — Emergency Department: Payer: Medicare Other

## 2018-08-07 DIAGNOSIS — Z96642 Presence of left artificial hip joint: Secondary | ICD-10-CM | POA: Insufficient documentation

## 2018-08-07 DIAGNOSIS — I10 Essential (primary) hypertension: Secondary | ICD-10-CM | POA: Diagnosis not present

## 2018-08-07 DIAGNOSIS — M51369 Other intervertebral disc degeneration, lumbar region without mention of lumbar back pain or lower extremity pain: Secondary | ICD-10-CM

## 2018-08-07 DIAGNOSIS — Z87891 Personal history of nicotine dependence: Secondary | ICD-10-CM | POA: Insufficient documentation

## 2018-08-07 DIAGNOSIS — S32030G Wedge compression fracture of third lumbar vertebra, subsequent encounter for fracture with delayed healing: Secondary | ICD-10-CM

## 2018-08-07 DIAGNOSIS — X58XXXA Exposure to other specified factors, initial encounter: Secondary | ICD-10-CM | POA: Insufficient documentation

## 2018-08-07 DIAGNOSIS — M545 Low back pain: Secondary | ICD-10-CM | POA: Diagnosis present

## 2018-08-07 DIAGNOSIS — M5136 Other intervertebral disc degeneration, lumbar region: Secondary | ICD-10-CM | POA: Diagnosis not present

## 2018-08-07 LAB — BASIC METABOLIC PANEL
Anion gap: 10 (ref 5–15)
BUN: 28 mg/dL — AB (ref 8–23)
CHLORIDE: 98 mmol/L (ref 98–111)
CO2: 25 mmol/L (ref 22–32)
Calcium: 9 mg/dL (ref 8.9–10.3)
Creatinine, Ser: 0.88 mg/dL (ref 0.44–1.00)
GFR calc non Af Amer: 60 mL/min — ABNORMAL LOW (ref >60)
Glucose, Bld: 118 mg/dL — ABNORMAL HIGH (ref 70–99)
Potassium: 4.8 mmol/L (ref 3.5–5.1)
Sodium: 133 mmol/L — ABNORMAL LOW (ref 135–145)

## 2018-08-07 LAB — CBC
HCT: 41.2 % (ref 36.0–46.0)
HEMOGLOBIN: 13 g/dL (ref 12.0–15.0)
MCH: 29.1 pg (ref 26.0–34.0)
MCHC: 31.6 g/dL (ref 30.0–36.0)
MCV: 92.2 fL (ref 80.0–100.0)
NRBC: 0 % (ref 0.0–0.2)
Platelets: 445 10*3/uL — ABNORMAL HIGH (ref 150–400)
RBC: 4.47 MIL/uL (ref 3.87–5.11)
RDW: 13.7 % (ref 11.5–15.5)
WBC: 12.7 10*3/uL — ABNORMAL HIGH (ref 4.0–10.5)

## 2018-08-07 MED ORDER — HYDROMORPHONE HCL 1 MG/ML IJ SOLN
INTRAMUSCULAR | Status: AC
  Administered 2018-08-07: 0.5 mg via INTRAVENOUS
  Filled 2018-08-07: qty 1

## 2018-08-07 MED ORDER — HYDROMORPHONE HCL 2 MG PO TABS
2.0000 mg | ORAL_TABLET | Freq: Once | ORAL | Status: AC
  Administered 2018-08-07: 2 mg via ORAL
  Filled 2018-08-07: qty 1

## 2018-08-07 MED ORDER — HYDROMORPHONE HCL 1 MG/ML IJ SOLN
0.5000 mg | Freq: Once | INTRAMUSCULAR | Status: AC
  Administered 2018-08-07: 0.5 mg via INTRAVENOUS

## 2018-08-07 MED ORDER — HYDROMORPHONE HCL 2 MG PO TABS: 1.0000 mg | ORAL_TABLET | Freq: Two times a day (BID) | ORAL | 0 refills | Status: DC | PRN

## 2018-08-07 NOTE — ED Notes (Signed)
[employer] called, TLSO brace ordered. ETA 2 hours.

## 2018-08-07 NOTE — ED Notes (Signed)
FIRST NURSE NOTE:  Pt escorted from [facility] neuro clinic with the need for an MRI, family states they would have to wait weeks for MRI if it was done outpatient.  Family states the patient is in "excruciating" pain.

## 2018-08-07 NOTE — ED Triage Notes (Signed)
Pt family member states that pt was here a week ago for same thing. States was given "pain killers and prednisone. We were referred to neurosurgery and they said they can't do anything." family member is requesting MRI.   Pt states pain all over "but basically R side". Family member states low back pain that goes down R leg.   A&O, in wheelchair. No distress noted.

## 2018-08-07 NOTE — ED Notes (Signed)
Patient transported to MRI 

## 2018-08-07 NOTE — ED Notes (Signed)
TLSO completed by [employer]

## 2018-08-07 NOTE — ED Provider Notes (Addendum)
[facility] Emergency Department Provider Note       Time seen: ----------------------------------------- 7:13 PM on 08/07/2018 -----------------------------------------   I have reviewed the triage vital signs and the nursing notes.  HISTORY   Chief Complaint Back Pain    HPI LACHELL ROCHETTE is a 84 y.o. female with a history of anemia, arthritis, Crohn's, GERD, hyperlipidemia, hypertension, hypokalemia, hypomagnesemia who presents to the ED for severe back pain.  Patient was seen today by neurosurgery, PA stated that she would need an MRI as an outpatient but no further testing was going to be done today.  Patient was given a steroid taper and currently takes hydrocodone.  She is on her second last day of prednisone.  Pain seems to radiate down her right leg.  Past Medical History:  Diagnosis Date  . Anemia, unspecified    B12 and iron deficiency   . Arthritis   . B12 deficiency   . Collagen vascular disease ([place])   . Crohn disease ([place])   . Crohn's disease ([place])    followed by Dr. Vira Agar with serial colonoscopies  . Dyspnea   . Fuchs' endothelial dystrophy   . GERD (gastroesophageal reflux disease)   . History of bone density study 09/01/2012  . History of spinal fracture   . Hyperlipidemia, unspecified   . Hypertension   . Hypokalemia   . Hypomagnesemia   . Osteoporosis   . Other dysphagia 01/24/2017  . Pneumonia    1980's  . Protein calorie malnutrition ([place])   . Retinal detachment   . Right shoulder injury 05/2002   treated conservatively  . Trigger finger   . Zenker diverticulum 09/25/2016    Patient Active Problem List   Diagnosis Date Noted  . Avascular necrosis of bone of hip, left ([place]) 01/06/2018  . Anemia, unspecified 12/23/2017  . Crohn's disease ([place]) 12/23/2017  . Fuchs' corneal dystrophy 12/23/2017  . Hyperlipidemia, unspecified 12/23/2017  . Status post total replacement of hip 12/23/2017  . Benign essential HTN  12/16/2017  . Dyspnea on exertion 12/02/2017  . Protein-calorie malnutrition ([place]) 04/21/2017  . Age-related osteoporosis with current pathological fracture with routine healing 04/03/2017  . Other dysphagia 01/24/2017  . Hypokalemia 01/22/2017  . Zenker diverticulum 09/25/2016  . History of hypertension 05/03/2015  . Hyponatremia 02/21/2015  . Infected sebaceous cyst 11/10/2013  . Nuclear sclerotic cataract of right eye 09/08/2012  . Right shoulder injury 05/27/2002    Past Surgical History:  Procedure Laterality Date  . APPENDECTOMY  1949  . CATARACT EXTRACTION EXTRACAPSULAR Right 05/10/2015   Procedure: EXTRACTION CATARACT EXTRACAPSULAR W/INSERTION INTRAOCULAR PROSTHESIS; Surgeon: Doyce Para, MD; [facility]; Service: Ophthalmology; Laterality: Right;  . CHOLECYSTECTOMY  1992  . COLONOSCOPY     03/24/1992, 03/11/1998, 07/08/2002, 05/08/2006, 01/31/2012   PH Crohns disease; no repeat due to age per RTE (dw)  . CORNEAL TRANSPLANT Left 07/08/2012   Procedure: CORNEAL TRANSPLANT DSAEK ** tissue ordered OSI 06/27/2012; Surgeon: Doyce Para, MD; Location: [place]; Service: Ophthalmology;  . ESOPHAGOGASTRODUODENOSCOPY     05/01/2005, 01/31/2012 ; No repeat per RTE  . ESOPHAGOGASTRODUODENOSCOPY (EGD) WITH PROPOFOL N/A 05/30/2018   Procedure: ESOPHAGOGASTRODUODENOSCOPY (EGD) WITH PROPOFOL;  Surgeon: Manya Silvas, MD;  Location: [facility] ENDOSCOPY;  Service: Endoscopy;  Laterality: N/A;  . EYE SURGERY  2012, 2013   5 total  . JOINT REPLACEMENT Right 01-28-13  . LENSECTOMY PHACOFRAGMENTATION WITH ASPIRATION Left 07/25/2011  . TONSILLECTOMY  1950  . TOTAL HIP ARTHROPLASTY Left 12/23/2017  Procedure: TOTAL HIP ARTHROPLASTY;  Surgeon: Dereck Leep, MD;  Location: [facility] ORS;  Service: Orthopedics;  Laterality: Left;  Marland Kitchen VITREOUS RETINAL SURGERY Left 11/07/2011   PPV/SO  . VITREOUS RETINAL SURGERY Left 04/18/2011   TPPV/TRP/SO/SB/EL/C3F8    Allergies Oxycodone  Social  History Social History   Tobacco Use  . Smoking status: Former Smoker    Packs/day: 0.50    Years: 35.00    Pack years: 17.50    Types: Cigarettes    Last attempt to quit: 01/29/1991    Years since quitting: 27.5  . Smokeless tobacco: Never Used  Substance Use Topics  . Alcohol use: Yes    Comment: 3x/yr  . Drug use: No   Review of Systems Constitutional: Negative for fever. Cardiovascular: Negative for chest pain. Respiratory: Negative for shortness of breath. Gastrointestinal: Negative for abdominal pain, vomiting and diarrhea. Genitourinary: Negative for dysuria.  Negative for urinary retention or incontinence Musculoskeletal: Positive for back pain, right leg pain Skin: Negative for rash. Neurological: Negative for headaches, focal weakness or numbness.  All systems negative/normal/unremarkable except as stated in the HPI  ____________________________________________   PHYSICAL EXAM:  VITAL SIGNS: ED Triage Vitals  Enc Vitals Group     BP 08/07/18 1621 (!) 141/60     Pulse Rate 08/07/18 1620 78     Resp 08/07/18 1620 18     Temp 08/07/18 1620 98.1 F (36.7 C)     Temp Source 08/07/18 1620 Oral     SpO2 08/07/18 1620 98 %     Weight 08/07/18 1621 158 lb 11.7 oz (72 kg)     Height 08/07/18 1621 5' 2"  (1.575 m)     Head Circumference --      Peak Flow --      Pain Score --      Pain Loc --      Pain Edu? --      Excl. in Major? --    Constitutional: Alert and oriented.  Mild distress from pain ENT   Head: Normocephalic and atraumatic.   Nose: No congestion/rhinnorhea.   Mouth/Throat: Mucous membranes are moist.   Neck: No stridor. Cardiovascular: Normal rate, regular rhythm. No murmurs, rubs, or gallops. Respiratory: Normal respiratory effort without tachypnea nor retractions. Breath sounds are clear and equal bilaterally. No wheezes/rales/rhonchi. Gastrointestinal: Soft and nontender. Normal bowel sounds Musculoskeletal: Nontender with normal  range of motion in extremities.  Some edema is noted, limited range of motion of the spine Neurologic:  Normal speech and language. No gross focal neurologic deficits are appreciated.  Skin:  Skin is warm, dry and intact. No rash noted. Psychiatric: Mood and affect are normal. Speech and behavior are normal.  ____________________________________________  ED COURSE:  As part of my medical decision making, I reviewed the following data within the [place] History obtained from family if available, nursing notes, old chart and ekg, as well as notes from prior ED visits. Patient presented for severe back pain, we will assess with labs and imaging as indicated at this time.   Procedures ____________________________________________   LABS (pertinent positives/negatives)  Labs Reviewed  CBC - Abnormal; Notable for the following components:      Result Value   WBC 12.7 (*)    Platelets 445 (*)    All other components within normal limits  BASIC METABOLIC PANEL - Abnormal; Notable for the following components:   Sodium 133 (*)    Glucose, Bld 118 (*)    BUN 28 (*)  GFR calc non Af Amer 60 (*)    All other components within normal limits    RADIOLOGY Images were viewed by me  MRI of the lumbar sacral spine IMPRESSION: 1. Deformity of the L3 inferior endplate with moderate marrow edema, suggesting subacute compression fracture. This does appear new compared to the CT from 05/14/2018, allowing for differences in technique. Correlate for any history of recent trauma. 2. Moderate spinal canal and severe bilateral neural foraminal stenosis at L3-4. 3. L4-5 left lateral recess narrowing that may be a source of left L5 radiculopathy. 4. L4-5 mild right and moderate left neural foraminal stenosis. ____________________________________________  DIFFERENTIAL DIAGNOSIS   Degenerative disc disease, sciatica, arthritis, cauda equina syndrome unlikely  FINAL ASSESSMENT  AND PLAN  Degenerative disc disease, L3 compression fracture   Plan: The patient had presented for severe low back pain. Patient's labs are grossly unremarkable. Patient's imaging did reveal L3 inferior endplate deformity consistent with subacute compression fracture.  She also had degenerative disc disease and some areas of stenosis.  She has been on steroids, I will increase her pain medicine, she will be fitted in a TLSO and I will advise close outpatient follow-up with neurosurgery.   Laurence Aly, MD   Note: This note was generated in part or whole with voice recognition software. Voice recognition is usually quite accurate but there are transcription errors that can and very often do occur. I apologize for any typographical errors that were not detected and corrected.     Earleen Newport, MD 08/07/18 2055    Earleen Newport, MD 08/07/18 2200

## 2018-08-07 NOTE — ED Notes (Signed)
ED Provider at bedside. 

## 2018-08-11 ENCOUNTER — Observation Stay
Admission: AD | Admit: 2018-08-11 | Discharge: 2018-08-13 | Disposition: A | Discharge status: Skilled Nursing Facility | Payer: Medicare Other | Source: Ambulatory Visit | Attending: Internal Medicine | Admitting: Internal Medicine

## 2018-08-11 ENCOUNTER — Other Ambulatory Visit: Payer: Self-pay

## 2018-08-11 DIAGNOSIS — S32030A Wedge compression fracture of third lumbar vertebra, initial encounter for closed fracture: Principal | ICD-10-CM | POA: Insufficient documentation

## 2018-08-11 DIAGNOSIS — Z7982 Long term (current) use of aspirin: Secondary | ICD-10-CM | POA: Insufficient documentation

## 2018-08-11 DIAGNOSIS — X58XXXA Exposure to other specified factors, initial encounter: Secondary | ICD-10-CM | POA: Diagnosis not present

## 2018-08-11 DIAGNOSIS — Z79899 Other long term (current) drug therapy: Secondary | ICD-10-CM | POA: Diagnosis not present

## 2018-08-11 DIAGNOSIS — Z96642 Presence of left artificial hip joint: Secondary | ICD-10-CM | POA: Diagnosis not present

## 2018-08-11 DIAGNOSIS — Z419 Encounter for procedure for purposes other than remedying health state, unspecified: Secondary | ICD-10-CM

## 2018-08-11 DIAGNOSIS — K219 Gastro-esophageal reflux disease without esophagitis: Secondary | ICD-10-CM | POA: Diagnosis not present

## 2018-08-11 DIAGNOSIS — K5090 Crohn's disease, unspecified, without complications: Secondary | ICD-10-CM | POA: Diagnosis not present

## 2018-08-11 DIAGNOSIS — M48061 Spinal stenosis, lumbar region without neurogenic claudication: Secondary | ICD-10-CM | POA: Insufficient documentation

## 2018-08-11 DIAGNOSIS — M810 Age-related osteoporosis without current pathological fracture: Secondary | ICD-10-CM | POA: Diagnosis not present

## 2018-08-11 DIAGNOSIS — Z87891 Personal history of nicotine dependence: Secondary | ICD-10-CM | POA: Insufficient documentation

## 2018-08-11 DIAGNOSIS — M4850XA Collapsed vertebra, not elsewhere classified, site unspecified, initial encounter for fracture: Secondary | ICD-10-CM

## 2018-08-11 DIAGNOSIS — I10 Essential (primary) hypertension: Secondary | ICD-10-CM | POA: Insufficient documentation

## 2018-08-11 DIAGNOSIS — D519 Vitamin B12 deficiency anemia, unspecified: Secondary | ICD-10-CM | POA: Diagnosis not present

## 2018-08-11 LAB — COMPREHENSIVE METABOLIC PANEL
ALT: 17 U/L (ref 0–44)
ANION GAP: 8 (ref 5–15)
AST: 19 U/L (ref 15–41)
Albumin: 2.7 g/dL — ABNORMAL LOW (ref 3.5–5.0)
Alkaline Phosphatase: 61 U/L (ref 38–126)
BUN: 23 mg/dL (ref 8–23)
CO2: 23 mmol/L (ref 22–32)
Calcium: 8.5 mg/dL — ABNORMAL LOW (ref 8.9–10.3)
Chloride: 104 mmol/L (ref 98–111)
Creatinine, Ser: 0.71 mg/dL (ref 0.44–1.00)
GFR calc Af Amer: >60 mL/min (ref >60)
GFR calc non Af Amer: >60 mL/min (ref >60)
Glucose, Bld: 235 mg/dL — ABNORMAL HIGH (ref 70–99)
Potassium: 4.5 mmol/L (ref 3.5–5.1)
Sodium: 135 mmol/L (ref 135–145)
Total Bilirubin: 0.4 mg/dL (ref 0.3–1.2)
Total Protein: 5.6 g/dL — ABNORMAL LOW (ref 6.5–8.1)

## 2018-08-11 MED ORDER — LOPERAMIDE HCL 2 MG PO CAPS
2.0000 mg | ORAL_CAPSULE | Freq: Four times a day (QID) | ORAL | Status: DC | PRN
  Administered 2018-08-12 – 2018-08-13 (×4): 2 mg via ORAL
  Filled 2018-08-11 (×5): qty 1

## 2018-08-11 MED ORDER — PREDNISOLONE ACETATE 1 % OP SUSP
1.0000 [drp] | Freq: Every day | OPHTHALMIC | Status: DC
  Administered 2018-08-11 – 2018-08-12 (×2): 1 [drp] via OPHTHALMIC
  Filled 2018-08-11: qty 1

## 2018-08-11 MED ORDER — SENNOSIDES-DOCUSATE SODIUM 8.6-50 MG PO TABS: 1.0000 | ORAL_TABLET | Freq: Every evening | ORAL | Status: DC | PRN

## 2018-08-11 MED ORDER — HYDROMORPHONE HCL 1 MG/ML IJ SOLN
0.5000 mg | INTRAMUSCULAR | Status: DC | PRN
  Administered 2018-08-11 – 2018-08-13 (×4): 0.5 mg via INTRAVENOUS
  Filled 2018-08-11 (×4): qty 1

## 2018-08-11 MED ORDER — MAGNESIUM OXIDE 400 (241.3 MG) MG PO TABS
400.0000 mg | ORAL_TABLET | Freq: Every day | ORAL | Status: DC
  Administered 2018-08-13: 400 mg via ORAL
  Filled 2018-08-11: qty 1

## 2018-08-11 MED ORDER — ONDANSETRON HCL 4 MG/2ML IJ SOLN: 4.0000 mg | Freq: Four times a day (QID) | INTRAMUSCULAR | Status: DC | PRN

## 2018-08-11 MED ORDER — BUDESONIDE 3 MG PO CPEP
9.0000 mg | ORAL_CAPSULE | Freq: Every day | ORAL | Status: DC
  Administered 2018-08-13: 9 mg via ORAL
  Filled 2018-08-11 (×3): qty 3

## 2018-08-11 MED ORDER — POLYSACCHARIDE IRON COMPLEX 150 MG PO CAPS
150.0000 mg | ORAL_CAPSULE | Freq: Every day | ORAL | Status: DC
  Administered 2018-08-13: 150 mg via ORAL
  Filled 2018-08-11 (×3): qty 1

## 2018-08-11 MED ORDER — COQ10 100 MG PO CAPS: 2.0000 | ORAL_CAPSULE | Freq: Every day | ORAL | Status: DC

## 2018-08-11 MED ORDER — TRAMADOL HCL 50 MG PO TABS
50.0000 mg | ORAL_TABLET | Freq: Four times a day (QID) | ORAL | Status: DC | PRN
  Administered 2018-08-12: 50 mg via ORAL
  Filled 2018-08-11: qty 1

## 2018-08-11 MED ORDER — ASPIRIN EC 325 MG PO TBEC
325.0000 mg | DELAYED_RELEASE_TABLET | Freq: Every day | ORAL | Status: DC
  Administered 2018-08-13: 325 mg via ORAL

## 2018-08-11 MED ORDER — CALCIUM CARBONATE ANTACID 500 MG PO CHEW
1.0000 | CHEWABLE_TABLET | Freq: Two times a day (BID) | ORAL | Status: DC
  Administered 2018-08-12 – 2018-08-13 (×2): 200 mg via ORAL
  Filled 2018-08-11 (×2): qty 1

## 2018-08-11 MED ORDER — VITAMIN B-12 1000 MCG PO TABS
1000.0000 ug | ORAL_TABLET | Freq: Every day | ORAL | Status: DC
  Administered 2018-08-13: 1000 ug via ORAL
  Filled 2018-08-11: qty 1

## 2018-08-11 MED ORDER — ONDANSETRON HCL 4 MG PO TABS: 4.0000 mg | ORAL_TABLET | Freq: Four times a day (QID) | ORAL | Status: DC | PRN

## 2018-08-11 MED ORDER — ADULT MULTIVITAMIN W/MINERALS CH
1.0000 | ORAL_TABLET | Freq: Every day | ORAL | Status: DC
  Administered 2018-08-13: 1 via ORAL
  Filled 2018-08-11 (×3): qty 1

## 2018-08-11 MED ORDER — SODIUM CHLORIDE 0.9% FLUSH: 3.0000 mL | INTRAVENOUS | Status: DC | PRN

## 2018-08-11 MED ORDER — ACETAMINOPHEN 325 MG PO TABS
650.0000 mg | ORAL_TABLET | Freq: Four times a day (QID) | ORAL | Status: DC | PRN
  Administered 2018-08-12: 650 mg via ORAL
  Filled 2018-08-11: qty 2

## 2018-08-11 MED ORDER — GABAPENTIN 300 MG PO CAPS
300.0000 mg | ORAL_CAPSULE | Freq: Every day | ORAL | Status: DC
  Administered 2018-08-11 – 2018-08-12 (×2): 300 mg via ORAL
  Filled 2018-08-11 (×2): qty 1

## 2018-08-11 MED ORDER — ENOXAPARIN SODIUM 40 MG/0.4ML ~~LOC~~ SOLN: 40.0000 mg | SUBCUTANEOUS | Status: DC

## 2018-08-11 MED ORDER — CEFAZOLIN SODIUM-DEXTROSE 1-4 GM/50ML-% IV SOLN
1.0000 g | Freq: Once | INTRAVENOUS | Status: AC
  Administered 2018-08-12: 1 g via INTRAVENOUS
  Filled 2018-08-11: qty 50

## 2018-08-11 MED ORDER — VITAMIN D3 25 MCG (1000 UNIT) PO TABS
1000.0000 [IU] | ORAL_TABLET | Freq: Every day | ORAL | Status: DC
  Administered 2018-08-13: 1000 [IU] via ORAL
  Filled 2018-08-11 (×4): qty 1

## 2018-08-11 MED ORDER — ACETAMINOPHEN 650 MG RE SUPP: 650.0000 mg | Freq: Four times a day (QID) | RECTAL | Status: DC | PRN

## 2018-08-11 MED ORDER — MAGNESIUM OXIDE 250 MG PO TABS: 500.0000 mg | ORAL_TABLET | Freq: Every day | ORAL | Status: DC

## 2018-08-11 MED ORDER — VITAMIN C 500 MG PO TABS
500.0000 mg | ORAL_TABLET | Freq: Every day | ORAL | Status: DC
  Administered 2018-08-13: 500 mg via ORAL
  Filled 2018-08-11: qty 1

## 2018-08-11 MED ORDER — PRO-STAT SUGAR FREE PO LIQD
30.0000 mL | Freq: Two times a day (BID) | ORAL | Status: DC
  Administered 2018-08-11 – 2018-08-12 (×2): 30 mL via ORAL

## 2018-08-11 MED ORDER — BACID PO TABS
1.0000 | ORAL_TABLET | Freq: Every day | ORAL | Status: DC
  Administered 2018-08-13: 1 via ORAL
  Filled 2018-08-11 (×3): qty 1

## 2018-08-11 MED ORDER — HYDROMORPHONE HCL 1 MG/ML IJ SOLN: 1.0000 mg | INTRAMUSCULAR | Status: DC | PRN

## 2018-08-11 MED ORDER — SODIUM CHLORIDE 0.9% FLUSH
3.0000 mL | Freq: Two times a day (BID) | INTRAVENOUS | Status: DC
  Administered 2018-08-12: 3 mL via INTRAVENOUS

## 2018-08-11 MED ORDER — SODIUM CHLORIDE 0.9 % IV SOLN: 250.0000 mL | INTRAVENOUS | Status: DC | PRN

## 2018-08-11 MED ORDER — PANTOPRAZOLE SODIUM 40 MG PO TBEC
40.0000 mg | DELAYED_RELEASE_TABLET | Freq: Every day | ORAL | Status: DC
  Administered 2018-08-13: 40 mg via ORAL
  Filled 2018-08-11: qty 1

## 2018-08-11 MED ORDER — ENSURE ENLIVE PO LIQD: 237.0000 mL | Freq: Two times a day (BID) | ORAL | Status: DC

## 2018-08-11 NOTE — Progress Notes (Signed)
PHARMACIST - PHYSICIAN ORDER COMMUNICATION  CONCERNING: P&T Medication Policy on Herbal Medications  DESCRIPTION:  This patient's order for:  CoQ 10  has been noted.  This product(s) is classified as an "herbal" or natural product. Due to a lack of definitive safety studies or FDA approval, nonstandard manufacturing practices, plus the potential risk of unknown drug-drug interactions while on inpatient medications, the Pharmacy and Therapeutics Committee does not permit the use of "herbal" or natural products of this type within [facility].   ACTION TAKEN: The pharmacy department is unable to verify this order at this time. Please reevaluate patient's clinical condition at discharge and address if the herbal or natural product(s) should be resumed at that time.

## 2018-08-11 NOTE — Progress Notes (Signed)
Advanced care plan.  Purpose of the Encounter: CODE STATUS  Parties in Attendance: Patient and family  Patient's Decision Capacity: Good  Subjective/Patient's story: Presented to emergency room for back pain and difficulty in ambulation   Objective/Medical story Patient has subacute vertebral compression fracture being followed by primary care physician Referred for back pain control as well as gait and endurance training exercises   Goals of care determination:  Advance care directives goals of care and treatment plan discussed Patient does not want any CPR, intubation ventilator if the need arises  CODE STATUS: DNR   Time spent discussing advanced care planning: 16 minutes

## 2018-08-11 NOTE — H&P (Signed)
[place] [facility] [place] NAME: Joanne [name]    MR#:  [id number]  DATE OF BIRTH:  [birth year]  DATE OF ADMISSION:  08/11/2018  PRIMARY CARE PHYSICIAN: Tracie Harrier, MD   REQUESTING/REFERRING PHYSICIAN:   CHIEF COMPLAINT: Back pain  HISTORY OF PRESENT ILLNESS: Joanne [name]  is a 84 y.o. female with a known history of spinal stenosis, Crohn's disease, arthritis, hypertension, hyperlipidemia, vertebral compression fracture, osteoporosis was referred from primary care physician office for intractable back pain and gait imbalance.  Patient has subacute vertebral compression fracture which was diagnosed with MRI of the lower back.  She had IV steroid injection to the back today.  Patient states back pain is still intractable she is unable to get up and walk around.  She also fell down this morning while getting out of the bed.  No complaints of any bowel bladder incontinence.  No loss of sensation in the lower extremities.  Hospitalist service was consulted for further care.  Recent MRI shows L3 vertebral compression fracture which is subacute, spinal stenosis but no evidence of any cord involvement.  PAST MEDICAL HISTORY:   Past Medical History:  Diagnosis Date  . Anemia, unspecified    B12 and iron deficiency   . Arthritis   . B12 deficiency   . Collagen vascular disease ([place])   . Crohn disease ([place])   . Crohn's disease ([place])    followed by Dr. Vira Agar with serial colonoscopies  . Dyspnea   . Fuchs' endothelial dystrophy   . GERD (gastroesophageal reflux disease)   . History of bone density study 09/01/2012  . History of spinal fracture   . Hyperlipidemia, unspecified   . Hypertension   . Hypokalemia   . Hypomagnesemia   . Osteoporosis   . Other dysphagia 01/24/2017  . Pneumonia    1980's  . Protein calorie malnutrition ([place])   . Retinal detachment   . Right shoulder injury 05/2002   treated conservatively  . Trigger finger   .  Zenker diverticulum 09/25/2016    PAST SURGICAL HISTORY:  Past Surgical History:  Procedure Laterality Date  . APPENDECTOMY  1949  . CATARACT EXTRACTION EXTRACAPSULAR Right 05/10/2015   Procedure: EXTRACTION CATARACT EXTRACAPSULAR W/INSERTION INTRAOCULAR PROSTHESIS; Surgeon: Doyce Para, MD; [facility]; Service: Ophthalmology; Laterality: Right;  . CHOLECYSTECTOMY  1992  . COLONOSCOPY     03/24/1992, 03/11/1998, 07/08/2002, 05/08/2006, 01/31/2012   PH Crohns disease; no repeat due to age per RTE (dw)  . CORNEAL TRANSPLANT Left 07/08/2012   Procedure: CORNEAL TRANSPLANT DSAEK ** tissue ordered OSI 06/27/2012; Surgeon: Doyce Para, MD; Location: [place]; Service: Ophthalmology;  . ESOPHAGOGASTRODUODENOSCOPY     05/01/2005, 01/31/2012 ; No repeat per RTE  . ESOPHAGOGASTRODUODENOSCOPY (EGD) WITH PROPOFOL N/A 05/30/2018   Procedure: ESOPHAGOGASTRODUODENOSCOPY (EGD) WITH PROPOFOL;  Surgeon: Manya Silvas, MD;  Location: [facility] ENDOSCOPY;  Service: Endoscopy;  Laterality: N/A;  . EYE SURGERY  2012, 2013   5 total  . JOINT REPLACEMENT Right 01-28-13  . LENSECTOMY PHACOFRAGMENTATION WITH ASPIRATION Left 07/25/2011  . TONSILLECTOMY  1950  . TOTAL HIP ARTHROPLASTY Left 12/23/2017   Procedure: TOTAL HIP ARTHROPLASTY;  Surgeon: Dereck Leep, MD;  Location: [facility] ORS;  Service: Orthopedics;  Laterality: Left;  Marland Kitchen VITREOUS RETINAL SURGERY Left 11/07/2011   PPV/SO  . VITREOUS RETINAL SURGERY Left 04/18/2011   TPPV/TRP/SO/SB/EL/C3F8    SOCIAL HISTORY:  Social History   Tobacco Use  . Smoking status: Former Smoker  Packs/day: 0.50    Years: 35.00    Pack years: 17.50    Types: Cigarettes    Last attempt to quit: 01/29/1991    Years since quitting: 27.5  . Smokeless tobacco: Never Used  Substance Use Topics  . Alcohol use: Yes    Comment: 3x/yr    FAMILY HISTORY:  Family History  Problem Relation Age of Onset  . Hypertension Mother   . Stroke Father   . Hypertension Father   .  Arthritis Other   . Prostate cancer Other   . Psoriasis Other   . Stroke Other   . Thyroid disease Other   . Anesthesia problems Neg Hx   . Diabetes Neg Hx   . Glaucoma Neg Hx   . Macular degeneration Neg Hx     DRUG ALLERGIES:  Allergies  Allergen Reactions  . Oxycodone Other (See Comments)    "makes her crazy"    REVIEW OF SYSTEMS:   CONSTITUTIONAL: No fever, fatigue or weakness.  EYES: No blurred or double vision.  EARS, NOSE, AND THROAT: No tinnitus or ear pain.  RESPIRATORY: No cough, shortness of breath, wheezing or hemoptysis.  CARDIOVASCULAR: No chest pain, orthopnea, edema.  GASTROINTESTINAL: No nausea, vomiting, diarrhea or abdominal pain.  GENITOURINARY: No dysuria, hematuria.  ENDOCRINE: No polyuria, nocturia,  HEMATOLOGY: No anemia, easy bruising or bleeding SKIN: No rash or lesion. MUSCULOSKELETAL: Has back pain NEUROLOGIC: No tingling, numbness, weakness.  PSYCHIATRY: No anxiety or depression.   MEDICATIONS AT HOME:  Prior to Admission medications   Medication Sig Start Date End Date Taking? Authorizing Provider  acetaminophen (TYLENOL) 500 MG tablet Take 500 mg by mouth every 8 (eight) hours as needed for mild pain or moderate pain.     [provider]  Amino Acids-Protein Hydrolys (FEEDING SUPPLEMENT, PRO-STAT SUGAR FREE 64,) LIQD Take 30 mLs by mouth 2 (two) times daily.    [provider]  aspirin EC 325 MG tablet Take 325 mg by mouth daily.     [provider]  budesonide (ENTOCORT EC) 3 MG 24 hr capsule Take 9 mg by mouth daily.     [provider]  Calcium Carb-Cholecalciferol (CALCIUM 600 + D PO) Take 1 tablet by mouth 3 (three) times daily.     [provider]  cholecalciferol (VITAMIN D) 1000 units tablet Take 1,000 Units by mouth daily.     [provider]  Coenzyme Q10 (COQ10) 100 MG CAPS Take 2 capsules by mouth daily.    [provider]  gabapentin (NEURONTIN) 100 MG capsule Take 1  capsule by mouth 2 (two) times daily. In AM and at noon 12/10/17   [provider]  gabapentin (NEURONTIN) 300 MG capsule Take 300 mg by mouth at bedtime.    [provider]  HYDROmorphone (DILAUDID) 2 MG tablet Take 0.5 tablets (1 mg total) by mouth every 12 (twelve) hours as needed for severe pain (For pain unrelieved by hydrocodone). 08/07/18 08/07/19  Earleen Newport, MD  iron polysaccharides (NIFEREX) 150 MG capsule Take 150 mg by mouth daily.    [provider]  loperamide (LOPERAMIDE A-D) 2 MG tablet Take 2 mg by mouth 4 (four) times daily as needed for diarrhea or loose stools.    [provider]  Magnesium Oxide 250 MG TABS Take 500 mg by mouth daily.    [provider]  Multiple Vitamin (MULTIVITAMIN) tablet Take 1 tablet by mouth daily.    [provider]  Jonna Munro  40 MG capsule Take 40 mg by mouth daily.  08/26/13   [provider]  Nutritional Supplements (ENSURE ENLIVE PO) Take 1 Bottle by mouth 2 (two) times daily.    [provider]  potassium chloride SA (K-DUR,KLOR-CON) 20 MEQ tablet Take 20 mEq by mouth 2 (two) times daily.    [provider]  prednisoLONE acetate (PRED FORTE) 1 % ophthalmic suspension Place 1 drop into both eyes at bedtime.    [provider]  Probiotic Product (RISA-BID PROBIOTIC) TABS Take 1 tablet by mouth daily.    [provider]  torsemide (DEMADEX) 5 MG tablet Take 5 mg by mouth daily as needed.     [provider]  vitamin B-12 (CYANOCOBALAMIN) 1000 MCG tablet Take 1,000 mcg by mouth daily.    [provider]  vitamin C (ASCORBIC ACID) 500 MG tablet Take 500 mg by mouth daily.    [provider]      PHYSICAL EXAMINATION:   VITAL SIGNS: Blood pressure (!) 149/54, pulse 78, temperature 98.3 F (36.8 C), temperature source Oral, resp. rate 16, SpO2 93 %.  GENERAL:  84 y.o.-year-old patient lying in the bed with no acute  distress.  EYES: Pupils equal, round, reactive to light and accommodation. No scleral icterus. Extraocular muscles intact.  HEENT: Head atraumatic, normocephalic. Oropharynx and nasopharynx clear.  NECK:  Supple, no jugular venous distention. No thyroid enlargement, no tenderness.  LUNGS: Normal breath sounds bilaterally, no wheezing, rales,rhonchi or crepitation. No use of accessory muscles of respiration.  CARDIOVASCULAR: S1, S2 normal. No murmurs, rubs, or gallops.  ABDOMEN: Soft, nontender, nondistended. Bowel sounds present. No organomegaly or mass.  EXTREMITIES: No pedal edema, cyanosis, or clubbing.  Low back pain NEUROLOGIC: Cranial nerves II through XII are intact. Muscle strength 5/5 in all extremities. Sensation intact. Gait not checked.  PSYCHIATRIC: The patient is alert and oriented x 3.  SKIN: No obvious rash, lesion, or ulcer.   LABORATORY PANEL:   CBC Recent Labs  Lab 08/07/18 1628  WBC 12.7*  HGB 13.0  HCT 41.2  PLT 445*  MCV 92.2  MCH 29.1  MCHC 31.6  RDW 13.7   ------------------------------------------------------------------------------------------------------------------  Chemistries  Recent Labs  Lab 08/07/18 1628  NA 133*  K 4.8  CL 98  CO2 25  GLUCOSE 118*  BUN 28*  CREATININE 0.88  CALCIUM 9.0   ------------------------------------------------------------------------------------------------------------------ estimated creatinine clearance is 43.5 mL/min (by C-G formula based on SCr of 0.88 mg/dL). ------------------------------------------------------------------------------------------------------------------ No results for input(s): TSH, T4TOTAL, T3FREE, THYROIDAB in the last 72 hours.  Invalid input(s): FREET3   Coagulation profile No results for input(s): INR, PROTIME in the last 168 hours. ------------------------------------------------------------------------------------------------------------------- No results for input(s):  DDIMER in the last 72 hours. -------------------------------------------------------------------------------------------------------------------  Cardiac Enzymes No results for input(s): CKMB, TROPONINI, MYOGLOBIN in the last 168 hours.  Invalid input(s): CK ------------------------------------------------------------------------------------------------------------------ Invalid input(s): POCBNP  ---------------------------------------------------------------------------------------------------------------  Urinalysis    Component Value Date/Time   COLORURINE STRAW (A) 07/31/2018 2013   APPEARANCEUR CLEAR (A) 07/31/2018 2013   APPEARANCEUR Clear 01/12/2013 1321   LABSPEC 1.006 07/31/2018 2013   LABSPEC 1.011 01/12/2013 1321   PHURINE 7.0 07/31/2018 2013   GLUCOSEU NEGATIVE 07/31/2018 2013   GLUCOSEU Negative 01/12/2013 1321   HGBUR NEGATIVE 07/31/2018 2013   [place] NEGATIVE 07/31/2018 2013   BILIRUBINUR Negative 01/12/2013 1321   [place] 07/31/2018 2013   PROTEINUR NEGATIVE 07/31/2018 2013   NITRITE NEGATIVE 07/31/2018 2013   LEUKOCYTESUR NEGATIVE 07/31/2018 2013   LEUKOCYTESUR Negative  01/12/2013 1321     RADIOLOGY: No results found.  EKG: Orders placed or performed during the hospital encounter of 04/01/17  . ED EKG  . ED EKG    IMPRESSION AND PLAN:  84 year old elderly female patient with history of spinal stenosis, osteoporosis, arthritis, GERD, hyperlipidemia, hypertension referred by primary care physician office for back pain and gait imbalance  -Subacute vertebral compression fracture Orthopedic surgery consultation with Dr. Rudene Christians for kyphoplasty  -Intractable back pain secondary to vertebral compression fracture Patient might need orthopedic brace to the back Pain management with oral tramadol and IV Dilaudid as needed  -Ambulatory dysfunction secondary to back pain and vertebral compression fracture  Once seen by orthopedics and cleared  physical therapy evaluation  -Osteoporosis Calcium supplementation  -DVT prophylaxis subcu Lovenox daily  All the records are reviewed and case discussed with ED provider. Management plans discussed with the patient, family and they are in agreement.  CODE STATUS:DNR    Code Status Orders  (From admission, onward)         Start     Ordered   08/11/18 1738  Do not attempt resuscitation (DNR)  Continuous    Question Answer Comment  In the event of cardiac or respiratory ARREST Do not call a "code blue"   In the event of cardiac or respiratory ARREST Do not perform Intubation, CPR, defibrillation or ACLS   In the event of cardiac or respiratory ARREST Use medication by any route, position, wound care, and other measures to relive pain and suffering. May use oxygen, suction and manual treatment of airway obstruction as needed for comfort.      08/11/18 1737        Code Status History    Date Active Date Inactive Code Status Order ID Comments User Context   08/11/2018 1735 08/11/2018 1736 Full Code [id number]  Saundra Shelling, MD Inpatient   12/23/2017 1137 12/25/2017 1808 Full Code [id number]  Dereck Leep, MD Inpatient   01/22/2017 1502 01/23/2017 1413 Full Code [id number]  Hillary Bow, MD ED   02/21/2015 1939 02/23/2015 1908 Full Code [id number]  Loletha Grayer, MD ED       TOTAL TIME TAKING CARE OF THIS PATIENT: 53 minutes.    Saundra Shelling M.D on 08/11/2018 at 5:39 PM  Between 7am to 6pm - Pager - [phone]  After 6pm go to [email or web address] - password [email or web address] [facility]  Office  [phone]  CC: Primary care physician; Tracie Harrier, MD

## 2018-08-11 NOTE — Progress Notes (Signed)
Called by nurse that patient and daughter wants the patent to be a FULL CODE.    Will change code status.

## 2018-08-12 ENCOUNTER — Observation Stay: Payer: Medicare Other | Admitting: Anesthesiology

## 2018-08-12 ENCOUNTER — Encounter
Admission: AD | Disposition: A | Discharge status: Skilled Nursing Facility | Payer: Self-pay | Source: Ambulatory Visit | Attending: Internal Medicine

## 2018-08-12 ENCOUNTER — Observation Stay: Payer: Medicare Other

## 2018-08-12 DIAGNOSIS — S32030A Wedge compression fracture of third lumbar vertebra, initial encounter for closed fracture: Secondary | ICD-10-CM | POA: Diagnosis not present

## 2018-08-12 HISTORY — PX: KYPHOPLASTY: SHX5884

## 2018-08-12 LAB — PROTIME-INR
INR: 0.85
Prothrombin Time: 11.6 seconds (ref 11.4–15.2)

## 2018-08-12 LAB — SURGICAL PCR SCREEN
MRSA, PCR: NEGATIVE
Staphylococcus aureus: NEGATIVE

## 2018-08-12 SURGERY — KYPHOPLASTY
Anesthesia: General | Site: Back

## 2018-08-12 MED ORDER — FENTANYL CITRATE (PF) 100 MCG/2ML IJ SOLN
INTRAMUSCULAR | Status: DC | PRN
  Administered 2018-08-12: 25 ug via INTRAVENOUS
  Administered 2018-08-12: 50 ug via INTRAVENOUS
  Administered 2018-08-12: 25 ug via INTRAVENOUS

## 2018-08-12 MED ORDER — LACTATED RINGERS IV SOLN
INTRAVENOUS | Status: DC
  Administered 2018-08-12: 16:00:00 via INTRAVENOUS

## 2018-08-12 MED ORDER — MAGNESIUM HYDROXIDE 400 MG/5ML PO SUSP: 30.0000 mL | Freq: Every day | ORAL | Status: DC | PRN

## 2018-08-12 MED ORDER — ONDANSETRON HCL 4 MG PO TABS: 4.0000 mg | ORAL_TABLET | Freq: Four times a day (QID) | ORAL | Status: DC | PRN

## 2018-08-12 MED ORDER — MAGNESIUM CITRATE PO SOLN
1.0000 | Freq: Once | ORAL | Status: DC | PRN
  Filled 2018-08-12: qty 296

## 2018-08-12 MED ORDER — LIDOCAINE HCL 1 % IJ SOLN
INTRAMUSCULAR | Status: DC | PRN
  Administered 2018-08-12: 12.5 mL

## 2018-08-12 MED ORDER — PROPOFOL 10 MG/ML IV BOLUS
INTRAVENOUS | Status: AC
  Filled 2018-08-12: qty 20

## 2018-08-12 MED ORDER — DOCUSATE SODIUM 100 MG PO CAPS: 100.0000 mg | ORAL_CAPSULE | Freq: Two times a day (BID) | ORAL | Status: DC

## 2018-08-12 MED ORDER — FENTANYL CITRATE (PF) 100 MCG/2ML IJ SOLN
INTRAMUSCULAR | Status: AC
  Administered 2018-08-12: 25 ug via INTRAVENOUS
  Filled 2018-08-12: qty 2

## 2018-08-12 MED ORDER — BISACODYL 5 MG PO TBEC: 5.0000 mg | DELAYED_RELEASE_TABLET | Freq: Every day | ORAL | Status: DC | PRN

## 2018-08-12 MED ORDER — METOCLOPRAMIDE HCL 5 MG/ML IJ SOLN: 5.0000 mg | Freq: Three times a day (TID) | INTRAMUSCULAR | Status: DC | PRN

## 2018-08-12 MED ORDER — CEFAZOLIN SODIUM-DEXTROSE 1-4 GM/50ML-% IV SOLN
INTRAVENOUS | Status: AC
  Filled 2018-08-12: qty 50

## 2018-08-12 MED ORDER — LACTATED RINGERS IV SOLN
INTRAVENOUS | Status: DC
  Administered 2018-08-12: 13:00:00 via INTRAVENOUS

## 2018-08-12 MED ORDER — BUPIVACAINE-EPINEPHRINE (PF) 0.5% -1:200000 IJ SOLN
INTRAMUSCULAR | Status: DC | PRN
  Administered 2018-08-12: 12.5 mL via PERINEURAL

## 2018-08-12 MED ORDER — PROPOFOL 500 MG/50ML IV EMUL
INTRAVENOUS | Status: DC | PRN
  Administered 2018-08-12: 75 ug/kg/min via INTRAVENOUS

## 2018-08-12 MED ORDER — GABAPENTIN 100 MG PO CAPS
100.0000 mg | ORAL_CAPSULE | Freq: Two times a day (BID) | ORAL | Status: DC
  Administered 2018-08-12 – 2018-08-13 (×3): 100 mg via ORAL
  Filled 2018-08-12 (×4): qty 1

## 2018-08-12 MED ORDER — FENTANYL CITRATE (PF) 100 MCG/2ML IJ SOLN
INTRAMUSCULAR | Status: AC
  Filled 2018-08-12: qty 2

## 2018-08-12 MED ORDER — ONDANSETRON HCL 4 MG/2ML IJ SOLN: 4.0000 mg | Freq: Once | INTRAMUSCULAR | Status: DC | PRN

## 2018-08-12 MED ORDER — FENTANYL CITRATE (PF) 100 MCG/2ML IJ SOLN
25.0000 ug | INTRAMUSCULAR | Status: DC | PRN
  Administered 2018-08-12: 25 ug via INTRAVENOUS

## 2018-08-12 MED ORDER — ONDANSETRON HCL 4 MG/2ML IJ SOLN: 4.0000 mg | Freq: Four times a day (QID) | INTRAMUSCULAR | Status: DC | PRN

## 2018-08-12 MED ORDER — METOCLOPRAMIDE HCL 10 MG PO TABS: 5.0000 mg | ORAL_TABLET | Freq: Three times a day (TID) | ORAL | Status: DC | PRN

## 2018-08-12 SURGICAL SUPPLY — 17 items

## 2018-08-12 NOTE — Care Management Note (Signed)
Case Management Note  Patient Details  Name: Joanne [name] MRN: [id number] Date of Birth: [birth year]  Subjective/Objective:                  RNCM met with patient to provide list of home health agencies per CMS.gov with 3-5 star ratings. She states [facility] past and was happy with them.  She states she hopes to go to [facility] at discharge.  She is baseline independent at home alone.  Her children work during the day. She has one daughter that has been going to patients home every morning to "get her out of the bed because she hasn't been able to manage that in recent weeks".  She has available at home per previous surgeries a bedside commode, a rollator, a cane and a rolling walker. She uses [facility] without issue. Her PCP is Dr. Ginette Pitman with [facility].  Action/Plan: Referral to [facility] with [facility]. CSW updated. [place] letter explained based on [employer]   Expected Discharge Date:  08/14/18               Expected Discharge Plan:     In-House Referral:  Clinical Social Work  Discharge planning Services  CM Consult  Post Acute Care Choice:  Home Health Choice offered to:  Patient  DME Arranged:    DME Agency:     HH Arranged:    [place] Agency:  [place]  Status of Service:  In process, will continue to follow  If discussed at Long Length of Stay Meetings, dates discussed:    Additional Comments:  Marshell Garfinkel, RN 08/12/2018, 10:54 AM

## 2018-08-12 NOTE — Care Management Obs Status (Signed)
[place] NOTIFICATION   Patient Details  Name: Joanne [name] MRN: [id number] Date of Birth: [birth year]   Medicare Observation Status Notification Given:  Yes    Marshell Garfinkel, RN 08/12/2018, 9:49 AM

## 2018-08-12 NOTE — Progress Notes (Signed)
Patient ID: Joanne [name], female   DOB: [birth year], 84 y.o.   MRN: [id number]  [employer] PROGRESS NOTE  KIKUYE KORENEK DPO:[id number] DOB: 1930/12/28 DOA: 08/11/2018 PCP: Tracie Harrier, MD  HPI/Subjective: Patient states that she has had back pain since November 21.  Prior to that she felt okay.  She yesterday had a steroid injection on her back.  No help with her pain at this point.  Objective: Vitals:   08/12/18 0826 08/12/18 1221  BP: (!) 139/43 (!) 108/97  Pulse: 62 62  Resp: 18 18  Temp: (!) 97.5 F (36.4 C) 97.8 F (36.6 C)  SpO2: 99% 100%     ROS: Review of Systems  Constitutional: Positive for malaise/fatigue. Negative for chills and fever.  Eyes: Negative for blurred vision.  Respiratory: Negative for cough and shortness of breath.   Cardiovascular: Negative for chest pain.  Gastrointestinal: Negative for abdominal pain, constipation, diarrhea, nausea and vomiting.  Genitourinary: Negative for dysuria.  Musculoskeletal: Positive for back pain. Negative for joint pain.  Neurological: Positive for weakness. Negative for dizziness and headaches.   Exam: Physical Exam  Constitutional: She is oriented to person, place, and time.  HENT:  Nose: No mucosal edema.  Mouth/Throat: No oropharyngeal exudate or posterior oropharyngeal edema.  Eyes: Pupils are equal, round, and reactive to light. Conjunctivae, EOM and lids are normal.  Neck: No JVD present. Carotid bruit is not present. No edema present. No thyroid mass and no thyromegaly present.  Cardiovascular: S1 normal and S2 normal. Exam reveals no gallop.  No murmur heard. Pulses:      Dorsalis pedis pulses are 2+ on the right side and 2+ on the left side.  Respiratory: No respiratory distress. She has no wheezes. She has no rhonchi. She has no rales.  GI: Soft. Bowel sounds are normal. There is no abdominal tenderness.  Musculoskeletal:     Right ankle: She exhibits no swelling.     Left ankle: She  exhibits no swelling.  Lymphadenopathy:    She has no cervical adenopathy.  Neurological: She is alert and oriented to person, place, and time. No cranial nerve deficit.  Able to straight leg raise bilaterally  Skin: Skin is warm. No rash noted. Nails show no clubbing.  Psychiatric: She has a normal mood and affect.      Data Reviewed: Basic Metabolic Panel: Recent Labs  Lab 08/07/18 1628 08/11/18 1910  NA 133* 135  K 4.8 4.5  CL 98 104  CO2 25 23  GLUCOSE 118* 235*  BUN 28* 23  CREATININE 0.88 0.71  CALCIUM 9.0 8.5*   Liver Function Tests: Recent Labs  Lab 08/11/18 1910  AST 19  ALT 17  ALKPHOS 61  BILITOT 0.4  PROT 5.6*  ALBUMIN 2.7*   CBC: Recent Labs  Lab 08/07/18 1628  WBC 12.7*  HGB 13.0  HCT 41.2  MCV 92.2  PLT 445*     Recent Results (from the past 240 hour(s))  Surgical pcr screen     Status: None   Collection Time: 08/12/18  3:09 AM  Result Value Ref Range Status   MRSA, PCR NEGATIVE NEGATIVE Final   Staphylococcus aureus NEGATIVE NEGATIVE Final    Comment: (NOTE) The Xpert SA Assay (FDA approved for NASAL specimens in patients 85 years of age and older), is one component of a comprehensive surveillance program. It is not intended to diagnose infection nor to guide or monitor treatment. Performed at [facility], [place],  [place], [place] [address]      Scheduled Meds: . [MAR Hold] aspirin EC  325 mg Oral Daily  . [MAR Hold] budesonide  9 mg Oral Daily  . [MAR Hold] calcium carbonate  1 tablet Oral BID WC  . [MAR Hold] cholecalciferol  1,000 Units Oral Daily  . [MAR Hold] feeding supplement (ENSURE ENLIVE)  237 mL Oral BID BM  . [MAR Hold] feeding supplement (PRO-STAT SUGAR FREE 64)  30 mL Oral BID  . [MAR Hold] gabapentin  300 mg Oral QHS  . [MAR Hold] iron polysaccharides  150 mg Oral Daily  . [MAR Hold] lactobacillus acidophilus  1 tablet Oral Daily  . [MAR Hold] magnesium oxide  400 mg Oral Daily  . [MAR Hold]  multivitamin with minerals  1 tablet Oral Daily  . [MAR Hold] pantoprazole  40 mg Oral Daily  . [MAR Hold] prednisoLONE acetate  1 drop Both Eyes QHS  . [MAR Hold] sodium chloride flush  3 mL Intravenous Q12H  . [MAR Hold] vitamin B-12  1,000 mcg Oral Daily  . [MAR Hold] vitamin C  500 mg Oral Daily   Continuous Infusions: . [MAR Hold] sodium chloride    . ceFAZolin    . [MAR Hold]  ceFAZolin (ANCEF) IV    . lactated ringers    . lactated ringers 20 mL/hr at 08/12/18 1242    Assessment/Plan:  1. Subacute vertebral compression fracture of the lumbar spine.  Dr. Rudene Christians to do a kyphoplasty today.  Physical therapy evaluation after kyphoplasty. 2. Intractable back pain and pain down the right leg.  Patient also does have spinal stenosis on MRI.  We will see how she does with her pain after the kyphoplasty. 3. GERD on PPI 4. History of Crohn's disease and collagen vascular disease  Code Status:     Code Status Orders  (From admission, onward)         Start     Ordered   08/11/18 1855  Full code  Continuous     08/11/18 1854        Code Status History    Date Active Date Inactive Code Status Order ID Comments User Context   08/11/2018 1737 08/11/2018 1854 DNR [id number]  Saundra Shelling, MD Inpatient   08/11/2018 1735 08/11/2018 1736 Full Code [id number]  Saundra Shelling, MD Inpatient   12/23/2017 1137 12/25/2017 1808 Full Code [id number]  Dereck Leep, MD Inpatient   01/22/2017 1502 01/23/2017 1413 Full Code [id number]  Hillary Bow, MD ED   02/21/2015 1939 02/23/2015 1908 Full Code [id number]  Loletha Grayer, MD ED    Advance Directive Documentation     Most Recent Value  Type of Advance Directive  Healthcare Power of Attorney, Living will  Pre-existing out of facility DNR order (yellow form or pink MOST form)  -  "MOST" Form in Place?  -     Disposition Plan: To be determined  Consultants:  Orthopedic surgery  Procedures:  Kyphoplasty  Time spent: 28  minutes  [place]

## 2018-08-12 NOTE — Transfer of Care (Signed)
Immediate Anesthesia Transfer of Care Note  Patient: Joanne [name]  Procedure(s) Performed: KYPHOPLASTY L3 (N/A Back)  Patient Location: PACU  Anesthesia Type:General  Level of Consciousness: awake and alert   Airway & Oxygen Therapy: Patient Spontanous Breathing  Post-op Assessment: Report given to RN and Post -op Vital signs reviewed and stable  Post vital signs: Reviewed and stable  Last Vitals:  Vitals Value Taken Time  BP 128/55 08/12/2018  3:27 PM  Temp    Pulse 76 08/12/2018  3:27 PM  Resp 14 08/12/2018  3:27 PM  SpO2 93 % 08/12/2018  3:27 PM  Vitals shown include unvalidated device data.  Last Pain:  Vitals:   08/12/18 1221  TempSrc: Oral  PainSc: 8          Complications: No apparent anesthesia complications

## 2018-08-12 NOTE — Clinical Social Work Placement (Signed)
   CLINICAL SOCIAL WORK PLACEMENT  NOTE  Date:  08/12/2018  Patient Details  Name: Joanne [name] MRN: [id number] Date of Birth: 06-06-1931  Clinical Social Work is seeking post-discharge placement for this patient at the [place] level of care (*CSW will initial, date and re-position this form in  chart as items are completed):  Yes   Patient/family provided with [place] Work Department's list of facilities offering this level of care within the geographic area requested by the patient (or if unable, by the patient's family).  Yes   Patient/family informed of their freedom to choose among providers that offer the needed level of care, that participate in Medicare, Medicaid or managed care program needed by the patient, have an available bed and are willing to accept the patient.  Yes   Patient/family informed of [place]'s ownership interest in [facility] and [facility], as well as of the fact that they are under no obligation to receive care at these facilities.  PASRR submitted to EDS on       PASRR number received on       Existing PASRR number confirmed on 08/12/18     FL2 transmitted to all facilities in geographic area requested by pt/family on 08/12/18     FL2 transmitted to all facilities within larger geographic area on       Patient informed that his/her managed care company has contracts with or will negotiate with certain facilities, including the following:            Patient/family informed of bed offers received.  Patient chooses bed at       Physician recommends and patient chooses bed at      Patient to be transferred to   on  .  Patient to be transferred to facility by       Patient family notified on   of transfer.  Name of family member notified:        PHYSICIAN       Additional Comment:    _______________________________________________ Luxe Cuadros, Veronia Beets, LCSW 08/12/2018, 12:00 PM

## 2018-08-12 NOTE — NC FL2 (Signed)
[place] LEVEL OF CARE SCREENING TOOL     IDENTIFICATION  Patient Name: Joanne [name] Birthdate: 24-Aug-1931 Sex: female Admission Date (Current Location): 08/11/2018  [place] and [place] Number:  Engineering geologist and Address:  [facility], [address] [place], [place] [address]      Provider Number: [id number]  Attending Physician Name and Address:  Loletha Grayer, MD  Relative Name and Phone Number:       Current Level of Care: Hospital Recommended Level of Care: [place] Prior Approval Number:    Date Approved/Denied:   PASRR Number: [id number])  Discharge Plan: SNF    Current Diagnoses: Patient Active Problem List   Diagnosis Date Noted  . Vertebral compression fracture ([place]) 08/11/2018  . Avascular necrosis of bone of hip, left ([place]) 01/06/2018  . Anemia, unspecified 12/23/2017  . Crohn's disease ([place]) 12/23/2017  . Fuchs' corneal dystrophy 12/23/2017  . Hyperlipidemia, unspecified 12/23/2017  . Status post total replacement of hip 12/23/2017  . Benign essential HTN 12/16/2017  . Dyspnea on exertion 12/02/2017  . Protein-calorie malnutrition ([place]) 04/21/2017  . Age-related osteoporosis with current pathological fracture with routine healing 04/03/2017  . Other dysphagia 01/24/2017  . Hypokalemia 01/22/2017  . Zenker diverticulum 09/25/2016  . History of hypertension 05/03/2015  . Hyponatremia 02/21/2015  . Infected sebaceous cyst 11/10/2013  . Nuclear sclerotic cataract of right eye 09/08/2012  . Right shoulder injury 05/27/2002    Orientation RESPIRATION BLADDER Height & Weight     Self, Time, Situation, Place  Normal Continent Weight:   Height:     BEHAVIORAL SYMPTOMS/MOOD NEUROLOGICAL BOWEL NUTRITION STATUS      Continent Diet(Diet: NPO for surgery to be advanced. )  AMBULATORY STATUS COMMUNICATION OF NEEDS Skin   Extensive Assist Verbally Surgical wounds(Incision: Back )                        Personal Care Assistance Level of Assistance  Bathing, Feeding, Dressing Bathing Assistance: Limited assistance Feeding assistance: Independent Dressing Assistance: Limited assistance     Functional Limitations Info  Sight, Hearing, Speech Sight Info: Adequate Hearing Info: Adequate Speech Info: Adequate    SPECIAL CARE FACTORS FREQUENCY  PT (By licensed PT), OT (By licensed OT)     PT Frequency: (5) OT Frequency: (5)            Contractures      Additional Factors Info  Code Status, Allergies Code Status Info: (Full Code. ) Allergies Info: (Oxycodone)           Current Medications (08/12/2018):  This is the current hospital active medication list Current Facility-Administered Medications  Medication Dose Route Frequency Provider Last Rate Last Dose  . 0.9 %  sodium chloride infusion  250 mL Intravenous PRN Pyreddy, Reatha Harps, MD      . acetaminophen (TYLENOL) tablet 650 mg  650 mg Oral Q6H PRN Pyreddy, Reatha Harps, MD       Or  . acetaminophen (TYLENOL) suppository 650 mg  650 mg Rectal Q6H PRN Saundra Shelling, MD      . aspirin EC tablet 325 mg  325 mg Oral Daily Pyreddy, Pavan, MD      . budesonide (ENTOCORT EC) 24 hr capsule 9 mg  9 mg Oral Daily Pyreddy, Pavan, MD      . calcium carbonate (TUMS - dosed in mg elemental calcium) chewable tablet 200 mg of elemental calcium  1 tablet Oral BID WC  Saundra Shelling, MD      . ceFAZolin (ANCEF) IVPB 1 g/50 mL premix  1 g Intravenous Once Hessie Knows, MD      . cholecalciferol (VITAMIN D) tablet 1,000 Units  1,000 Units Oral Daily Pyreddy, Pavan, MD      . feeding supplement (ENSURE ENLIVE) (ENSURE ENLIVE) liquid 237 mL  237 mL Oral BID BM Pyreddy, Pavan, MD      . feeding supplement (PRO-STAT SUGAR FREE 64) liquid 30 mL  30 mL Oral BID Saundra Shelling, MD   30 mL at 08/11/18 2056  . gabapentin (NEURONTIN) capsule 300 mg  300 mg Oral QHS Saundra Shelling, MD   300 mg at 08/11/18 2055  . HYDROmorphone (DILAUDID)  injection 0.5 mg  0.5 mg Intravenous Q4H PRN Saundra Shelling, MD   0.5 mg at 08/11/18 1802  . iron polysaccharides (NIFEREX) capsule 150 mg  150 mg Oral Daily Pyreddy, Pavan, MD      . lactobacillus acidophilus (BACID) tablet 1 tablet  1 tablet Oral Daily Pyreddy, Pavan, MD      . loperamide (IMODIUM) capsule 2 mg  2 mg Oral QID PRN Pyreddy, Reatha Harps, MD      . magnesium oxide (MAG-OX) tablet 400 mg  400 mg Oral Daily Nesbitt, Christopher A, RPH      . multivitamin with minerals tablet 1 tablet  1 tablet Oral Daily Pyreddy, Pavan, MD      . ondansetron (ZOFRAN) tablet 4 mg  4 mg Oral Q6H PRN Pyreddy, Reatha Harps, MD       Or  . ondansetron (ZOFRAN) injection 4 mg  4 mg Intravenous Q6H PRN Pyreddy, Pavan, MD      . pantoprazole (PROTONIX) EC tablet 40 mg  40 mg Oral Daily Pyreddy, Pavan, MD      . prednisoLONE acetate (PRED FORTE) 1 % ophthalmic suspension 1 drop  1 drop Both Eyes QHS Pyreddy, Reatha Harps, MD   1 drop at 08/11/18 2056  . senna-docusate (Senokot-S) tablet 1 tablet  1 tablet Oral QHS PRN Pyreddy, Pavan, MD      . sodium chloride flush (NS) 0.9 % injection 3 mL  3 mL Intravenous Q12H Pyreddy, Pavan, MD      . sodium chloride flush (NS) 0.9 % injection 3 mL  3 mL Intravenous PRN Pyreddy, Pavan, MD      . traMADol (ULTRAM) tablet 50 mg  50 mg Oral Q6H PRN Pyreddy, Pavan, MD      . vitamin B-12 (CYANOCOBALAMIN) tablet 1,000 mcg  1,000 mcg Oral Daily Pyreddy, Pavan, MD      . vitamin C (ASCORBIC ACID) tablet 500 mg  500 mg Oral Daily Pyreddy, Reatha Harps, MD         Discharge Medications: Please see discharge summary for a list of discharge medications.  Relevant Imaging Results:  Relevant Lab Results:   Additional Information (SSN: [id number])  [place], Veronia Beets, LCSW

## 2018-08-12 NOTE — Op Note (Signed)
Date  12:17/19  Time 3:35 pm   PATIENT: Joanne [name]   PRE-OPERATIVE DIAGNOSIS:  closed wedge compression fracture of L3   POST-OPERATIVE DIAGNOSIS:  closed wedge compression fracture of L3   PROCEDURE:  Procedure(s): KYPHOPLASTY L3  SURGEON: Laurene Footman, MD   ASSISTANTS: None   ANESTHESIA:   local and MAC   EBL:  No intake/output data recorded.   BLOOD ADMINISTERED:none   DRAINS: none    LOCAL MEDICATIONS USED:  MARCAINE    and XYLOCAINE    SPECIMEN:  L3 vertebral body   DISPOSITION OF SPECIMEN: pathology   COUNTS:  YES   TOURNIQUET:  * No tourniquets in log *   IMPLANTS: Bone cement   DICTATION: .Dragon Dictation  patient was brought to the operating room and after adequate anesthesia was obtained the patient was placed prone.  C arm was brought in in good visualization of the affected level obtained on both AP and lateral projections.  After patient identification and timeout procedures were completed, local anesthetic was infiltrated with 10 cc 1% Xylocaine infiltrated subcutaneously.  This is done the area on the right side of the planned approach.  The back was then prepped and draped usual sterile manner and repeat timeout procedure carried out.  A spinal needle was brought down to the pedicle on the right side of L3 and a 50-50 mix of 1% Xylocaine half percent Sensorcaine with epinephrine total of 20 cc injected.  After allowing this to set a small incision was made and the trocar was advanced into the vertebral body in an extrapedicular fashion.  Biopsy was obtained  Drilling was carried out balloon inserted with inflation to 2 cc.  When the cement was appropriate consistency 6 cc were injected into the vertebral body without extravasation, good fill superior to inferior endplates and from right to left sides along the inferior endplate.  After the cement had set the trochar was removed and permanent C-arm views obtained.  The wound was closed with Dermabond followed  by [place]:  continue as observation   PATIENT DISPOSITION:  PACU - hemodynamically stable.

## 2018-08-12 NOTE — Consult Note (Signed)
Patient is an 84 year old whose had onset of severe pain in the back and into the hips in mid November after taking a few steps at home.  She has had multiple visits to the emergency room and had epidural steroid injection yesterday with Dr. Sharlet Salina subsequent MRI showing compression fracture.  She has been having a great deal of difficulty walking since then.  She has significant low back pain and pain radiating down into the legs.  Examination she is tender to percussion at L3.  She does not have any clonus she does have pain that radiates into the lower legs predominantly into the hip and knee.  MRI is reviewed with her family showing L3 compression fracture as well as significant spinal stenosis at that level.  Impression is #1.  Compression fracture at L3 giving significant back pain, #2 is spinal stenosis probably giving her a great deal of her leg pain.  Augmentation is for L3 kyphoplasty to get her more mobile.  I did discuss that this will not help her spinal stenosis but should not worsen it either.  Reviewed the procedure in detail with patient and her family.  Plan on surgery later today with site marked.

## 2018-08-12 NOTE — Anesthesia Preprocedure Evaluation (Signed)
Anesthesia Evaluation  Patient identified by MRN, date of birth, ID band Patient awake    Reviewed: Allergy & Precautions, NPO status , Patient's Chart, lab work & pertinent test results  History of Anesthesia Complications Negative for: history of anesthetic complications  Airway Mallampati: III  TM Distance: >3 FB Neck ROM: Full    Dental no notable dental hx.    Pulmonary neg sleep apnea, neg COPD, former smoker,    breath sounds clear to auscultation- rhonchi (-) wheezing      Cardiovascular hypertension, Pt. on medications (-) CAD, (-) Past MI, (-) Cardiac Stents and (-) CABG  Rhythm:Regular Rate:Normal - Systolic murmurs and - Diastolic murmurs    Neuro/Psych neg Seizures negative neurological ROS  negative psych ROS   GI/Hepatic Neg liver ROS, GERD  ,  Endo/Other  negative endocrine ROSneg diabetes  Renal/GU negative Renal ROS     Musculoskeletal  (+) Arthritis ,   Abdominal (+) + obese,   Peds  Hematology  (+) anemia ,   Anesthesia Other Findings Past Medical History: No date: Anemia, unspecified     Comment:  B12 and iron deficiency  No date: Arthritis No date: B12 deficiency No date: Collagen vascular disease (HCC) No date: Crohn disease ([place]) No date: Crohn's disease ([place])     Comment:  followed by Dr. Vira Agar with serial colonoscopies No date: Dyspnea No date: Vira Agar' endothelial dystrophy No date: GERD (gastroesophageal reflux disease) 09/01/2012: History of bone density study No date: History of spinal fracture No date: Hyperlipidemia, unspecified No date: Hypertension No date: Hypokalemia No date: Hypomagnesemia No date: Osteoporosis 01/24/2017: Other dysphagia No date: Pneumonia     Comment:  1980's No date: Protein calorie malnutrition (HCC) No date: Retinal detachment 05/2002: Right shoulder injury     Comment:  treated conservatively No date: Trigger finger 09/25/2016: Zenker  diverticulum   Reproductive/Obstetrics                             Anesthesia Physical Anesthesia Plan  ASA: III  Anesthesia Plan: General   Post-op Pain Management:    Induction: Intravenous  PONV Risk Score and Plan: 2 and Propofol infusion  Airway Management Planned: Natural Airway  Additional Equipment:   Intra-op Plan:   Post-operative Plan:   Informed Consent: I have reviewed the patients History and Physical, chart, labs and discussed the procedure including the risks, benefits and alternatives for the proposed anesthesia with the patient or authorized representative who has indicated his/her understanding and acceptance.   Dental advisory given  Plan Discussed with: CRNA and Anesthesiologist  Anesthesia Plan Comments:         Anesthesia Quick Evaluation

## 2018-08-12 NOTE — Progress Notes (Signed)
PT Cancellation Note  Patient Details Name: Joanne [name] MRN: [id number] DOB: 04/26/1931   Cancelled Treatment:    Reason Eval/Treat Not Completed:  Paged and spoke with surgeon ~1700 regarding initiation of PT eval, per verbal request nursing to get pt to sitting EOB this evening, but PT to wait until tomorrow.  (AM order stated to start PT this afternoon after kyphoplasty)  Kreg Shropshire, DPT 08/12/2018, 5:10 PM

## 2018-08-12 NOTE — Anesthesia Post-op Follow-up Note (Signed)
Anesthesia QCDR form completed.        

## 2018-08-12 NOTE — Clinical Social Work Note (Signed)
Clinical Social Work Assessment  Patient Details  Name: Joanne [name] MRN: [id number] Date of Birth: 09-Jan-1931  Date of referral:  08/12/18               Reason for consult:  Facility Placement                Permission sought to share information with:  Chartered certified accountant granted to share information::  Yes, Verbal Permission Granted  Name::      [place]::   [place]   Relationship::     Contact Information:     Housing/Transportation Living arrangements for the past 2 months:  [place] of Information:  Patient Patient Interpreter Needed:  None Criminal Activity/Legal Involvement Pertinent to Current Situation/Hospitalization:  No - Comment as needed Significant Relationships:  Adult Children Lives with:  Self Do you feel safe going back to the place where you live?  Yes Need for family participation in patient care:  Yes (Comment)  Care giving concerns:  Patient lives alone in [place].    Social Worker assessment / plan:  Holiday representative (CSW) reviewed chart and noted that patient has a compression fracture. Per RN patient will have a kyphoplasty today. PT is pending. CSW met with patient alone at bedside to discuss D/C plan prior to kyphoplasty today. Patient was alert and oriented X4 and was laying in the bed. CSW introduced self and explained role of CSW department. Per patient she lives alone in [place] and has 2 adult daughter Joanne [name] and Joanne [name] and 1 adult son. CSW explained that PT will evaluate patient after kyphoplasty and make a recommendation of home health or SNF. Patient prefers SNF and requested [facility]. CSW explained that [facility] will have to approve SNF. Patient verbalized her understanding and reported that she has been to [facility] in the past. Patient is okay with sharing a room at [facility] if needed. Per patient the last time she was in rehab at in April 2019 at [facility]. FL2  complete and faxed out. CSW will continue follow and assist as needed.   Employment status:  Disabled (Comment on whether or not currently receiving Disability), Retired Nurse, adult PT Recommendations:  Not assessed at this time Information / Referral to community resources:  [place]  Patient/Family's Response to care:  Patient requested to go to [facility] for rehab.   Patient/Family's Understanding of and Emotional Response to Diagnosis, Current Treatment, and Prognosis:  Patient was very pleasant and thanked CSW for assistance.   Emotional Assessment Appearance:  Appears stated age Attitude/Demeanor/Rapport:    Affect (typically observed):  Accepting, Adaptable, Pleasant Orientation:  Oriented to Self, Oriented to Place, Oriented to Situation, Oriented to  Time Alcohol / Substance use:  Not Applicable Psych involvement (Current and /or in the community):  No (Comment)  Discharge Needs  Concerns to be addressed:  Discharge Planning Concerns Readmission within the last 30 days:  No Current discharge risk:  Dependent with Mobility, Chronically ill Barriers to Discharge:  Continued Medical Work up   [employer], [place] Beets, LCSW 08/12/2018, 12:01 PM

## 2018-08-12 NOTE — Anesthesia Postprocedure Evaluation (Signed)
Anesthesia Post Note  Patient: ROWYNN MCWEENEY  Procedure(s) Performed: KYPHOPLASTY L3 (N/A Back)  Patient location during evaluation: PACU Anesthesia Type: General Level of consciousness: awake and alert Pain management: pain level controlled Vital Signs Assessment: post-procedure vital signs reviewed and stable Respiratory status: spontaneous breathing, nonlabored ventilation and respiratory function stable Cardiovascular status: blood pressure returned to baseline and stable Postop Assessment: no signs of nausea or vomiting Anesthetic complications: no     Last Vitals:  Vitals:   08/12/18 1527 08/12/18 1542  BP: (!) 128/55 (!) 128/53  Pulse: 74 62  Resp: 13 14  Temp: (!) 36.3 C   SpO2: 100% 96%    Last Pain:  Vitals:   08/12/18 1545  TempSrc:   PainSc: Asleep                 Abrie Egloff

## 2018-08-12 NOTE — Progress Notes (Signed)
Clinical Education officer, museum (CSW) presented bed offers to patient and her 2 adult daughters [name] and [place]. They chose [facility]. CSW explained that [facility] will have to approve SNF. Per [facility] admissions coordinator at [facility] she will start [facility] SNF authorization tomorrow once PT note is available.  [employer], LCSW [phone]

## 2018-08-12 NOTE — Care Management Obs Status (Signed)
[facility] NOTIFICATION   Patient Details  Name: Joanne [name] MRN: [id number] Date of Birth: 14-Aug-1931   Medicare Observation Status Notification Given:  Yes    Marshell Garfinkel, RN 08/12/2018, 9:49 AM

## 2018-08-13 ENCOUNTER — Encounter: Payer: Self-pay | Admitting: Orthopedic Surgery

## 2018-08-13 ENCOUNTER — Encounter
Admission: RE | Admit: 2018-08-13 | Discharge: 2018-08-13 | Disposition: A | Discharge status: Home / Self Care | Payer: Medicare Other | Source: Ambulatory Visit | Attending: Internal Medicine | Admitting: Internal Medicine

## 2018-08-13 ENCOUNTER — Other Ambulatory Visit: Payer: Self-pay | Admitting: Adult Health

## 2018-08-13 DIAGNOSIS — S32030A Wedge compression fracture of third lumbar vertebra, initial encounter for closed fracture: Secondary | ICD-10-CM | POA: Diagnosis not present

## 2018-08-13 MED ORDER — TRAMADOL HCL 50 MG PO TABS: 50.0000 mg | ORAL_TABLET | Freq: Four times a day (QID) | ORAL | 0 refills | Status: DC | PRN

## 2018-08-13 MED ORDER — MAGNESIUM OXIDE 400 (241.3 MG) MG PO TABS: 400.0000 mg | ORAL_TABLET | Freq: Every day | ORAL | 0 refills | Status: DC

## 2018-08-13 MED ORDER — ALENDRONATE SODIUM 70 MG PO TABS: 70.0000 mg | ORAL_TABLET | ORAL | 0 refills | Status: DC

## 2018-08-13 MED ORDER — HYDROMORPHONE HCL 2 MG PO TABS: 1.0000 mg | ORAL_TABLET | Freq: Two times a day (BID) | ORAL | 0 refills | Status: DC | PRN

## 2018-08-13 MED ORDER — DOCUSATE SODIUM 100 MG PO CAPS: 100.0000 mg | ORAL_CAPSULE | Freq: Two times a day (BID) | ORAL | 0 refills | Status: DC

## 2018-08-13 NOTE — Discharge Summary (Signed)
[facility] [place] NAME: Joanne [name]    MR#:  [id number]  DATE OF BIRTH:  03-21-1931  DATE OF ADMISSION:  08/11/2018 ADMITTING PHYSICIAN: Saundra Shelling, MD  DATE OF DISCHARGE: 08/13/2018  PRIMARY CARE PHYSICIAN: Tracie Harrier, MD    ADMISSION DIAGNOSIS:  Acute low back pain  compression fx  DISCHARGE DIAGNOSIS:  Active Problems:   Vertebral compression fracture (HCC)   SECONDARY DIAGNOSIS:   Past Medical History:  Diagnosis Date  . Anemia, unspecified    B12 and iron deficiency   . Arthritis   . B12 deficiency   . Collagen vascular disease ([place])   . Crohn disease ([place])   . Crohn's disease ([place])    followed by Dr. Vira Agar with serial colonoscopies  . Dyspnea   . Fuchs' endothelial dystrophy   . GERD (gastroesophageal reflux disease)   . History of bone density study 09/01/2012  . History of spinal fracture   . Hyperlipidemia, unspecified   . Hypertension   . Hypokalemia   . Hypomagnesemia   . Osteoporosis   . Other dysphagia 01/24/2017  . Pneumonia    1980's  . Protein calorie malnutrition ([place])   . Retinal detachment   . Right shoulder injury 05/2002   treated conservatively  . Trigger finger   . Zenker diverticulum 09/25/2016    HOSPITAL COURSE:   1.  Subacute vertebral compression fracture of the lumbar spine.  Patient status post kyphoplasty yesterday by Dr. Rudene Christians orthopedic surgery.  Physical therapy recommended rehab. 2.  Intractable back pain and pain down the right leg.  Patient also has weakness down the right leg.  MRI did show spinal stenosis also.  Patient did receive a recent steroid injection.  Follow-up with Dr. Sharlet Salina as outpatient for future steroid injections on the back.  Physical therapy recommended rehab.  Patient also seen by Hector Shade from neurosurgery 3.  GERD on PPI 4.  History of Crohn's disease and collagen vascular disease 5.  Calcium and vitamin D and Fosamax prescribed for  compression fracture.  DISCHARGE CONDITIONS:   Follow-up with Dr. at rehab 1 day  CONSULTS OBTAINED:  Treatment Team:  Hessie Knows, MD  DRUG ALLERGIES:   Allergies  Allergen Reactions  . Oxycodone Other (See Comments)    "makes her crazy"    DISCHARGE MEDICATIONS:   Allergies as of 08/13/2018      Reactions   Oxycodone Other (See Comments)   "makes her crazy"      Medication List    TAKE these medications   acetaminophen 500 MG tablet Commonly known as:  TYLENOL Take 500 mg by mouth every 8 (eight) hours as needed for mild pain or moderate pain.   alendronate 70 MG tablet Commonly known as:  FOSAMAX Take 1 tablet (70 mg total) by mouth once a week. Take with a full glass of water on an empty stomach. Start taking on:  August 15, 2018   aspirin EC 325 MG tablet Take 325 mg by mouth daily.   budesonide 3 MG 24 hr capsule Commonly known as:  ENTOCORT EC Take 9 mg by mouth daily.   CALCIUM 600 + D PO Take 1 tablet by mouth 3 (three) times daily.   cholecalciferol 25 MCG (1000 UT) tablet Commonly known as:  VITAMIN D Take 1,000 Units by mouth daily.   CoQ10 100 MG Caps Take 2 capsules by mouth daily.   docusate sodium 100 MG capsule Commonly known as:  COLACE Take 1 capsule (100 mg total) by mouth 2 (two) times daily.   ENSURE ENLIVE PO Take 1 Bottle by mouth 2 (two) times daily.   feeding supplement (PRO-STAT SUGAR FREE 64) Liqd Take 30 mLs by mouth 2 (two) times daily.   gabapentin 300 MG capsule Commonly known as:  NEURONTIN Take 300 mg by mouth at bedtime.   gabapentin 100 MG capsule Commonly known as:  NEURONTIN Take 1 capsule by mouth 2 (two) times daily. In AM and at noon   HYDROmorphone 2 MG tablet Commonly known as:  DILAUDID Take 0.5 tablets (1 mg total) by mouth every 12 (twelve) hours as needed for severe pain (For pain unrelieved by hydrocodone).   iron polysaccharides 150 MG capsule Commonly known as:  NIFEREX Take 150 mg by  mouth daily.   LOPERAMIDE A-D 2 MG tablet Generic drug:  loperamide Take 2 mg by mouth 4 (four) times daily as needed for diarrhea or loose stools.   Magnesium Oxide 250 MG Tabs Take 500 mg by mouth daily.   magnesium oxide 400 (241.3 Mg) MG tablet Commonly known as:  MAG-OX Take 1 tablet (400 mg total) by mouth daily. Start taking on:  August 14, 2018   multivitamin tablet Take 1 tablet by mouth daily.   NEXIUM 40 MG capsule Generic drug:  esomeprazole Take 40 mg by mouth daily.   potassium chloride SA 20 MEQ tablet Commonly known as:  K-DUR,KLOR-CON Take 20 mEq by mouth 2 (two) times daily.   prednisoLONE acetate 1 % ophthalmic suspension Commonly known as:  PRED FORTE Place 1 drop into both eyes at bedtime.   RISA-BID PROBIOTIC Tabs Take 1 tablet by mouth daily.   torsemide 5 MG tablet Commonly known as:  DEMADEX Take 5 mg by mouth daily as needed.   traMADol 50 MG tablet Commonly known as:  ULTRAM Take 1 tablet (50 mg total) by mouth every 6 (six) hours as needed for moderate pain.   vitamin B-12 1000 MCG tablet Commonly known as:  CYANOCOBALAMIN Take 1,000 mcg by mouth daily.   vitamin C 500 MG tablet Commonly known as:  ASCORBIC ACID Take 500 mg by mouth daily.        DISCHARGE INSTRUCTIONS:   Follow-up with Dr. at rehab 1 day Follow-up with Sharlet Salina as scheduled  If you experience worsening of your admission symptoms, develop shortness of breath, life threatening emergency, suicidal or homicidal thoughts you must seek medical attention immediately by calling 911 or calling your MD immediately  if symptoms less severe.  You Must read complete instructions/literature along with all the possible adverse reactions/side effects for all the Medicines you take and that have been prescribed to you. Take any new Medicines after you have completely understood and accept all the possible adverse reactions/side effects.   Please note  You were cared  for by a hospitalist during your hospital stay. If you have any questions about your discharge medications or the care you received while you were in the hospital after you are discharged, you can call the unit and asked to speak with the hospitalist on call if the hospitalist that took care of you is not available. Once you are discharged, your primary care physician will handle any further medical issues. Please note that NO REFILLS for any discharge medications will be authorized once you are discharged, as it is imperative that you return to your primary care physician (or establish a relationship with a primary care physician if  you do not have one) for your aftercare needs so that they can reassess your need for medications and monitor your lab values.    Today   CHIEF COMPLAINT:  Intractable back pain  HISTORY OF PRESENT ILLNESS:  Brucha Ahlquist  is a 84 y.o. female presented with back pain   VITAL SIGNS:  Blood pressure (!) 146/66, pulse 78, temperature 97.7 F (36.5 C), temperature source Oral, resp. rate 18, SpO2 94 %.    PHYSICAL EXAMINATION:  GENERAL:  84 y.o.-year-old patient lying in the bed with no acute distress.  EYES: Pupils equal, round, reactive to light and accommodation. No scleral icterus. Extraocular muscles intact.  HEENT: Head atraumatic, normocephalic. Oropharynx and nasopharynx clear.  NECK:  Supple, no jugular venous distention. No thyroid enlargement, no tenderness.  LUNGS: Normal breath sounds bilaterally, no wheezing, rales,rhonchi or crepitation. No use of accessory muscles of respiration.  CARDIOVASCULAR: S1, S2 normal. No murmurs, rubs, or gallops.  ABDOMEN: Soft, non-tender, non-distended. Bowel sounds present. No organomegaly or mass.  EXTREMITIES: No pedal edema, cyanosis, or clubbing.  NEUROLOGIC: Cranial nerves II through XII are intact. Muscle strength 4/5 bilateral lower extremities. Sensation intact. Gait not checked.  PSYCHIATRIC: The patient is  alert and oriented x 3.  SKIN: No obvious rash, lesion, or ulcer.   DATA REVIEW:   CBC Recent Labs  Lab 08/07/18 1628  WBC 12.7*  HGB 13.0  HCT 41.2  PLT 445*    Chemistries  Recent Labs  Lab 08/11/18 1910  NA 135  K 4.5  CL 104  CO2 23  GLUCOSE 235*  BUN 23  CREATININE 0.71  CALCIUM 8.5*  AST 19  ALT 17  ALKPHOS 61  BILITOT 0.4    Microbiology Results  Results for orders placed or performed during the hospital encounter of 08/11/18  Surgical pcr screen     Status: None   Collection Time: 08/12/18  3:09 AM  Result Value Ref Range Status   MRSA, PCR NEGATIVE NEGATIVE Final   Staphylococcus aureus NEGATIVE NEGATIVE Final    Comment: (NOTE) The Xpert SA Assay (FDA approved for NASAL specimens in patients 75 years of age and older), is one component of a comprehensive surveillance program. It is not intended to diagnose infection nor to guide or monitor treatment. Performed at [facility], [address] [place], [place] [address]     RADIOLOGY:  Dg Lumbar Spine 2-3 Views  Result Date: 08/12/2018 CLINICAL DATA:  Kyphoplasty EXAM: LUMBAR SPINE - 2-3 VIEW COMPARISON:  MR 08/07/2018 FINDINGS: 2 fluoroscopic spot images document cement augmentation of L3 vertebral body. IMPRESSION: L3 cement augmentation. Electronically Signed   By: Lucrezia Europe M.D.   On: 08/12/2018 15:39   Dg C-arm 1-60 Min  Result Date: 08/12/2018 CLINICAL DATA:  Kyphoplasty at L3. FLUOROSCOPY TIME:  1 minutes 36 seconds. Images: 2 EXAM: DG C-ARM 61-120 MIN COMPARISON:  None. FINDINGS: Patient is status post kyphoplasty. The injected cement is within the vertebral body and the inferior aspect of the pedicle. The affected level cannot be determined on this study but is marked as L3. IMPRESSION: Kyphoplasty as above. Electronically Signed   By: Dorise Bullion III M.D   On: 08/12/2018 15:38     Management plans discussed with the patient, family and they are in agreement.  CODE  STATUS:     Code Status Orders  (From admission, onward)         Start     Ordered   08/11/18 1855  Full code  Continuous     08/11/18 1854        Code Status History    Date Active Date Inactive Code Status Order ID Comments User Context   08/11/2018 1737 08/11/2018 1854 DNR [id number]  Saundra Shelling, MD Inpatient   08/11/2018 1735 08/11/2018 1736 Full Code [id number]  Saundra Shelling, MD Inpatient   12/23/2017 1137 12/25/2017 1808 Full Code [id number]  Dereck Leep, MD Inpatient   01/22/2017 1502 01/23/2017 1413 Full Code [id number]  Hillary Bow, MD ED   02/21/2015 1939 02/23/2015 1908 Full Code [id number]  Loletha Grayer, MD ED    Advance Directive Documentation     Most Recent Value  Type of Advance Directive  Healthcare Power of Attorney  Pre-existing out of facility DNR order (yellow form or pink MOST form)  -  "MOST" Form in Place?  -      TOTAL TIME TAKING CARE OF THIS PATIENT: 35 minutes.    Loletha Grayer M.D on 08/13/2018 at 2:25 PM  Between 7am to 6pm - Pager - [phone]  After 6pm go to [email or web address] - password [email or web address]  [facility] Office  [phone]  CC: Primary care physician; Tracie Harrier, MD

## 2018-08-13 NOTE — Progress Notes (Signed)
Patient ID: Joanne [name], female   DOB: 01-Feb-1931, 84 y.o.   MRN: [id number]   [facility] PROGRESS NOTE  Joanne [name] BWL:[id number] DOB: Apr 15, 1931 DOA: 08/11/2018 PCP: Tracie Harrier, MD  HPI/Subjective: Patient states her back pain is better today.  She still complains of weakness in her legs.  She has difficulty getting around.  Objective: Vitals:   08/13/18 0433 08/13/18 0745  BP: (!) 162/56 (!) 164/62  Pulse: 73 79  Resp: 18   Temp:  98.2 F (36.8 C)  SpO2: 94% 96%     ROS: Review of Systems  Constitutional: Positive for malaise/fatigue. Negative for chills and fever.  Eyes: Negative for blurred vision.  Respiratory: Negative for cough and shortness of breath.   Cardiovascular: Negative for chest pain.  Gastrointestinal: Negative for abdominal pain, constipation, diarrhea, nausea and vomiting.  Genitourinary: Negative for dysuria.  Musculoskeletal: Positive for back pain. Negative for joint pain.  Neurological: Positive for weakness. Negative for dizziness and headaches.   Exam: Physical Exam  Constitutional: She is oriented to person, place, and time.  HENT:  Nose: No mucosal edema.  Mouth/Throat: No oropharyngeal exudate or posterior oropharyngeal edema.  Eyes: Pupils are equal, round, and reactive to light. Conjunctivae, EOM and lids are normal.  Neck: No JVD present. Carotid bruit is not present. No edema present. No thyroid mass and no thyromegaly present.  Cardiovascular: S1 normal and S2 normal. Exam reveals no gallop.  No murmur heard. Pulses:      Dorsalis pedis pulses are 2+ on the right side and 2+ on the left side.  Respiratory: No respiratory distress. She has no wheezes. She has no rhonchi. She has no rales.  GI: Soft. Bowel sounds are normal. There is no abdominal tenderness.  Musculoskeletal:     Right ankle: She exhibits no swelling.     Left ankle: She exhibits no swelling.  Lymphadenopathy:    She has no cervical adenopathy.   Neurological: She is alert and oriented to person, place, and time. No cranial nerve deficit.  Able to straight leg raise bilaterally  Skin: Skin is warm. No rash noted. Nails show no clubbing.  Psychiatric: She has a normal mood and affect.      Data Reviewed: Basic Metabolic Panel: Recent Labs  Lab 08/07/18 1628 08/11/18 1910  NA 133* 135  K 4.8 4.5  CL 98 104  CO2 25 23  GLUCOSE 118* 235*  BUN 28* 23  CREATININE 0.88 0.71  CALCIUM 9.0 8.5*   Liver Function Tests: Recent Labs  Lab 08/11/18 1910  AST 19  ALT 17  ALKPHOS 61  BILITOT 0.4  PROT 5.6*  ALBUMIN 2.7*   CBC: Recent Labs  Lab 08/07/18 1628  WBC 12.7*  HGB 13.0  HCT 41.2  MCV 92.2  PLT 445*     Recent Results (from the past 240 hour(s))  Surgical pcr screen     Status: None   Collection Time: 08/12/18  3:09 AM  Result Value Ref Range Status   MRSA, PCR NEGATIVE NEGATIVE Final   Staphylococcus aureus NEGATIVE NEGATIVE Final    Comment: (NOTE) The Xpert SA Assay (FDA approved for NASAL specimens in patients 38 years of age and older), is one component of a comprehensive surveillance program. It is not intended to diagnose infection nor to guide or monitor treatment. Performed at [facility], [address] [place], [place] [address]      Scheduled Meds: . aspirin EC  325 mg Oral  Daily  . budesonide  9 mg Oral Daily  . calcium carbonate  1 tablet Oral BID WC  . cholecalciferol  1,000 Units Oral Daily  . docusate sodium  100 mg Oral BID  . feeding supplement (ENSURE ENLIVE)  237 mL Oral BID BM  . feeding supplement (PRO-STAT SUGAR FREE 64)  30 mL Oral BID  . gabapentin  100 mg Oral BID  . gabapentin  300 mg Oral QHS  . iron polysaccharides  150 mg Oral Daily  . lactobacillus acidophilus  1 tablet Oral Daily  . magnesium oxide  400 mg Oral Daily  . multivitamin with minerals  1 tablet Oral Daily  . pantoprazole  40 mg Oral Daily  . prednisoLONE acetate  1 drop Both Eyes QHS   . vitamin B-12  1,000 mcg Oral Daily  . vitamin C  500 mg Oral Daily   Continuous Infusions: . lactated ringers 50 mL/hr at 08/12/18 1621  . lactated ringers 20 mL/hr at 08/12/18 1242    Assessment/Plan:  1. Subacute vertebral compression fracture of the lumbar spine.  Status post kyphoplasty yesterday by Dr. Rudene Christians.  Awaiting physical therapy evaluation today. 2. Intractable back pain and pain down the right leg.  Patient also does have spinal stenosis on MRI.  Awaiting on physical therapy evaluation today on how she does ambulating.  Case discussed with both the social worker and case rehab needed and care Freight forwarder.  The patient spinal stenosis likely causing some of her issues with her leg weakness. 3. GERD on PPI 4. History of Crohn's disease and collagen vascular disease  Code Status:     Code Status Orders  (From admission, onward)         Start     Ordered   08/11/18 1855  Full code  Continuous     08/11/18 1854        Code Status History    Date Active Date Inactive Code Status Order ID Comments User Context   08/11/2018 1737 08/11/2018 1854 DNR [id number]  Saundra Shelling, MD Inpatient   08/11/2018 1735 08/11/2018 1736 Full Code [id number]  Saundra Shelling, MD Inpatient   12/23/2017 1137 12/25/2017 1808 Full Code [id number]  Dereck Leep, MD Inpatient   01/22/2017 1502 01/23/2017 1413 Full Code [id number]  Hillary Bow, MD ED   02/21/2015 1939 02/23/2015 1908 Full Code [id number]  Loletha Grayer, MD ED    Advance Directive Documentation     Most Recent Value  Type of Advance Directive  Healthcare Power of Attorney, Living will  Pre-existing out of facility DNR order (yellow form or pink MOST form)  -  "MOST" Form in Place?  -     Disposition Plan: Awaiting physical therapy evaluation on whether the patient needs rehab or not.  Consultants:  Orthopedic surgery  Procedures:  Kyphoplasty  Time spent: 35 minutes.  Coordination of care and speaking with care manager  and Education officer, museum.  Also spoke with family at the bedside.  Johan Creveling [employer]

## 2018-08-13 NOTE — Care Management (Signed)
Kyphoplasty 08/12/18; PT evaluation 08/13/18. RNCM will follow along with CSW.

## 2018-08-13 NOTE — Evaluation (Signed)
Physical Therapy Evaluation Patient Details Name: Joanne [name] MRN: [id number] DOB: 1931-05-01 Today's Date: 08/13/2018   History of Present Illness  Pt is 84 yo female s/p kyphoplasty L3 08/12/18. PMH of HTN, GERD, arthritis, dysphagia, vision problems, and has been diagnosed with spinal stenosis as well.  Clinical Impression  Patient A&Ox4, reported low back pain 3-4/10, R leg pain 9/10, premedicated prior to session. Pt stated she lives alone, able to live on her first floor. Since November and increase in pain, pt has needed assistance with both ADLs and IADLs due to pain. Prior to November pt independent.  Pt demonstrated bed mobility with heavy use of bedrails and extended time, mod I. Pt  Complained of significant pain throughout mobility, limited ability to mobilize. Sit <> stand with CGA and heavy use of RW, unable to stand upright due to pain. Able to stand pivot with RW and CGA with shuffling steps to chair. With maximum verbal encouragement and close chair follow, pt able to ambulate additional 2-45f, unable to continue due to leg pain. The patient demonstrated limitations (see "PT Problem List") that impede patient's ability to perform functional activities and would benefit from skilled PT intervention to address as well as pain management and continued education. Recommendation is STR due to current level of assistance needed for mobility as well as decreased caregiver support.     Follow Up Recommendations SNF    Equipment Recommendations  None recommended by PT;Other (comment)(Pt has RW and 4WW at home.)    Recommendations for Other Services       Precautions / Restrictions Precautions Precautions: Fall Precaution Comments: s/p kyphoplasty Restrictions Weight Bearing Restrictions: No      Mobility  Bed Mobility Overal bed mobility: Modified Independent             General bed mobility comments: Heavy use of bedrails. grimacing with pain  Transfers Overall  transfer level: Needs assistance Equipment used: Rolling walker (2 wheeled) Transfers: Sit to/from Stand Sit to Stand: Min guard            Ambulation/Gait Ambulation/Gait assistance: Min guard;Min assist;+2 safety/equipment Gait Distance (Feet): 4 Feet Assistive device: Rolling walker (2 wheeled)   Gait velocity: significantly decreased   General Gait Details: trunk flexed, significant pain reported from RLE, shuffling step.   Stairs            Wheelchair Mobility    Modified Rankin (Stroke Patients Only)       Balance Overall balance assessment: Needs assistance   Sitting balance-Leahy Scale: Good       Standing balance-Leahy Scale: Poor                               Pertinent Vitals/Pain Pain Assessment: 0-10 Pain Score: 9  Pain Location: R leg, back pain 3-4/10 Pain Descriptors / Indicators: Aching;Shooting;Throbbing;Guarding;Grimacing;Crying Pain Intervention(s): Limited activity within patient's tolerance;Patient requesting pain meds-RN notified;Monitored during session;Premedicated before session;Repositioned    Home Living Family/patient expects to be discharged to:: Private residence Living Arrangements: Alone Available Help at Discharge: Family;Available PRN/intermittently Type of Home: House Home Access: Stairs to enter Entrance Stairs-Rails: None(threshold step, no rails) Entrance Stairs-Number of Steps: 1 Home Layout: Two level;Able to live on main level with bedroom/bathroom Home Equipment: CKasandra [name]- single point;Walker - 2 wheels;Walker - 4 wheels;Shower seat;Grab bars - tub/shower      Prior Function Level of Independence: Needs assistance   Gait / Transfers Assistance Needed:  Pt uses rollator for household and community ambulation  ADL's / Homemaking Assistance Needed: since November and increased back/R leg pain, but needs assistance from daugthers for ADLs and IADLs. Previous to November independent.  Comments: Reported  1-2 instances of losing balance, near falls in the last 6 months     Hand Dominance        Extremity/Trunk Assessment   Upper Extremity Assessment Upper Extremity Assessment: Generalized weakness    Lower Extremity Assessment Lower Extremity Assessment: Generalized weakness    Cervical / Trunk Assessment Cervical / Trunk Assessment: Normal  Communication   Communication: No difficulties  Cognition Arousal/Alertness: Awake/alert Behavior During Therapy: WFL for tasks assessed/performed Overall Cognitive Status: Within Functional Limits for tasks assessed                                        General Comments      Exercises     Assessment/Plan    PT Assessment Patient needs continued PT services  PT Problem List Decreased strength;Pain;Decreased range of motion;Decreased activity tolerance;Decreased balance;Decreased safety awareness;Decreased mobility;Decreased knowledge of precautions       PT Treatment Interventions DME instruction;Therapeutic exercise;Gait training;Balance training;Stair training;Neuromuscular re-education;Functional mobility training;Therapeutic activities;Patient/family education    PT Goals (Current goals can be found in the Care Plan section)  Acute Rehab PT Goals Patient Stated Goal: to decrease pain PT Goal Formulation: With patient Time For Goal Achievement: 08/27/18 Potential to Achieve Goals: Good    Frequency BID   Barriers to discharge Decreased caregiver support      Co-evaluation               AM-PAC PT "6 Clicks" Mobility  Outcome Measure Help needed turning from your back to your side while in a flat bed without using bedrails?: A Little Help needed moving from lying on your back to sitting on the side of a flat bed without using bedrails?: A Lot Help needed moving to and from a bed to a chair (including a wheelchair)?: A Lot Help needed standing up from a chair using your arms (e.g., wheelchair or  bedside chair)?: A Little Help needed to walk in hospital room?: A Lot Help needed climbing 3-5 steps with a railing? : Total 6 Click Score: 13    End of Session Equipment Utilized During Treatment: Gait belt Activity Tolerance: Patient limited by pain Patient left: with chair alarm set;in chair;with call bell/phone within reach;with SCD's reapplied;Other (comment)(heels elevated) Nurse Communication: Mobility status PT Visit Diagnosis: Unsteadiness on feet (R26.81);Other abnormalities of gait and mobility (R26.89);Pain;Muscle weakness (generalized) (M62.81) Pain - Right/Left: Right Pain - part of body: Knee    Time: 0623-7628 PT Time Calculation (min) (ACUTE ONLY): 30 min   Charges:   PT Evaluation $PT Eval Moderate Complexity: 1 Mod PT Treatments $Therapeutic Activity: 8-22 mins        Lieutenant Diego PT, DPT 11:38 AM,08/13/18 [phone]

## 2018-08-13 NOTE — Progress Notes (Addendum)
PT is recommending SNF. Clinical Education officer, museum (CSW) sent PT note to [place] via [place]. Per [facility] admissions coordinator at [facility] she will start [facility] SNF authorization today. Patient is aware of above. CSW contacted patient's daughter [name] and made her aware of above.   [employer], LCSW [phone]

## 2018-08-13 NOTE — Progress Notes (Signed)
Physical Therapy Treatment Patient Details Name: Joanne [name] MRN: [id number] DOB: 11-06-1930 Today's Date: 08/13/2018    History of Present Illness Pt is 84 yo female s/p kyphoplasty L3 08/12/18. PMH of HTN, GERD, arthritis, dysphagia, vision problems, and has been diagnosed with spinal stenosis as well.    PT Comments    Patient up in chair, agreeable to PT, pain in R leg 7/10, pain in L leg 6/10. Pt able to perform x3 sit to stands during session, cues for hand placement and upright posture, CGA but minAx1 for eccentric control to chair. Ambulated a total of 858f with RW and CGA-minA1, needed seated rest break at 439f max encouragement to participate due to pt reported increased leg pain. Pt able to participate in LE therapeutic exercises with minimal complaints of pain. In chair with all needs in reach at end of session. The patient would benefit from further skilled PT to continue to progress towards goals to maximize mobility, independence, and safety.     Follow Up Recommendations  SNF     Equipment Recommendations  None recommended by PT;Other (comment)    Recommendations for Other Services       Precautions / Restrictions Precautions Precautions: Fall Precaution Comments: s/p kyphoplasty Restrictions Weight Bearing Restrictions: No    Mobility  Bed Mobility Overal bed mobility: Modified Independent             General bed mobility comments: deferred up in recliner  Transfers Overall transfer level: Needs assistance Equipment used: Rolling walker (2 wheeled) Transfers: Sit to/from Stand Sit to Stand: Min guard;Min assist         General transfer comment: minAx1 x2 attempts for sit to stand for eccentric control. Mod verbal cues for hand placement.   Ambulation/Gait Ambulation/Gait assistance: Min guard;Min assist;+2 safety/equipment Gait Distance (Feet): 8 Feet Assistive device: Rolling walker (2 wheeled)   Gait velocity: significantly decreased    General Gait Details: trunk flexed, significant pain reported from RLE, shuffling step. Ambulation attempted x2, needed sitting rest break at [id number] to pain.   Stairs             Wheelchair Mobility    Modified Rankin (Stroke Patients Only)       Balance Overall balance assessment: Needs assistance   Sitting balance-Leahy Scale: Good     Standing balance support: Bilateral upper extremity supported Standing balance-Leahy Scale: Poor                              Cognition Arousal/Alertness: Awake/alert Behavior During Therapy: WFL for tasks assessed/performed Overall Cognitive Status: Within Functional Limits for tasks assessed                                 General Comments: pt demonstrated some emotional lability this session.       Exercises General Exercises - Lower Extremity Long Arc Quad: AROM;Strengthening;15 reps Hip Flexion/Marching: AROM;Strengthening;Both;10 reps Toe Raises: AROM;Both;Strengthening;20 reps Heel Raises: AROM;Both;Strengthening;20 reps    General Comments        Pertinent Vitals/Pain Pain Assessment: 0-10 Pain Score: 7  Pain Location: 7/10 for RLE, 6/10 for LLE Pain Descriptors / Indicators: Aching;Burning;Sharp;Shooting;Moaning;Crying;Grimacing Pain Intervention(s): Limited activity within patient's tolerance;Monitored during session;Premedicated before session;Repositioned    Home Living Family/patient expects to be discharged to:: Private residence Living Arrangements: Alone Available Help at Discharge: Family;Available PRN/intermittently Type of Home: House  Home Access: Stairs to enter Entrance Stairs-Rails: None(threshold step, no rails) Home Layout: Two level;Able to live on main level with bedroom/bathroom Home Equipment: Kasandra Knudsen - single point;Walker - 2 wheels;Walker - 4 wheels;Shower seat;Grab bars - tub/shower      Prior Function Level of Independence: Needs assistance  Gait / Transfers  Assistance Needed: Pt uses rollator for household and community ambulation ADL's / Homemaking Assistance Needed: since November and increased back/R leg pain, but needs assistance from daugthers for ADLs and IADLs. Previous to November independent. Comments: Reported 1-2 instances of losing balance, near falls in the last 6 months   PT Goals (current goals can now be found in the care plan section) Acute Rehab PT Goals Patient Stated Goal: to decrease pain PT Goal Formulation: With patient Time For Goal Achievement: 08/27/18 Potential to Achieve Goals: Good Progress towards PT goals: Progressing toward goals    Frequency    BID      PT Plan Current plan remains appropriate    Co-evaluation              AM-PAC PT "6 Clicks" Mobility   Outcome Measure  Help needed turning from your back to your side while in a flat bed without using bedrails?: A Little Help needed moving from lying on your back to sitting on the side of a flat bed without using bedrails?: A Lot Help needed moving to and from a bed to a chair (including a wheelchair)?: A Lot Help needed standing up from a chair using your arms (e.g., wheelchair or bedside chair)?: A Little Help needed to walk in hospital room?: A Lot Help needed climbing 3-5 steps with a railing? : Total 6 Click Score: 13    End of Session Equipment Utilized During Treatment: Gait belt Activity Tolerance: Patient limited by pain Patient left: with chair alarm set;in chair;with call bell/phone within reach;with SCD's reapplied;Other (comment);with family/visitor present Nurse Communication: Mobility status PT Visit Diagnosis: Unsteadiness on feet (R26.81);Other abnormalities of gait and mobility (R26.89);Pain;Muscle weakness (generalized) (M62.81) Pain - Right/Left: Right Pain - part of body: Knee     Time: 8527-7824 PT Time Calculation (min) (ACUTE ONLY): 25 min  Charges:  $Therapeutic Exercise: 8-22 mins $Therapeutic Activity:  8-22 mins                     Lieutenant Diego PT, DPT 2:49 PM,08/13/18 [phone]

## 2018-08-13 NOTE — Clinical Social Work Placement (Signed)
   CLINICAL SOCIAL WORK PLACEMENT  NOTE  Date:  08/13/2018  Patient Details  Name: Joanne [name] MRN: [id number] Date of Birth: [birth year]  Clinical Social Work is seeking post-discharge placement for this patient at the [place] level of care (*CSW will initial, date and re-position this form in  chart as items are completed):  Yes   Patient/family provided with [place] Work Department's list of facilities offering this level of care within the geographic area requested by the patient (or if unable, by the patient's family).  Yes   Patient/family informed of their freedom to choose among providers that offer the needed level of care, that participate in Medicare, Medicaid or managed care program needed by the patient, have an available bed and are willing to accept the patient.  Yes   Patient/family informed of [facility]'s ownership interest in [facility] and [facility], as well as of the fact that they are under no obligation to receive care at these facilities.  PASRR submitted to EDS on       PASRR number received on       Existing PASRR number confirmed on 08/12/18     FL2 transmitted to all facilities in geographic area requested by pt/family on 08/12/18     FL2 transmitted to all facilities within larger geographic area on       Patient informed that his/her managed care company has contracts with or will negotiate with certain facilities, including the following:        Yes   Patient/family informed of bed offers received.  Patient chooses bed at [facility] )     Physician recommends and patient chooses bed at      Patient to be transferred to [facility] ) on 08/13/18.  Patient to be transferred to facility by [facility] )     Patient family notified on 08/13/18 of transfer.  Name of family member notified:  (Patient's daughter [name] is aware of D/C today. )     PHYSICIAN       Additional  Comment:    _______________________________________________ Dalilah Curlin, Veronia Beets, LCSW 08/13/2018, 2:43 PM

## 2018-08-13 NOTE — Care Management (Addendum)
RNCM received notification from Glennis Brink with [facility] that patient is currently followed by their agency. Prior referral from Dr. Doristine Counter office. I confirmed with patient's daughter. She said [facility] recommended that patient go to hospital.

## 2018-08-13 NOTE — Progress Notes (Signed)
Patient is medically stable for D/C to [facility] today. Per [facility] admissions coordinator at [facility] SNF authorization has been received and patient can come today to room 206. RN will call report at [phone] and arrange EMS for transport. Clinical Education officer, museum (CSW) sent D/C orders to [employer] via [employer]. Patient is aware of above. CSW contacted patient's daughter [name] and made her aware of above. Please reconsult if future social work needs arise. CSW signing off.   [employer], LCSW [phone]

## 2018-08-14 ENCOUNTER — Encounter: Payer: Self-pay | Admitting: Adult Health

## 2018-08-14 ENCOUNTER — Non-Acute Institutional Stay (SKILLED_NURSING_FACILITY): Payer: Medicare Other | Admitting: Adult Health

## 2018-08-14 DIAGNOSIS — K219 Gastro-esophageal reflux disease without esophagitis: Secondary | ICD-10-CM

## 2018-08-14 DIAGNOSIS — K5080 Crohn's disease of both small and large intestine without complications: Secondary | ICD-10-CM

## 2018-08-14 DIAGNOSIS — E876 Hypokalemia: Secondary | ICD-10-CM

## 2018-08-14 DIAGNOSIS — M8000XD Age-related osteoporosis with current pathological fracture, unspecified site, subsequent encounter for fracture with routine healing: Secondary | ICD-10-CM | POA: Diagnosis not present

## 2018-08-14 DIAGNOSIS — I10 Essential (primary) hypertension: Secondary | ICD-10-CM

## 2018-08-14 DIAGNOSIS — D649 Anemia, unspecified: Secondary | ICD-10-CM | POA: Diagnosis not present

## 2018-08-14 DIAGNOSIS — M5416 Radiculopathy, lumbar region: Secondary | ICD-10-CM

## 2018-08-14 DIAGNOSIS — S32030S Wedge compression fracture of third lumbar vertebra, sequela: Secondary | ICD-10-CM

## 2018-08-14 DIAGNOSIS — M48061 Spinal stenosis, lumbar region without neurogenic claudication: Secondary | ICD-10-CM

## 2018-08-14 LAB — SURGICAL PATHOLOGY

## 2018-08-14 NOTE — Progress Notes (Signed)
Location:   [facility] Room Number: 206 A Place of Service:  SNF (31)   CODE STATUS: Full Code  Allergies  Allergen Reactions  . Oxycodone Other (See Comments)    "makes her crazy"    Chief Complaint  Patient presents with  . Hospitalization Follow-up    Hospital Follow up    HPI:  She is a 84 year old woman who has been hospitalized for a subacute vertebral compression fracture requiring an L3 kyphoplasty. She does have back with pain down her right leg. She has had a recent steroid injection. She does have spinal stenosis. She is here for short term rehab with her goal to return back home. There are no reports of fevers present and no reports of changes in appetite. She will continue to be followed for her chronic illnesses including: crohn's disease; osteoporosis; and protein calorie malnutrition    Past Medical History:  Diagnosis Date  . Anemia, unspecified    B12 and iron deficiency   . Arthritis   . B12 deficiency   . Collagen vascular disease ([place])   . Crohn disease ([place])   . Crohn's disease ([place])    followed by Dr. Vira Agar with serial colonoscopies  . Dyspnea   . Fuchs' endothelial dystrophy   . GERD (gastroesophageal reflux disease)   . History of bone density study 09/01/2012  . History of spinal fracture   . Hyperlipidemia, unspecified   . Hypertension   . Hypokalemia   . Hypomagnesemia   . Osteoporosis   . Other dysphagia 01/24/2017  . Pneumonia    1980's  . Protein calorie malnutrition ([place])   . Retinal detachment   . Right shoulder injury 05/2002   treated conservatively  . Trigger finger   . Zenker diverticulum 09/25/2016    Past Surgical History:  Procedure Laterality Date  . APPENDECTOMY  1949  . CATARACT EXTRACTION EXTRACAPSULAR Right 05/10/2015   Procedure: EXTRACTION CATARACT EXTRACAPSULAR W/INSERTION INTRAOCULAR PROSTHESIS; Surgeon: Doyce Para, MD; [facility]; Service: Ophthalmology; Laterality:  Right;  . CHOLECYSTECTOMY  1992  . COLONOSCOPY     03/24/1992, 03/11/1998, 07/08/2002, 05/08/2006, 01/31/2012   PH Crohns disease; no repeat due to age per RTE (dw)  . CORNEAL TRANSPLANT Left 07/08/2012   Procedure: CORNEAL TRANSPLANT DSAEK ** tissue ordered OSI 06/27/2012; Surgeon: Doyce Para, MD; Location: [place]; Service: Ophthalmology;  . ESOPHAGOGASTRODUODENOSCOPY     05/01/2005, 01/31/2012 ; No repeat per RTE  . ESOPHAGOGASTRODUODENOSCOPY (EGD) WITH PROPOFOL N/A 05/30/2018   Procedure: ESOPHAGOGASTRODUODENOSCOPY (EGD) WITH PROPOFOL;  Surgeon: Manya Silvas, MD;  Location: [facility] ENDOSCOPY;  Service: Endoscopy;  Laterality: N/A;  . EYE SURGERY  2012, 2013   5 total  . JOINT REPLACEMENT Right 01-28-13  . KYPHOPLASTY N/A 08/12/2018   Procedure: KYPHOPLASTY L3;  Surgeon: Hessie Knows, MD;  Location: [facility] ORS;  Service: Orthopedics;  Laterality: N/A;  . LENSECTOMY PHACOFRAGMENTATION WITH ASPIRATION Left 07/25/2011  . TONSILLECTOMY  1950  . TOTAL HIP ARTHROPLASTY Left 12/23/2017   Procedure: TOTAL HIP ARTHROPLASTY;  Surgeon: Dereck Leep, MD;  Location: [facility] ORS;  Service: Orthopedics;  Laterality: Left;  Marland Kitchen VITREOUS RETINAL SURGERY Left 11/07/2011   PPV/SO  . VITREOUS RETINAL SURGERY Left 04/18/2011   TPPV/TRP/SO/SB/EL/C3F8    Social History   Socioeconomic History  . Marital status: Widowed    Spouse name: Not on file  . Number of children: 2  . Years of education: college  . Highest education level: Not  on file  Occupational History  . Occupation: retired    Comment: Pharmacist, hospital  Social Needs  . Financial resource strain: Not on file  . Food insecurity:    Worry: Not on file    Inability: Not on file  . Transportation needs:    Medical: Not on file    Non-medical: Not on file  Tobacco Use  . Smoking status: Former Smoker    Packs/day: 0.50    Years: 35.00    Pack years: 17.50    Types: Cigarettes    Last attempt to quit: 01/29/1991    Years since quitting: 27.5  .  Smokeless tobacco: Never Used  Substance and Sexual Activity  . Alcohol use: Yes    Comment: 3x/yr  . Drug use: No  . Sexual activity: Never  Lifestyle  . Physical activity:    Days per week: Not on file    Minutes per session: Not on file  . Stress: Not on file  Relationships  . Social connections:    Talks on phone: Not on file    Gets together: Not on file    Attends religious service: Not on file    Active member of club or organization: Not on file    Attends meetings of clubs or organizations: Not on file    Relationship status: Not on file  . Intimate partner violence:    Fear of current or ex partner: Not on file    Emotionally abused: Not on file    Physically abused: Not on file    Forced sexual activity: Not on file  Other Topics Concern  . Not on file  Social History Narrative   Widowed   Former smoker   Occasional drink 3 x a year   2 children    Full Code   Family History  Problem Relation Age of Onset  . Hypertension Mother   . Stroke Father   . Hypertension Father   . Arthritis Other   . Prostate cancer Other   . Psoriasis Other   . Stroke Other   . Thyroid disease Other   . Anesthesia problems Neg Hx   . Diabetes Neg Hx   . Glaucoma Neg Hx   . Macular degeneration Neg Hx       VITAL SIGNS BP (!) 130/54   Pulse 80   Temp 97.9 F (36.6 C)   Resp 17   Ht 5' 2"  (1.575 m)   Wt 158 lb 1.6 oz (71.7 kg)   SpO2 97%   BMI 28.92 kg/m   Outpatient Encounter Medications as of 08/14/2018  Medication Sig  . acetaminophen (TYLENOL) 500 MG tablet Take 500 mg by mouth every 8 (eight) hours as needed for mild pain or moderate pain.   Derrill Memo ON 08/15/2018] alendronate (FOSAMAX) 70 MG tablet Take 1 tablet (70 mg total) by mouth once a week. Take with a full glass of water on an empty stomach.  . Amino Acids-Protein Hydrolys (FEEDING SUPPLEMENT, PRO-STAT SUGAR FREE 64,) LIQD Take 30 mLs by mouth 2 (two) times daily.  Marland Kitchen aspirin EC 325 MG tablet Take 325  mg by mouth daily.   . budesonide (ENTOCORT EC) 3 MG 24 hr capsule Take 9 mg by mouth daily.   . Calcium Citrate-Vitamin D (CITRACAL MAXIMUM) 315-250 MG-UNIT TABS Take 1 tablet by mouth 3 (three) times daily.  . cholecalciferol (VITAMIN D) 1000 units tablet Take 1,000 Units by mouth daily.   . Coenzyme Q10 (COQ10) 100  MG CAPS Take 2 capsules by mouth daily.  Marland Kitchen docusate sodium (COLACE) 100 MG capsule Take 1 capsule (100 mg total) by mouth 2 (two) times daily.  Marland Kitchen gabapentin (NEURONTIN) 100 MG capsule Take 2 capsules by mouth 2 (two) times daily. In AM and at noon  . gabapentin (NEURONTIN) 300 MG capsule Take 300 mg by mouth at bedtime.  Marland Kitchen HYDROmorphone (DILAUDID) 2 MG tablet Take 0.5 tablets (1 mg total) by mouth every 12 (twelve) hours as needed for severe pain (For pain unrelieved by hydrocodone).  . iron polysaccharides (NIFEREX) 150 MG capsule Take 150 mg by mouth daily.  Marland Kitchen loperamide (LOPERAMIDE A-D) 2 MG tablet Take 2 mg by mouth 3 (three) times daily.   . magnesium oxide (MAG-OX) 400 (241.3 Mg) MG tablet Take 1 tablet (400 mg total) by mouth daily.  . Multiple Vitamin (MULTIVITAMIN) tablet Take 1 tablet by mouth daily.  Marland Kitchen NEXIUM 40 MG capsule Take 40 mg by mouth daily.   . NON FORMULARY Diet Type:  regular  . Nutritional Supplements (ENSURE ENLIVE PO) Take 1 Bottle by mouth 2 (two) times daily.  . potassium chloride SA (K-DUR,KLOR-CON) 20 MEQ tablet Take 20 mEq by mouth 2 (two) times daily.  . prednisoLONE acetate (PRED FORTE) 1 % ophthalmic suspension Place 1 drop into both eyes at bedtime.  . Probiotic Product (RISA-BID PROBIOTIC) TABS Take 1 tablet by mouth daily.  Marland Kitchen torsemide (DEMADEX) 5 MG tablet Take 5 mg by mouth daily as needed.   . traMADol (ULTRAM) 50 MG tablet Take 1 tablet (50 mg total) by mouth every 6 (six) hours as needed for moderate pain.  . vitamin B-12 (CYANOCOBALAMIN) 1000 MCG tablet Take 1,000 mcg by mouth daily.  . vitamin C (ASCORBIC ACID) 500 MG tablet Take 500 mg  by mouth daily.   No facility-administered encounter medications on file as of 08/14/2018.      SIGNIFICANT DIAGNOSTIC EXAMS  TODAY;   07-31-18: right lower extremity venous doppler: No evidence of deep venous thrombosis.  08-07-18: MRI lumbar spine:  1. Deformity of the L3 inferior endplate with moderate marrow edema, suggesting subacute compression fracture. This does appear new compared to the CT from 05/14/2018, allowing for differences in technique. Correlate for any history of recent trauma. 2. Moderate spinal canal and severe bilateral neural foraminal stenosis at L3-4. 3. L4-5 left lateral recess narrowing that may be a source of left L5 radiculopathy. 4. L4-5 mild right and moderate left neural foraminal stenosis.  LABS REVIEWED: TODAY:   08-07-18: wbc 12.7; hgb 13.0; hct 41.2; mcv 92.2; plt 445; glucose 235; bun 23; create 0.71; k+ 4.5; na++ 135; ca 8.5; liver normal albumin 2.7   Review of Systems  Constitutional: Negative for malaise/fatigue.  Respiratory: Negative for cough and shortness of breath.   Cardiovascular: Negative for chest pain, palpitations and leg swelling.  Gastrointestinal: Negative for abdominal pain, constipation and heartburn.  Musculoskeletal: Positive for back pain. Negative for joint pain and myalgias.       Is being managed   Skin: Negative.   Neurological: Negative for dizziness.  Psychiatric/Behavioral: The patient is not nervous/anxious.     Physical Exam Constitutional:      General: She is not in acute distress.    Appearance: Normal appearance. She is well-developed. She is not diaphoretic.  Neck:     Musculoskeletal: Neck supple.     Thyroid: No thyromegaly.  Cardiovascular:     Rate and Rhythm: Normal rate and regular rhythm.  Pulses: Normal pulses.     Heart sounds: Normal heart sounds.  Pulmonary:     Effort: Pulmonary effort is normal. No respiratory distress.     Breath sounds: Normal breath sounds.  Abdominal:      General: Bowel sounds are normal. There is no distension.     Palpations: Abdomen is soft.     Tenderness: There is no abdominal tenderness.  Musculoskeletal: Normal range of motion.     Right lower leg: No edema.     Left lower leg: No edema.     Comments: Is able to move all extremities   Lymphadenopathy:     Cervical: No cervical adenopathy.  Skin:    General: Skin is warm and dry.  Neurological:     General: No focal deficit present.     Mental Status: She is alert and oriented to person, place, and time.  Psychiatric:        Mood and Affect: Mood normal.     ASSESSMENT/ PLAN:  TODAY:   1. Crohn,s disease: is stable will continue budesonide 9 mg daily   2. Age related osteoporosis with current pathological fracture with routine healing: is stable will continue fosamax weekly is taking calcium and vit d supplements  3. Chronic anemia: hgb 13.0; will continue niferex 150 mg daily   4. GERD without esophagitis: is stable will continue nexium 40 mg daily   5. Hypokalemia: is stable k+ 4.5; will continue k+ 20 meq twice daily   6. Essential benign hypertension: is stable 130/54: will continue asa 325 mg daily   7. Protein calorie malnutrition severe: is stable albumin 2.7 will continue supplements   8. Spinal stenosis of lumbar region with radiculopathy: is stable will continue neurontin 200 mg twice daily and 300 mg nightly   9. Vertebral compression fracture L3: is status post kyphoplasty: will continue therapy as directed will continue ultram 50 mg every 6 hours as needed and  Dilaudid 1 mg twelve hours as needed     MD is aware of resident's narcotic use and is in agreement with current plan of care. We will attempt to wean resident as apropriate   Ok Edwards NP [facility]  Contact [phone] Monday through Friday 8am- 5pm  After hours call [phone]

## 2018-08-15 ENCOUNTER — Encounter: Payer: Self-pay | Admitting: Adult Health

## 2018-08-15 ENCOUNTER — Non-Acute Institutional Stay (SKILLED_NURSING_FACILITY): Payer: Medicare Other | Admitting: Adult Health

## 2018-08-15 DIAGNOSIS — M5416 Radiculopathy, lumbar region: Secondary | ICD-10-CM

## 2018-08-15 DIAGNOSIS — Z9889 Other specified postprocedural states: Secondary | ICD-10-CM

## 2018-08-15 DIAGNOSIS — M48061 Spinal stenosis, lumbar region without neurogenic claudication: Secondary | ICD-10-CM | POA: Diagnosis not present

## 2018-08-15 NOTE — Progress Notes (Signed)
[facility]:   [facility] Room Number: 206 A Place of Service:  SNF (31)   CODE STATUS: Full Code  Allergies  Allergen Reactions  . Oxycodone Other (See Comments)    "makes her crazy"    Chief Complaint  Patient presents with  . Acute Visit    Pain Management    HPI:  She continues to have right back and leg pain which is severe in nature. She is not getting adequate pain relief with her current regimen. Her pain is interfering in her ability to participate in therapy and sleep.   Past Medical History:  Diagnosis Date  . Anemia, unspecified    B12 and iron deficiency   . Arthritis   . B12 deficiency   . Collagen vascular disease ([place])   . Crohn disease ([place])   . Crohn's disease ([place])    followed by Dr. Vira Agar with serial colonoscopies  . Dyspnea   . Fuchs' endothelial dystrophy   . GERD (gastroesophageal reflux disease)   . History of bone density study 09/01/2012  . History of spinal fracture   . Hyperlipidemia, unspecified   . Hypertension   . Hypokalemia   . Hypomagnesemia   . Osteoporosis   . Other dysphagia 01/24/2017  . Pneumonia    1980's  . Protein calorie malnutrition ([place])   . Retinal detachment   . Right shoulder injury 05/2002   treated conservatively  . Trigger finger   . Zenker diverticulum 09/25/2016    Past Surgical History:  Procedure Laterality Date  . APPENDECTOMY  1949  . CATARACT EXTRACTION EXTRACAPSULAR Right 05/10/2015   Procedure: EXTRACTION CATARACT EXTRACAPSULAR W/INSERTION INTRAOCULAR PROSTHESIS; Surgeon: Doyce Para, MD; [facility]; Service: Ophthalmology; Laterality: Right;  . CHOLECYSTECTOMY  1992  . COLONOSCOPY     03/24/1992, 03/11/1998, 07/08/2002, 05/08/2006, 01/31/2012   PH Crohns disease; no repeat due to age per RTE (dw)  . CORNEAL TRANSPLANT Left 07/08/2012   Procedure: CORNEAL TRANSPLANT DSAEK ** tissue ordered OSI 06/27/2012; Surgeon: Doyce Para, MD; Location: [place]; Service:  Ophthalmology;  . ESOPHAGOGASTRODUODENOSCOPY     05/01/2005, 01/31/2012 ; No repeat per RTE  . ESOPHAGOGASTRODUODENOSCOPY (EGD) WITH PROPOFOL N/A 05/30/2018   Procedure: ESOPHAGOGASTRODUODENOSCOPY (EGD) WITH PROPOFOL;  Surgeon: Manya Silvas, MD;  Location: [facility] ENDOSCOPY;  Service: Endoscopy;  Laterality: N/A;  . EYE SURGERY  2012, 2013   5 total  . JOINT REPLACEMENT Right 01-28-13  . KYPHOPLASTY N/A 08/12/2018   Procedure: KYPHOPLASTY L3;  Surgeon: Hessie Knows, MD;  Location: [facility] ORS;  Service: Orthopedics;  Laterality: N/A;  . LENSECTOMY PHACOFRAGMENTATION WITH ASPIRATION Left 07/25/2011  . TONSILLECTOMY  1950  . TOTAL HIP ARTHROPLASTY Left 12/23/2017   Procedure: TOTAL HIP ARTHROPLASTY;  Surgeon: Dereck Leep, MD;  Location: [facility] ORS;  Service: Orthopedics;  Laterality: Left;  Marland Kitchen VITREOUS RETINAL SURGERY Left 11/07/2011   PPV/SO  . VITREOUS RETINAL SURGERY Left 04/18/2011   TPPV/TRP/SO/SB/EL/C3F8    Social History   Socioeconomic History  . Marital status: Widowed    Spouse name: Not on file  . Number of children: 2  . Years of education: college  . Highest education level: Not on file  Occupational History  . Occupation: retired    Comment: Pharmacist, hospital  Social Needs  . Financial resource strain: Not on file  . Food insecurity:    Worry: Not on file    Inability: Not on file  . Transportation needs:    Medical: Not on  file    Non-medical: Not on file  Tobacco Use  . Smoking status: Former Smoker    Packs/day: 0.50    Years: 35.00    Pack years: 17.50    Types: Cigarettes    Last attempt to quit: 01/29/1991    Years since quitting: 27.5  . Smokeless tobacco: Never Used  Substance and Sexual Activity  . Alcohol use: Yes    Comment: 3x/yr  . Drug use: No  . Sexual activity: Never  Lifestyle  . Physical activity:    Days per week: Not on file    Minutes per session: Not on file  . Stress: Not on file  Relationships  . Social connections:    Talks on phone: Not  on file    Gets together: Not on file    Attends religious service: Not on file    Active member of club or organization: Not on file    Attends meetings of clubs or organizations: Not on file    Relationship status: Not on file  . Intimate partner violence:    Fear of current or ex partner: Not on file    Emotionally abused: Not on file    Physically abused: Not on file    Forced sexual activity: Not on file  Other Topics Concern  . Not on file  Social History Narrative   Widowed   Former smoker   Occasional drink 3 x a year   2 children    Full Code   Family History  Problem Relation Age of Onset  . Hypertension Mother   . Stroke Father   . Hypertension Father   . Arthritis Other   . Prostate cancer Other   . Psoriasis Other   . Stroke Other   . Thyroid disease Other   . Anesthesia problems Neg Hx   . Diabetes Neg Hx   . Glaucoma Neg Hx   . Macular degeneration Neg Hx       VITAL SIGNS BP (!) 155/70   Pulse 78   Temp 97.8 F (36.6 C)   Resp 18   Ht 5' 2"  (1.575 m)   Wt 158 lb 1.6 oz (71.7 kg)   SpO2 98%   BMI 28.92 kg/m   Outpatient Encounter Medications as of 08/15/2018  Medication Sig  . acetaminophen (TYLENOL) 500 MG tablet Take 500 mg by mouth every 8 (eight) hours as needed for mild pain or moderate pain.   Marland Kitchen alendronate (FOSAMAX) 70 MG tablet Take 1 tablet (70 mg total) by mouth once a week. Take with a full glass of water on an empty stomach.  . Amino Acids-Protein Hydrolys (FEEDING SUPPLEMENT, PRO-STAT SUGAR FREE 64,) LIQD Take 30 mLs by mouth 2 (two) times daily.  Marland Kitchen aspirin EC 325 MG tablet Take 325 mg by mouth daily.   . budesonide (ENTOCORT EC) 3 MG 24 hr capsule Take 9 mg by mouth daily.   . Calcium Citrate-Vitamin D (CITRACAL MAXIMUM) 315-250 MG-UNIT TABS Take 1 tablet by mouth 3 (three) times daily.  . cholecalciferol (VITAMIN D) 1000 units tablet Take 1,000 Units by mouth daily.   . Coenzyme Q10 (COQ10) 100 MG CAPS Take 2 capsules by mouth  daily.  Marland Kitchen docusate sodium (COLACE) 100 MG capsule Take 1 capsule (100 mg total) by mouth 2 (two) times daily.  Marland Kitchen gabapentin (NEURONTIN) 100 MG capsule Take 2 capsules by mouth 2 (two) times daily. In AM and at noon  . gabapentin (NEURONTIN) 300 MG capsule  Take 300 mg by mouth at bedtime.  Marland Kitchen HYDROmorphone (DILAUDID) 2 MG tablet Take 0.5 tablets (1 mg total) by mouth every 12 (twelve) hours as needed for severe pain (For pain unrelieved by hydrocodone).  . iron polysaccharides (NIFEREX) 150 MG capsule Take 150 mg by mouth daily.  Marland Kitchen loperamide (LOPERAMIDE A-D) 2 MG tablet Take 2 mg by mouth 3 (three) times daily.   . magnesium oxide (MAG-OX) 400 (241.3 Mg) MG tablet Take 1 tablet (400 mg total) by mouth daily.  . Multiple Vitamin (MULTIVITAMIN) tablet Take 1 tablet by mouth daily.  Marland Kitchen NEXIUM 40 MG capsule Take 40 mg by mouth daily.   . NON FORMULARY Diet Type:  regular  . Nutritional Supplements (ENSURE ENLIVE PO) Take 1 Bottle by mouth 2 (two) times daily.  . potassium chloride SA (K-DUR,KLOR-CON) 20 MEQ tablet Take 20 mEq by mouth 2 (two) times daily.  . prednisoLONE acetate (PRED FORTE) 1 % ophthalmic suspension Place 1 drop into both eyes at bedtime.  . Probiotic Product (RISA-BID PROBIOTIC) TABS Take 1 tablet by mouth daily.  Marland Kitchen torsemide (DEMADEX) 5 MG tablet Take 5 mg by mouth daily as needed.   . traMADol (ULTRAM) 50 MG tablet Take 1 tablet (50 mg total) by mouth every 6 (six) hours as needed for moderate pain.  . vitamin B-12 (CYANOCOBALAMIN) 1000 MCG tablet Take 1,000 mcg by mouth daily.  . vitamin C (ASCORBIC ACID) 500 MG tablet Take 500 mg by mouth daily.   No facility-administered encounter medications on file as of 08/15/2018.      SIGNIFICANT DIAGNOSTIC EXAMS  PREVIOUS;   07-31-18: right lower extremity venous doppler: No evidence of deep venous thrombosis.  08-07-18: MRI lumbar spine:  1. Deformity of the L3 inferior endplate with moderate marrow edema, suggesting subacute  compression fracture. This does appear new compared to the CT from 05/14/2018, allowing for differences in technique. Correlate for any history of recent trauma. 2. Moderate spinal canal and severe bilateral neural foraminal stenosis at L3-4. 3. L4-5 left lateral recess narrowing that may be a source of left L5 radiculopathy. 4. L4-5 mild right and moderate left neural foraminal stenosis.   NO NEW LABS.   LABS REVIEWED: PREVIOUS:   08-07-18: wbc 12.7; hgb 13.0; hct 41.2; mcv 92.2; plt 445; glucose 235; bun 23; create 0.71; k+ 4.5; na++ 135; ca 8.5; liver normal albumin 2.7    NO NEW LABS.   Review of Systems  Constitutional: Negative for malaise/fatigue.  Respiratory: Negative for cough and shortness of breath.   Cardiovascular: Negative for chest pain, palpitations and leg swelling.  Gastrointestinal: Negative for abdominal pain, constipation and heartburn.  Musculoskeletal: Positive for back pain and myalgias. Negative for joint pain.       Pain is sharp and achy in nature and does radiate to right leg.   Skin: Negative.   Neurological: Negative for dizziness.  Psychiatric/Behavioral: The patient is not nervous/anxious.     Physical Exam Constitutional:      General: She is not in acute distress.    Appearance: She is well-developed. She is not diaphoretic.  Neck:     Musculoskeletal: Neck supple.     Thyroid: No thyromegaly.  Cardiovascular:     Rate and Rhythm: Normal rate and regular rhythm.     Pulses: Normal pulses.     Heart sounds: Normal heart sounds.  Pulmonary:     Effort: Pulmonary effort is normal. No respiratory distress.     Breath sounds: Normal breath  sounds.  Abdominal:     General: Bowel sounds are normal. There is no distension.     Palpations: Abdomen is soft.     Tenderness: There is no abdominal tenderness.  Musculoskeletal:     Right lower leg: No edema.     Left lower leg: No edema.     Comments: Has tenderness to her right ischial area Lower  back tenderness Bilateral hip tenderness  Lymphadenopathy:     Cervical: No cervical adenopathy.  Skin:    General: Skin is warm and dry.  Neurological:     General: No focal deficit present.     Mental Status: She is alert and oriented to person, place, and time.  Psychiatric:        Mood and Affect: Mood normal.       ASSESSMENT/ PLAN:  TODAY:   1. Spinal stenosis of lumbar region with radiculopathy: is status post L3 kyphoplasty: continues to have pain: will continue gabapentin 200 mg twice daily and 300 mg nightly and has ultram 50 mg every 6 hours as needed: will being prednisone taper 60 mg through 12/24; 50 mg from 12/25 - 12/28; 40 mg 12/29-1/1; 30 mg 1/2 - 1/5; 20 mg 1/6-1/9; 10 mg 1/10-1/13 will monitor her status.       MD is aware of resident's narcotic use and is in agreement with current plan of care. We will attempt to wean resident as apropriate   Ok Edwards NP [facility]  Contact [phone] Monday through Friday 8am- 5pm  After hours call [phone]

## 2018-08-17 DIAGNOSIS — M48061 Spinal stenosis, lumbar region without neurogenic claudication: Secondary | ICD-10-CM | POA: Insufficient documentation

## 2018-08-17 DIAGNOSIS — K219 Gastro-esophageal reflux disease without esophagitis: Secondary | ICD-10-CM | POA: Insufficient documentation

## 2018-08-17 DIAGNOSIS — M5416 Radiculopathy, lumbar region: Secondary | ICD-10-CM

## 2018-08-17 DIAGNOSIS — Z9889 Other specified postprocedural states: Secondary | ICD-10-CM | POA: Insufficient documentation

## 2018-08-21 ENCOUNTER — Other Ambulatory Visit: Payer: Self-pay | Admitting: Adult Health

## 2018-08-21 ENCOUNTER — Non-Acute Institutional Stay (SKILLED_NURSING_FACILITY): Payer: Medicare Other | Admitting: Adult Health

## 2018-08-21 DIAGNOSIS — M5416 Radiculopathy, lumbar region: Secondary | ICD-10-CM

## 2018-08-21 DIAGNOSIS — M48061 Spinal stenosis, lumbar region without neurogenic claudication: Secondary | ICD-10-CM | POA: Diagnosis not present

## 2018-08-21 DIAGNOSIS — S32030S Wedge compression fracture of third lumbar vertebra, sequela: Secondary | ICD-10-CM | POA: Diagnosis not present

## 2018-08-21 DIAGNOSIS — E43 Unspecified severe protein-calorie malnutrition: Secondary | ICD-10-CM | POA: Diagnosis not present

## 2018-08-21 MED ORDER — HYDROMORPHONE HCL 2 MG PO TABS: 1.0000 mg | ORAL_TABLET | Freq: Two times a day (BID) | ORAL | 0 refills | Status: DC | PRN

## 2018-08-22 ENCOUNTER — Encounter: Payer: Self-pay | Admitting: Adult Health

## 2018-08-22 ENCOUNTER — Non-Acute Institutional Stay (SKILLED_NURSING_FACILITY): Payer: Medicare Other | Admitting: Adult Health

## 2018-08-22 DIAGNOSIS — K5080 Crohn's disease of both small and large intestine without complications: Secondary | ICD-10-CM | POA: Diagnosis not present

## 2018-08-22 DIAGNOSIS — M5416 Radiculopathy, lumbar region: Secondary | ICD-10-CM

## 2018-08-22 DIAGNOSIS — M48061 Spinal stenosis, lumbar region without neurogenic claudication: Secondary | ICD-10-CM

## 2018-08-22 DIAGNOSIS — S32030S Wedge compression fracture of third lumbar vertebra, sequela: Secondary | ICD-10-CM | POA: Diagnosis not present

## 2018-08-22 NOTE — Progress Notes (Signed)
Location:   [place] Room Number: 2 Place of Service:  SNF (31)   CODE STATUS: full code   Allergies  Allergen Reactions  . Oxycodone Other (See Comments)    "makes her crazy"    Chief Complaint  Patient presents with  . Medical Management of Chronic Issues    Spinal stenosis of lumbar region with radiculopathy; compression fracture of L3 vertebra sequela; severe protein calorie malnutrition.   Weekly follow up for the first 30 days post hospitalization     HPI:  She is a 84 year old short term rehab patient being seen for the management of her chronic illness: spinal stenosis; compression fracture; malnutrition. She is having increased right lower extremity pain. She is not getting relief with her current regimen. The pain is interfering with her ability to participate in therapy. She denies any changes in appetite. She is presently on a prednisone taper. She denies any anxiety.   Past Medical History:  Diagnosis Date  . Anemia, unspecified    B12 and iron deficiency   . Arthritis   . B12 deficiency   . Collagen vascular disease ([place])   . Crohn disease ([place])   . Crohn's disease ([place])    followed by Dr. Vira Agar with serial colonoscopies  . Dyspnea   . Fuchs' endothelial dystrophy   . GERD (gastroesophageal reflux disease)   . History of bone density study 09/01/2012  . History of spinal fracture   . Hyperlipidemia, unspecified   . Hypertension   . Hypokalemia   . Hypomagnesemia   . Osteoporosis   . Other dysphagia 01/24/2017  . Pneumonia    1980's  . Protein calorie malnutrition ([place])   . Retinal detachment   . Right shoulder injury 05/2002   treated conservatively  . Trigger finger   . Zenker diverticulum 09/25/2016    Past Surgical History:  Procedure Laterality Date  . APPENDECTOMY  1949  . CATARACT EXTRACTION EXTRACAPSULAR Right 05/10/2015   Procedure: EXTRACTION CATARACT EXTRACAPSULAR W/INSERTION INTRAOCULAR PROSTHESIS; Surgeon: Doyce Para, MD; [facility]; Service: Ophthalmology; Laterality: Right;  . CHOLECYSTECTOMY  1992  . COLONOSCOPY     03/24/1992, 03/11/1998, 07/08/2002, 05/08/2006, 01/31/2012   PH Crohns disease; no repeat due to age per RTE (dw)  . CORNEAL TRANSPLANT Left 07/08/2012   Procedure: CORNEAL TRANSPLANT DSAEK ** tissue ordered OSI 06/27/2012; Surgeon: Doyce Para, MD; Location: [place]; Service: Ophthalmology;  . ESOPHAGOGASTRODUODENOSCOPY     05/01/2005, 01/31/2012 ; No repeat per RTE  . ESOPHAGOGASTRODUODENOSCOPY (EGD) WITH PROPOFOL N/A 05/30/2018   Procedure: ESOPHAGOGASTRODUODENOSCOPY (EGD) WITH PROPOFOL;  Surgeon: Manya Silvas, MD;  Location: [facility] ENDOSCOPY;  Service: Endoscopy;  Laterality: N/A;  . EYE SURGERY  2012, 2013   5 total  . JOINT REPLACEMENT Right 01-28-13  . KYPHOPLASTY N/A 08/12/2018   Procedure: KYPHOPLASTY L3;  Surgeon: Hessie Knows, MD;  Location: [facility] ORS;  Service: Orthopedics;  Laterality: N/A;  . LENSECTOMY PHACOFRAGMENTATION WITH ASPIRATION Left 07/25/2011  . TONSILLECTOMY  1950  . TOTAL HIP ARTHROPLASTY Left 12/23/2017   Procedure: TOTAL HIP ARTHROPLASTY;  Surgeon: Dereck Leep, MD;  Location: [facility] ORS;  Service: Orthopedics;  Laterality: Left;  Marland Kitchen VITREOUS RETINAL SURGERY Left 11/07/2011   PPV/SO  . VITREOUS RETINAL SURGERY Left 04/18/2011   TPPV/TRP/SO/SB/EL/C3F8    Social History   Socioeconomic History  . Marital status: Widowed    Spouse name: Not on file  . Number of children: 2  . Years of education:  college  . Highest education level: Not on file  Occupational History  . Occupation: retired    Comment: Pharmacist, hospital  Social Needs  . Financial resource strain: Not on file  . Food insecurity:    Worry: Not on file    Inability: Not on file  . Transportation needs:    Medical: Not on file    Non-medical: Not on file  Tobacco Use  . Smoking status: Former Smoker    Packs/day: 0.50    Years: 35.00    Pack years: 17.50    Types: Cigarettes     Last attempt to quit: 01/29/1991    Years since quitting: 27.5  . Smokeless tobacco: Never Used  Substance and Sexual Activity  . Alcohol use: Yes    Comment: 3x/yr  . Drug use: No  . Sexual activity: Never  Lifestyle  . Physical activity:    Days per week: Not on file    Minutes per session: Not on file  . Stress: Not on file  Relationships  . Social connections:    Talks on phone: Not on file    Gets together: Not on file    Attends religious service: Not on file    Active member of club or organization: Not on file    Attends meetings of clubs or organizations: Not on file    Relationship status: Not on file  . Intimate partner violence:    Fear of current or ex partner: Not on file    Emotionally abused: Not on file    Physically abused: Not on file    Forced sexual activity: Not on file  Other Topics Concern  . Not on file  Social History Narrative   Widowed   Former smoker   Occasional drink 3 x a year   2 children    Full Code   Family History  Problem Relation Age of Onset  . Hypertension Mother   . Stroke Father   . Hypertension Father   . Arthritis Other   . Prostate cancer Other   . Psoriasis Other   . Stroke Other   . Thyroid disease Other   . Anesthesia problems Neg Hx   . Diabetes Neg Hx   . Glaucoma Neg Hx   . Macular degeneration Neg Hx       VITAL SIGNS BP 133/77   Pulse 63   Temp 98 F (36.7 C)   Resp 18   Ht 5' 2"  (1.575 m)   Wt 159 lb 14.4 oz (72.5 kg)   SpO2 100%   BMI 29.25 kg/m   Outpatient Encounter Medications as of 08/21/2018  Medication Sig  . predniSONE (DELTASONE) 10 MG tablet Take 50 mg by mouth daily with breakfast.  . acetaminophen (TYLENOL) 500 MG tablet Take 500 mg by mouth every 8 (eight) hours as needed for mild pain or moderate pain.   Marland Kitchen alendronate (FOSAMAX) 70 MG tablet Take 1 tablet (70 mg total) by mouth once a week. Take with a full glass of water on an empty stomach.  . Amino Acids-Protein Hydrolys (FEEDING  SUPPLEMENT, PRO-STAT SUGAR FREE 64,) LIQD Take 30 mLs by mouth 2 (two) times daily.  Marland Kitchen aspirin EC 325 MG tablet Take 325 mg by mouth daily.   . budesonide (ENTOCORT EC) 3 MG 24 hr capsule Take 9 mg by mouth daily.   . Calcium Citrate-Vitamin D (CITRACAL MAXIMUM) 315-250 MG-UNIT TABS Take 1 tablet by mouth 3 (three) times daily.  . cholecalciferol (  VITAMIN D) 1000 units tablet Take 1,000 Units by mouth daily.   . Coenzyme Q10 (COQ10) 100 MG CAPS Take 2 capsules by mouth daily.  Marland Kitchen docusate sodium (COLACE) 100 MG capsule Take 1 capsule (100 mg total) by mouth 2 (two) times daily.  Marland Kitchen gabapentin (NEURONTIN) 100 MG capsule Take 2 capsules by mouth 2 (two) times daily. In AM and at noon  . gabapentin (NEURONTIN) 300 MG capsule Take 300 mg by mouth at bedtime.  Marland Kitchen HYDROmorphone (DILAUDID) 2 MG tablet Take 0.5 tablets (1 mg total) by mouth every 12 (twelve) hours as needed for severe pain (For pain unrelieved by hydrocodone).  . iron polysaccharides (NIFEREX) 150 MG capsule Take 150 mg by mouth daily.  Marland Kitchen loperamide (LOPERAMIDE A-D) 2 MG tablet Take 2 mg by mouth 3 (three) times daily.   . magnesium oxide (MAG-OX) 400 (241.3 Mg) MG tablet Take 1 tablet (400 mg total) by mouth daily.  . Multiple Vitamin (MULTIVITAMIN) tablet Take 1 tablet by mouth daily.  Marland Kitchen NEXIUM 40 MG capsule Take 40 mg by mouth daily.   . NON FORMULARY Diet Type:  regular  . Nutritional Supplements (ENSURE ENLIVE PO) Take 1 Bottle by mouth 2 (two) times daily.  . potassium chloride SA (K-DUR,KLOR-CON) 20 MEQ tablet Take 20 mEq by mouth 2 (two) times daily.  . prednisoLONE acetate (PRED FORTE) 1 % ophthalmic suspension Place 1 drop into both eyes at bedtime.  . Probiotic Product (RISA-BID PROBIOTIC) TABS Take 1 tablet by mouth daily.  Marland Kitchen torsemide (DEMADEX) 5 MG tablet Take 5 mg by mouth daily as needed.   . traMADol (ULTRAM) 50 MG tablet Take 1 tablet (50 mg total) by mouth every 6 (six) hours as needed for moderate pain.  . vitamin B-12  (CYANOCOBALAMIN) 1000 MCG tablet Take 1,000 mcg by mouth daily.  . vitamin C (ASCORBIC ACID) 500 MG tablet Take 500 mg by mouth daily.   No facility-administered encounter medications on file as of 08/21/2018.      SIGNIFICANT DIAGNOSTIC EXAMS   PREVIOUS;   07-31-18: right lower extremity venous doppler: No evidence of deep venous thrombosis.  08-07-18: MRI lumbar spine:  1. Deformity of the L3 inferior endplate with moderate marrow edema, suggesting subacute compression fracture. This does appear new compared to the CT from 05/14/2018, allowing for differences in technique. Correlate for any history of recent trauma. 2. Moderate spinal canal and severe bilateral neural foraminal stenosis at L3-4. 3. L4-5 left lateral recess narrowing that may be a source of left L5 radiculopathy. 4. L4-5 mild right and moderate left neural foraminal stenosis.   NO NEW LABS.   LABS REVIEWED: PREVIOUS:   08-07-18: wbc 12.7; hgb 13.0; hct 41.2; mcv 92.2; plt 445; glucose 235; bun 23; create 0.71; k+ 4.5; na++ 135; ca 8.5; liver normal albumin 2.7    TODAY:   08-11-18: glucose 325; bun 23; creat 0.71  k+ 4.5; na++ 135 ca 8.5 liver normal albumin 2.7    Review of Systems  Constitutional: Negative for malaise/fatigue.  Respiratory: Negative for cough and shortness of breath.   Cardiovascular: Negative for chest pain, palpitations and leg swelling.  Gastrointestinal: Negative for abdominal pain, constipation and heartburn.  Musculoskeletal: Positive for myalgias. Negative for back pain and joint pain.       No back pain Has right leg pain   Skin: Negative.   Neurological: Negative for dizziness.  Psychiatric/Behavioral: The patient is not nervous/anxious.     Physical Exam Constitutional:  General: She is not in acute distress.    Appearance: Normal appearance. She is well-developed. She is not diaphoretic.  Neck:     Musculoskeletal: Neck supple.     Thyroid: No thyromegaly.    Cardiovascular:     Rate and Rhythm: Normal rate and regular rhythm.     Pulses: Normal pulses.     Heart sounds: Normal heart sounds.  Pulmonary:     Effort: Pulmonary effort is normal. No respiratory distress.     Breath sounds: Normal breath sounds.  Abdominal:     General: Bowel sounds are normal. There is no distension.     Palpations: Abdomen is soft.     Tenderness: There is no abdominal tenderness.  Musculoskeletal:     Right lower leg: No edema.     Left lower leg: No edema.     Comments: Has right hip tenderness Is able to move all extremities   Lymphadenopathy:     Cervical: No cervical adenopathy.  Skin:    General: Skin is warm and dry.  Neurological:     Mental Status: She is alert and oriented to person, place, and time.  Psychiatric:        Mood and Affect: Mood normal.      ASSESSMENT/ PLAN:  TODAY:   1. Protein calorie malnutrition severe: is stable albumin 2.7 will continue supplements   2. Spinal stenosis of lumbar region with radiculopathy: is worse; will increase her neurontin to 300 mg three times daily   3. Vertebral compression fracture L3: is status post kyphoplasty: will continue therapy as directed will continue ultram 50 mg every 6 hours as needed and  Dilaudid 1 mg twelve hours as needed   PREVIOUS  4. Crohn,s disease: is stable will continue budesonide 9 mg daily   5. Age related osteoporosis with current pathological fracture with routine healing: is stable will continue fosamax weekly is taking calcium and vit d supplements  6. Chronic anemia: hgb 13.0; will continue niferex 150 mg daily   7. GERD without esophagitis: is stable will continue nexium 40 mg daily   8. Hypokalemia: is stable k+ 4.5; will continue k+ 20 meq twice daily   9. Essential benign hypertension: is stable 130/54: will continue asa 325 mg daily    MD is aware of resident's narcotic use and is in agreement with current plan of care. We will attempt to wean  resident as apropriate   Ok Edwards NP [facility]  Contact [phone] Monday through Friday 8am- 5pm  After hours call [phone]

## 2018-08-23 NOTE — Progress Notes (Signed)
Location:    [place] Room Number: 2 Place of Service:  SNF (31)   CODE STATUS: full code   Allergies  Allergen Reactions  . Oxycodone Other (See Comments)    "makes her crazy"    Chief Complaint  Patient presents with  . Acute Visit    care plan meeting     HPI:  We have come together for her routine care plan meeting. She does have family present. Yesterday her gabapentin to 300 mg three times daily and is on a prednisone taper. Her current regimen is ineffective for pain relief at this time. She is being followed by physiatrist at [facility] clinic. We have discussed her going to pain clinic to help her with pain management. She does take imodium on a routine basis; will need to stop her colace. She is willing to try a lidoderm patch to her right knee. She continues to be followed for her chronic illnesses including:  Crohn's disease; spinal stenosis and vertebral compression fracture.  The goal of her care continues to be to return back home.    Past Medical History:  Diagnosis Date  . Anemia, unspecified    B12 and iron deficiency   . Arthritis   . B12 deficiency   . Collagen vascular disease ([place])   . Crohn disease ([place])   . Crohn's disease ([place])    followed by Dr. Vira Agar with serial colonoscopies  . Dyspnea   . Fuchs' endothelial dystrophy   . GERD (gastroesophageal reflux disease)   . History of bone density study 09/01/2012  . History of spinal fracture   . Hyperlipidemia, unspecified   . Hypertension   . Hypokalemia   . Hypomagnesemia   . Osteoporosis   . Other dysphagia 01/24/2017  . Pneumonia    1980's  . Protein calorie malnutrition ([place])   . Retinal detachment   . Right shoulder injury 05/2002   treated conservatively  . Trigger finger   . Zenker diverticulum 09/25/2016    Past Surgical History:  Procedure Laterality Date  . APPENDECTOMY  1949  . CATARACT EXTRACTION EXTRACAPSULAR Right 05/10/2015   Procedure: EXTRACTION CATARACT  EXTRACAPSULAR W/INSERTION INTRAOCULAR PROSTHESIS; Surgeon: Doyce Para, MD; [facility]; Service: Ophthalmology; Laterality: Right;  . CHOLECYSTECTOMY  1992  . COLONOSCOPY     03/24/1992, 03/11/1998, 07/08/2002, 05/08/2006, 01/31/2012   PH Crohns disease; no repeat due to age per RTE (dw)  . CORNEAL TRANSPLANT Left 07/08/2012   Procedure: CORNEAL TRANSPLANT DSAEK ** tissue ordered OSI 06/27/2012; Surgeon: Doyce Para, MD; Location: [place]; Service: Ophthalmology;  . ESOPHAGOGASTRODUODENOSCOPY     05/01/2005, 01/31/2012 ; No repeat per RTE  . ESOPHAGOGASTRODUODENOSCOPY (EGD) WITH PROPOFOL N/A 05/30/2018   Procedure: ESOPHAGOGASTRODUODENOSCOPY (EGD) WITH PROPOFOL;  Surgeon: Manya Silvas, MD;  Location: [facility] ENDOSCOPY;  Service: Endoscopy;  Laterality: N/A;  . EYE SURGERY  2012, 2013   5 total  . JOINT REPLACEMENT Right 01-28-13  . KYPHOPLASTY N/A 08/12/2018   Procedure: KYPHOPLASTY L3;  Surgeon: Hessie Knows, MD;  Location: [facility] ORS;  Service: Orthopedics;  Laterality: N/A;  . LENSECTOMY PHACOFRAGMENTATION WITH ASPIRATION Left 07/25/2011  . TONSILLECTOMY  1950  . TOTAL HIP ARTHROPLASTY Left 12/23/2017   Procedure: TOTAL HIP ARTHROPLASTY;  Surgeon: Dereck Leep, MD;  Location: [facility] ORS;  Service: Orthopedics;  Laterality: Left;  Marland Kitchen VITREOUS RETINAL SURGERY Left 11/07/2011   PPV/SO  . VITREOUS RETINAL SURGERY Left 04/18/2011   TPPV/TRP/SO/SB/EL/C3F8    Social History  Socioeconomic History  . Marital status: Widowed    Spouse name: Not on file  . Number of children: 2  . Years of education: college  . Highest education level: Not on file  Occupational History  . Occupation: retired    Comment: Pharmacist, hospital  Social Needs  . Financial resource strain: Not on file  . Food insecurity:    Worry: Not on file    Inability: Not on file  . Transportation needs:    Medical: Not on file    Non-medical: Not on file  Tobacco Use  . Smoking status: Former Smoker    Packs/day: 0.50     Years: 35.00    Pack years: 17.50    Types: Cigarettes    Last attempt to quit: 01/29/1991    Years since quitting: 27.5  . Smokeless tobacco: Never Used  Substance and Sexual Activity  . Alcohol use: Yes    Comment: 3x/yr  . Drug use: No  . Sexual activity: Never  Lifestyle  . Physical activity:    Days per week: Not on file    Minutes per session: Not on file  . Stress: Not on file  Relationships  . Social connections:    Talks on phone: Not on file    Gets together: Not on file    Attends religious service: Not on file    Active member of club or organization: Not on file    Attends meetings of clubs or organizations: Not on file    Relationship status: Not on file  . Intimate partner violence:    Fear of current or ex partner: Not on file    Emotionally abused: Not on file    Physically abused: Not on file    Forced sexual activity: Not on file  Other Topics Concern  . Not on file  Social History Narrative   Widowed   Former smoker   Occasional drink 3 x a year   2 children    Full Code   Family History  Problem Relation Age of Onset  . Hypertension Mother   . Stroke Father   . Hypertension Father   . Arthritis Other   . Prostate cancer Other   . Psoriasis Other   . Stroke Other   . Thyroid disease Other   . Anesthesia problems Neg Hx   . Diabetes Neg Hx   . Glaucoma Neg Hx   . Macular degeneration Neg Hx       VITAL SIGNS BP 133/77   Pulse 63   Temp 98 F (36.7 C)   Resp 18   Ht 5' 2"  (1.575 m)   Wt 159 lb 14.4 oz (72.5 kg)   SpO2 100%   BMI 29.25 kg/m   Outpatient Encounter Medications as of 08/22/2018  Medication Sig  . acetaminophen (TYLENOL) 500 MG tablet Take 500 mg by mouth every 8 (eight) hours as needed for mild pain or moderate pain.   Marland Kitchen alendronate (FOSAMAX) 70 MG tablet Take 1 tablet (70 mg total) by mouth once a week. Take with a full glass of water on an empty stomach.  . Amino Acids-Protein Hydrolys (FEEDING SUPPLEMENT,  PRO-STAT SUGAR FREE 64,) LIQD Take 30 mLs by mouth 2 (two) times daily.  Marland Kitchen aspirin EC 325 MG tablet Take 325 mg by mouth daily.   . budesonide (ENTOCORT EC) 3 MG 24 hr capsule Take 9 mg by mouth daily.   . Calcium Citrate-Vitamin D (CITRACAL MAXIMUM) 315-250 MG-UNIT TABS Take  1 tablet by mouth 3 (three) times daily.  . cholecalciferol (VITAMIN D) 1000 units tablet Take 1,000 Units by mouth daily.   . Coenzyme Q10 (COQ10) 100 MG CAPS Take 2 capsules by mouth daily.  Marland Kitchen docusate sodium (COLACE) 100 MG capsule Take 1 capsule (100 mg total) by mouth 2 (two) times daily.  Marland Kitchen HYDROmorphone (DILAUDID) 2 MG tablet Take 0.5 tablets (1 mg total) by mouth every 12 (twelve) hours as needed for severe pain (For pain unrelieved by hydrocodone).  . iron polysaccharides (NIFEREX) 150 MG capsule Take 150 mg by mouth daily.  Marland Kitchen loperamide (LOPERAMIDE A-D) 2 MG tablet Take 2 mg by mouth 3 (three) times daily.   . magnesium oxide (MAG-OX) 400 (241.3 Mg) MG tablet Take 1 tablet (400 mg total) by mouth daily.  . Multiple Vitamin (MULTIVITAMIN) tablet Take 1 tablet by mouth daily.  Marland Kitchen NEXIUM 40 MG capsule Take 40 mg by mouth daily.   . NON FORMULARY Diet Type:  regular  . Nutritional Supplements (ENSURE ENLIVE PO) Take 1 Bottle by mouth 2 (two) times daily.  . potassium chloride SA (K-DUR,KLOR-CON) 20 MEQ tablet Take 20 mEq by mouth 2 (two) times daily.  . prednisoLONE acetate (PRED FORTE) 1 % ophthalmic suspension Place 1 drop into both eyes at bedtime.  . predniSONE (DELTASONE) 10 MG tablet Take 50 mg by mouth daily with breakfast.  . Probiotic Product (RISA-BID PROBIOTIC) TABS Take 1 tablet by mouth daily.  Marland Kitchen torsemide (DEMADEX) 5 MG tablet Take 5 mg by mouth daily as needed.   . traMADol (ULTRAM) 50 MG tablet Take 1 tablet (50 mg total) by mouth every 6 (six) hours as needed for moderate pain.  . vitamin B-12 (CYANOCOBALAMIN) 1000 MCG tablet Take 1,000 mcg by mouth daily.  . vitamin C (ASCORBIC ACID) 500 MG tablet  Take 500 mg by mouth daily.  . [DISCONTINUED] gabapentin (NEURONTIN) 100 MG capsule Take 2 capsules by mouth 2 (two) times daily. In AM and at noon  . [DISCONTINUED] gabapentin (NEURONTIN) 300 MG capsule Take 300 mg by mouth at bedtime.   No facility-administered encounter medications on file as of 08/22/2018.      SIGNIFICANT DIAGNOSTIC EXAMS   PREVIOUS;   07-31-18: right lower extremity venous doppler: No evidence of deep venous thrombosis.  08-07-18: MRI lumbar spine:  1. Deformity of the L3 inferior endplate with moderate marrow edema, suggesting subacute compression fracture. This does appear new compared to the CT from 05/14/2018, allowing for differences in technique. Correlate for any history of recent trauma. 2. Moderate spinal canal and severe bilateral neural foraminal stenosis at L3-4. 3. L4-5 left lateral recess narrowing that may be a source of left L5 radiculopathy. 4. L4-5 mild right and moderate left neural foraminal stenosis.   NO NEW LABS.   LABS REVIEWED: PREVIOUS:   08-07-18: wbc 12.7; hgb 13.0; hct 41.2; mcv 92.2; plt 445; glucose 235; bun 23; create 0.71; k+ 4.5; na++ 135; ca 8.5; liver normal albumin 2.7  08-11-18: glucose 325; bun 23; creat 0.71  k+ 4.5; na++ 135 ca 8.5 liver normal albumin 2.7   NO NEW LABS.    Review of Systems  Constitutional: Negative for malaise/fatigue.  Respiratory: Negative for cough and shortness of breath.   Cardiovascular: Negative for chest pain, palpitations and leg swelling.  Gastrointestinal: Negative for abdominal pain, constipation and heartburn.  Musculoskeletal: Positive for joint pain and myalgias. Negative for back pain.       Right leg pain and right knee  pain   Skin: Negative.   Neurological: Negative for dizziness.  Psychiatric/Behavioral: The patient is not nervous/anxious.     Physical Exam Constitutional:      General: She is not in acute distress.    Appearance: Normal appearance. She is  well-developed. She is not diaphoretic.  Neck:     Musculoskeletal: Neck supple.     Thyroid: No thyromegaly.  Cardiovascular:     Rate and Rhythm: Normal rate and regular rhythm.     Pulses: Normal pulses.     Heart sounds: Normal heart sounds.  Pulmonary:     Effort: Pulmonary effort is normal. No respiratory distress.     Breath sounds: Normal breath sounds.  Abdominal:     General: Bowel sounds are normal. There is no distension.     Palpations: Abdomen is soft.     Tenderness: There is no abdominal tenderness.  Musculoskeletal:     Right lower leg: No edema.     Left lower leg: No edema.     Comments: Has right hip and knee  tenderness Is able to move all extremities   Lymphadenopathy:     Cervical: No cervical adenopathy.  Skin:    General: Skin is warm and dry.  Neurological:     Mental Status: She is alert and oriented to person, place, and time.      ASSESSMENT/ PLAN:  TODAY:    1. Crohn's disease  2. Spinal stenosis of lumbar region 3. L3 compression fracture  Will continue her predisone taper over the weekend if her pain does not improve; will consider stopping prednisone  Will begin lidoderm 5% patch to right knee.    MD is aware of resident's narcotic use and is in agreement with current plan of care. We will attempt to wean resident as apropriate   Ok Edwards NP [facility]  Contact [phone] Monday through Friday 8am- 5pm  After hours call [phone]

## 2018-08-24 ENCOUNTER — Other Ambulatory Visit
Admission: RE | Admit: 2018-08-24 | Discharge: 2018-08-24 | Disposition: A | Discharge status: Home / Self Care | Payer: Medicare Other | Source: Ambulatory Visit | Attending: Internal Medicine | Admitting: Internal Medicine

## 2018-08-24 DIAGNOSIS — R300 Dysuria: Secondary | ICD-10-CM | POA: Diagnosis present

## 2018-08-24 DIAGNOSIS — R3915 Urgency of urination: Secondary | ICD-10-CM | POA: Diagnosis not present

## 2018-08-24 DIAGNOSIS — R32 Unspecified urinary incontinence: Secondary | ICD-10-CM | POA: Insufficient documentation

## 2018-08-24 LAB — URINALYSIS, COMPLETE (UACMP) WITH MICROSCOPIC
BACTERIA UA: NONE SEEN
Bilirubin Urine: NEGATIVE
Glucose, UA: NEGATIVE mg/dL
Hgb urine dipstick: NEGATIVE
Ketones, ur: NEGATIVE mg/dL
Leukocytes, UA: NEGATIVE
Nitrite: NEGATIVE
Protein, ur: NEGATIVE mg/dL
SQUAMOUS EPITHELIAL / LPF: NONE SEEN (ref 0–5)
Specific Gravity, Urine: 1.006 (ref 1.005–1.030)
WBC, UA: NONE SEEN WBC/hpf (ref 0–5)
pH: 6 (ref 5.0–8.0)

## 2018-08-25 ENCOUNTER — Non-Acute Institutional Stay (SKILLED_NURSING_FACILITY): Payer: Medicare Other | Admitting: Adult Health

## 2018-08-25 ENCOUNTER — Encounter: Payer: Self-pay | Admitting: Adult Health

## 2018-08-25 DIAGNOSIS — M48061 Spinal stenosis, lumbar region without neurogenic claudication: Secondary | ICD-10-CM | POA: Diagnosis not present

## 2018-08-25 DIAGNOSIS — S32030S Wedge compression fracture of third lumbar vertebra, sequela: Secondary | ICD-10-CM

## 2018-08-25 DIAGNOSIS — M5416 Radiculopathy, lumbar region: Secondary | ICD-10-CM

## 2018-08-25 LAB — URINE CULTURE: Culture: <10000 — AB

## 2018-08-25 NOTE — Progress Notes (Signed)
[facility]:   [facility] Room Number: 206 A Place of Service:  SNF (31)   CODE STATUS: Full Code  Allergies  Allergen Reactions  . Oxycodone Other (See Comments)    "makes her crazy"    Chief Complaint  Patient presents with  . Acute Visit    Pain Management    HPI:  She had recently been placed on a prednisone taper for her pain management without little relief. She has recently had her neurontin increased to 300 mg three times with a lidoderm patch to her right knee. She tells me that her pain is improving; she was able to participate in therapy today and was able to ambulate with therapy she feels as though her current regimen is effective. We have discussed her prednisone and will lower her off the prednisone over the next couple days.    Past Medical History:  Diagnosis Date  . Anemia, unspecified    B12 and iron deficiency   . Arthritis   . B12 deficiency   . Collagen vascular disease ([place])   . Crohn disease ([place])   . Crohn's disease ([place])    followed by Dr. Vira Agar with serial colonoscopies  . Dyspnea   . Fuchs' endothelial dystrophy   . GERD (gastroesophageal reflux disease)   . History of bone density study 09/01/2012  . History of spinal fracture   . Hyperlipidemia, unspecified   . Hypertension   . Hypokalemia   . Hypomagnesemia   . Osteoporosis   . Other dysphagia 01/24/2017  . Pneumonia    1980's  . Protein calorie malnutrition ([place])   . Retinal detachment   . Right shoulder injury 05/2002   treated conservatively  . Trigger finger   . Zenker diverticulum 09/25/2016    Past Surgical History:  Procedure Laterality Date  . APPENDECTOMY  1949  . CATARACT EXTRACTION EXTRACAPSULAR Right 05/10/2015   Procedure: EXTRACTION CATARACT EXTRACAPSULAR W/INSERTION INTRAOCULAR PROSTHESIS; Surgeon: Doyce Para, MD; [facility]; Service: Ophthalmology; Laterality: Right;  . CHOLECYSTECTOMY  1992  . COLONOSCOPY     03/24/1992, 03/11/1998, 07/08/2002, 05/08/2006, 01/31/2012   PH Crohns disease; no repeat due to age per RTE (dw)  . CORNEAL TRANSPLANT Left 07/08/2012   Procedure: CORNEAL TRANSPLANT DSAEK ** tissue ordered OSI 06/27/2012; Surgeon: Doyce Para, MD; Location: [place]; Service: Ophthalmology;  . ESOPHAGOGASTRODUODENOSCOPY     05/01/2005, 01/31/2012 ; No repeat per RTE  . ESOPHAGOGASTRODUODENOSCOPY (EGD) WITH PROPOFOL N/A 05/30/2018   Procedure: ESOPHAGOGASTRODUODENOSCOPY (EGD) WITH PROPOFOL;  Surgeon: Manya Silvas, MD;  Location: [facility] ENDOSCOPY;  Service: Endoscopy;  Laterality: N/A;  . EYE SURGERY  2012, 2013   5 total  . JOINT REPLACEMENT Right 01-28-13  . KYPHOPLASTY N/A 08/12/2018   Procedure: KYPHOPLASTY L3;  Surgeon: Hessie Knows, MD;  Location: [facility] ORS;  Service: Orthopedics;  Laterality: N/A;  . LENSECTOMY PHACOFRAGMENTATION WITH ASPIRATION Left 07/25/2011  . TONSILLECTOMY  1950  . TOTAL HIP ARTHROPLASTY Left 12/23/2017   Procedure: TOTAL HIP ARTHROPLASTY;  Surgeon: Dereck Leep, MD;  Location: [facility] ORS;  Service: Orthopedics;  Laterality: Left;  Marland Kitchen VITREOUS RETINAL SURGERY Left 11/07/2011   PPV/SO  . VITREOUS RETINAL SURGERY Left 04/18/2011   TPPV/TRP/SO/SB/EL/C3F8    Social History   Socioeconomic History  . Marital status: Widowed    Spouse name: Not on file  . Number of children: 2  . Years of education: college  . Highest education level: Not on file  Occupational History  .  Occupation: retired    Comment: Pharmacist, hospital  Social Needs  . Financial resource strain: Not on file  . Food insecurity:    Worry: Not on file    Inability: Not on file  . Transportation needs:    Medical: Not on file    Non-medical: Not on file  Tobacco Use  . Smoking status: Former Smoker    Packs/day: 0.50    Years: 35.00    Pack years: 17.50    Types: Cigarettes    Last attempt to quit: 01/29/1991    Years since quitting: 27.5  . Smokeless tobacco: Never Used  Substance and Sexual  Activity  . Alcohol use: Yes    Comment: 3x/yr  . Drug use: No  . Sexual activity: Never  Lifestyle  . Physical activity:    Days per week: Not on file    Minutes per session: Not on file  . Stress: Not on file  Relationships  . Social connections:    Talks on phone: Not on file    Gets together: Not on file    Attends religious service: Not on file    Active member of club or organization: Not on file    Attends meetings of clubs or organizations: Not on file    Relationship status: Not on file  . Intimate partner violence:    Fear of current or ex partner: Not on file    Emotionally abused: Not on file    Physically abused: Not on file    Forced sexual activity: Not on file  Other Topics Concern  . Not on file  Social History Narrative   Widowed   Former smoker   Occasional drink 3 x a year   2 children    Full Code   Family History  Problem Relation Age of Onset  . Hypertension Mother   . Stroke Father   . Hypertension Father   . Arthritis Other   . Prostate cancer Other   . Psoriasis Other   . Stroke Other   . Thyroid disease Other   . Anesthesia problems Neg Hx   . Diabetes Neg Hx   . Glaucoma Neg Hx   . Macular degeneration Neg Hx       VITAL SIGNS BP (!) 126/58   Pulse 96   Temp 97.9 F (36.6 C)   Resp 18   Ht 5' 2"  (1.575 m)   Wt 168 lb 6.4 oz (76.4 kg)   SpO2 96%   BMI 30.80 kg/m   Outpatient Encounter Medications as of 08/25/2018  Medication Sig  . acetaminophen (TYLENOL) 500 MG tablet Take 500 mg by mouth every 8 (eight) hours as needed for mild pain or moderate pain.   Marland Kitchen alendronate (FOSAMAX) 70 MG tablet Take 1 tablet (70 mg total) by mouth once a week. Take with a full glass of water on an empty stomach.  . Amino Acids-Protein Hydrolys (FEEDING SUPPLEMENT, PRO-STAT SUGAR FREE 64,) LIQD Take 30 mLs by mouth 2 (two) times daily.  Marland Kitchen aspirin EC 325 MG tablet Take 325 mg by mouth daily.   . budesonide (ENTOCORT EC) 3 MG 24 hr capsule Take 9  mg by mouth daily.   . Calcium Citrate-Vitamin D (CITRACAL MAXIMUM) 315-250 MG-UNIT TABS Take 1 tablet by mouth 3 (three) times daily.  . cholecalciferol (VITAMIN D) 1000 units tablet Take 1,000 Units by mouth daily.   . Coenzyme Q10 (COQ10) 100 MG CAPS Take 2 capsules by mouth daily.  Marland Kitchen  gabapentin (NEURONTIN) 300 MG capsule Take 300 mg by mouth 3 (three) times daily.  Marland Kitchen HYDROmorphone (DILAUDID) 2 MG tablet Take 0.5 tablets (1 mg total) by mouth every 12 (twelve) hours as needed for severe pain (For pain unrelieved by hydrocodone).  . iron polysaccharides (NIFEREX) 150 MG capsule Take 150 mg by mouth daily.  Marland Kitchen lidocaine (LIDODERM) 5 % Apply topically to right knee in the AM and Remove & Discard patch at night within 12 hours or as directed by MD for uncontrolled knee pain  . loperamide (LOPERAMIDE A-D) 2 MG tablet Take 2 mg by mouth 3 (three) times daily.   . magnesium oxide (MAG-OX) 400 (241.3 Mg) MG tablet Take 1 tablet (400 mg total) by mouth daily.  . Multiple Vitamin (MULTIVITAMIN) tablet Take 1 tablet by mouth daily.  Marland Kitchen NEXIUM 40 MG capsule Take 40 mg by mouth daily.   . NON FORMULARY Diet Type:  regular  . Nutritional Supplements (ENSURE ENLIVE PO) Take 1 Bottle by mouth 2 (two) times daily.  . potassium chloride SA (K-DUR,KLOR-CON) 20 MEQ tablet Take 20 mEq by mouth 2 (two) times daily.  . prednisoLONE acetate (PRED FORTE) 1 % ophthalmic suspension Place 1 drop into both eyes at bedtime.  . predniSONE (DELTASONE) 10 MG tablet Take by mouth as directed: 08/24/18 - 08/27/18 - take 40 mg by mouth daily 08/28/18 - 08/31/18 - take 30 mg by mouth daily 09/01/18 - 09/04/18 - take 20 mg by mouth daily 09/05/18 - 09/08/18 - take 10 mg by mouth daily  . Probiotic Product (RISA-BID PROBIOTIC) TABS Take 1 tablet by mouth daily.  Marland Kitchen torsemide (DEMADEX) 5 MG tablet Take 5 mg by mouth daily as needed.   . traMADol (ULTRAM) 50 MG tablet Take 1 tablet (50 mg total) by mouth every 6 (six) hours as needed for  moderate pain.  . vitamin B-12 (CYANOCOBALAMIN) 1000 MCG tablet Take 1,000 mcg by mouth daily.  . vitamin C (ASCORBIC ACID) 500 MG tablet Take 500 mg by mouth daily.  . [DISCONTINUED] docusate sodium (COLACE) 100 MG capsule Take 1 capsule (100 mg total) by mouth 2 (two) times daily. (Patient not taking: Reported on 08/25/2018)   No facility-administered encounter medications on file as of 08/25/2018.      SIGNIFICANT DIAGNOSTIC EXAMS  PREVIOUS;   07-31-18: right lower extremity venous doppler: No evidence of deep venous thrombosis.  08-07-18: MRI lumbar spine:  1. Deformity of the L3 inferior endplate with moderate marrow edema, suggesting subacute compression fracture. This does appear new compared to the CT from 05/14/2018, allowing for differences in technique. Correlate for any history of recent trauma. 2. Moderate spinal canal and severe bilateral neural foraminal stenosis at L3-4. 3. L4-5 left lateral recess narrowing that may be a source of left L5 radiculopathy. 4. L4-5 mild right and moderate left neural foraminal stenosis.   NO NEW LABS.   LABS REVIEWED: PREVIOUS:   08-07-18: wbc 12.7; hgb 13.0; hct 41.2; mcv 92.2; plt 445; glucose 235; bun 23; create 0.71; k+ 4.5; na++ 135; ca 8.5; liver normal albumin 2.7  08-11-18: glucose 325; bun 23; creat 0.71  k+ 4.5; na++ 135 ca 8.5 liver normal albumin 2.7   NO NEW LABS.    Review of Systems  Constitutional: Negative for malaise/fatigue.  Respiratory: Negative for cough and shortness of breath.   Cardiovascular: Negative for chest pain, palpitations and leg swelling.  Gastrointestinal: Negative for abdominal pain, constipation and heartburn.  Musculoskeletal: Negative for back pain, joint pain  and myalgias.       Her right leg and knee pain is being managed   Skin: Negative.   Neurological: Negative for dizziness.  Psychiatric/Behavioral: The patient is not nervous/anxious.     Physical Exam Constitutional:       General: She is not in acute distress.    Appearance: She is well-developed. She is not diaphoretic.  Neck:     Musculoskeletal: Neck supple.     Thyroid: No thyromegaly.  Cardiovascular:     Rate and Rhythm: Normal rate and regular rhythm.     Pulses: Normal pulses.     Heart sounds: Normal heart sounds.  Pulmonary:     Effort: Pulmonary effort is normal. No respiratory distress.     Breath sounds: Normal breath sounds.  Abdominal:     General: Bowel sounds are normal. There is no distension.     Palpations: Abdomen is soft.     Tenderness: There is no abdominal tenderness.  Musculoskeletal:     Right lower leg: No edema.     Left lower leg: No edema.     Comments: Is able to move all extremities   Lymphadenopathy:     Cervical: No cervical adenopathy.  Skin:    General: Skin is warm and dry.  Neurological:     Mental Status: She is alert and oriented to person, place, and time.  Psychiatric:        Mood and Affect: Mood normal.       ASSESSMENT/ PLAN:  TODAY:    1. Spinal stenosis of lumbar region 2. L3 compression fracture   Will lower her prednisone to 20 mg daily and will stop on 08-29-18 Will continue her current pain regimen and will monitor     MD is aware of resident's narcotic use and is in agreement with current plan of care. We will attempt to wean resident as apropriate   Ok Edwards NP [facility]  Contact [phone] Monday through Friday 8am- 5pm  After hours call [phone]

## 2018-08-28 ENCOUNTER — Non-Acute Institutional Stay (SKILLED_NURSING_FACILITY): Payer: Medicare Other | Admitting: Adult Health

## 2018-08-28 ENCOUNTER — Encounter: Payer: Self-pay | Admitting: Adult Health

## 2018-08-28 ENCOUNTER — Encounter
Admission: RE | Admit: 2018-08-28 | Discharge: 2018-08-28 | Disposition: A | Discharge status: Home / Self Care | Payer: Medicare Other | Source: Ambulatory Visit | Attending: Internal Medicine | Admitting: Internal Medicine

## 2018-08-28 DIAGNOSIS — K5080 Crohn's disease of both small and large intestine without complications: Secondary | ICD-10-CM

## 2018-08-28 DIAGNOSIS — K219 Gastro-esophageal reflux disease without esophagitis: Secondary | ICD-10-CM | POA: Diagnosis not present

## 2018-08-28 DIAGNOSIS — M8000XD Age-related osteoporosis with current pathological fracture, unspecified site, subsequent encounter for fracture with routine healing: Secondary | ICD-10-CM | POA: Diagnosis not present

## 2018-08-28 DIAGNOSIS — D649 Anemia, unspecified: Secondary | ICD-10-CM | POA: Diagnosis not present

## 2018-08-28 NOTE — Progress Notes (Signed)
Location:   [facility] Room Number: 206 A Place of Service:  SNF (31)   CODE STATUS: Full Code  Allergies  Allergen Reactions  . Oxycodone Other (See Comments)    "makes her crazy"    Chief Complaint  Patient presents with  . Medical Management of Chronic Issues    Age-related osteoporosis with current pathological fracture with routine healing; crohn's disease of both small and large intestine without complication; gerd without esophagitis; chronic amenia. Weekly follow up for the first 30 days post hospitalization.     HPI:  She is 85 year old short term rehab patient 85 being seen for the management of her chronic illnesses: osteoporosis; crohn's disease; gerd; anemia. She denies any uncontrolled right knee pain is using ice packs as needed; is participating in therapy. She denies any heart burn; no uncontrolled diarrhea. She had been hospitalized for a L3 compression fracture.   Past Medical History:  Diagnosis Date  . Anemia, unspecified    B12 and iron deficiency   . Arthritis   . B12 deficiency   . Collagen vascular disease ([place])   . Crohn disease ([place])   . Crohn's disease ([place])    followed by Dr. Vira Agar with serial colonoscopies  . Dyspnea   . Fuchs' endothelial dystrophy   . GERD (gastroesophageal reflux disease)   . History of bone density study 09/01/2012  . History of spinal fracture   . Hyperlipidemia, unspecified   . Hypertension   . Hypokalemia   . Hypomagnesemia   . Osteoporosis   . Other dysphagia 01/24/2017  . Pneumonia    1980's  . Protein calorie malnutrition ([place])   . Retinal detachment   . Right shoulder injury 05/2002   treated conservatively  . Trigger finger   . Zenker diverticulum 09/25/2016    Past Surgical History:  Procedure Laterality Date  . APPENDECTOMY  1949  . CATARACT EXTRACTION EXTRACAPSULAR Right 05/10/2015   Procedure: EXTRACTION CATARACT EXTRACAPSULAR W/INSERTION INTRAOCULAR PROSTHESIS;  Surgeon: Doyce Para, MD; [facility]; Service: Ophthalmology; Laterality: Right;  . CHOLECYSTECTOMY  1992  . COLONOSCOPY     03/24/1992, 03/11/1998, 07/08/2002, 05/08/2006, 01/31/2012   PH Crohns disease; no repeat due to age per RTE (dw)  . CORNEAL TRANSPLANT Left 07/08/2012   Procedure: CORNEAL TRANSPLANT DSAEK ** tissue ordered OSI 06/27/2012; Surgeon: Doyce Para, MD; Location: [place]; Service: Ophthalmology;  . ESOPHAGOGASTRODUODENOSCOPY     05/01/2005, 01/31/2012 ; No repeat per RTE  . ESOPHAGOGASTRODUODENOSCOPY (EGD) WITH PROPOFOL N/A 05/30/2018   Procedure: ESOPHAGOGASTRODUODENOSCOPY (EGD) WITH PROPOFOL;  Surgeon: Manya Silvas, MD;  Location: [facility] ENDOSCOPY;  Service: Endoscopy;  Laterality: N/A;  . EYE SURGERY  2012, 2013   5 total  . JOINT REPLACEMENT Right 01-28-13  . KYPHOPLASTY N/A 08/12/2018   Procedure: KYPHOPLASTY L3;  Surgeon: Hessie Knows, MD;  Location: [facility] ORS;  Service: Orthopedics;  Laterality: N/A;  . LENSECTOMY PHACOFRAGMENTATION WITH ASPIRATION Left 07/25/2011  . TONSILLECTOMY  1950  . TOTAL HIP ARTHROPLASTY Left 12/23/2017   Procedure: TOTAL HIP ARTHROPLASTY;  Surgeon: Dereck Leep, MD;  Location: [facility] ORS;  Service: Orthopedics;  Laterality: Left;  Marland Kitchen VITREOUS RETINAL SURGERY Left 11/07/2011   PPV/SO  . VITREOUS RETINAL SURGERY Left 04/18/2011   TPPV/TRP/SO/SB/EL/C3F8    Social History   Socioeconomic History  . Marital status: Widowed    Spouse name: Not on file  . Number of children: 2  . Years of education: college  . Highest education  level: Not on file  Occupational History  . Occupation: retired    Comment: Pharmacist, hospital  Social Needs  . Financial resource strain: Not on file  . Food insecurity:    Worry: Not on file    Inability: Not on file  . Transportation needs:    Medical: Not on file    Non-medical: Not on file  Tobacco Use  . Smoking status: Former Smoker    Packs/day: 0.50    Years: 35.00    Pack years: 17.50    Types:  Cigarettes    Last attempt to quit: 01/29/1991    Years since quitting: 27.5  . Smokeless tobacco: Never Used  Substance and Sexual Activity  . Alcohol use: Yes    Comment: 3x/yr  . Drug use: No  . Sexual activity: Never  Lifestyle  . Physical activity:    Days per week: Not on file    Minutes per session: Not on file  . Stress: Not on file  Relationships  . Social connections:    Talks on phone: Not on file    Gets together: Not on file    Attends religious service: Not on file    Active member of club or organization: Not on file    Attends meetings of clubs or organizations: Not on file    Relationship status: Not on file  . Intimate partner violence:    Fear of current or ex partner: Not on file    Emotionally abused: Not on file    Physically abused: Not on file    Forced sexual activity: Not on file  Other Topics Concern  . Not on file  Social History Narrative   Widowed   Former smoker   Occasional drink 3 x a year   2 children    Full Code   Family History  Problem Relation Age of Onset  . Hypertension Mother   . Stroke Father   . Hypertension Father   . Arthritis Other   . Prostate cancer Other   . Psoriasis Other   . Stroke Other   . Thyroid disease Other   . Anesthesia problems Neg Hx   . Diabetes Neg Hx   . Glaucoma Neg Hx   . Macular degeneration Neg Hx       VITAL SIGNS BP (!) 149/75   Pulse 82   Temp 97.7 F (36.5 C)   Resp 20   Ht 5' 2"  (1.575 m)   Wt 168 lb 6.4 oz (76.4 kg)   SpO2 96%   BMI 30.80 kg/m   Outpatient Encounter Medications as of 08/28/2018  Medication Sig  . acetaminophen (TYLENOL) 500 MG tablet Take 500 mg by mouth every 8 (eight) hours as needed for mild pain or moderate pain.   Marland Kitchen alendronate (FOSAMAX) 70 MG tablet Take 1 tablet (70 mg total) by mouth once a week. Take with a full glass of water on an empty stomach.  . Amino Acids-Protein Hydrolys (FEEDING SUPPLEMENT, PRO-STAT SUGAR FREE 64,) LIQD Take 30 mLs by mouth 2  (two) times daily.  Marland Kitchen aspirin EC 325 MG tablet Take 325 mg by mouth daily.   . budesonide (ENTOCORT EC) 3 MG 24 hr capsule Take 9 mg by mouth daily.   . Calcium Citrate-Vitamin D (CITRACAL MAXIMUM) 315-250 MG-UNIT TABS Take 1 tablet by mouth 3 (three) times daily.  . cholecalciferol (VITAMIN D) 1000 units tablet Take 1,000 Units by mouth daily.   . Coenzyme Q10 (COQ10) 100 MG  CAPS Take 2 capsules by mouth daily.  Marland Kitchen gabapentin (NEURONTIN) 300 MG capsule Take 300 mg by mouth 3 (three) times daily.  Marland Kitchen HYDROmorphone (DILAUDID) 2 MG tablet Take 0.5 tablets (1 mg total) by mouth every 12 (twelve) hours as needed for severe pain (For pain unrelieved by hydrocodone).  . iron polysaccharides (NIFEREX) 150 MG capsule Take 150 mg by mouth daily.  Marland Kitchen lidocaine (LIDODERM) 5 % Apply topically to right knee in the AM and Remove & Discard patch at night within 12 hours or as directed by MD for uncontrolled knee pain  . loperamide (LOPERAMIDE A-D) 2 MG tablet Take 2 mg by mouth 3 (three) times daily.   . magnesium oxide (MAG-OX) 400 (241.3 Mg) MG tablet Take 1 tablet (400 mg total) by mouth daily.  . Multiple Vitamin (MULTIVITAMIN) tablet Take 1 tablet by mouth daily.  Marland Kitchen NEXIUM 40 MG capsule Take 40 mg by mouth daily.   . NON FORMULARY Diet Type:  regular  . Nutritional Supplements (ENSURE ENLIVE PO) Take 1 Bottle by mouth 2 (two) times daily.  . potassium chloride SA (K-DUR,KLOR-CON) 20 MEQ tablet Take 20 mEq by mouth 2 (two) times daily.  . prednisoLONE acetate (PRED FORTE) 1 % ophthalmic suspension Place 1 drop into both eyes at bedtime.  . predniSONE (DELTASONE) 20 MG tablet   . Probiotic Product (RISA-BID PROBIOTIC) TABS Take 1 tablet by mouth daily.  Marland Kitchen torsemide (DEMADEX) 5 MG tablet Take 5 mg by mouth daily as needed.   . traMADol (ULTRAM) 50 MG tablet Take 1 tablet (50 mg total) by mouth every 6 (six) hours as needed for moderate pain.  . vitamin B-12 (CYANOCOBALAMIN) 1000 MCG tablet Take 1,000 mcg by  mouth daily.  . vitamin C (ASCORBIC ACID) 500 MG tablet Take 500 mg by mouth daily.   No facility-administered encounter medications on file as of 08/28/2018.      SIGNIFICANT DIAGNOSTIC EXAMS  PREVIOUS;   07-31-18: right lower extremity venous doppler: No evidence of deep venous thrombosis.  08-07-18: MRI lumbar spine:  1. Deformity of the L3 inferior endplate with moderate marrow edema, suggesting subacute compression fracture. This does appear new compared to the CT from 05/14/2018, allowing for differences in technique. Correlate for any history of recent trauma. 2. Moderate spinal canal and severe bilateral neural foraminal stenosis at L3-4. 3. L4-5 left lateral recess narrowing that may be a source of left L5 radiculopathy. 4. L4-5 mild right and moderate left neural foraminal stenosis.   NO NEW LABS.   LABS REVIEWED: PREVIOUS:   08-07-18: wbc 12.7; hgb 13.0; hct 41.2; mcv 92.2; plt 445; glucose 235; bun 23; create 0.71; k+ 4.5; na++ 135; ca 8.5; liver normal albumin 2.7  08-11-18: glucose 325; bun 23; creat 0.71  k+ 4.5; na++ 135 ca 8.5 liver normal albumin 2.7   TODAY ;  08-24-18: urine culture: <10,000 colonies    Review of Systems  Constitutional: Negative.  Negative for malaise/fatigue.  Respiratory: Negative for cough and shortness of breath.   Cardiovascular: Negative for chest pain, palpitations and leg swelling.  Gastrointestinal: Negative for abdominal pain, constipation and heartburn.  Musculoskeletal: Positive for joint pain. Negative for back pain and myalgias.       Right pain is improving is using ice packs as needed   Skin: Negative.   Neurological: Negative for dizziness.  Psychiatric/Behavioral: The patient is not nervous/anxious.      Physical Exam Constitutional:      General: She is not in  acute distress.    Appearance: Normal appearance. She is well-developed. She is not diaphoretic.  Neck:     Musculoskeletal: Neck supple.     Thyroid: No  thyromegaly.  Cardiovascular:     Rate and Rhythm: Normal rate and regular rhythm.     Pulses: Normal pulses.     Heart sounds: Normal heart sounds.  Pulmonary:     Effort: Pulmonary effort is normal. No respiratory distress.     Breath sounds: Normal breath sounds.  Abdominal:     General: Bowel sounds are normal. There is no distension.     Palpations: Abdomen is soft.     Tenderness: There is no abdominal tenderness.  Musculoskeletal:     Right lower leg: No edema.     Left lower leg: No edema.     Comments: Is able to move all extremities   Lymphadenopathy:     Cervical: No cervical adenopathy.  Skin:    General: Skin is warm and dry.  Neurological:     Mental Status: She is alert and oriented to person, place, and time.       ASSESSMENT/ PLAN:  TODAY:   1. Crohn's disease: is stable will continue budesonide 9 mg daily and imodium 2 mg three times daily   2. Age related osteoporosis with current pathological fracture with routine healing: is stable will continue fosamax weekly is taking calcium and vit d supplements  3. Chronic anemia: hgb 13.0; will continue niferex 150 mg daily   4. GERD without esophagitis: is stable will continue nexium 40 mg daily   PREVIOUS  5. Hypokalemia: is stable k+ 4.5; will continue k+ 20 meq twice daily   6. Essential benign hypertension: is stable 149/75: will continue asa 325 mg daily   7. Arthritis right knee: pain is managed: will continue lidoderm patch 5 % daily is using ice pack as needed   8. Protein calorie malnutrition severe: is stable albumin 2.7 will continue supplements   9. Spinal stenosis of lumbar region with radiculopathy: is stable; will continue neurontin  300 mg three times daily   10. Vertebral compression fracture L3: is status post kyphoplasty: will continue therapy as directed will continue ultram 50 mg every 6 hours as needed and  Dilaudid 1 mg twelve hours as needed      MD is aware of resident's  narcotic use and is in agreement with current plan of care. We will attempt to wean resident as apropriate   Ok Edwards NP [facility]  Contact [phone] Monday through Friday 8am- 5pm  After hours call [phone]

## 2018-09-01 ENCOUNTER — Encounter: Payer: Self-pay | Admitting: Adult Health

## 2018-09-01 ENCOUNTER — Non-Acute Institutional Stay (SKILLED_NURSING_FACILITY): Payer: Medicare Other | Admitting: Adult Health

## 2018-09-01 DIAGNOSIS — M1711 Unilateral primary osteoarthritis, right knee: Secondary | ICD-10-CM | POA: Diagnosis not present

## 2018-09-01 DIAGNOSIS — S32030S Wedge compression fracture of third lumbar vertebra, sequela: Secondary | ICD-10-CM | POA: Diagnosis not present

## 2018-09-01 NOTE — Progress Notes (Signed)
[facility]   [facility] Room Number: 206 A Place of Service:  SNF (31)   CODE STATUS: Full Code  Allergies  Allergen Reactions  . Oxycodone Other (See Comments)    "makes her crazy"    Chief Complaint  Patient presents with  . Acute Visit    Right Leg Edema    HPI:  Staff report that she is developing right lower extremity edema over this past weekend. She is complaining of worsening back right hip and right knee pain. She is not getting adequate relief. She is for her spinal injection in the AM. Her pain is interfering in her ability to participate in therapy.    Past Medical History:  Diagnosis Date  . Anemia, unspecified    B12 and iron deficiency   . Arthritis   . B12 deficiency   . Collagen vascular disease ([place])   . Crohn disease ([place])   . Crohn's disease ([place])    followed by Dr. Vira Agar with serial colonoscopies  . Dyspnea   . Fuchs' endothelial dystrophy   . GERD (gastroesophageal reflux disease)   . History of bone density study 09/01/2012  . History of spinal fracture   . Hyperlipidemia, unspecified   . Hypertension   . Hypokalemia   . Hypomagnesemia   . Osteoporosis   . Other dysphagia 01/24/2017  . Pneumonia    1980's  . Protein calorie malnutrition ([place])   . Retinal detachment   . Right shoulder injury 05/2002   treated conservatively  . Trigger finger   . Zenker diverticulum 09/25/2016    Past Surgical History:  Procedure Laterality Date  . APPENDECTOMY  1949  . CATARACT EXTRACTION EXTRACAPSULAR Right 05/10/2015   Procedure: EXTRACTION CATARACT EXTRACAPSULAR W/INSERTION INTRAOCULAR PROSTHESIS; Surgeon: Doyce Para, MD; [facility]; Service: Ophthalmology; Laterality: Right;  . CHOLECYSTECTOMY  1992  . COLONOSCOPY     03/24/1992, 03/11/1998, 07/08/2002, 05/08/2006, 01/31/2012   PH Crohns disease; no repeat due to age per RTE (dw)  . CORNEAL TRANSPLANT Left 07/08/2012   Procedure: CORNEAL TRANSPLANT DSAEK **  tissue ordered OSI 06/27/2012; Surgeon: Doyce Para, MD; Location: [place]; Service: Ophthalmology;  . ESOPHAGOGASTRODUODENOSCOPY     05/01/2005, 01/31/2012 ; No repeat per RTE  . ESOPHAGOGASTRODUODENOSCOPY (EGD) WITH PROPOFOL N/A 05/30/2018   Procedure: ESOPHAGOGASTRODUODENOSCOPY (EGD) WITH PROPOFOL;  Surgeon: Manya Silvas, MD;  Location: [facility] ENDOSCOPY;  Service: Endoscopy;  Laterality: N/A;  . EYE SURGERY  2012, 2013   5 total  . JOINT REPLACEMENT Right 01-28-13  . KYPHOPLASTY N/A 08/12/2018   Procedure: KYPHOPLASTY L3;  Surgeon: Hessie Knows, MD;  Location: [facility] ORS;  Service: Orthopedics;  Laterality: N/A;  . LENSECTOMY PHACOFRAGMENTATION WITH ASPIRATION Left 07/25/2011  . TONSILLECTOMY  1950  . TOTAL HIP ARTHROPLASTY Left 12/23/2017   Procedure: TOTAL HIP ARTHROPLASTY;  Surgeon: Dereck Leep, MD;  Location: [facility] ORS;  Service: Orthopedics;  Laterality: Left;  Marland Kitchen VITREOUS RETINAL SURGERY Left 11/07/2011   PPV/SO  . VITREOUS RETINAL SURGERY Left 04/18/2011   TPPV/TRP/SO/SB/EL/C3F8    Social History   Socioeconomic History  . Marital status: Widowed    Spouse name: Not on file  . Number of children: 2  . Years of education: college  . Highest education level: Not on file  Occupational History  . Occupation: retired    Comment: Pharmacist, hospital  Social Needs  . Financial resource strain: Not on file  . Food insecurity:    Worry: Not on file  Inability: Not on file  . Transportation needs:    Medical: Not on file    Non-medical: Not on file  Tobacco Use  . Smoking status: Former Smoker    Packs/day: 0.50    Years: 35.00    Pack years: 17.50    Types: Cigarettes    Last attempt to quit: 01/29/1991    Years since quitting: 27.6  . Smokeless tobacco: Never Used  Substance and Sexual Activity  . Alcohol use: Yes    Comment: 3x/yr  . Drug use: No  . Sexual activity: Never  Lifestyle  . Physical activity:    Days per week: Not on file    Minutes per session: Not on file    . Stress: Not on file  Relationships  . Social connections:    Talks on phone: Not on file    Gets together: Not on file    Attends religious service: Not on file    Active member of club or organization: Not on file    Attends meetings of clubs or organizations: Not on file    Relationship status: Not on file  . Intimate partner violence:    Fear of current or ex partner: Not on file    Emotionally abused: Not on file    Physically abused: Not on file    Forced sexual activity: Not on file  Other Topics Concern  . Not on file  Social History Narrative   Widowed   Former smoker   Occasional drink 3 x a year   2 children    Full Code   Family History  Problem Relation Age of Onset  . Hypertension Mother   . Stroke Father   . Hypertension Father   . Arthritis Other   . Prostate cancer Other   . Psoriasis Other   . Stroke Other   . Thyroid disease Other   . Anesthesia problems Neg Hx   . Diabetes Neg Hx   . Glaucoma Neg Hx   . Macular degeneration Neg Hx       VITAL SIGNS BP (!) 158/73   Pulse 74   Temp 97.7 F (36.5 C)   Resp 18   Ht 5' 2"  (1.575 m)   Wt 165 lb (74.8 kg)   SpO2 97%   BMI 30.18 kg/m   Outpatient Encounter Medications as of 09/01/2018  Medication Sig  . acetaminophen (TYLENOL) 500 MG tablet Take 500 mg by mouth every 8 (eight) hours as needed for mild pain or moderate pain.   Marland Kitchen alendronate (FOSAMAX) 70 MG tablet Take 1 tablet (70 mg total) by mouth once a week. Take with a full glass of water on an empty stomach.  . Amino Acids-Protein Hydrolys (FEEDING SUPPLEMENT, PRO-STAT SUGAR FREE 64,) LIQD Take 30 mLs by mouth 2 (two) times daily.  Marland Kitchen aspirin EC 325 MG tablet Take 325 mg by mouth daily.   . budesonide (ENTOCORT EC) 3 MG 24 hr capsule Take 9 mg by mouth daily.   . Calcium Citrate-Vitamin D (CITRACAL MAXIMUM) 315-250 MG-UNIT TABS Take 1 tablet by mouth 3 (three) times daily.  . cholecalciferol (VITAMIN D) 1000 units tablet Take 1,000 Units  by mouth daily.   . Coenzyme Q10 (COQ10) 200 MG CAPS Take 1 capsule by mouth daily.   Marland Kitchen gabapentin (NEURONTIN) 300 MG capsule Take 300 mg by mouth 3 (three) times daily.  Marland Kitchen HYDROmorphone (DILAUDID) 2 MG tablet Take 0.5 tablets (1 mg total) by mouth every 12 (twelve)  hours as needed for severe pain (For pain unrelieved by hydrocodone).  . iron polysaccharides (NIFEREX) 150 MG capsule Take 150 mg by mouth daily.  Marland Kitchen lidocaine (LIDODERM) 5 % Apply topically to right knee in the AM and Remove & Discard patch at night within 12 hours or as directed by MD for uncontrolled knee pain  . loperamide (LOPERAMIDE A-D) 2 MG tablet Take 2 mg by mouth 3 (three) times daily.   . magnesium oxide (MAG-OX) 400 (241.3 Mg) MG tablet Take 1 tablet (400 mg total) by mouth daily.  . Multiple Vitamin (MULTIVITAMIN) tablet Take 1 tablet by mouth daily.  Marland Kitchen NEXIUM 40 MG capsule Take 40 mg by mouth daily.   . NON FORMULARY Diet Type:  regular  . Nutritional Supplements (ENSURE ENLIVE PO) Take 1 Bottle by mouth 2 (two) times daily.  . potassium chloride SA (K-DUR,KLOR-CON) 20 MEQ tablet Take 20 mEq by mouth 2 (two) times daily.  . prednisoLONE acetate (PRED FORTE) 1 % ophthalmic suspension Place 1 drop into both eyes at bedtime.  . Probiotic Product (RISA-BID PROBIOTIC) TABS Take 1 tablet by mouth daily.  Marland Kitchen torsemide (DEMADEX) 5 MG tablet Take 5 mg by mouth daily as needed.   . traMADol (ULTRAM) 50 MG tablet Take 1 tablet (50 mg total) by mouth every 6 (six) hours as needed for moderate pain.  . vitamin B-12 (CYANOCOBALAMIN) 1000 MCG tablet Take 1,000 mcg by mouth daily.  . vitamin C (ASCORBIC ACID) 500 MG tablet Take 500 mg by mouth daily.   No facility-administered encounter medications on file as of 09/01/2018.      SIGNIFICANT DIAGNOSTIC EXAMS  PREVIOUS;   07-31-18: right lower extremity venous doppler: No evidence of deep venous thrombosis.  08-07-18: MRI lumbar spine:  1. Deformity of the L3 inferior endplate  with moderate marrow edema, suggesting subacute compression fracture. This does appear new compared to the CT from 05/14/2018, allowing for differences in technique. Correlate for any history of recent trauma. 2. Moderate spinal canal and severe bilateral neural foraminal stenosis at L3-4. 3. L4-5 left lateral recess narrowing that may be a source of left L5 radiculopathy. 4. L4-5 mild right and moderate left neural foraminal stenosis.   NO NEW LABS.   LABS REVIEWED: PREVIOUS:   08-07-18: wbc 12.7; hgb 13.0; hct 41.2; mcv 92.2; plt 445; glucose 235; bun 23; create 0.71; k+ 4.5; na++ 135; ca 8.5; liver normal albumin 2.7  08-11-18: glucose 325; bun 23; creat 0.71  k+ 4.5; na++ 135 ca 8.5 liver normal albumin 2.7  08-24-18: urine culture: <10,000 colonies   NO NEW LABS.    Review of Systems  Constitutional: Negative for malaise/fatigue.  Respiratory: Negative for cough and shortness of breath.   Cardiovascular: Negative for chest pain, palpitations and leg swelling.  Gastrointestinal: Negative for abdominal pain, constipation and heartburn.  Musculoskeletal: Positive for back pain, joint pain and myalgias.       Back pain is improving Right hip and knee pain is worse  Skin: Negative.   Neurological: Negative for dizziness.  Psychiatric/Behavioral: The patient is not nervous/anxious.     Physical Exam Constitutional:      General: She is not in acute distress.    Appearance: She is well-developed. She is not diaphoretic.  Neck:     Musculoskeletal: Neck supple.     Thyroid: No thyromegaly.  Cardiovascular:     Rate and Rhythm: Normal rate and regular rhythm.     Pulses: Normal pulses.  Heart sounds: Normal heart sounds.  Pulmonary:     Effort: Pulmonary effort is normal. No respiratory distress.     Breath sounds: Normal breath sounds.  Abdominal:     General: Bowel sounds are normal. There is no distension.     Palpations: Abdomen is soft.     Tenderness: There is no  abdominal tenderness.  Musculoskeletal:     Right lower leg: Edema present.     Left lower leg: Edema present.     Comments: Is able to move all extremities Right > left lower extremity edema with tenderness to palpation in right calf.   Lymphadenopathy:     Cervical: No cervical adenopathy.  Skin:    General: Skin is warm and dry.  Neurological:     Mental Status: She is alert and oriented to person, place, and time.  Psychiatric:        Mood and Affect: Mood normal.      ASSESSMENT/ PLAN:  TODAY:   1. Arthritis right knee:  2.  Vertebral compression fracture L3: is status post kyphoplasty:   Will get a venous doppler for her right lower extremity  Will change ultram to 50 mg every 6 hours routinely  Will check CMP on 09-05-17.    MD is aware of resident's narcotic use and is in agreement with current plan of care. We will attempt to wean resident as apropriate   Ok Edwards NP [facility]  Contact [phone] Monday through Friday 8am- 5pm  After hours call [phone]

## 2018-09-03 DIAGNOSIS — M1711 Unilateral primary osteoarthritis, right knee: Secondary | ICD-10-CM | POA: Insufficient documentation

## 2018-09-04 ENCOUNTER — Encounter: Payer: Self-pay | Admitting: Adult Health

## 2018-09-04 ENCOUNTER — Non-Acute Institutional Stay (SKILLED_NURSING_FACILITY): Payer: Medicare Other | Admitting: Adult Health

## 2018-09-04 DIAGNOSIS — E43 Unspecified severe protein-calorie malnutrition: Secondary | ICD-10-CM

## 2018-09-04 DIAGNOSIS — E876 Hypokalemia: Secondary | ICD-10-CM

## 2018-09-04 DIAGNOSIS — I10 Essential (primary) hypertension: Secondary | ICD-10-CM

## 2018-09-04 DIAGNOSIS — M1711 Unilateral primary osteoarthritis, right knee: Secondary | ICD-10-CM | POA: Diagnosis not present

## 2018-09-04 NOTE — Progress Notes (Signed)
Location:   [facility] Room Number: 206 A Place of Service:  SNF (31)   CODE STATUS: Full Code  Allergies  Allergen Reactions  . Oxycodone Other (See Comments)    "makes her crazy"    Chief Complaint  Patient presents with  . Medical Management of Chronic Issues    Benign essential HTN; arthritis of right knee; hypokalemia; severe protein calorie malnutrition. Weekly follow up for the first 30 days post hospitalization.     HPI:  She is a 85 year old short term rehab patient being seen for the management of her chronic illnesses: hypertension; arthritis; protein calorie malnutrition. She had been hospitalized for a vertebral  compression fracture. She did have her injection done yesterday. She does not feel as though she is getting any relief from the injection. She denies any problems with constipation; no insomnia.   Past Medical History:  Diagnosis Date  . Anemia, unspecified    B12 and iron deficiency   . Arthritis   . B12 deficiency   . Collagen vascular disease ([place])   . Crohn disease ([place])   . Crohn's disease ([place])    followed by Dr. Vira Agar with serial colonoscopies  . Dyspnea   . Fuchs' endothelial dystrophy   . GERD (gastroesophageal reflux disease)   . History of bone density study 09/01/2012  . History of spinal fracture   . Hyperlipidemia, unspecified   . Hypertension   . Hypokalemia   . Hypomagnesemia   . Osteoporosis   . Other dysphagia 01/24/2017  . Pneumonia    1980's  . Protein calorie malnutrition ([place])   . Retinal detachment   . Right shoulder injury 05/2002   treated conservatively  . Trigger finger   . Zenker diverticulum 09/25/2016    Past Surgical History:  Procedure Laterality Date  . APPENDECTOMY  1949  . CATARACT EXTRACTION EXTRACAPSULAR Right 05/10/2015   Procedure: EXTRACTION CATARACT EXTRACAPSULAR W/INSERTION INTRAOCULAR PROSTHESIS; Surgeon: Doyce Para, MD; [facility]; Service:  Ophthalmology; Laterality: Right;  . CHOLECYSTECTOMY  1992  . COLONOSCOPY     03/24/1992, 03/11/1998, 07/08/2002, 05/08/2006, 01/31/2012   PH Crohns disease; no repeat due to age per RTE (dw)  . CORNEAL TRANSPLANT Left 07/08/2012   Procedure: CORNEAL TRANSPLANT DSAEK ** tissue ordered OSI 06/27/2012; Surgeon: Doyce Para, MD; Location: [place]; Service: Ophthalmology;  . ESOPHAGOGASTRODUODENOSCOPY     05/01/2005, 01/31/2012 ; No repeat per RTE  . ESOPHAGOGASTRODUODENOSCOPY (EGD) WITH PROPOFOL N/A 05/30/2018   Procedure: ESOPHAGOGASTRODUODENOSCOPY (EGD) WITH PROPOFOL;  Surgeon: Manya Silvas, MD;  Location: [facility] ENDOSCOPY;  Service: Endoscopy;  Laterality: N/A;  . EYE SURGERY  2012, 2013   5 total  . JOINT REPLACEMENT Right 01-28-13  . KYPHOPLASTY N/A 08/12/2018   Procedure: KYPHOPLASTY L3;  Surgeon: Hessie Knows, MD;  Location: [facility] ORS;  Service: Orthopedics;  Laterality: N/A;  . LENSECTOMY PHACOFRAGMENTATION WITH ASPIRATION Left 07/25/2011  . TONSILLECTOMY  1950  . TOTAL HIP ARTHROPLASTY Left 12/23/2017   Procedure: TOTAL HIP ARTHROPLASTY;  Surgeon: Dereck Leep, MD;  Location: [facility] ORS;  Service: Orthopedics;  Laterality: Left;  Marland Kitchen VITREOUS RETINAL SURGERY Left 11/07/2011   PPV/SO  . VITREOUS RETINAL SURGERY Left 04/18/2011   TPPV/TRP/SO/SB/EL/C3F8    Social History   Socioeconomic History  . Marital status: Widowed    Spouse name: Not on file  . Number of children: 2  . Years of education: college  . Highest education level: Not on file  Occupational  History  . Occupation: retired    Comment: Pharmacist, hospital  Social Needs  . Financial resource strain: Not on file  . Food insecurity:    Worry: Not on file    Inability: Not on file  . Transportation needs:    Medical: Not on file    Non-medical: Not on file  Tobacco Use  . Smoking status: Former Smoker    Packs/day: 0.50    Years: 35.00    Pack years: 17.50    Types: Cigarettes    Last attempt to quit: 01/29/1991    Years  since quitting: 27.6  . Smokeless tobacco: Never Used  Substance and Sexual Activity  . Alcohol use: Yes    Comment: 3x/yr  . Drug use: No  . Sexual activity: Never  Lifestyle  . Physical activity:    Days per week: Not on file    Minutes per session: Not on file  . Stress: Not on file  Relationships  . Social connections:    Talks on phone: Not on file    Gets together: Not on file    Attends religious service: Not on file    Active member of club or organization: Not on file    Attends meetings of clubs or organizations: Not on file    Relationship status: Not on file  . Intimate partner violence:    Fear of current or ex partner: Not on file    Emotionally abused: Not on file    Physically abused: Not on file    Forced sexual activity: Not on file  Other Topics Concern  . Not on file  Social History Narrative   Widowed   Former smoker   Occasional drink 3 x a year   2 children    Full Code   Family History  Problem Relation Age of Onset  . Hypertension Mother   . Stroke Father   . Hypertension Father   . Arthritis Other   . Prostate cancer Other   . Psoriasis Other   . Stroke Other   . Thyroid disease Other   . Anesthesia problems Neg Hx   . Diabetes Neg Hx   . Glaucoma Neg Hx   . Macular degeneration Neg Hx       VITAL SIGNS BP (!) 160/65   Pulse 94   Temp (!) 97.5 F (36.4 C)   Resp 18   Ht 5' 2"  (1.575 m)   Wt 165 lb (74.8 kg)   SpO2 97%   BMI 30.18 kg/m   Outpatient Encounter Medications as of 09/04/2018  Medication Sig  . acetaminophen (TYLENOL) 500 MG tablet Take 500 mg by mouth every 8 (eight) hours as needed for mild pain or moderate pain.   Marland Kitchen alendronate (FOSAMAX) 70 MG tablet Take 1 tablet (70 mg total) by mouth once a week. Take with a full glass of water on an empty stomach.  . Amino Acids-Protein Hydrolys (FEEDING SUPPLEMENT, PRO-STAT SUGAR FREE 64,) LIQD Take 30 mLs by mouth 2 (two) times daily.  Marland Kitchen aspirin EC 325 MG tablet Take 325  mg by mouth daily.   . budesonide (ENTOCORT EC) 3 MG 24 hr capsule Take 9 mg by mouth daily.   . Calcium Citrate-Vitamin D (CITRACAL MAXIMUM) 315-250 MG-UNIT TABS Take 1 tablet by mouth 3 (three) times daily.  . cholecalciferol (VITAMIN D) 1000 units tablet Take 1,000 Units by mouth daily.   . Coenzyme Q10 (COQ10) 200 MG CAPS Take 1 capsule by mouth daily.   Marland Kitchen  gabapentin (NEURONTIN) 300 MG capsule Take 300 mg by mouth 3 (three) times daily.  Marland Kitchen HYDROmorphone (DILAUDID) 2 MG tablet Take 0.5 tablets (1 mg total) by mouth every 12 (twelve) hours as needed for severe pain (For pain unrelieved by hydrocodone).  . iron polysaccharides (NIFEREX) 150 MG capsule Take 150 mg by mouth daily.  Marland Kitchen lidocaine (LIDODERM) 5 % Apply topically to right knee in the AM and Remove & Discard patch at night within 12 hours or as directed by MD for uncontrolled knee pain  . loperamide (LOPERAMIDE A-D) 2 MG tablet Take 2 mg by mouth 3 (three) times daily.   . magnesium oxide (MAG-OX) 400 (241.3 Mg) MG tablet Take 1 tablet (400 mg total) by mouth daily.  . Multiple Vitamin (MULTIVITAMIN) tablet Take 1 tablet by mouth daily.  Marland Kitchen NEXIUM 40 MG capsule Take 40 mg by mouth daily.   . NON FORMULARY Diet Type:  regular  . Nutritional Supplements (ENSURE ENLIVE PO) Take 1 Bottle by mouth 2 (two) times daily.  . potassium chloride SA (K-DUR,KLOR-CON) 20 MEQ tablet Take 20 mEq by mouth 2 (two) times daily.  . prednisoLONE acetate (PRED FORTE) 1 % ophthalmic suspension Place 1 drop into both eyes at bedtime.  . Probiotic Product (RISA-BID PROBIOTIC) TABS Take 1 tablet by mouth daily.  Marland Kitchen torsemide (DEMADEX) 5 MG tablet Take 5 mg by mouth daily as needed.   . traMADol (ULTRAM) 50 MG tablet Take 50 mg by mouth every 6 (six) hours.  . vitamin B-12 (CYANOCOBALAMIN) 1000 MCG tablet Take 1,000 mcg by mouth daily.  . vitamin C (ASCORBIC ACID) 500 MG tablet Take 500 mg by mouth daily.  . [DISCONTINUED] traMADol (ULTRAM) 50 MG tablet Take 1  tablet (50 mg total) by mouth every 6 (six) hours as needed for moderate pain. (Patient not taking: Reported on 09/04/2018)   No facility-administered encounter medications on file as of 09/04/2018.      SIGNIFICANT DIAGNOSTIC EXAMS  PREVIOUS;   07-31-18: right lower extremity venous doppler: No evidence of deep venous thrombosis.  08-07-18: MRI lumbar spine:  1. Deformity of the L3 inferior endplate with moderate marrow edema, suggesting subacute compression fracture. This does appear new compared to the CT from 05/14/2018, allowing for differences in technique. Correlate for any history of recent trauma. 2. Moderate spinal canal and severe bilateral neural foraminal stenosis at L3-4. 3. L4-5 left lateral recess narrowing that may be a source of left L5 radiculopathy. 4. L4-5 mild right and moderate left neural foraminal stenosis.   NO NEW LABS.   LABS REVIEWED: PREVIOUS:   08-07-18: wbc 12.7; hgb 13.0; hct 41.2; mcv 92.2; plt 445; glucose 235; bun 23; create 0.71; k+ 4.5; na++ 135; ca 8.5; liver normal albumin 2.7  08-11-18: glucose 325; bun 23; creat 0.71  k+ 4.5; na++ 135 ca 8.5 liver normal albumin 2.7  08-24-18: urine culture: <10,000 colonies   NO NEW LABS.   Review of Systems  Constitutional: Negative for malaise/fatigue.  Respiratory: Negative for cough and shortness of breath.   Cardiovascular: Negative for chest pain, palpitations and leg swelling.  Gastrointestinal: Negative for abdominal pain, constipation and heartburn.  Musculoskeletal: Positive for back pain, joint pain and myalgias.       Her chronic pain is without change   Skin: Negative.   Neurological: Negative for dizziness.  Psychiatric/Behavioral: The patient is not nervous/anxious.     Physical Exam Constitutional:      General: She is not in acute distress.  Appearance: She is well-developed. She is not diaphoretic.  Neck:     Musculoskeletal: Neck supple.     Thyroid: No thyromegaly.    Cardiovascular:     Rate and Rhythm: Normal rate and regular rhythm.     Pulses: Normal pulses.     Heart sounds: Normal heart sounds.  Pulmonary:     Effort: Pulmonary effort is normal. No respiratory distress.     Breath sounds: Normal breath sounds.  Abdominal:     General: Bowel sounds are normal. There is no distension.     Palpations: Abdomen is soft.     Tenderness: There is no abdominal tenderness.  Musculoskeletal:     Right lower leg: Edema present.     Left lower leg: Edema present.     Comments: Is able to move all extremities Right > left lower extremity edema   Lymphadenopathy:     Cervical: No cervical adenopathy.  Skin:    General: Skin is warm and dry.  Neurological:     Mental Status: She is alert and oriented to person, place, and time.  Psychiatric:        Mood and Affect: Mood normal.     ASSESSMENT/ PLAN:  TODAY:   1. Hypokalemia: is stable k+ 4.5; will continue k+ 20 meq twice daily   2. Essential benign hypertension: is stable 149/75: will continue asa 325 mg daily   3. Arthritis right knee: pain is managed: will continue lidoderm patch 5 % daily is using ice pack as needed   4. Protein calorie malnutrition severe: is stable albumin 2.7 will continue supplements    PREVIOUS  5. Spinal stenosis of lumbar region with radiculopathy: is stable; will continue neurontin  300 mg three times daily   6. Vertebral compression fracture L3: is status post kyphoplasty: will continue therapy as directed will continue ultram 50 mg every 6 hours as needed and  Dilaudid 1 mg twelve hours as needed   7. Crohn's disease: is stable will continue budesonide 9 mg daily and imodium 2 mg three times daily   8. Age related osteoporosis with current pathological fracture with routine healing: is stable will continue fosamax weekly is taking calcium and vit d supplements  9. Chronic anemia: hgb 13.0; will continue niferex 150 mg daily   10. GERD without esophagitis:  is stable will continue nexium 40 mg daily    MD is aware of resident's narcotic use and is in agreement with current plan of care. We will attempt to wean resident as apropriate   Ok Edwards NP [facility]  Contact [phone] Monday through Friday 8am- 5pm  After hours call [phone]

## 2018-09-08 ENCOUNTER — Other Ambulatory Visit: Payer: Self-pay | Admitting: Adult Health

## 2018-09-08 ENCOUNTER — Encounter: Payer: Self-pay | Admitting: Adult Health

## 2018-09-08 ENCOUNTER — Non-Acute Institutional Stay (SKILLED_NURSING_FACILITY): Payer: Medicare Other | Admitting: Adult Health

## 2018-09-08 DIAGNOSIS — E43 Unspecified severe protein-calorie malnutrition: Secondary | ICD-10-CM

## 2018-09-08 DIAGNOSIS — M48061 Spinal stenosis, lumbar region without neurogenic claudication: Secondary | ICD-10-CM

## 2018-09-08 DIAGNOSIS — S32030S Wedge compression fracture of third lumbar vertebra, sequela: Secondary | ICD-10-CM

## 2018-09-08 DIAGNOSIS — M5416 Radiculopathy, lumbar region: Secondary | ICD-10-CM

## 2018-09-08 MED ORDER — TRAMADOL HCL 50 MG PO TABS: 50.0000 mg | ORAL_TABLET | Freq: Four times a day (QID) | ORAL | 0 refills | Status: DC

## 2018-09-08 MED ORDER — HYDROMORPHONE HCL 2 MG PO TABS: 1.0000 mg | ORAL_TABLET | Freq: Two times a day (BID) | ORAL | 0 refills | Status: AC | PRN

## 2018-09-08 NOTE — Progress Notes (Signed)
Location:   [facility] Room Number: 206 A Place of Service:  SNF (31)    CODE STATUS: Full Code  Allergies  Allergen Reactions  . Oxycodone Other (See Comments)    "makes her crazy"    Chief Complaint  Patient presents with  . Discharge Note    Discharging to ALF on 09/09/2018    HPI:  She is being discharged to assisted living with home health for pt/ot. She will need a light weight wheelchair and a semi-electric hospital bed. She will need her narcotic prescriptions sent. She will follow up with the medical provider at the receiving facility. She had been hospitalized for a compression fracture; was admitted to this facility for short term rehab.    Past Medical History:  Diagnosis Date  . Anemia, unspecified    B12 and iron deficiency   . Arthritis   . B12 deficiency   . Collagen vascular disease ([place])   . Crohn disease ([place])   . Crohn's disease ([place])    followed by Dr. Vira Agar with serial colonoscopies  . Dyspnea   . Fuchs' endothelial dystrophy   . GERD (gastroesophageal reflux disease)   . History of bone density study 09/01/2012  . History of spinal fracture   . Hyperlipidemia, unspecified   . Hypertension   . Hypokalemia   . Hypomagnesemia   . Osteoporosis   . Other dysphagia 01/24/2017  . Pneumonia    1980's  . Protein calorie malnutrition ([place])   . Retinal detachment   . Right shoulder injury 05/2002   treated conservatively  . Trigger finger   . Zenker diverticulum 09/25/2016    Past Surgical History:  Procedure Laterality Date  . APPENDECTOMY  1949  . CATARACT EXTRACTION EXTRACAPSULAR Right 05/10/2015   Procedure: EXTRACTION CATARACT EXTRACAPSULAR W/INSERTION INTRAOCULAR PROSTHESIS; Surgeon: Doyce Para, MD; [facility]; Service: Ophthalmology; Laterality: Right;  . CHOLECYSTECTOMY  1992  . COLONOSCOPY     03/24/1992, 03/11/1998, 07/08/2002, 05/08/2006, 01/31/2012   PH Crohns disease; no repeat due to age per  RTE (dw)  . CORNEAL TRANSPLANT Left 07/08/2012   Procedure: CORNEAL TRANSPLANT DSAEK ** tissue ordered OSI 06/27/2012; Surgeon: Doyce Para, MD; Location: [place]; Service: Ophthalmology;  . ESOPHAGOGASTRODUODENOSCOPY     05/01/2005, 01/31/2012 ; No repeat per RTE  . ESOPHAGOGASTRODUODENOSCOPY (EGD) WITH PROPOFOL N/A 05/30/2018   Procedure: ESOPHAGOGASTRODUODENOSCOPY (EGD) WITH PROPOFOL;  Surgeon: Manya Silvas, MD;  Location: [facility] ENDOSCOPY;  Service: Endoscopy;  Laterality: N/A;  . EYE SURGERY  2012, 2013   5 total  . JOINT REPLACEMENT Right 01-28-13  . KYPHOPLASTY N/A 08/12/2018   Procedure: KYPHOPLASTY L3;  Surgeon: Hessie Knows, MD;  Location: [facility] ORS;  Service: Orthopedics;  Laterality: N/A;  . LENSECTOMY PHACOFRAGMENTATION WITH ASPIRATION Left 07/25/2011  . TONSILLECTOMY  1950  . TOTAL HIP ARTHROPLASTY Left 12/23/2017   Procedure: TOTAL HIP ARTHROPLASTY;  Surgeon: Dereck Leep, MD;  Location: [facility] ORS;  Service: Orthopedics;  Laterality: Left;  Marland Kitchen VITREOUS RETINAL SURGERY Left 11/07/2011   PPV/SO  . VITREOUS RETINAL SURGERY Left 04/18/2011   TPPV/TRP/SO/SB/EL/C3F8    Social History   Socioeconomic History  . Marital status: Widowed    Spouse name: Not on file  . Number of children: 2  . Years of education: college  . Highest education level: Not on file  Occupational History  . Occupation: retired    Comment: Pharmacist, hospital  Social Needs  . Financial resource strain: Not on file  .  Food insecurity:    Worry: Not on file    Inability: Not on file  . Transportation needs:    Medical: Not on file    Non-medical: Not on file  Tobacco Use  . Smoking status: Former Smoker    Packs/day: 0.50    Years: 35.00    Pack years: 17.50    Types: Cigarettes    Last attempt to quit: 01/29/1991    Years since quitting: 27.6  . Smokeless tobacco: Never Used  Substance and Sexual Activity  . Alcohol use: Yes    Comment: 3x/yr  . Drug use: No  . Sexual activity: Never  Lifestyle    . Physical activity:    Days per week: Not on file    Minutes per session: Not on file  . Stress: Not on file  Relationships  . Social connections:    Talks on phone: Not on file    Gets together: Not on file    Attends religious service: Not on file    Active member of club or organization: Not on file    Attends meetings of clubs or organizations: Not on file    Relationship status: Not on file  . Intimate partner violence:    Fear of current or ex partner: Not on file    Emotionally abused: Not on file    Physically abused: Not on file    Forced sexual activity: Not on file  Other Topics Concern  . Not on file  Social History Narrative   Widowed   Former smoker   Occasional drink 3 x a year   2 children    Full Code   Family History  Problem Relation Age of Onset  . Hypertension Mother   . Stroke Father   . Hypertension Father   . Arthritis Other   . Prostate cancer Other   . Psoriasis Other   . Stroke Other   . Thyroid disease Other   . Anesthesia problems Neg Hx   . Diabetes Neg Hx   . Glaucoma Neg Hx   . Macular degeneration Neg Hx     VITAL SIGNS Pulse 79   Temp 97.8 F (36.6 C)   Resp 20   Ht 5' 2"  (1.575 m)   Wt 165 lb (74.8 kg)   SpO2 96%   BMI 30.18 kg/m   Patient's Medications  New Prescriptions   No medications on file  Previous Medications   ACETAMINOPHEN (TYLENOL) 500 MG TABLET    Take 500 mg by mouth every 8 (eight) hours as needed for mild pain or moderate pain.    ALENDRONATE (FOSAMAX) 70 MG TABLET    Take 1 tablet (70 mg total) by mouth once a week. Take with a full glass of water on an empty stomach.   AMINO ACIDS-PROTEIN HYDROLYS (FEEDING SUPPLEMENT, PRO-STAT SUGAR FREE 64,) LIQD    Take 30 mLs by mouth 2 (two) times daily.   ASPIRIN EC 325 MG TABLET    Take 325 mg by mouth daily.    BUDESONIDE (ENTOCORT EC) 3 MG 24 HR CAPSULE    Take 9 mg by mouth daily.    CALCIUM CITRATE-VITAMIN D (CITRACAL MAXIMUM) 315-250 MG-UNIT TABS    Take 1  tablet by mouth 3 (three) times daily.   CHOLECALCIFEROL (VITAMIN D) 1000 UNITS TABLET    Take 1,000 Units by mouth daily.    COENZYME Q10 (COQ10) 200 MG CAPS    Take 1 capsule by mouth daily.  GABAPENTIN (NEURONTIN) 300 MG CAPSULE    Take 300 mg by mouth 3 (three) times daily.   HYDROMORPHONE (DILAUDID) 2 MG TABLET    Take 0.5 tablets (1 mg total) by mouth every 12 (twelve) hours as needed for severe pain (For pain unrelieved by hydrocodone).   IRON POLYSACCHARIDES (NIFEREX) 150 MG CAPSULE    Take 150 mg by mouth daily.   LIDOCAINE (LIDODERM) 5 %    Apply topically to right knee in the AM and Remove & Discard patch at night within 12 hours or as directed by MD for uncontrolled knee pain   LOPERAMIDE (LOPERAMIDE A-D) 2 MG TABLET    Take 2 mg by mouth 3 (three) times daily.    MAGNESIUM OXIDE (MAG-OX) 400 (241.3 MG) MG TABLET    Take 1 tablet (400 mg total) by mouth daily.   MULTIPLE VITAMIN (MULTIVITAMIN) TABLET    Take 1 tablet by mouth daily.   NEXIUM 40 MG CAPSULE    Take 40 mg by mouth daily.    NON FORMULARY    Diet Type:  regular   NUTRITIONAL SUPPLEMENTS (ENSURE ENLIVE PO)    Take 1 Bottle by mouth 2 (two) times daily.   POTASSIUM CHLORIDE SA (K-DUR,KLOR-CON) 20 MEQ TABLET    Take 20 mEq by mouth 2 (two) times daily.   PREDNISOLONE ACETATE (PRED FORTE) 1 % OPHTHALMIC SUSPENSION    Place 1 drop into both eyes at bedtime.   PROBIOTIC PRODUCT (RISA-BID PROBIOTIC) TABS    Take 1 tablet by mouth daily.   TORSEMIDE (DEMADEX) 5 MG TABLET    Take 5 mg by mouth daily as needed.    TRAMADOL (ULTRAM) 50 MG TABLET    Take 1 tablet (50 mg total) by mouth every 6 (six) hours.   VITAMIN B-12 (CYANOCOBALAMIN) 1000 MCG TABLET    Take 1,000 mcg by mouth daily.   VITAMIN C (ASCORBIC ACID) 500 MG TABLET    Take 500 mg by mouth daily.  Modified Medications   No medications on file  Discontinued Medications   No medications on file     SIGNIFICANT DIAGNOSTIC EXAMS   PREVIOUS;   07-31-18: right  lower extremity venous doppler: No evidence of deep venous thrombosis.  08-07-18: MRI lumbar spine:  1. Deformity of the L3 inferior endplate with moderate marrow edema, suggesting subacute compression fracture. This does appear new compared to the CT from 05/14/2018, allowing for differences in technique. Correlate for any history of recent trauma. 2. Moderate spinal canal and severe bilateral neural foraminal stenosis at L3-4. 3. L4-5 left lateral recess narrowing that may be a source of left L5 radiculopathy. 4. L4-5 mild right and moderate left neural foraminal stenosis.   NO NEW LABS.   LABS REVIEWED: PREVIOUS:   08-07-18: wbc 12.7; hgb 13.0; hct 41.2; mcv 92.2; plt 445; glucose 235; bun 23; create 0.71; k+ 4.5; na++ 135; ca 8.5; liver normal albumin 2.7  08-11-18: glucose 325; bun 23; creat 0.71  k+ 4.5; na++ 135 ca 8.5 liver normal albumin 2.7  08-24-18: urine culture: <10,000 colonies   NO NEW LABS.   Review of Systems  Constitutional: Negative for malaise/fatigue.  Respiratory: Negative for cough and shortness of breath.   Cardiovascular: Negative for chest pain, palpitations and leg swelling.  Gastrointestinal: Negative for abdominal pain, constipation and heartburn.  Musculoskeletal: Positive for back pain, joint pain and myalgias.       Has chronic back knee and muscle pain  Skin: Negative.   Neurological:  Negative for dizziness.  Psychiatric/Behavioral: The patient is not nervous/anxious.      Physical Exam Constitutional:      General: She is not in acute distress.    Appearance: She is well-developed. She is not diaphoretic.  Neck:     Musculoskeletal: Neck supple.     Thyroid: No thyromegaly.  Cardiovascular:     Rate and Rhythm: Normal rate and regular rhythm.     Pulses: Normal pulses.     Heart sounds: Normal heart sounds.  Pulmonary:     Effort: Pulmonary effort is normal. No respiratory distress.     Breath sounds: Normal breath sounds.  Abdominal:      General: Bowel sounds are normal. There is no distension.     Palpations: Abdomen is soft.     Tenderness: There is no abdominal tenderness.  Musculoskeletal:     Right lower leg: Edema present.     Left lower leg: Edema present.     Comments: Is able to move all extremities Right > left lower extremity edema    Lymphadenopathy:     Cervical: No cervical adenopathy.  Skin:    General: Skin is warm and dry.  Neurological:     Mental Status: She is alert and oriented to person, place, and time.  Psychiatric:        Mood and Affect: Mood normal.       ASSESSMENT/ PLAN:   Patient is being discharged with the following home health services:  Pt/ot to evaluate and treat as indicated for gait balance strength adl training.   Patient is being discharged with the following durable medical equipment:  Light weight wheelchair with elevated leg rests cushion; anti-tippers brake extensions to allow her to maintain her current level of independence with her adls which cannot be achieved with a walker; can self propel; requires light weight chair due to arm weakness.  Requires a semi-electric hospital bed for pain management due to her compression fracture for positioning in bed which cannot be achieved with a standard bed.    Patient has been advised to f/u with their PCP in 1-2 weeks to bring them up to date on their rehab stay.  Social services at facility was responsible for arranging this appointment.  Pt was provided with a 30 day supply of prescriptions for medications and refills must be obtained from their PCP.  For controlled substances, a more limited supply may be provided adequate until PCP appointment only.  # 5 dilaudid tablet and #30 ultram 50 mg tabs sent to [place].   Time spent with patient and discharge process: 45 minutes: discussed dme; medications; home health needs; verbalized understanding.    Ok Edwards NP [facility]  Contact  [phone] Monday through Friday 8am- 5pm  After hours call [phone]

## 2019-03-05 ENCOUNTER — Other Ambulatory Visit: Payer: Self-pay | Admitting: Internal Medicine

## 2019-03-05 ENCOUNTER — Other Ambulatory Visit: Payer: Self-pay

## 2019-03-05 ENCOUNTER — Ambulatory Visit
Admission: RE | Admit: 2019-03-05 | Discharge: 2019-03-05 | Disposition: A | Discharge status: Home / Self Care | Payer: Medicare Other | Source: Ambulatory Visit | Attending: Internal Medicine | Admitting: Internal Medicine

## 2019-03-05 DIAGNOSIS — R600 Localized edema: Secondary | ICD-10-CM | POA: Diagnosis not present

## 2019-04-07 DIAGNOSIS — R609 Edema, unspecified: Secondary | ICD-10-CM | POA: Insufficient documentation

## 2019-04-21 ENCOUNTER — Ambulatory Visit (INDEPENDENT_AMBULATORY_CARE_PROVIDER_SITE_OTHER): Payer: Medicare Other | Admitting: Vascular Surgery

## 2019-04-21 ENCOUNTER — Encounter (INDEPENDENT_AMBULATORY_CARE_PROVIDER_SITE_OTHER): Payer: Self-pay | Admitting: Vascular Surgery

## 2019-04-21 ENCOUNTER — Encounter (INDEPENDENT_AMBULATORY_CARE_PROVIDER_SITE_OTHER): Payer: Self-pay

## 2019-04-21 ENCOUNTER — Other Ambulatory Visit: Payer: Self-pay

## 2019-04-21 VITALS — BP 154/69 | HR 85 | Resp 16 | Ht 61.0 in | Wt 166.0 lb

## 2019-04-21 DIAGNOSIS — I10 Essential (primary) hypertension: Secondary | ICD-10-CM | POA: Diagnosis not present

## 2019-04-21 DIAGNOSIS — M48061 Spinal stenosis, lumbar region without neurogenic claudication: Secondary | ICD-10-CM | POA: Diagnosis not present

## 2019-04-21 DIAGNOSIS — M5416 Radiculopathy, lumbar region: Secondary | ICD-10-CM

## 2019-04-21 DIAGNOSIS — M7989 Other specified soft tissue disorders: Secondary | ICD-10-CM | POA: Diagnosis not present

## 2019-04-21 DIAGNOSIS — I890 Lymphedema, not elsewhere classified: Secondary | ICD-10-CM

## 2019-04-21 DIAGNOSIS — E785 Hyperlipidemia, unspecified: Secondary | ICD-10-CM

## 2019-04-21 NOTE — Progress Notes (Signed)
Patient ID: Joanne [name], female   DOB: August 08, 1931, 85 y.o.   MRN: [id number]  Chief Complaint  Patient presents with  . New Patient (Initial Visit)    ref Jerelene Redden for venous insufficiency    HPI Joanne [name] is a 85 y.o. female.  I am asked to see the patient by K. Jerelene Redden for evaluation of leg pain and swelling.  She has had several weeks of worsening lower extremity edema.  The right leg is a little worse than the left.  She reports some weeping intermittently in the right leg.  Her mobility has declined over the past couple of years.  Her legs are dependent much of the time.  She bought some compression socks but could not get them on.  There was no clear inciting event or causative factor that started the symptoms.  No fever or chills.  The legs are heavy and feel very full.     Past Medical History:  Diagnosis Date  . Anemia, unspecified    B12 and iron deficiency   . Arthritis   . B12 deficiency   . Collagen vascular disease ([place])   . Crohn disease ([place])   . Crohn's disease ([place])    followed by Dr. Vira Agar with serial colonoscopies  . Dyspnea   . Fuchs' endothelial dystrophy   . GERD (gastroesophageal reflux disease)   . History of bone density study 09/01/2012  . History of spinal fracture   . Hyperlipidemia, unspecified   . Hypertension   . Hypokalemia   . Hypomagnesemia   . Osteoporosis   . Other dysphagia 01/24/2017  . Pneumonia    1980's  . Protein calorie malnutrition ([place])   . Retinal detachment   . Right shoulder injury 05/2002   treated conservatively  . Trigger finger   . Zenker diverticulum 09/25/2016    Past Surgical History:  Procedure Laterality Date  . APPENDECTOMY  1949  . CATARACT EXTRACTION EXTRACAPSULAR Right 05/10/2015   Procedure: EXTRACTION CATARACT EXTRACAPSULAR W/INSERTION INTRAOCULAR PROSTHESIS; Surgeon: Doyce Para, MD; [facility]; Service: Ophthalmology; Laterality: Right;  . CHOLECYSTECTOMY  1992  . COLONOSCOPY     03/24/1992, 03/11/1998, 07/08/2002, 05/08/2006, 01/31/2012   PH Crohns disease; no repeat due to age per RTE (dw)  . CORNEAL TRANSPLANT Left 07/08/2012   Procedure: CORNEAL TRANSPLANT DSAEK ** tissue ordered OSI 06/27/2012; Surgeon: Doyce Para, MD; Location: [place]; Service: Ophthalmology;  . ESOPHAGOGASTRODUODENOSCOPY     05/01/2005, 01/31/2012 ; No repeat per RTE  . ESOPHAGOGASTRODUODENOSCOPY (EGD) WITH PROPOFOL N/A 05/30/2018   Procedure: ESOPHAGOGASTRODUODENOSCOPY (EGD) WITH PROPOFOL;  Surgeon: Manya Silvas, MD;  [facility] ENDOSCOPY;  Service: Endoscopy;  Laterality: N/A;  . EYE SURGERY  2012, 2013   5 total  . JOINT REPLACEMENT Right 01-28-13  . KYPHOPLASTY N/A 08/12/2018   Procedure: KYPHOPLASTY L3;  Surgeon: Hessie Knows, MD;  Location: [facility] ORS;  Service: Orthopedics;  Laterality: N/A;  . LENSECTOMY PHACOFRAGMENTATION WITH ASPIRATION Left 07/25/2011  . TONSILLECTOMY  1950  . TOTAL HIP ARTHROPLASTY Left 12/23/2017   Procedure: TOTAL HIP ARTHROPLASTY;  Surgeon: Dereck Leep, MD;  Location: [facility] ORS;  Service: Orthopedics;  Laterality: Left;  Marland Kitchen VITREOUS RETINAL SURGERY Left 11/07/2011   PPV/SO  . VITREOUS RETINAL SURGERY Left 04/18/2011   TPPV/TRP/SO/SB/EL/C3F8    Family History Family History  Problem Relation Age of Onset  . Hypertension Mother   . Stroke Father   . Hypertension Father   . Arthritis Other   .  Prostate cancer Other   . Psoriasis Other   . Stroke Other   . Thyroid disease Other   . Anesthesia problems Neg Hx   . Diabetes Neg Hx   . Glaucoma Neg Hx   . Macular degeneration Neg Hx     Social History Social History   Tobacco Use  . Smoking status: Former Smoker    Packs/day: 0.50    Years: 35.00    Pack years: 17.50    Types: Cigarettes    Quit date: 01/29/1991    Years since quitting: 28.2  . Smokeless tobacco: Never Used  Substance Use Topics  . Alcohol use: Yes    Comment: 3x/yr  . Drug use: No    Allergies  Allergen Reactions  .  Oxycodone Other (See Comments)    "makes her crazy"    Current Outpatient Medications  Medication Sig Dispense Refill  . acetaminophen (TYLENOL) 500 MG tablet Take 500 mg by mouth every 8 (eight) hours as needed for mild pain or moderate pain.     Marland Kitchen alendronate (FOSAMAX) 70 MG tablet Take 1 tablet (70 mg total) by mouth once a week. Take with a full glass of water on an empty stomach. 4 tablet 0  . Amino Acids-Protein Hydrolys (FEEDING SUPPLEMENT, PRO-STAT SUGAR FREE 64,) LIQD Take 30 mLs by mouth 2 (two) times daily.    Marland Kitchen aspirin EC 81 MG tablet Take 325 mg by mouth daily.     . budesonide (ENTOCORT EC) 3 MG 24 hr capsule Take 9 mg by mouth daily.     . Calcium Citrate-Vitamin D (CITRACAL MAXIMUM) 315-250 MG-UNIT TABS Take 1 tablet by mouth 3 (three) times daily.    . cholecalciferol (VITAMIN D) 1000 units tablet Take 1,000 Units by mouth daily.     . Coenzyme Q10 (COQ10) 200 MG CAPS Take 1 capsule by mouth daily.     . ferrous sulfate 325 (65 FE) MG EC tablet Take 325 mg by mouth daily.    Marland Kitchen gabapentin (NEURONTIN) 300 MG capsule Take 300 mg by mouth 3 (three) times daily.    . iron polysaccharides (NIFEREX) 150 MG capsule Take 150 mg by mouth daily.    Marland Kitchen loperamide (LOPERAMIDE A-D) 2 MG tablet Take 2 mg by mouth 3 (three) times daily.     . magnesium oxide (MAG-OX) 400 (241.3 Mg) MG tablet Take 1 tablet (400 mg total) by mouth daily. 30 tablet 0  . Multiple Vitamin (MULTIVITAMIN) tablet Take 1 tablet by mouth daily.    Marland Kitchen NEXIUM 40 MG capsule Take 40 mg by mouth daily.     . NON FORMULARY Diet Type:  regular    . Nutritional Supplements (ENSURE ENLIVE PO) Take 1 Bottle by mouth 2 (two) times daily.    . potassium chloride SA (K-DUR,KLOR-CON) 20 MEQ tablet Take 20 mEq by mouth 2 (two) times daily.    . prednisoLONE acetate (PRED FORTE) 1 % ophthalmic suspension Place 1 drop into both eyes at bedtime.    . Probiotic Product (RISA-BID PROBIOTIC) TABS Take 1 tablet by mouth daily.    .  traMADol (ULTRAM) 50 MG tablet Take 1 tablet (50 mg total) by mouth every 6 (six) hours. (Patient taking differently: Take 50 mg by mouth every 6 (six) hours as needed. ) 30 tablet 0  . vitamin B-12 (CYANOCOBALAMIN) 1000 MCG tablet Take 1,000 mcg by mouth daily.    . vitamin C (ASCORBIC ACID) 500 MG tablet Take 500 mg by mouth daily.    Marland Kitchen  HYDROmorphone (DILAUDID) 2 MG tablet Take 0.5 tablets (1 mg total) by mouth every 12 (twelve) hours as needed for severe pain (For pain unrelieved by hydrocodone). (Patient not taking: Reported on 04/21/2019) 5 tablet 0  . lidocaine (LIDODERM) 5 % Apply topically to right knee in the AM and Remove & Discard patch at night within 12 hours or as directed by MD for uncontrolled knee pain    . torsemide (DEMADEX) 5 MG tablet Take 5 mg by mouth daily as needed.      No current facility-administered medications for this visit.       REVIEW OF SYSTEMS (Negative unless checked)  Constitutional: [] Weight loss  [] Fever  [] Chills Cardiac: [] Chest pain   [] Chest pressure   [] Palpitations   [] Shortness of breath when laying flat   [] Shortness of breath at rest   [] Shortness of breath with exertion. Vascular:  [x] Pain in legs with walking   [x] Pain in legs at rest   [] Pain in legs when laying flat   [] Claudication   [] Pain in feet when walking  [] Pain in feet at rest  [] Pain in feet when laying flat   [] History of DVT   [] Phlebitis   [x] Swelling in legs   [x] Varicose veins   [] Non-healing ulcers Pulmonary:   [] Uses home oxygen   [] Productive cough   [] Hemoptysis   [] Wheeze  [] COPD   [] Asthma Neurologic:  [] Dizziness  [] Blackouts   [] Seizures   [] History of stroke   [] History of TIA  [] Aphasia   [] Temporary blindness   [] Dysphagia   [] Weakness or numbness in arms   [x] Weakness or numbness in legs Musculoskeletal:  [x] Arthritis   [] Joint swelling   [x] Joint pain   [x] Low back pain Hematologic:  [] Easy bruising  [] Easy bleeding   [] Hypercoagulable state   [x] Anemic  [] Hepatitis  Gastrointestinal:  [] Blood in stool   [] Vomiting blood  [] Gastroesophageal reflux/heartburn   [x] Abdominal pain Genitourinary:  [] Chronic kidney disease   [] Difficult urination  [] Frequent urination  [] Burning with urination   [] Hematuria Skin:  [] Rashes   [] Ulcers   [] Wounds Psychological:  [] History of anxiety   []  History of major depression.    Physical Exam BP (!) 154/69 (BP Location: Left Arm)   Pulse 85   Resp 16   Ht 5' 1"  (1.549 m)   Wt 166 lb (75.3 kg)   BMI 31.37 kg/m  Gen:  WD/WN, NAD Head: [place]/AT, No temporalis wasting.  Ear/Nose/Throat: Hearing grossly intact, nares w/o erythema or drainage, oropharynx w/o Erythema/Exudate Eyes: Conjunctiva clear, sclera non-icteric  Neck: trachea midline.  No JVD.  Pulmonary:  Good air movement, respirations not labored, no use of accessory muscles  Cardiac: RRR, no JVD Vascular:  Vessel Right Left  Radial Palpable Palpable                          DP trace 1+  PT NP NP   Gastrointestinal:. No masses, surgical incisions, or scars. Musculoskeletal: M/S 5/5 throughout.  Extremities without ischemic changes.  No deformity or atrophy. 3+ RLE edema, 2-3+ LLE edema. In a wheelchair. Neurologic: Sensation grossly intact in extremities.  Symmetrical.  Speech is fluent. Motor exam as listed above. Psychiatric: Judgment intact, Mood & affect appropriate for pt's clinical situation. Dermatologic: superficial wounds on the right lower leg with mild weeping.    Radiology No results found.  Labs No results found for this or any previous visit (from the past 2160 hour(s)).  Assessment/Plan:  Benign essential  HTN blood pressure control important in reducing the progression of atherosclerotic disease. On appropriate oral medications.   Hyperlipidemia, unspecified lipid control important in reducing the progression of atherosclerotic disease. Continue statin therapy   Spinal stenosis of lumbar region with radiculopathy Limits  her mobility, worsening leg swelling  Swelling of limb The patient has pretty severe swelling of both lower extremities.  He has weeping and skin breakdown on the right.  She cannot get her legs and compression stockings.  At this point, placing the patient in [place] boots for several weeks will be our best option to get the skin healed and get the swelling under control.  A 3 layer Unna boot was placed on both lower extremities today and should be changed weekly.  We can either do that at the office or they can do that at her assisted living with home health.  Ultimately, she will need a compression stocking and a prescription was given today.  She also likely has significant lymphedema from chronic scarring and lymphatic channels and would benefit from a lymphedema pump.  A venous reflux study will be performed to evaluate for thrombotic or reflux issues within the venous system.  We will see the patient back in a few weeks after Unna boots to assess her response and go over her noninvasive study.  Lymphedema The patient has likely developed lymphedema from chronic scarring and lymphatic channels.  A lymphedema pump will be an excellent adjuvant option to help treat her swelling.  Compression, elevation, and increasing activity will also be of significant benefit.      Leotis Pain 04/22/2019, 1:17 PM   This note was created with Dragon medical transcription system.  Any errors from dictation are unintentional.

## 2019-04-21 NOTE — Assessment & Plan Note (Signed)
blood pressure control important in reducing the progression of atherosclerotic disease. On appropriate oral medications.  

## 2019-04-21 NOTE — Assessment & Plan Note (Signed)
lipid control important in reducing the progression of atherosclerotic disease. Continue statin therapy  

## 2019-04-22 DIAGNOSIS — I890 Lymphedema, not elsewhere classified: Secondary | ICD-10-CM | POA: Insufficient documentation

## 2019-04-22 DIAGNOSIS — M7989 Other specified soft tissue disorders: Secondary | ICD-10-CM | POA: Insufficient documentation

## 2019-04-22 NOTE — Assessment & Plan Note (Signed)
Limits her mobility, worsening leg swelling

## 2019-04-22 NOTE — Patient Instructions (Signed)

## 2019-04-22 NOTE — Assessment & Plan Note (Signed)
The patient has likely developed lymphedema from chronic scarring and lymphatic channels.  A lymphedema pump will be an excellent adjuvant option to help treat her swelling.  Compression, elevation, and increasing activity will also be of significant benefit.

## 2019-04-22 NOTE — Assessment & Plan Note (Signed)
The patient has pretty severe swelling of both lower extremities.  He has weeping and skin breakdown on the right.  She cannot get her legs and compression stockings.  At this point, placing the patient in [place] boots for several weeks will be our best option to get the skin healed and get the swelling under control.  A 3 layer Unna boot was placed on both lower extremities today and should be changed weekly.  We can either do that at the office or they can do that at her assisted living with home health.  Ultimately, she will need a compression stocking and a prescription was given today.  She also likely has significant lymphedema from chronic scarring and lymphatic channels and would benefit from a lymphedema pump.  A venous reflux study will be performed to evaluate for thrombotic or reflux issues within the venous system.  We will see the patient back in a few weeks after Unna boots to assess her response and go over her noninvasive study.

## 2019-04-28 ENCOUNTER — Encounter (INDEPENDENT_AMBULATORY_CARE_PROVIDER_SITE_OTHER): Payer: Medicare Other

## 2019-05-05 ENCOUNTER — Encounter (INDEPENDENT_AMBULATORY_CARE_PROVIDER_SITE_OTHER): Payer: Medicare Other

## 2019-05-13 ENCOUNTER — Ambulatory Visit (INDEPENDENT_AMBULATORY_CARE_PROVIDER_SITE_OTHER): Payer: Medicare Other

## 2019-05-13 ENCOUNTER — Encounter (INDEPENDENT_AMBULATORY_CARE_PROVIDER_SITE_OTHER): Payer: Self-pay | Admitting: Nurse Practitioner

## 2019-05-13 ENCOUNTER — Other Ambulatory Visit: Payer: Self-pay

## 2019-05-13 ENCOUNTER — Ambulatory Visit (INDEPENDENT_AMBULATORY_CARE_PROVIDER_SITE_OTHER): Payer: Medicare Other | Admitting: Nurse Practitioner

## 2019-05-13 VITALS — BP 118/64 | HR 74 | Resp 14 | Ht 61.0 in | Wt 170.0 lb

## 2019-05-13 DIAGNOSIS — I872 Venous insufficiency (chronic) (peripheral): Secondary | ICD-10-CM

## 2019-05-13 DIAGNOSIS — I890 Lymphedema, not elsewhere classified: Secondary | ICD-10-CM

## 2019-05-13 DIAGNOSIS — R600 Localized edema: Secondary | ICD-10-CM | POA: Diagnosis not present

## 2019-05-13 DIAGNOSIS — K219 Gastro-esophageal reflux disease without esophagitis: Secondary | ICD-10-CM

## 2019-05-13 DIAGNOSIS — M7989 Other specified soft tissue disorders: Secondary | ICD-10-CM

## 2019-05-21 ENCOUNTER — Encounter (INDEPENDENT_AMBULATORY_CARE_PROVIDER_SITE_OTHER): Payer: Self-pay | Admitting: Nurse Practitioner

## 2019-05-21 NOTE — Progress Notes (Signed)
SUBJECTIVE:  Patient ID: Joanne [name], female    DOB: 1930-09-27, 85 y.o.   MRN: [id number] Chief Complaint  Patient presents with  . Follow-up    HPI  Joanne [name] is a 85 y.o. female that presents today for wound and swelling evaluation after four weeks of unna wraps. The patient's legs appear much better although some edema remains.  There are no wounds or weeping present.  The patient states that the legs feel much better. Patient remains wheelchair bound.    Non invasive studies show reflux in the bilateral common femoral veins and great saphenous veins.  There was no presence of DVT or superficial venous thrombosis.    Past Medical History:  Diagnosis Date  . Anemia, unspecified    B12 and iron deficiency   . Arthritis   . B12 deficiency   . Collagen vascular disease ([place])   . Crohn disease ([place])   . Crohn's disease ([place])    followed by Dr. Vira Agar with serial colonoscopies  . Dyspnea   . Fuchs' endothelial dystrophy   . GERD (gastroesophageal reflux disease)   . History of bone density study 09/01/2012  . History of spinal fracture   . Hyperlipidemia, unspecified   . Hypertension   . Hypokalemia   . Hypomagnesemia   . Osteoporosis   . Other dysphagia 01/24/2017  . Pneumonia    1980's  . Protein calorie malnutrition ([place])   . Retinal detachment   . Right shoulder injury 05/2002   treated conservatively  . Trigger finger   . Zenker diverticulum 09/25/2016    Past Surgical History:  Procedure Laterality Date  . APPENDECTOMY  1949  . CATARACT EXTRACTION EXTRACAPSULAR Right 05/10/2015   Procedure: EXTRACTION CATARACT EXTRACAPSULAR W/INSERTION INTRAOCULAR PROSTHESIS; Surgeon: Doyce Para, MD; [facility]; Service: Ophthalmology; Laterality: Right;  . CHOLECYSTECTOMY  1992  . COLONOSCOPY     03/24/1992, 03/11/1998, 07/08/2002, 05/08/2006, 01/31/2012   PH Crohns disease; no repeat due to age per RTE (dw)  . CORNEAL TRANSPLANT Left 07/08/2012   Procedure:  CORNEAL TRANSPLANT DSAEK ** tissue ordered OSI 06/27/2012; Surgeon: Doyce Para, MD; Location: [facility]; Service: Ophthalmology;  . ESOPHAGOGASTRODUODENOSCOPY     05/01/2005, 01/31/2012 ; No repeat per RTE  . ESOPHAGOGASTRODUODENOSCOPY (EGD) WITH PROPOFOL N/A 05/30/2018   Procedure: ESOPHAGOGASTRODUODENOSCOPY (EGD) WITH PROPOFOL;  Surgeon: Manya Silvas, MD;  Location[facility];  Service: Endoscopy;  Laterality: N/A;  . EYE SURGERY  2012, 2013   5 total  . JOINT REPLACEMENT Right 01-28-13  . KYPHOPLASTY N/A 08/12/2018   Procedure: KYPHOPLASTY L3;  Surgeon: Hessie Knows, MD;  Location: [facility] ORS;  Service: Orthopedics;  Laterality: N/A;  . LENSECTOMY PHACOFRAGMENTATION WITH ASPIRATION Left 07/25/2011  . TONSILLECTOMY  1950  . TOTAL HIP ARTHROPLASTY Left 12/23/2017   Procedure: TOTAL HIP ARTHROPLASTY;  Surgeon: Dereck Leep, MD;  Location: [facility] ORS;  Service: Orthopedics;  Laterality: Left;  Marland Kitchen VITREOUS RETINAL SURGERY Left 11/07/2011   PPV/SO  . VITREOUS RETINAL SURGERY Left 04/18/2011   TPPV/TRP/SO/SB/EL/C3F8    Social History   Socioeconomic History  . Marital status: Widowed    Spouse name: Not on file  . Number of children: 2  . Years of education: college  . Highest education level: Not on file  Occupational History  . Occupation: retired    Comment: Pharmacist, hospital  Social Needs  . Financial resource strain: Not on file  . Food insecurity    Worry: Not on file  Inability: Not on file  . Transportation needs    Medical: Not on file    Non-medical: Not on file  Tobacco Use  . Smoking status: Former Smoker    Packs/day: 0.50    Years: 35.00    Pack years: 17.50    Types: Cigarettes    Quit date: 01/29/1991    Years since quitting: 28.3  . Smokeless tobacco: Never Used  Substance and Sexual Activity  . Alcohol use: Yes    Comment: 3x/yr  . Drug use: No  . Sexual activity: Never  Lifestyle  . Physical activity    Days per week: Not on file    Minutes per session:  Not on file  . Stress: Not on file  Relationships  . Social Herbalist on phone: Not on file    Gets together: Not on file    Attends religious service: Not on file    Active member of club or organization: Not on file    Attends meetings of clubs or organizations: Not on file    Relationship status: Not on file  . Intimate partner violence    Fear of current or ex partner: Not on file    Emotionally abused: Not on file    Physically abused: Not on file    Forced sexual activity: Not on file  Other Topics Concern  . Not on file  Social History Narrative   Widowed   Former smoker   Occasional drink 3 x a year   2 children    Full Code    Family History  Problem Relation Age of Onset  . Hypertension Mother   . Stroke Father   . Hypertension Father   . Arthritis Other   . Prostate cancer Other   . Psoriasis Other   . Stroke Other   . Thyroid disease Other   . Anesthesia problems Neg Hx   . Diabetes Neg Hx   . Glaucoma Neg Hx   . Macular degeneration Neg Hx     Allergies  Allergen Reactions  . Oxycodone Other (See Comments)    "makes her crazy"     Review of Systems   Review of Systems: Negative Unless Checked Constitutional: [] Weight loss  [] Fever  [] Chills Cardiac: [] Chest pain   []  Atrial Fibrillation  [] Palpitations   [] Shortness of breath when laying flat   [] Shortness of breath with exertion. [] Shortness of breath at rest Vascular:  [x] Pain in legs with walking   [] Pain in legs with standing [] Pain in legs when laying flat   [] Claudication    [] Pain in feet when laying flat    [] History of DVT   [] Phlebitis   [x] Swelling in legs   [x] Varicose veins   [] Non-healing ulcers Pulmonary:   [] Uses home oxygen   [] Productive cough   [] Hemoptysis   [] Wheeze  [] COPD   [] Asthma Neurologic:  [] Dizziness   [] Seizures  [] Blackouts [] History of stroke   [] History of TIA  [] Aphasia   [] Temporary Blindness   [] Weakness or numbness in arm   [x] Weakness or numbness in  leg Musculoskeletal:   [] Joint swelling   [] Joint pain   [] Low back pain  []  History of Knee Replacement [] Arthritis [] back Surgeries  []  Spinal Stenosis    Hematologic:  [] Easy bruising  [] Easy bleeding   [] Hypercoagulable state   [x] Anemic Gastrointestinal:  [] Diarrhea   [] Vomiting  [] Gastroesophageal reflux/heartburn   [] Difficulty swallowing. [x] Abdominal pain Genitourinary:  [] Chronic kidney disease   [] Difficult urination  []   Anuric   [] Blood in urine [] Frequent urination  [] Burning with urination   [] Hematuria Skin:  [] Rashes   [] Ulcers [] Wounds Psychological:  [] History of anxiety   []  History of major depression  []  Memory Difficulties      OBJECTIVE:   Physical Exam  BP 118/64 (BP Location: Left Arm, Patient Position: Sitting, Cuff Size: Normal)   Pulse 74   Resp 14   Ht 5' 1"  (1.549 m)   Wt 170 lb (77.1 kg)   BMI 32.12 kg/m   Gen: WD/WN, NAD Head: [place]/AT, No temporalis wasting.  Ear/Nose/Throat: Hearing grossly intact, nares w/o erythema or drainage Eyes: PER, EOMI, sclera nonicteric.  Neck: Supple, no masses.  No JVD.  Pulmonary:  Good air movement, no use of accessory muscles.  Cardiac: RRR Vascular: wounds healed. No weeping 2+ edema bilaterally  Vessel Right Left  Radial Palpable Palpable  Dorsalis Pedis Palpable Palpable  Posterior Tibial Not Palpable Not Palpable   Gastrointestinal: soft, non-distended. No guarding/no peritoneal signs.  Musculoskeletal: M/S 5/5 throughout.  No deformity or atrophy.  Neurologic: Pain and light touch intact in extremities.  Symmetrical.  Speech is fluent. Motor exam as listed above. Psychiatric: Judgment intact, Mood & affect appropriate for pt's clinical situation. Dermatologic: No Venous rashes. No Ulcers Noted.  No changes consistent with cellulitis. Lymph : No Cervical lymphadenopathy, dermal thickening bilaterally        ASSESSMENT AND PLAN:  1. Lymphedema Recommend:  No surgery or intervention at this point in time.   Patient will transition from unna wraps back to compression socks.   I have reviewed my previous discussion with the patient regarding swelling and why it causes symptoms.  Patient will continue wearing graduated compression stockings class 1 (20-30 mmHg) on a daily basis. The patient will  beginning wearing the stockings first thing in the morning and removing them in the evening. The patient is instructed specifically not to sleep in the stockings.    In addition, behavioral modification including several periods of elevation of the lower extremities during the day will be continued.  This was reviewed with the patient during the initial visit.  The patient will also continue routine exercise, especially walking on a daily basis as was discussed during the initial visit.    Despite conservative treatments including graduated compression therapy class 1 and behavioral modification including exercise and elevation the patient  has not obtained adequate control of the lymphedema.  The patient still has stage 3 lymphedema and therefore, I believe that a lymph pump should be added to improve the control of the patient's lymphedema.  Additionally, a lymph pump is warranted because it will reduce the risk of cellulitis and ulceration in the future.  Patient should follow-up in six months    2. Chronic venous insufficiency Discussed the option of an endovenous laser ablation with the patient.  She wishes to pursue conservative, non invasive methods at this time.  See above.   3. GERD without esophagitis Continue PPI as already ordered, this medication has been reviewed and there are no changes at this time.  Avoidence of caffeine and alcohol  Moderate elevation of the head of the bed    Current Outpatient Medications on File Prior to Visit  Medication Sig Dispense Refill  . acetaminophen (TYLENOL) 500 MG tablet Take 500 mg by mouth every 8 (eight) hours as needed for mild pain or moderate pain.      Marland Kitchen alendronate (FOSAMAX) 70 MG tablet Take 1 tablet (70 mg  total) by mouth once a week. Take with a full glass of water on an empty stomach. 4 tablet 0  . Amino Acids-Protein Hydrolys (FEEDING SUPPLEMENT, PRO-STAT SUGAR FREE 64,) LIQD Take 30 mLs by mouth 2 (two) times daily.    Marland Kitchen aspirin EC 81 MG tablet Take 325 mg by mouth daily.     . budesonide (ENTOCORT EC) 3 MG 24 hr capsule Take 9 mg by mouth daily.     . Calcium Citrate-Vitamin D (CITRACAL MAXIMUM) 315-250 MG-UNIT TABS Take 1 tablet by mouth 3 (three) times daily.    . cholecalciferol (VITAMIN D) 1000 units tablet Take 1,000 Units by mouth daily.     . clotrimazole (LOTRIMIN) 1 % cream Apply 1 application topically 2 (two) times daily.    Marland Kitchen gabapentin (NEURONTIN) 300 MG capsule Take 300 mg by mouth 3 (three) times daily.    . iron polysaccharides (NIFEREX) 150 MG capsule Take 150 mg by mouth daily.    Marland Kitchen loperamide (LOPERAMIDE A-D) 2 MG tablet Take 2 mg by mouth 3 (three) times daily.     Marland Kitchen NEXIUM 40 MG capsule Take 40 mg by mouth daily.     . potassium chloride SA (K-DUR,KLOR-CON) 20 MEQ tablet Take 20 mEq by mouth 2 (two) times daily.    . prednisoLONE acetate (PRED FORTE) 1 % ophthalmic suspension Place 1 drop into both eyes at bedtime.    . Probiotic Product (RISA-BID PROBIOTIC) TABS Take 1 tablet by mouth daily.    . traMADol (ULTRAM) 50 MG tablet Take 1 tablet (50 mg total) by mouth every 6 (six) hours. (Patient taking differently: Take 50 mg by mouth every 6 (six) hours as needed. ) 30 tablet 0  . vitamin B-12 (CYANOCOBALAMIN) 1000 MCG tablet Take 1,000 mcg by mouth daily.    . vitamin C (ASCORBIC ACID) 500 MG tablet Take 500 mg by mouth daily.    . Coenzyme Q10 (COQ10) 200 MG CAPS Take 1 capsule by mouth daily.     . ferrous sulfate 325 (65 FE) MG EC tablet Take 325 mg by mouth daily.    Marland Kitchen HYDROmorphone (DILAUDID) 2 MG tablet Take 0.5 tablets (1 mg total) by mouth every 12 (twelve) hours as needed for severe pain (For pain  unrelieved by hydrocodone). (Patient not taking: Reported on 04/21/2019) 5 tablet 0  . lidocaine (LIDODERM) 5 % Apply topically to right knee in the AM and Remove & Discard patch at night within 12 hours or as directed by MD for uncontrolled knee pain    . magnesium oxide (MAG-OX) 400 (241.3 Mg) MG tablet Take 1 tablet (400 mg total) by mouth daily. 30 tablet 0  . Multiple Vitamin (MULTIVITAMIN) tablet Take 1 tablet by mouth daily.    . NON FORMULARY Diet Type:  regular    . Nutritional Supplements (ENSURE ENLIVE PO) Take 1 Bottle by mouth 2 (two) times daily.    Marland Kitchen torsemide (DEMADEX) 5 MG tablet Take 5 mg by mouth daily as needed.      No current facility-administered medications on file prior to visit.     There are no Patient Instructions on file for this visit. No follow-ups on file.   Kris Hartmann, NP  This note was completed with Sales executive.  Any errors are purely unintentional.

## 2019-06-10 ENCOUNTER — Telehealth (INDEPENDENT_AMBULATORY_CARE_PROVIDER_SITE_OTHER): Payer: Self-pay | Admitting: Nurse Practitioner

## 2019-06-10 NOTE — Telephone Encounter (Signed)
Start placing the patient in Zinc Oxide Unna Wraps to be changed weekly for four weeks.

## 2019-06-10 NOTE — Telephone Encounter (Signed)
[place] with [facility] home [facility] [phone] calling stating that patient has increased edema with weeping and that she in concerned of a venus ulcer starting. Requesting wound order of advise. Please advise. AS, CMA

## 2019-06-10 NOTE — Telephone Encounter (Signed)
Spoke with Joanne [name], she is aware of the below and verbalized understanding. AS, CMA

## 2019-07-08 ENCOUNTER — Telehealth (INDEPENDENT_AMBULATORY_CARE_PROVIDER_SITE_OTHER): Payer: Self-pay | Admitting: Vascular Surgery

## 2019-07-08 NOTE — Telephone Encounter (Signed)
No concerns as long as there are no open wounds

## 2019-11-17 ENCOUNTER — Ambulatory Visit (INDEPENDENT_AMBULATORY_CARE_PROVIDER_SITE_OTHER): Payer: Medicare Other | Admitting: Vascular Surgery

## 2019-12-15 ENCOUNTER — Other Ambulatory Visit: Payer: Self-pay

## 2019-12-15 ENCOUNTER — Encounter (INDEPENDENT_AMBULATORY_CARE_PROVIDER_SITE_OTHER): Payer: Self-pay | Admitting: Nurse Practitioner

## 2019-12-15 ENCOUNTER — Ambulatory Visit (INDEPENDENT_AMBULATORY_CARE_PROVIDER_SITE_OTHER): Payer: Medicare Other | Admitting: Nurse Practitioner

## 2019-12-15 VITALS — BP 147/82 | HR 90 | Resp 16 | Ht 62.0 in | Wt 172.0 lb

## 2019-12-15 DIAGNOSIS — I10 Essential (primary) hypertension: Secondary | ICD-10-CM | POA: Diagnosis not present

## 2019-12-15 DIAGNOSIS — I890 Lymphedema, not elsewhere classified: Secondary | ICD-10-CM | POA: Diagnosis not present

## 2019-12-16 ENCOUNTER — Encounter (INDEPENDENT_AMBULATORY_CARE_PROVIDER_SITE_OTHER): Payer: Self-pay | Admitting: Nurse Practitioner

## 2019-12-16 NOTE — Progress Notes (Signed)
Subjective:    Patient ID: Joanne [name], female    DOB: 07-06-1931, 86 y.o.   MRN: [id number] Chief Complaint  Patient presents with  . Follow-up    6 mo f/u, no studies    Today the patient presents for 28-monthfollow-up to evaluate her lower extremity edema.  The patient has recently been struggling with swelling that is generalized including her hands.  She has also had some shortness of breath but she has been started on a new fluid pill by her primary care provider.  To help with her swelling.  The patient has continued to utilize her lymphedema pump and has been a very positive factor in her care be patient had her legs wrapped by the home health facility however the wraps were too tight and actually to the patient's leg.  The patient has been diligently elevating her lower extremities however despite all of her conservative therapy tactics the patient has not been able to get control of her swelling.   Review of Systems  Respiratory: Positive for shortness of breath.   Cardiovascular: Positive for leg swelling.  Gastrointestinal: Positive for diarrhea.  Neurological: Positive for weakness.  All other systems reviewed and are negative.      Objective:   Physical Exam Vitals reviewed. Exam conducted with a chaperone present (Daughter present).  HENT:     Head: Normocephalic.  Cardiovascular:     Pulses: Normal pulses.  Pulmonary:     Effort: Pulmonary effort is normal.     Breath sounds: Normal breath sounds.  Musculoskeletal:     Right lower leg: 3+ Edema present.     Left lower leg: 3+ Edema present.  Neurological:     Mental Status: She is alert and oriented to person, place, and time.  Psychiatric:        Mood and Affect: Mood normal.        Behavior: Behavior normal.        Thought Content: Thought content normal.        Judgment: Judgment normal.     BP (!) 147/82 (BP Location: Left Arm)   Pulse 90   Resp 16   Ht 5' 2"  (1.575 m)   Wt 172 lb (78 kg)    BMI 31.46 kg/m   Past Medical History:  Diagnosis Date  . Anemia, unspecified    B12 and iron deficiency   . Arthritis   . B12 deficiency   . Collagen vascular disease ([place]   . Crohn disease ([place]   . Crohn's disease ([place]    followed by Dr. EVira Agarwith serial colonoscopies  . Dyspnea   . Fuchs' endothelial dystrophy   . GERD (gastroesophageal reflux disease)   . History of bone density study 09/01/2012  . History of spinal fracture   . Hyperlipidemia, unspecified   . Hypertension   . Hypokalemia   . Hypomagnesemia   . Osteoporosis   . Other dysphagia 01/24/2017  . Pneumonia    1980's  . Protein calorie malnutrition ([facility]   . Retinal detachment   . Right shoulder injury 05/2002   treated conservatively  . Trigger finger   . Zenker diverticulum 09/25/2016    Social History   Socioeconomic History  . Marital status: Widowed    Spouse name: Not on file  . Number of children: 2  . Years of education: college  . Highest education level: Not on file  Occupational History  . Occupation: retired    Comment: tPharmacist, hospital  Tobacco Use  . Smoking status: Former Smoker    Packs/day: 0.50    Years: 35.00    Pack years: 17.50    Types: Cigarettes    Quit date: 01/29/1991    Years since quitting: 28.8  . Smokeless tobacco: Never Used  Substance and Sexual Activity  . Alcohol use: Yes    Comment: 3x/yr  . Drug use: No  . Sexual activity: Never  Other Topics Concern  . Not on file  Social History Narrative   Widowed   Former smoker   Occasional drink 3 x a year   2 children    Full Code   Social Determinants of Health   Financial Resource Strain:   . Difficulty of Paying Living Expenses:   Food Insecurity:   . Worried About Charity fundraiser in the Last Year:   . Arboriculturist in the Last Year:   Transportation Needs:   . Film/video editor (Medical):   Marland Kitchen Lack of Transportation (Non-Medical):   Physical Activity:   . Days of Exercise per Week:   .  Minutes of Exercise per Session:   Stress:   . Feeling of Stress :   Social Connections:   . Frequency of Communication with Friends and Family:   . Frequency of Social Gatherings with Friends and Family:   . Attends Religious Services:   . Active Member of Clubs or Organizations:   . Attends Archivist Meetings:   Marland Kitchen Marital Status:   Intimate Partner Violence:   . Fear of Current or Ex-Partner:   . Emotionally Abused:   Marland Kitchen Physically Abused:   . Sexually Abused:     Past Surgical History:  Procedure Laterality Date  . APPENDECTOMY  1949  . CATARACT EXTRACTION EXTRACAPSULAR Right 05/10/2015   Procedure: EXTRACTION CATARACT EXTRACAPSULAR W/INSERTION INTRAOCULAR PROSTHESIS; Surgeon: Doyce Para, MD; [facility]; Service: Ophthalmology; Laterality: Right;  . CHOLECYSTECTOMY  1992  . COLONOSCOPY     03/24/1992, 03/11/1998, 07/08/2002, 05/08/2006, 01/31/2012   PH Crohns disease; no repeat due to age per RTE (dw)  . CORNEAL TRANSPLANT Left 07/08/2012   Procedure: CORNEAL TRANSPLANT DSAEK ** tissue ordered OSI 06/27/2012; Surgeon: Doyce Para, MD; Location: [facility]; Service: Ophthalmology;  . ESOPHAGOGASTRODUODENOSCOPY     05/01/2005, 01/31/2012 ; No repeat per RTE  . ESOPHAGOGASTRODUODENOSCOPY (EGD) WITH PROPOFOL N/A 05/30/2018   Procedure: ESOPHAGOGASTRODUODENOSCOPY (EGD) WITH PROPOFOL;  Surgeon: Manya Silvas, MD;  Location: [facility]COPY;  Service: Endoscopy;  Laterality: N/A;  . EYE SURGERY  2012, 2013   5 total  . JOINT REPLACEMENT Right 01-28-13  . KYPHOPLASTY N/A 08/12/2018   Procedure: KYPHOPLASTY L3;  Surgeon: Hessie Knows, MD;  Location: [facility] ORS;  Service: Orthopedics;  Laterality: N/A;  . LENSECTOMY PHACOFRAGMENTATION WITH ASPIRATION Left 07/25/2011  . TONSILLECTOMY  1950  . TOTAL HIP ARTHROPLASTY Left 12/23/2017   Procedure: TOTAL HIP ARTHROPLASTY;  Surgeon: Dereck Leep, MD;  Location: [facility] ORS;  Service: Orthopedics;  Laterality: Left;  Marland Kitchen VITREOUS  RETINAL SURGERY Left 11/07/2011   PPV/SO  . VITREOUS RETINAL SURGERY Left 04/18/2011   TPPV/TRP/SO/SB/EL/C3F8    Family History  Problem Relation Age of Onset  . Hypertension Mother   . Stroke Father   . Hypertension Father   . Arthritis Other   . Prostate cancer Other   . Psoriasis Other   . Stroke Other   . Thyroid disease Other   . Anesthesia problems Neg Hx   .  Diabetes Neg Hx   . Glaucoma Neg Hx   . Macular degeneration Neg Hx     Allergies  Allergen Reactions  . Oxycodone Other (See Comments)    "makes her crazy"       Assessment & Plan:   1. Lymphedema   Today due to the patient extensive edema in her lower extremities we will wrap the patient in Ramos wraps.  Instead of having the patient present to the office on a weekly basis for changes, the patient will remove her wraps at home in 1 week.  We will then have her follow-up in office in 2 weeks to evaluate her lower extremity edema.  Depending on her progression with her fluid pill in addition to how the Unna wrap has helped her edema we will discuss whether we will have the patient come to the office weekly for wraps or if there will continue with conservative therapy at home.  Patient and family are advised to contact us sooner if in the interim she develops wounds or sores.  2. Benign essential HTN Continue antihypertensive medications as already ordered, these medications have been reviewed and there are no changes at this time.    Current Outpatient Medications on File Prior to Visit  Medication Sig Dispense Refill  . acetaminophen (TYLENOL) 500 MG tablet Take 500 mg by mouth every 8 (eight) hours as needed for mild pain or moderate pain.     Marland Kitchen alendronate (FOSAMAX) 70 MG tablet Take 1 tablet (70 mg total) by mouth once a week. Take with a full glass of water on an empty stomach. 4 tablet 0  . Amino Acids-Protein Hydrolys (FEEDING SUPPLEMENT, PRO-STAT SUGAR FREE 64,) LIQD Take 30 mLs by mouth 2 (two) times  daily.    Marland Kitchen aspirin EC 81 MG tablet Take 325 mg by mouth daily.     . budesonide (ENTOCORT EC) 3 MG 24 hr capsule Take 9 mg by mouth daily.     . Calcium Citrate-Vitamin D (CITRACAL MAXIMUM) 315-250 MG-UNIT TABS Take 1 tablet by mouth 3 (three) times daily.    . cholecalciferol (VITAMIN D) 1000 units tablet Take 1,000 Units by mouth daily.     . clotrimazole (LOTRIMIN) 1 % cream Apply 1 application topically 2 (two) times daily.    . Coenzyme Q10 (COQ10) 200 MG CAPS Take 1 capsule by mouth daily.     . DULoxetine (CYMBALTA) 20 MG capsule Take 20 mg by mouth daily.    . ferrous sulfate 325 (65 FE) MG EC tablet Take 325 mg by mouth daily.    Marland Kitchen gabapentin (NEURONTIN) 300 MG capsule Take 300 mg by mouth 3 (three) times daily.    . iron polysaccharides (NIFEREX) 150 MG capsule Take 150 mg by mouth daily.    Marland Kitchen lidocaine (LIDODERM) 5 % Apply topically to right knee in the AM and Remove & Discard patch at night within 12 hours or as directed by MD for uncontrolled knee pain    . loperamide (LOPERAMIDE A-D) 2 MG tablet Take 2 mg by mouth 3 (three) times daily.     . magnesium oxide (MAG-OX) 400 (241.3 Mg) MG tablet Take 1 tablet (400 mg total) by mouth daily. 30 tablet 0  . Multiple Vitamin (MULTIVITAMIN) tablet Take 1 tablet by mouth daily.    Marland Kitchen NEXIUM 40 MG capsule Take 40 mg by mouth daily.     . NON FORMULARY Diet Type:  regular    . Nutritional Supplements (ENSURE ENLIVE PO)  Take 1 Bottle by mouth 2 (two) times daily.    . potassium chloride SA (K-DUR,KLOR-CON) 20 MEQ tablet Take 20 mEq by mouth 2 (two) times daily.    . prednisoLONE acetate (PRED FORTE) 1 % ophthalmic suspension Place 1 drop into both eyes at bedtime.    . Probiotic Product (RISA-BID PROBIOTIC) TABS Take 1 tablet by mouth daily.    Marland Kitchen torsemide (DEMADEX) 5 MG tablet Take 5 mg by mouth daily as needed.     . traMADol (ULTRAM) 50 MG tablet Take 1 tablet (50 mg total) by mouth every 6 (six) hours. (Patient taking differently: Take 50  mg by mouth every 6 (six) hours as needed. ) 30 tablet 0  . vitamin B-12 (CYANOCOBALAMIN) 1000 MCG tablet Take 1,000 mcg by mouth daily.    . vitamin C (ASCORBIC ACID) 500 MG tablet Take 500 mg by mouth daily.     No current facility-administered medications on file prior to visit.    There are no Patient Instructions on file for this visit. No follow-ups on file.   Kris Hartmann, NP

## 2019-12-29 ENCOUNTER — Ambulatory Visit (INDEPENDENT_AMBULATORY_CARE_PROVIDER_SITE_OTHER): Payer: Medicare Other | Admitting: Nurse Practitioner

## 2020-01-12 ENCOUNTER — Ambulatory Visit (INDEPENDENT_AMBULATORY_CARE_PROVIDER_SITE_OTHER): Payer: Medicare Other | Admitting: Nurse Practitioner

## 2020-03-15 ENCOUNTER — Emergency Department: Payer: Medicare Other

## 2020-03-15 ENCOUNTER — Inpatient Hospital Stay
Admission: EM | Admit: 2020-03-15 | Discharge: 2020-03-24 | DRG: 871 | Disposition: A | Discharge status: Skilled Nursing Facility | Payer: Medicare Other | Attending: Internal Medicine | Admitting: Internal Medicine

## 2020-03-15 ENCOUNTER — Other Ambulatory Visit: Payer: Self-pay

## 2020-03-15 ENCOUNTER — Inpatient Hospital Stay: Payer: Medicare Other

## 2020-03-15 DIAGNOSIS — Z20822 Contact with and (suspected) exposure to covid-19: Secondary | ICD-10-CM | POA: Diagnosis present

## 2020-03-15 DIAGNOSIS — I10 Essential (primary) hypertension: Secondary | ICD-10-CM

## 2020-03-15 DIAGNOSIS — M810 Age-related osteoporosis without current pathological fracture: Secondary | ICD-10-CM | POA: Diagnosis present

## 2020-03-15 DIAGNOSIS — M79631 Pain in right forearm: Secondary | ICD-10-CM | POA: Diagnosis present

## 2020-03-15 DIAGNOSIS — E876 Hypokalemia: Secondary | ICD-10-CM | POA: Diagnosis not present

## 2020-03-15 DIAGNOSIS — Z947 Corneal transplant status: Secondary | ICD-10-CM

## 2020-03-15 DIAGNOSIS — K602 Anal fissure, unspecified: Secondary | ICD-10-CM | POA: Diagnosis present

## 2020-03-15 DIAGNOSIS — R339 Retention of urine, unspecified: Secondary | ICD-10-CM | POA: Diagnosis present

## 2020-03-15 DIAGNOSIS — K219 Gastro-esophageal reflux disease without esophagitis: Secondary | ICD-10-CM | POA: Diagnosis present

## 2020-03-15 DIAGNOSIS — J9601 Acute respiratory failure with hypoxia: Secondary | ICD-10-CM | POA: Diagnosis not present

## 2020-03-15 DIAGNOSIS — L0390 Cellulitis, unspecified: Secondary | ICD-10-CM

## 2020-03-15 DIAGNOSIS — I110 Hypertensive heart disease with heart failure: Secondary | ICD-10-CM | POA: Diagnosis present

## 2020-03-15 DIAGNOSIS — E785 Hyperlipidemia, unspecified: Secondary | ICD-10-CM | POA: Diagnosis present

## 2020-03-15 DIAGNOSIS — F329 Major depressive disorder, single episode, unspecified: Secondary | ICD-10-CM | POA: Diagnosis present

## 2020-03-15 DIAGNOSIS — K649 Unspecified hemorrhoids: Secondary | ICD-10-CM | POA: Diagnosis present

## 2020-03-15 DIAGNOSIS — K5289 Other specified noninfective gastroenteritis and colitis: Secondary | ICD-10-CM | POA: Diagnosis present

## 2020-03-15 DIAGNOSIS — G9341 Metabolic encephalopathy: Secondary | ICD-10-CM | POA: Diagnosis present

## 2020-03-15 DIAGNOSIS — L03113 Cellulitis of right upper limb: Secondary | ICD-10-CM | POA: Diagnosis present

## 2020-03-15 DIAGNOSIS — Z66 Do not resuscitate: Secondary | ICD-10-CM | POA: Diagnosis present

## 2020-03-15 DIAGNOSIS — R000 Tachycardia, unspecified: Secondary | ICD-10-CM

## 2020-03-15 DIAGNOSIS — R296 Repeated falls: Secondary | ICD-10-CM | POA: Diagnosis present

## 2020-03-15 DIAGNOSIS — L03119 Cellulitis of unspecified part of limb: Secondary | ICD-10-CM | POA: Diagnosis not present

## 2020-03-15 DIAGNOSIS — I4891 Unspecified atrial fibrillation: Secondary | ICD-10-CM

## 2020-03-15 DIAGNOSIS — Z8249 Family history of ischemic heart disease and other diseases of the circulatory system: Secondary | ICD-10-CM

## 2020-03-15 DIAGNOSIS — K5090 Crohn's disease, unspecified, without complications: Secondary | ICD-10-CM | POA: Diagnosis present

## 2020-03-15 DIAGNOSIS — I429 Cardiomyopathy, unspecified: Secondary | ICD-10-CM | POA: Diagnosis present

## 2020-03-15 DIAGNOSIS — Z87891 Personal history of nicotine dependence: Secondary | ICD-10-CM

## 2020-03-15 DIAGNOSIS — Z96642 Presence of left artificial hip joint: Secondary | ICD-10-CM | POA: Diagnosis present

## 2020-03-15 DIAGNOSIS — R1310 Dysphagia, unspecified: Secondary | ICD-10-CM | POA: Diagnosis not present

## 2020-03-15 DIAGNOSIS — Z9889 Other specified postprocedural states: Secondary | ICD-10-CM

## 2020-03-15 DIAGNOSIS — R6520 Severe sepsis without septic shock: Secondary | ICD-10-CM | POA: Diagnosis not present

## 2020-03-15 DIAGNOSIS — I480 Paroxysmal atrial fibrillation: Secondary | ICD-10-CM | POA: Diagnosis present

## 2020-03-15 DIAGNOSIS — I5023 Acute on chronic systolic (congestive) heart failure: Secondary | ICD-10-CM | POA: Diagnosis present

## 2020-03-15 DIAGNOSIS — E538 Deficiency of other specified B group vitamins: Secondary | ICD-10-CM | POA: Diagnosis present

## 2020-03-15 DIAGNOSIS — A419 Sepsis, unspecified organism: Principal | ICD-10-CM | POA: Diagnosis present

## 2020-03-15 DIAGNOSIS — R531 Weakness: Secondary | ICD-10-CM | POA: Diagnosis present

## 2020-03-15 DIAGNOSIS — Z79899 Other long term (current) drug therapy: Secondary | ICD-10-CM

## 2020-03-15 DIAGNOSIS — R062 Wheezing: Secondary | ICD-10-CM

## 2020-03-15 DIAGNOSIS — Z9049 Acquired absence of other specified parts of digestive tract: Secondary | ICD-10-CM

## 2020-03-15 DIAGNOSIS — K6289 Other specified diseases of anus and rectum: Secondary | ICD-10-CM | POA: Diagnosis not present

## 2020-03-15 DIAGNOSIS — R0602 Shortness of breath: Secondary | ICD-10-CM

## 2020-03-15 DIAGNOSIS — Z823 Family history of stroke: Secondary | ICD-10-CM

## 2020-03-15 DIAGNOSIS — G9340 Encephalopathy, unspecified: Secondary | ICD-10-CM | POA: Diagnosis not present

## 2020-03-15 DIAGNOSIS — Z7982 Long term (current) use of aspirin: Secondary | ICD-10-CM

## 2020-03-15 DIAGNOSIS — J90 Pleural effusion, not elsewhere classified: Secondary | ICD-10-CM

## 2020-03-15 DIAGNOSIS — F32A Depression, unspecified: Secondary | ICD-10-CM | POA: Diagnosis present

## 2020-03-15 LAB — BASIC METABOLIC PANEL
Anion gap: 9 (ref 5–15)
BUN: 23 mg/dL (ref 8–23)
CO2: 31 mmol/L (ref 22–32)
Calcium: 7.9 mg/dL — ABNORMAL LOW (ref 8.9–10.3)
Chloride: 95 mmol/L — ABNORMAL LOW (ref 98–111)
Creatinine, Ser: 0.92 mg/dL (ref 0.44–1.00)
GFR calc Af Amer: >60 mL/min (ref >60)
GFR calc non Af Amer: 56 mL/min — ABNORMAL LOW (ref >60)
Glucose, Bld: 123 mg/dL — ABNORMAL HIGH (ref 70–99)
Potassium: 3.7 mmol/L (ref 3.5–5.1)
Sodium: 135 mmol/L (ref 135–145)

## 2020-03-15 LAB — CBC
HCT: 33.4 % — ABNORMAL LOW (ref 36.0–46.0)
Hemoglobin: 10.8 g/dL — ABNORMAL LOW (ref 12.0–15.0)
MCH: 28 pg (ref 26.0–34.0)
MCHC: 32.3 g/dL (ref 30.0–36.0)
MCV: 86.5 fL (ref 80.0–100.0)
Platelets: 488 K/uL — ABNORMAL HIGH (ref 150–400)
RBC: 3.86 MIL/uL — ABNORMAL LOW (ref 3.87–5.11)
RDW: 16.8 % — ABNORMAL HIGH (ref 11.5–15.5)
WBC: 15.1 K/uL — ABNORMAL HIGH (ref 4.0–10.5)
nRBC: 0 % (ref 0.0–0.2)

## 2020-03-15 LAB — URINALYSIS, COMPLETE (UACMP) WITH MICROSCOPIC
Bacteria, UA: NONE SEEN
Bilirubin Urine: NEGATIVE
Glucose, UA: NEGATIVE mg/dL
Hgb urine dipstick: NEGATIVE
Ketones, ur: NEGATIVE mg/dL
Leukocytes,Ua: NEGATIVE
Nitrite: POSITIVE — AB
Protein, ur: NEGATIVE mg/dL
Specific Gravity, Urine: 1.025 (ref 1.005–1.030)
Squamous Epithelial / HPF: NONE SEEN (ref 0–5)
pH: 5 (ref 5.0–8.0)

## 2020-03-15 LAB — SEDIMENTATION RATE: Sed Rate: 39 mm/hr — ABNORMAL HIGH (ref 0–30)

## 2020-03-15 LAB — LACTIC ACID, PLASMA
Lactic Acid, Venous: 1.3 mmol/L (ref 0.5–1.9)
Lactic Acid, Venous: 1.8 mmol/L (ref 0.5–1.9)

## 2020-03-15 LAB — BRAIN NATRIURETIC PEPTIDE: B Natriuretic Peptide: 573.7 pg/mL — ABNORMAL HIGH (ref 0.0–100.0)

## 2020-03-15 LAB — SARS CORONAVIRUS 2 BY RT PCR (HOSPITAL ORDER, PERFORMED IN ~~LOC~~ HOSPITAL LAB): SARS Coronavirus 2: NEGATIVE

## 2020-03-15 LAB — PROCALCITONIN: Procalcitonin: 95.27 ng/mL

## 2020-03-15 MED ORDER — CHLORHEXIDINE GLUCONATE CLOTH 2 % EX PADS
6.0000 | MEDICATED_PAD | Freq: Every day | CUTANEOUS | Status: DC
  Administered 2020-03-16 – 2020-03-20 (×5): 6 via TOPICAL

## 2020-03-15 MED ORDER — CEFAZOLIN SODIUM-DEXTROSE 1-4 GM/50ML-% IV SOLN
1.0000 g | Freq: Once | INTRAVENOUS | Status: AC
  Administered 2020-03-15: 1 g via INTRAVENOUS
  Filled 2020-03-15: qty 50

## 2020-03-15 MED ORDER — LACTATED RINGERS IV BOLUS
1000.0000 mL | Freq: Once | INTRAVENOUS | Status: AC
  Administered 2020-03-15: 1000 mL via INTRAVENOUS

## 2020-03-15 MED ORDER — SODIUM CHLORIDE 0.9 % IV SOLN: INTRAVENOUS | Status: DC

## 2020-03-15 MED ORDER — ACETAMINOPHEN 325 MG PO TABS
650.0000 mg | ORAL_TABLET | Freq: Four times a day (QID) | ORAL | Status: DC | PRN
  Administered 2020-03-16 – 2020-03-19 (×5): 650 mg via ORAL
  Filled 2020-03-15 (×5): qty 2

## 2020-03-15 MED ORDER — SODIUM CHLORIDE 0.9 % IV BOLUS: 1000.0000 mL | Freq: Once | INTRAVENOUS | Status: DC

## 2020-03-15 MED ORDER — ASPIRIN EC 81 MG PO TBEC
81.0000 mg | DELAYED_RELEASE_TABLET | Freq: Every day | ORAL | Status: DC
  Administered 2020-03-15 – 2020-03-19 (×5): 81 mg via ORAL
  Filled 2020-03-15 (×5): qty 1

## 2020-03-15 MED ORDER — PREDNISOLONE ACETATE 1 % OP SUSP
1.0000 [drp] | Freq: Every day | OPHTHALMIC | Status: DC
  Administered 2020-03-15 – 2020-03-23 (×9): 1 [drp] via OPHTHALMIC
  Filled 2020-03-15: qty 1

## 2020-03-15 MED ORDER — IOHEXOL 300 MG/ML  SOLN
100.0000 mL | Freq: Once | INTRAMUSCULAR | Status: AC | PRN
  Administered 2020-03-15: 100 mL via INTRAVENOUS

## 2020-03-15 MED ORDER — HYDROCORTISONE (PERIANAL) 2.5 % EX CREA
TOPICAL_CREAM | Freq: Three times a day (TID) | CUTANEOUS | Status: DC
  Filled 2020-03-15: qty 28.35

## 2020-03-15 MED ORDER — SODIUM CHLORIDE 0.9 % IV SOLN
1.0000 g | INTRAVENOUS | Status: DC
  Administered 2020-03-15 – 2020-03-22 (×8): 1 g via INTRAVENOUS
  Filled 2020-03-15 (×4): qty 1
  Filled 2020-03-15: qty 10
  Filled 2020-03-15 (×4): qty 1
  Filled 2020-03-15: qty 10

## 2020-03-15 MED ORDER — MAGNESIUM OXIDE 400 (241.3 MG) MG PO TABS
400.0000 mg | ORAL_TABLET | Freq: Every day | ORAL | Status: DC
  Administered 2020-03-15 – 2020-03-20 (×6): 400 mg via ORAL
  Filled 2020-03-15 (×6): qty 1

## 2020-03-15 MED ORDER — ASCORBIC ACID 500 MG PO TABS
500.0000 mg | ORAL_TABLET | Freq: Every day | ORAL | Status: DC
  Administered 2020-03-15 – 2020-03-24 (×10): 500 mg via ORAL
  Filled 2020-03-15 (×10): qty 1

## 2020-03-15 MED ORDER — DULOXETINE HCL 20 MG PO CPEP
20.0000 mg | ORAL_CAPSULE | Freq: Every day | ORAL | Status: DC
  Administered 2020-03-15 – 2020-03-24 (×10): 20 mg via ORAL
  Filled 2020-03-15 (×11): qty 1

## 2020-03-15 MED ORDER — TRAMADOL HCL 50 MG PO TABS
50.0000 mg | ORAL_TABLET | Freq: Four times a day (QID) | ORAL | Status: DC | PRN
  Filled 2020-03-15: qty 1

## 2020-03-15 MED ORDER — ENOXAPARIN SODIUM 40 MG/0.4ML ~~LOC~~ SOLN
40.0000 mg | SUBCUTANEOUS | Status: DC
  Administered 2020-03-15 – 2020-03-19 (×5): 40 mg via SUBCUTANEOUS
  Filled 2020-03-15 (×5): qty 0.4

## 2020-03-15 MED ORDER — LIDOCAINE 5 % EX PTCH
1.0000 | MEDICATED_PATCH | CUTANEOUS | Status: DC
  Administered 2020-03-19: 1 via TRANSDERMAL
  Filled 2020-03-15 (×10): qty 1

## 2020-03-15 MED ORDER — BUDESONIDE 3 MG PO CPEP
9.0000 mg | ORAL_CAPSULE | Freq: Every day | ORAL | Status: DC
  Administered 2020-03-15 – 2020-03-24 (×10): 9 mg via ORAL
  Filled 2020-03-15 (×11): qty 3

## 2020-03-15 MED ORDER — ADULT MULTIVITAMIN W/MINERALS CH
ORAL_TABLET | Freq: Every day | ORAL | Status: DC
  Administered 2020-03-15 – 2020-03-24 (×10): 1 via ORAL
  Filled 2020-03-15 (×10): qty 1

## 2020-03-15 MED ORDER — LOPERAMIDE HCL 2 MG PO CAPS
2.0000 mg | ORAL_CAPSULE | Freq: Three times a day (TID) | ORAL | Status: DC | PRN
  Administered 2020-03-15 – 2020-03-18 (×5): 2 mg via ORAL
  Filled 2020-03-15 (×6): qty 1

## 2020-03-15 MED ORDER — ZINC SULFATE 220 (50 ZN) MG PO CAPS
220.0000 mg | ORAL_CAPSULE | Freq: Every day | ORAL | Status: DC
  Administered 2020-03-15 – 2020-03-24 (×10): 220 mg via ORAL
  Filled 2020-03-15 (×11): qty 1

## 2020-03-15 MED ORDER — SODIUM CHLORIDE 0.9 % IV BOLUS
1000.0000 mL | Freq: Once | INTRAVENOUS | Status: AC
  Administered 2020-03-15: 1000 mL via INTRAVENOUS

## 2020-03-15 MED ORDER — HYDRALAZINE HCL 20 MG/ML IJ SOLN: 5.0000 mg | INTRAMUSCULAR | Status: DC | PRN

## 2020-03-15 MED ORDER — GABAPENTIN 300 MG PO CAPS
300.0000 mg | ORAL_CAPSULE | Freq: Three times a day (TID) | ORAL | Status: DC
  Administered 2020-03-15 – 2020-03-24 (×28): 300 mg via ORAL
  Filled 2020-03-15 (×28): qty 1

## 2020-03-15 MED ORDER — PANTOPRAZOLE SODIUM 40 MG PO TBEC
40.0000 mg | DELAYED_RELEASE_TABLET | Freq: Every day | ORAL | Status: DC
  Administered 2020-03-15 – 2020-03-24 (×10): 40 mg via ORAL
  Filled 2020-03-15 (×10): qty 1

## 2020-03-15 MED ORDER — CALCIUM CARBONATE-VITAMIN D 500-200 MG-UNIT PO TABS
1.0000 | ORAL_TABLET | Freq: Three times a day (TID) | ORAL | Status: DC
  Administered 2020-03-15 – 2020-03-24 (×28): 1 via ORAL
  Filled 2020-03-15 (×28): qty 1

## 2020-03-15 NOTE — Progress Notes (Signed)
   03/15/20 2000  Clinical Encounter Type  Visited With Patient and family together  Visit Type Initial  Referral From Nurse  Consult/Referral To Chaplain  Chaplain responded to an OR. Patient requested pray. When Chaplain daughter and patient were talking. Daughter shared information about her siblings and her grandchildren. Patient grabbed chaplain's hand. Chaplain prayed with patient and daughter and one of the monitors started beeping and the daughter said it was the one connected to her heart. She said her mother got excited because she loves to pray. Patient was grateful for pray and thanked chaplain. Chaplain wished them well and told them that she will check back in on patient.

## 2020-03-15 NOTE — H&P (Signed)
History and Physical    Joanne [name] FGH:[id number] DOB: [birth year] DOA: 03/15/2020  Referring MD/NP/PA:   PCP: Tracie Harrier, MD   Patient coming from:  The patient is coming from home.  At baseline, pt is partially dependent for most of ADL.        Chief Complaint: pain, erythema and swelling in right arm and legs  HPI: Joanne [name] is a 86 y.o. female with medical history significant of hypertension, hyperlipidemia, GERD, depression, Fuch's endothelial dystrophy, Crohn's disease, anemia, Zenker's diverticulum, who presents with pain, erythema and swelling in the right arm and the legs.  Per her daughter, pt has leg swelling, tenderness, weeping and erythema in both legs. The right leg is worse than the left which has been going on for several days.  Since yesterday, her right arm also becomes swollen, tender, weeping and erythematous.  Patient does not have fever or chills.  Patient has complaints of anal pain, but no nausea, vomiting, diarrhea or abdominal pain.  Denies chest pain or shortness breath. The patient has mild cough.  No symptoms of UTI.  Per report patient had urinary retention.    ED Course: pt was found to have WBC 15.1, BNP 573, negative COVID-19 PCR, lactic acid 1.3, urinalysis (clear appearance, negative leukocyte, positive nitrite, no bacteria, WBC 0-5), electrolytes renal function okay, temperature normal, blood pressure 181/154, 117/39, tachycardia, tachypnea, oxygen saturation 96% on room air.  CT head is negative for acute intracranial abnormalities.  Patient is admitted to [facility] bed as inpatient.  CT-abd/pelvis: 1. Finding of stercoral colitis/proctitis with perianal inflammation and descent of pelvic floor structures. Anal orifice and perianal regions are incompletely imaged. 2. Contrast mixing versus small filling defect in the RIGHT renal pelvis. Follow-up with urine cytology and urologic evaluation on a nonemergent basis may be helpful with hematuria  evaluation as warranted. 3. Chronic areas of small bowel narrowing and dilation in the setting of Crohn's disease. No signs of perienteric stranding. Mild acute on chronic changes are however considered given appearance. Areas of more proximal ileal distension are slightly increased but without frank evidence of obstruction. 4. Moderate to marked distension of the urinary bladder could be secondarily related to rectal distension but there is no current hydronephrosis. 5. Hepatic steatosis. 6. New small RIGHT effusion. 7. Aortic atherosclerosis.   Review of Systems:   General: no fevers, chills, no body weight gain, has poor appetite, has fatigue HEENT: no blurry vision, hearing changes or sore throat Respiratory: no dyspnea, has coughing, no wheezing CV: no chest pain, no palpitations GI: no nausea, vomiting, abdominal pain, diarrhea, constipation. Has anal pain GU: no dysuria, burning on urination, increased urinary frequency, hematuria  Ext: has pain in both legs and the right arm Neuro: no unilateral weakness, numbness, or tingling, no vision change or hearing loss Skin: no rash, no skin tear. MSK: No muscle spasm, no deformity, no limitation of range of movement in spin Heme: No easy bruising.  Travel history: No recent long distant travel.  Allergy:  Allergies  Allergen Reactions  . Oxycodone Other (See Comments)    "makes her crazy"    Past Medical History:  Diagnosis Date  . Anemia, unspecified    B12 and iron deficiency   . Arthritis   . B12 deficiency   . Collagen vascular disease ([place])   . Crohn disease ([place])   . Crohn's disease ([place])    followed by Dr. Vira Agar with serial colonoscopies  . Dyspnea   . Fuchs'  endothelial dystrophy   . GERD (gastroesophageal reflux disease)   . History of bone density study 09/01/2012  . History of spinal fracture   . Hyperlipidemia, unspecified   . Hypertension   . Hypokalemia   . Hypomagnesemia   . Osteoporosis   .  Other dysphagia 01/24/2017  . Pneumonia    1980's  . Protein calorie malnutrition ([place])   . Retinal detachment   . Right shoulder injury 05/2002   treated conservatively  . Trigger finger   . Zenker diverticulum 09/25/2016    Past Surgical History:  Procedure Laterality Date  . APPENDECTOMY  1949  . CATARACT EXTRACTION EXTRACAPSULAR Right 05/10/2015   Procedure: EXTRACTION CATARACT EXTRACAPSULAR W/INSERTION INTRAOCULAR PROSTHESIS; Surgeon: Doyce Para, MD; [facility]; Service: Ophthalmology; Laterality: Right;  . CHOLECYSTECTOMY  1992  . COLONOSCOPY     03/24/1992, 03/11/1998, 07/08/2002, 05/08/2006, 01/31/2012   PH Crohns disease; no repeat due to age per RTE (dw)  . CORNEAL TRANSPLANT Left 07/08/2012   Procedure: CORNEAL TRANSPLANT DSAEK ** tissue ordered OSI 06/27/2012; Surgeon: Doyce Para, MD; Location: [facility]; Service: Ophthalmology;  . ESOPHAGOGASTRODUODENOSCOPY     05/01/2005, 01/31/2012 ; No repeat per RTE  . ESOPHAGOGASTRODUODENOSCOPY (EGD) WITH PROPOFOL N/A 05/30/2018   Procedure: ESOPHAGOGASTRODUODENOSCOPY (EGD) WITH PROPOFOL;  Surgeon: Manya Silvas, MD;  Location: [facility] ENDOSCOPY;  Service: Endoscopy;  Laterality: N/A;  . EYE SURGERY  2012, 2013   5 total  . JOINT REPLACEMENT Right 01-28-13  . KYPHOPLASTY N/A 08/12/2018   Procedure: KYPHOPLASTY L3;  Surgeon: Hessie Knows, MD;  Location: [facility] ORS;  Service: Orthopedics;  Laterality: N/A;  . LENSECTOMY PHACOFRAGMENTATION WITH ASPIRATION Left 07/25/2011  . TONSILLECTOMY  1950  . TOTAL HIP ARTHROPLASTY Left 12/23/2017   Procedure: TOTAL HIP ARTHROPLASTY;  Surgeon: Dereck Leep, MD;  Location: [facility] ORS;  Service: Orthopedics;  Laterality: Left;  Marland Kitchen VITREOUS RETINAL SURGERY Left 11/07/2011   PPV/SO  . VITREOUS RETINAL SURGERY Left 04/18/2011   TPPV/TRP/SO/SB/EL/C3F8    Social History:  reports that she quit smoking about 29 years ago. Her smoking use included cigarettes. She has a 17.50 pack-year smoking  history. She has never used smokeless tobacco. She reports current alcohol use. She reports that she does not use drugs.  Family History:  Family History  Problem Relation Age of Onset  . Hypertension Mother   . Stroke Father   . Hypertension Father   . Arthritis Other   . Prostate cancer Other   . Psoriasis Other   . Stroke Other   . Thyroid disease Other   . Anesthesia problems Neg Hx   . Diabetes Neg Hx   . Glaucoma Neg Hx   . Macular degeneration Neg Hx      Prior to Admission medications   Medication Sig Start Date End Date Taking? Authorizing Provider  acetaminophen (TYLENOL) 500 MG tablet Take 500 mg by mouth every 8 (eight) hours as needed for mild pain or moderate pain.    Yes [provider]  ascorbic acid (VITAMIN C) 500 MG tablet Take 500 mg by mouth daily.   Yes [provider]  aspirin EC 81 MG tablet Take 81 mg by mouth daily. Swallow whole.   Yes [provider]  budesonide (ENTOCORT EC) 3 MG 24 hr capsule Take 9 mg by mouth daily.    Yes [provider]  calcium-vitamin D (OSCAL WITH D) 500-200 MG-UNIT TABS tablet Take 1 tablet by mouth 3 (three) times daily.  Yes [provider]  chlorthalidone (HYGROTON) 25 MG tablet Take 25 mg by mouth daily. 02/10/20  Yes [provider]  cholecalciferol (VITAMIN D) 1000 units tablet Take 3 tablets by mouth once daily   Yes [provider]  Cyanocobalamin (VITAMIN B-12) 5000 MCG LOZG Subl Place 1 tablet under the tongue once dailY   Yes [provider]  DULoxetine (CYMBALTA) 20 MG capsule Take 20 mg by mouth daily.   Yes [provider]  gabapentin (NEURONTIN) 300 MG capsule TAKE 1 CAPSULE BY MOUTH TWICE DAILY AND 2 CAPSULES NIGHTLY AS DIRECTED   Yes [provider]  hydrocortisone (ANUSOL-HC) 2.5 % rectal cream Apply topically 3 (three) times daily. 03/12/20  Yes [provider]  lidocaine (LIDODERM) 5 % Apply topically to right  knee in the AM and Remove & Discard patch at night within 12 hours or as directed by MD for uncontrolled knee pain  08/23/18  Yes [provider]  loperamide (LOPERAMIDE A-D) 2 MG tablet Take 2 mg by mouth 3 (three) times daily.  08/14/18  Yes [provider]  magnesium oxide (MAG-OX) 400 (241.3 Mg) MG tablet Take 400 mg by mouth daily.   Yes [provider]  Multiple Vitamin (MULTIVITAMIN) tablet Take 1 tablet by mouth daily.   Yes [provider]  NEXIUM 40 MG capsule Take 40 mg by mouth daily.  08/26/13  Yes [provider]  potassium chloride SA (K-DUR,KLOR-CON) 20 MEQ tablet Take 20 mEq by mouth 2 (two) times daily.   Yes [provider]  prednisoLONE acetate (PRED FORTE) 1 % ophthalmic suspension Place 1 drop into both eyes at bedtime.   Yes [provider]  zinc sulfate (ZINC-220) 220 (50 Zn) MG capsule Take 220 mg by mouth daily.   Yes [provider]    Physical Exam: Vitals:   03/15/20 0907 03/15/20 0930 03/15/20 1600 03/15/20 1715  BP:  (!) 117/39 117/64   Pulse: (!) 103 (!) 104  96  Resp: 20 (!) 21 10   Temp:    98.3 F (36.8 C)  TempSrc:    Oral  SpO2: 99% 96%    Weight:      Height:    5' (1.524 m)   General: Not in acute distress HEENT:       Eyes: PERRL, EOMI, no scleral icterus.       ENT: No discharge from the ears and nose, no pharynx injection, no tonsillar enlargement.        Neck: No JVD, no bruit, no mass felt. Heme: No neck lymph node enlargement. Cardiac: S1/S2, RRR, No murmurs, No gallops or rubs. Respiratory: No rales, wheezing, rhonchi or rubs. GI: Soft, nondistended, nontender, no rebound pain, no organomegaly, BS present. GU: No hematuria Ext: 1+DP/PT pulse bilaterally.  Has erythema, tenderness, warmth and swelling in both lower extremities and right arm.  The right leg is worse than the left. Musculoskeletal: No joint deformities, No joint redness or warmth, no limitation of ROM  in spin. Skin: No rashes.  Neuro: Alert, oriented X3, cranial nerves II-XII grossly intact, moves all extremities normally. Psych: Patient is not psychotic, no suicidal or hemocidal ideation.  Labs on Admission: I have personally reviewed following labs and imaging studies  CBC: Recent Labs  Lab 03/15/20 0857  WBC 15.1*  HGB 10.8*  HCT 33.4*  MCV 86.5  PLT 761*   Basic Metabolic Panel: Recent Labs  Lab 03/15/20 0857  NA 135  K 3.7  CL 95*  CO2 31  GLUCOSE 123*  BUN 23  CREATININE 0.92  CALCIUM 7.9*   GFR: Estimated Creatinine Clearance: 40.5 mL/min (by C-G formula based on SCr of 0.92 mg/dL). Liver Function Tests: No results for input(s): AST, ALT, ALKPHOS, BILITOT, PROT, ALBUMIN in the last 168 hours. No results for input(s): LIPASE, AMYLASE in the last 168 hours. No results for input(s): AMMONIA in the last 168 hours. Coagulation Profile: No results for input(s): INR, PROTIME in the last 168 hours. Cardiac Enzymes: No results for input(s): CKTOTAL, CKMB, CKMBINDEX, TROPONINI in the last 168 hours. BNP (last 3 results) No results for input(s): PROBNP in the last 8760 hours. HbA1C: No results for input(s): HGBA1C in the last 72 hours. CBG: No results for input(s): GLUCAP in the last 168 hours. Lipid Profile: No results for input(s): CHOL, HDL, LDLCALC, TRIG, CHOLHDL, LDLDIRECT in the last 72 hours. Thyroid Function Tests: No results for input(s): TSH, T4TOTAL, FREET4, T3FREE, THYROIDAB in the last 72 hours. Anemia Panel: No results for input(s): VITAMINB12, FOLATE, FERRITIN, TIBC, IRON, RETICCTPCT in the last 72 hours. Urine analysis:    Component Value Date/Time   COLORURINE AMBER (A) 03/15/2020 1024   APPEARANCEUR CLEAR (A) 03/15/2020 1024   APPEARANCEUR Clear 01/12/2013 1321   LABSPEC 1.025 03/15/2020 1024   LABSPEC 1.011 01/12/2013 1321   PHURINE 5.0 03/15/2020 1024   GLUCOSEU NEGATIVE 03/15/2020 1024   GLUCOSEU Negative 01/12/2013 1321   HGBUR  NEGATIVE 03/15/2020 1024   BILIRUBINUR NEGATIVE 03/15/2020 1024   BILIRUBINUR Negative 01/12/2013 1321   KETONESUR NEGATIVE 03/15/2020 1024   PROTEINUR NEGATIVE 03/15/2020 1024   NITRITE POSITIVE (A) 03/15/2020 1024   LEUKOCYTESUR NEGATIVE 03/15/2020 1024   LEUKOCYTESUR Negative 01/12/2013 1321   Sepsis Labs: _0 (procalcitonin:4,lacticidven:4) ) Recent Results (from the past 240 hour(s))  SARS Coronavirus 2 by RT PCR (hospital order, performed in [place] hospital lab) Nasopharyngeal Nasopharyngeal Swab     Status: None   Collection Time: 03/15/20  9:41 AM   Specimen: Nasopharyngeal Swab  Result Value Ref Range Status   SARS Coronavirus 2 NEGATIVE NEGATIVE Final    Comment: (NOTE) SARS-CoV-2 target nucleic acids are NOT DETECTED.  The SARS-CoV-2 RNA is generally detectable in upper and lower respiratory specimens during the acute phase of infection. The lowest concentration of SARS-CoV-2 viral copies this assay can detect is 250 copies / mL. A negative result does not preclude SARS-CoV-2 infection and should not be used as the sole basis for treatment or other patient management decisions.  A negative result may occur with improper specimen collection / handling, submission of specimen other than nasopharyngeal swab, presence of viral mutation(s) within the areas targeted by this assay, and inadequate number of viral copies (<250 copies / mL). A negative result must be combined with clinical observations, patient history, and epidemiological information.  Fact Sheet for Patients:   StrictlyIdeas.no  Fact Sheet for Healthcare Providers: BankingDealers.co.za  This test is not yet approved or  cleared by the [place] FDA and has been authorized for detection and/or diagnosis of SARS-CoV-2 by FDA under an Emergency Use Authorization (EUA).  This EUA will remain in effect (meaning this test can be used) for the duration  of the COVID-19 declaration under Section 564(b)(1) of the Act, 21 U.S.C. section 360bbb-3(b)(1), unless the authorization is terminated or revoked sooner.  Performed at [facility], [address] [place], [place] [address]      Radiological Exams on Admission: CT Head Wo Contrast  Result  Date: 03/15/2020 CLINICAL DATA:  Head trauma, headache. Additional history provided: Weakness and urinary retention for 4 days. EXAM: CT HEAD WITHOUT CONTRAST TECHNIQUE: Contiguous axial images were obtained from the base of the skull through the vertex without intravenous contrast. COMPARISON:  Prior head CT examination 02/23/2018 and earlier FINDINGS: Brain: Stable, mild-to-moderate generalized parenchymal atrophy. Stable, minimal ill-defined hypoattenuation within the cerebral white matter is nonspecific, but consistent with chronic small vessel ischemic disease. There is no acute intracranial hemorrhage. No demarcated cortical infarct. No extra-axial fluid collection. No evidence of intracranial mass. No midline shift. Redemonstrated nonspecific prominent left frontal dural calcification. Vascular: No hyperdense vessel.  Atherosclerotic calcifications Skull: Normal. Negative for fracture or focal lesion. Sinuses/Orbits: Left scleral buckle. Visualized orbits show no acute finding. Mild ethmoid, left sphenoid and right maxillary sinus mucosal thickening at the imaged levels. No significant mastoid effusion. IMPRESSION: No CT evidence of acute intracranial abnormality. Stable, mild-to-moderate generalized parenchymal atrophy and mild chronic small vessel ischemic disease. Mild paranasal sinus mucosal thickening at the imaged levels. Electronically Signed   By: Kellie Simmering DO   On: 03/15/2020 10:39   CT ABDOMEN PELVIS W CONTRAST  Result Date: 03/15/2020 CLINICAL DATA:  Abdominal pain, perirectal abscess suspected with swelling. EXAM: CT ABDOMEN AND PELVIS WITH CONTRAST TECHNIQUE: Multidetector CT  imaging of the abdomen and pelvis was performed using the standard protocol following bolus administration of intravenous contrast. CONTRAST:  13m OMNIPAQUE IOHEXOL 300 MG/ML  SOLN COMPARISON:  05/14/2018 FINDINGS: Lower chest: Small RIGHT-sided pleural effusion is new from the previous exam. Associated with mild basilar volume loss. Signs of RIGHT coronary intervention. Heart size mildly enlarged.  No pericardial fluid. Hepatobiliary: Hepatic steatosis. Portal vein is patent. Post cholecystectomy. No biliary duct dilation. Pancreas: Pancreatic atrophy. No peripancreatic inflammation or duct dilation. Spleen: Spleen normal size and contour. Adrenals/Urinary Tract: Adrenal glands are normal. Mild cortical scarring of the kidneys bilaterally without signs of hydronephrosis. Small low-density lesion in the upper pole the LEFT kidney likely a small cyst Small filling defect in the RIGHT renal pelvis versus contrast mixing. There is very subtle asymmetry of enhancement in this area as well best seen on sagittal image 59 of series 509. Question of filling defects seen on image 24 of delayed phase imaging in the axial plane. This areas incompletely opacified with limited assessment. Urinary bladder is markedly distended. Distal ureter select distal ureter distal ureteral evaluation hampered by susceptibility artifact from the patient's hip arthroplasty changes in the bilateral hips. Stomach/Bowel: Marked distension of the rectum filling the pelvic outlet measuring approximately 8.2 x 8.1 cm. Perirectal stranding. Bowing of levator in I suggests pelvic floor dysfunction. Imaging does not extend entirely through the perineum. No abscess is visualized in the imaged portions of the abdomen and pelvis. Partially formed stool and liquid stool seen elsewhere in the colon. Mild distension of bowel loops in the RIGHT lower quadrant. Calcifications in the RIGHT lower quadrant mesentery with similar appearance to study from 2019.  Distal ileal mural stratification and narrowing best noted on image 51 of series 504 alternating areas of dilation and narrowing seen proximal to this, pattern is similar to previous imaging. Stomach is under distended. Vascular/Lymphatic: Calcified and noncalcified plaque in the abdominal aorta without aneurysmal dilation. No adenopathy in the retroperitoneum or in the upper abdomen. No pelvic lymphadenopathy. Reproductive: Nonspecific, unremarkable CT appearance of uterus and adnexa. Other: No ascites.  No abscess. Musculoskeletal: Cement augmentation of the L3 vertebral body. Compression fracture at T9 with a chronic appearance.  IMPRESSION: 1. Finding of stercoral colitis/proctitis with perianal inflammation and descent of pelvic floor structures. Anal orifice and perianal regions are incompletely imaged. 2. Contrast mixing versus small filling defect in the RIGHT renal pelvis. Follow-up with urine cytology and urologic evaluation on a nonemergent basis may be helpful with hematuria evaluation as warranted. 3. Chronic areas of small bowel narrowing and dilation in the setting of Crohn's disease. No signs of perienteric stranding. Mild acute on chronic changes are however considered given appearance. Areas of more proximal ileal distension are slightly increased but without frank evidence of obstruction. 4. Moderate to marked distension of the urinary bladder could be secondarily related to rectal distension but there is no current hydronephrosis. 5. Hepatic steatosis. 6. New small RIGHT effusion. 7. Aortic atherosclerosis. Aortic Atherosclerosis (ICD10-I70.0). Electronically Signed   By: Zetta Bills M.D.   On: 03/15/2020 11:08     EKG: Independently reviewed.  Sinus rhythm, QTC 502, low voltage, LAD, poor R wave progression, anteroseptal infarction pattern  Assessment/Plan Principal Problem:   Right arm cellulitis Active Problems:   GERD without esophagitis   Sepsis ([place])   Crohn disease ([facility])    Depression   HTN (hypertension)   Anal pain   Cellulitis of lower extremity   Sepsis due to right arm cellulitis and cellulitis of lower extremity bilaterally: Patient admits critical for sepsis with leukocytosis, tachycardia and tachypnea.  Lactic acid is normal.  Hemodynamically stable.  - Admitted to MedSurg bed as inpatient - Empiric antimicrobial treatment with Rocephin (patient received 1 dose of Ancef in ED) - PRN Zofran for nausea, tramadol for pain - Blood cultures x 2  - ESR and CRP - will get Procalcitonin - IVF: 1 L of LR and 1 L of normal saline, followed by 75 cc/h -Follow-up venous Doppler of right arm and bilateral lower extremity to rule out a DVT  GERD without esophagitis -Protonix  Crohn disease ([place]) -Continue budesonide  Depression -Tinea home medications: Cymbalta  HTN (hypertension) -Hold Hygroton since patient need IV fluids  Anal pain: CT scan showed possible stercoral colitis/proctitis. -As needed Zofran and tramadol -Patient is on Rocephin  Abnormal findings on CT scan: CT abdomen/pelvis that showed contrast mixing versus small filling defect in the RIGHT renal pelvis. -need to follow-up with urology as outpt   DVT ppx: SQ Lovenox Code Status: Full code Family Communication:   Yes, patient's daughter   at bed side Disposition Plan:  Anticipate discharge back to previous environment Consults called: None Admission status: Med-surg bed as inpt      Status is: Inpatient  Remains inpatient appropriate because:Inpatient level of care appropriate due to severity of illness.  Patient has some multiple comorbidities, now presents with cellulitis in both lower extremities and the right arm.  Patient also has sepsis.  Her presentation is highly complicated.  Patient is at high risk of deteriorating given his old age.  Patient will need to be treated in hospital for at least 2 days   Dispo: The patient is from: Home              Anticipated d/c is to:  Home              Anticipated d/c date is: 2 days              Patient currently is not medically stable to d/c.           Date of Service 03/15/2020    Ivor Costa [facility]  If 7PM-7AM, please contact night-coverage [email or web address] 03/15/2020, 5:24 PM

## 2020-03-15 NOTE — ED Notes (Signed)
Pt with large grey liquid bowel movement. Pt changed by this RN and Genia Hotter, pt able to assist with rolling. New linens provided and pt cleaned with bath wipes.

## 2020-03-15 NOTE — Progress Notes (Signed)
Skin assessment with Erline Levine Rn cellulitis to right arm and bilateral lower extremities arm and legs weep and red. Abrasions bilateral legs. Bruise posterior left back abrasion to left thigh. Dry flaky skin to bilateral feet Sacrum red unopened  hemorrhoids present

## 2020-03-15 NOTE — Plan of Care (Signed)
Pt came to unit via bed no distress noted on 2l o2 via n/c pt confused x 2 to person and situation, struggled with date and place but did answer place after telling her the hospital name she knew she was in [place]. C/o feet pain 7/10 says hurts more at night.  daughter cindy accompanies and helps with assessment questions.. Pt has decreased appetite x 2 weeks, pt has become gradually weaker over last few weeks per daughter, weeping of right arm and BLEs from cellulitis noted. Pt also has urinary retention per family foley in place amber urine in bag. Pt was non-ambulatory before admission several days ago was able to use [facility] Piacentini to toilet but unable now. Pt has diarrhea episodes however has chron's disease. Pt has hemorrhoids that cause her pain, Daughter would like Dr Marlyn Corporal to see pt while her if possible.  Call bell within reach bed low position bed alarm on for safety.

## 2020-03-15 NOTE — ED Notes (Signed)
Pt had large liquid BM in bed. Pt cleaned of stool and wiped down with bath wipes. New gown and brief placed.

## 2020-03-15 NOTE — ED Provider Notes (Addendum)
[facility] Emergency Department Provider Note  ___________________________________________   First MD Initiated Contact with Patient 03/15/20 [phone]     (approximate)  I have reviewed the triage vital signs and the nursing notes.   HISTORY  Chief Complaint Weakness   HPI [name] is a 86 y.o. female   Hx lymphedema, HTN and Crohn's disease.   Reports living at home alone, but with a live-in caregiver.   She reports waking up this AM "hurting all over," and feeling nauseous. No vomiting or frontal abdominal pain, but reports her pain is localized to her right forearm and peri-anal area. She reports it feels like she has hemorrhoids "flared up." Reports right forearm pain with hand movement and elbow flexion. Prefers to lay on her side due to anal pain.  Similarly, due to the pain, reports inability to void for "a couple days."  Denies documented fevers, syncope, chest pain.    Past Medical History:  Diagnosis Date  . Anemia, unspecified    B12 and iron deficiency   . Arthritis   . B12 deficiency   . Collagen vascular disease ([place])   . Crohn disease ([place])   . Crohn's disease ([place])    followed by Dr. Vira Agar with serial colonoscopies  . Dyspnea   . Fuchs' endothelial dystrophy   . GERD (gastroesophageal reflux disease)   . History of bone density study 09/01/2012  . History of spinal fracture   . Hyperlipidemia, unspecified   . Hypertension   . Hypokalemia   . Hypomagnesemia   . Osteoporosis   . Other dysphagia 01/24/2017  . Pneumonia    1980's  . Protein calorie malnutrition ([place])   . Retinal detachment   . Right shoulder injury 05/2002   treated conservatively  . Trigger finger   . Zenker diverticulum 09/25/2016    Patient Active Problem List   Diagnosis Date Noted  . Right arm cellulitis 03/15/2020  . Sepsis ([place]) 03/15/2020  . Cellulitis of lower extremity 03/15/2020  . Crohn disease ([place])   . Depression   . HTN  (hypertension)   . Anal pain   . Swelling of limb 04/22/2019  . Lymphedema 04/22/2019  . Peripheral edema 04/07/2019  . Hypomagnesemia 03/05/2019  . Arthritis of right knee 09/03/2018  . GERD without esophagitis 08/17/2018  . Spinal stenosis of lumbar region with radiculopathy 08/17/2018  . Status post kyphoplasty 08/17/2018  . Vertebral compression fracture ([place]) 08/11/2018  . Avascular necrosis of bone of hip, left ([place]) 01/06/2018  . Chronic anemia 12/23/2017  . Crohn's disease ([place]) 12/23/2017  . Fuchs' corneal dystrophy 12/23/2017  . Hyperlipidemia, unspecified 12/23/2017  . Status post total replacement of hip 12/23/2017  . Benign essential HTN 12/16/2017  . Dyspnea on exertion 12/02/2017  . Protein-calorie malnutrition ([place]) 04/21/2017  . Age-related osteoporosis with current pathological fracture with routine healing 04/03/2017  . Other dysphagia 01/24/2017  . Hypokalemia 01/22/2017  . Zenker diverticulum 09/25/2016  . History of hypertension 05/03/2015  . Hyponatremia 02/21/2015  . Nuclear sclerotic cataract of right eye 09/08/2012  . Right shoulder injury 05/27/2002    Past Surgical History:  Procedure Laterality Date  . APPENDECTOMY  1949  . CATARACT EXTRACTION EXTRACAPSULAR Right 05/10/2015   Procedure: EXTRACTION CATARACT EXTRACAPSULAR W/INSERTION INTRAOCULAR PROSTHESIS; Surgeon: Doyce Para, MD; [facility]; Service: Ophthalmology; Laterality: Right;  . CHOLECYSTECTOMY  1992  . COLONOSCOPY     03/24/1992, 03/11/1998, 07/08/2002, 05/08/2006, 01/31/2012   PH Crohns disease; no repeat due  to age per RTE (dw)  . CORNEAL TRANSPLANT Left 07/08/2012   Procedure: CORNEAL TRANSPLANT DSAEK ** tissue ordered OSI 06/27/2012; Surgeon: Doyce Para, MD; Location: [place]; Service: Ophthalmology;  . ESOPHAGOGASTRODUODENOSCOPY     05/01/2005, 01/31/2012 ; No repeat per RTE  . ESOPHAGOGASTRODUODENOSCOPY (EGD) WITH PROPOFOL N/A 05/30/2018   Procedure:  ESOPHAGOGASTRODUODENOSCOPY (EGD) WITH PROPOFOL;  Surgeon: Manya Silvas, MD;  Location: [facility] ENDOSCOPY;  Service: Endoscopy;  Laterality: N/A;  . EYE SURGERY  2012, 2013   5 total  . JOINT REPLACEMENT Right 01-28-13  . KYPHOPLASTY N/A 08/12/2018   Procedure: KYPHOPLASTY L3;  Surgeon: Hessie Knows, MD;  Location: [facility] ORS;  Service: Orthopedics;  Laterality: N/A;  . LENSECTOMY PHACOFRAGMENTATION WITH ASPIRATION Left 07/25/2011  . TONSILLECTOMY  1950  . TOTAL HIP ARTHROPLASTY Left 12/23/2017   Procedure: TOTAL HIP ARTHROPLASTY;  Surgeon: Dereck Leep, MD;  Location: [facility] ORS;  Service: Orthopedics;  Laterality: Left;  Marland Kitchen VITREOUS RETINAL SURGERY Left 11/07/2011   PPV/SO  . VITREOUS RETINAL SURGERY Left 04/18/2011   TPPV/TRP/SO/SB/EL/C3F8    Prior to Admission medications   Medication Sig Start Date End Date Taking? Authorizing Provider  acetaminophen (TYLENOL) 500 MG tablet Take 500 mg by mouth every 8 (eight) hours as needed for mild pain or moderate pain.    Yes [provider]  ascorbic acid (VITAMIN C) 500 MG tablet Take 500 mg by mouth daily.   Yes [provider]  aspirin EC 81 MG tablet Take 81 mg by mouth daily. Swallow whole.   Yes [provider]  budesonide (ENTOCORT EC) 3 MG 24 hr capsule Take 9 mg by mouth daily.    Yes [provider]  calcium-vitamin D (OSCAL WITH D) 500-200 MG-UNIT TABS tablet Take 1 tablet by mouth 3 (three) times daily.    Yes [provider]  chlorthalidone (HYGROTON) 25 MG tablet Take 25 mg by mouth daily. 02/10/20  Yes [provider]  cholecalciferol (VITAMIN D) 1000 units tablet Take 3 tablets by mouth once daily   Yes [provider]  Cyanocobalamin (VITAMIN B-12) 5000 MCG LOZG Subl Place 1 tablet under the tongue once dailY   Yes [provider]  DULoxetine (CYMBALTA) 20 MG capsule Take 20 mg by mouth daily.   Yes [provider]  gabapentin (NEURONTIN) 300 MG capsule  TAKE 1 CAPSULE BY MOUTH TWICE DAILY AND 2 CAPSULES NIGHTLY AS DIRECTED   Yes [provider]  hydrocortisone (ANUSOL-HC) 2.5 % rectal cream Apply topically 3 (three) times daily. 03/12/20  Yes [provider]  lidocaine (LIDODERM) 5 % Apply topically to right knee in the AM and Remove & Discard patch at night within 12 hours or as directed by MD for uncontrolled knee pain  08/23/18  Yes [provider]  loperamide (LOPERAMIDE A-D) 2 MG tablet Take 2 mg by mouth 3 (three) times daily.  08/14/18  Yes [provider]  magnesium oxide (MAG-OX) 400 (241.3 Mg) MG tablet Take 400 mg by mouth daily.   Yes [provider]  Multiple Vitamin (MULTIVITAMIN) tablet Take 1 tablet by mouth daily.   Yes [provider]  NEXIUM 40 MG capsule Take 40 mg by mouth daily.  08/26/13  Yes [provider]  potassium chloride SA (K-DUR,KLOR-CON) 20 MEQ tablet Take 20 mEq by mouth 2 (two) times daily.   Yes [provider]  prednisoLONE acetate (PRED FORTE) 1 % ophthalmic suspension Place 1 drop into both eyes at bedtime.  Yes [provider]  zinc sulfate (ZINC-220) 220 (50 Zn) MG capsule Take 220 mg by mouth daily.   Yes [provider]    Allergies Oxycodone  Family History  Problem Relation Age of Onset  . Hypertension Mother   . Stroke Father   . Hypertension Father   . Arthritis Other   . Prostate cancer Other   . Psoriasis Other   . Stroke Other   . Thyroid disease Other   . Anesthesia problems Neg Hx   . Diabetes Neg Hx   . Glaucoma Neg Hx   . Macular degeneration Neg Hx     Social History Social History   Tobacco Use  . Smoking status: Former Smoker    Packs/day: 0.50    Years: 35.00    Pack years: 17.50    Types: Cigarettes    Quit date: 01/29/1991    Years since quitting: 29.1  . Smokeless tobacco: Never Used  Vaping Use  . Vaping Use: Never used  Substance Use Topics  . Alcohol use: Yes     Comment: 3x/yr  . Drug use: No    Review of Systems  Constitutional: No fever/chills Eyes: No visual changes. ENT: No sore throat. Cardiovascular: Denies chest pain. Respiratory: Denies shortness of breath. Gastrointestinal: No abdominal pain.  No vomiting.  No diarrhea.  No constipation. Positive for nausea and perianal pain.  Genitourinary: Negative for dysuria. Musculoskeletal: Negative for back pain. Skin: Positive for red rash to right forearm Neurological: Negative for headaches, focal weakness or numbness.  ____________________________________________   PHYSICAL EXAM:  VITAL SIGNS: ED Triage Vitals [03/15/20 0853]  Enc Vitals Group     BP      Pulse      Resp      Temp      Temp src      SpO2      Weight 172 lb (78 kg)     Height 5' (1.524 m)     Head Circumference      Peak Flow      Pain Score 10     Pain Loc      Pain Edu?      Excl. in [place]?     Constitutional: Alert and oriented. Laying on her right side and moaning in discomfort. Conversational in full sentences, oriented to person, place time and situation. Dry mucus membranes.  Eyes: Conjunctivae are normal. PERRL. EOMI. Head: Atraumatic. Nose: No congestion/rhinnorhea. Mouth/Throat: Mucous membranes are dry.  Oropharynx non-erythematous. Neck: No stridor. No TTP to midline neck. Cardiovascular: Tachy, regular rhythm. Grossly normal heart sounds.  Good peripheral circulation. Respiratory: Normal respiratory effort.  No retractions. Lungs CTAB. Gastrointestinal: Soft and nontender. No distention. No abdominal bruits. No CVA tenderness. 2 stacked diapers, dirty, taken down to reveal patient incontinent of grey stool. Peri-anal erythema, soft swelling and TTP is present. No induration or fluctuance. Digital rectal exam and disimpaction performed, demonstrates small hard stool ball within the rectum with copious soft gray stool behind this. Musculoskeletal: No lower extremity tenderness nor edema.  No  joint effusions. Neurologic:  Normal speech and language. No gross focal neurologic deficits are appreciated. No gait instability. Skin:  Skin is warm, dry and intact.  Right forearm has dorsal confluent erythema, diffuse swelling and TTP.  Psychiatric: Mood and affect are normal. Speech and behavior are normal.  ____________________________________________   LABS (all labs ordered are listed, but only abnormal results are displayed)  Labs Reviewed  BASIC METABOLIC PANEL -  Abnormal; Notable for the following components:      Result Value   Chloride 95 (*)    Glucose, Bld 123 (*)    Calcium 7.9 (*)    GFR calc non Af Amer 56 (*)    All other components within normal limits  CBC - Abnormal; Notable for the following components:   WBC 15.1 (*)    RBC 3.86 (*)    Hemoglobin 10.8 (*)    HCT 33.4 (*)    RDW 16.8 (*)    Platelets 488 (*)    All other components within normal limits  URINALYSIS, COMPLETE (UACMP) WITH MICROSCOPIC - Abnormal; Notable for the following components:   Color, Urine AMBER (*)    APPearance CLEAR (*)    Nitrite POSITIVE (*)    All other components within normal limits  BRAIN NATRIURETIC PEPTIDE - Abnormal; Notable for the following components:   B Natriuretic Peptide 573.7 (*)    All other components within normal limits  SARS CORONAVIRUS 2 BY RT PCR (HOSPITAL ORDER, [facility])  CULTURE, BLOOD (ROUTINE X 2)  CULTURE, BLOOD (ROUTINE X 2)  LACTIC ACID, PLASMA  LACTIC ACID, PLASMA  PROCALCITONIN  CBG MONITORING, ED   ____________________________________________  EKG Twelve-lead EKG with sinus rhythm, 105 bpm, normal axis, slightly prolonged QTC to 501 ms and no evidence of acute ischemia ____________________________________________  RADIOLOGY  ED MD interpretation: CT head without evidence of acute intracranial pathology, obtained due to concerns for altered mental status  Official radiology report(s): CT Head Wo  Contrast  Result Date: 03/15/2020 CLINICAL DATA:  Head trauma, headache. Additional history provided: Weakness and urinary retention for 4 days. EXAM: CT HEAD WITHOUT CONTRAST TECHNIQUE: Contiguous axial images were obtained from the base of the skull through the vertex without intravenous contrast. COMPARISON:  Prior head CT examination 02/23/2018 and earlier FINDINGS: Brain: Stable, mild-to-moderate generalized parenchymal atrophy. Stable, minimal ill-defined hypoattenuation within the cerebral white matter is nonspecific, but consistent with chronic small vessel ischemic disease. There is no acute intracranial hemorrhage. No demarcated cortical infarct. No extra-axial fluid collection. No evidence of intracranial mass. No midline shift. Redemonstrated nonspecific prominent left frontal dural calcification. Vascular: No hyperdense vessel.  Atherosclerotic calcifications Skull: Normal. Negative for fracture or focal lesion. Sinuses/Orbits: Left scleral buckle. Visualized orbits show no acute finding. Mild ethmoid, left sphenoid and right maxillary sinus mucosal thickening at the imaged levels. No significant mastoid effusion. IMPRESSION: No CT evidence of acute intracranial abnormality. Stable, mild-to-moderate generalized parenchymal atrophy and mild chronic small vessel ischemic disease. Mild paranasal sinus mucosal thickening at the imaged levels. Electronically Signed   By: Kellie Simmering DO   On: 03/15/2020 10:39   CT ABDOMEN PELVIS W CONTRAST  Result Date: 03/15/2020 CLINICAL DATA:  Abdominal pain, perirectal abscess suspected with swelling. EXAM: CT ABDOMEN AND PELVIS WITH CONTRAST TECHNIQUE: Multidetector CT imaging of the abdomen and pelvis was performed using the standard protocol following bolus administration of intravenous contrast. CONTRAST:  178m OMNIPAQUE IOHEXOL 300 MG/ML  SOLN COMPARISON:  05/14/2018 FINDINGS: Lower chest: Small RIGHT-sided pleural effusion is new from the previous exam.  Associated with mild basilar volume loss. Signs of RIGHT coronary intervention. Heart size mildly enlarged.  No pericardial fluid. Hepatobiliary: Hepatic steatosis. Portal vein is patent. Post cholecystectomy. No biliary duct dilation. Pancreas: Pancreatic atrophy. No peripancreatic inflammation or duct dilation. Spleen: Spleen normal size and contour. Adrenals/Urinary Tract: Adrenal glands are normal. Mild cortical scarring of the kidneys bilaterally without signs of  hydronephrosis. Small low-density lesion in the upper pole the LEFT kidney likely a small cyst Small filling defect in the RIGHT renal pelvis versus contrast mixing. There is very subtle asymmetry of enhancement in this area as well best seen on sagittal image 59 of series 509. Question of filling defects seen on image 24 of delayed phase imaging in the axial plane. This areas incompletely opacified with limited assessment. Urinary bladder is markedly distended. Distal ureter select distal ureter distal ureteral evaluation hampered by susceptibility artifact from the patient's hip arthroplasty changes in the bilateral hips. Stomach/Bowel: Marked distension of the rectum filling the pelvic outlet measuring approximately 8.2 x 8.1 cm. Perirectal stranding. Bowing of levator in I suggests pelvic floor dysfunction. Imaging does not extend entirely through the perineum. No abscess is visualized in the imaged portions of the abdomen and pelvis. Partially formed stool and liquid stool seen elsewhere in the colon. Mild distension of bowel loops in the RIGHT lower quadrant. Calcifications in the RIGHT lower quadrant mesentery with similar appearance to study from 2019. Distal ileal mural stratification and narrowing best noted on image 51 of series 504 alternating areas of dilation and narrowing seen proximal to this, pattern is similar to previous imaging. Stomach is under distended. Vascular/Lymphatic: Calcified and noncalcified plaque in the abdominal aorta  without aneurysmal dilation. No adenopathy in the retroperitoneum or in the upper abdomen. No pelvic lymphadenopathy. Reproductive: Nonspecific, unremarkable CT appearance of uterus and adnexa. Other: No ascites.  No abscess. Musculoskeletal: Cement augmentation of the L3 vertebral body. Compression fracture at T9 with a chronic appearance. IMPRESSION: 1. Finding of stercoral colitis/proctitis with perianal inflammation and descent of pelvic floor structures. Anal orifice and perianal regions are incompletely imaged. 2. Contrast mixing versus small filling defect in the RIGHT renal pelvis. Follow-up with urine cytology and urologic evaluation on a nonemergent basis may be helpful with hematuria evaluation as warranted. 3. Chronic areas of small bowel narrowing and dilation in the setting of Crohn's disease. No signs of perienteric stranding. Mild acute on chronic changes are however considered given appearance. Areas of more proximal ileal distension are slightly increased but without frank evidence of obstruction. 4. Moderate to marked distension of the urinary bladder could be secondarily related to rectal distension but there is no current hydronephrosis. 5. Hepatic steatosis. 6. New small RIGHT effusion. 7. Aortic atherosclerosis. Aortic Atherosclerosis (ICD10-I70.0). Electronically Signed   By: Zetta Bills M.D.   On: 03/15/2020 11:08    ____________________________________________   PROCEDURES   ____________________________________________   INITIAL IMPRESSION / ASSESSMENT AND PLAN / ED COURSE  86 year old woman with history of Crohn's disease presenting with right arm and perianal pain, consistent with acute cellulitis and stool impaction requiring medical admission.  Hemodynamically stable.  Exam demonstrates an uncomfortable-appearing woman with cellulitic appearance to her right dorsal forearm, and with impacted stool.  This was disimpacted at the bedside, yielding large volume of soft gray  stool, as documented by my nursing colleagues.  Due to concern for possible perianal/perirectal abscess CT imaging was performed and does not demonstrate this, but is concerning for stercoral colitis as documented above.  Start the patient on Ancef to cover cellulitis for her arm and will admit the patient to hospitalist for further work-up and management.  Furthermore, acute urinary retention in the emergency department, likely exacerbated by her stool retention.  Patient unable to void and bladder scan demonstrating 700 cc of retained urine, and so Foley catheter was placed to decompress the bladder and assist and  preventing UTI in the setting of her loose stools /poor mobility.  Clinical Course as of Mar 16 1627  Tue Mar 15, 2020  1039 Daughter at the bedside.  I reassessed...   [DS]  1059 Updated daughter on workup concerning for cellulitis and acute urinary retention. Waiting on CT   [DS]  8350 CT reviewed. Hospitalist paged   [DS]    Clinical Course User Index [DS] Vladimir Crofts, MD     ___________________________________________   FINAL CLINICAL IMPRESSION(S) / ED DIAGNOSES  Final diagnoses:  Generalized weakness  Urinary retention  Sinus tachycardia  Cellulitis of right upper extremity     ED Discharge Orders    None       Note:  This document was prepared using Dragon voice recognition software and may include unintentional dictation errors.    Vladimir Crofts, MD 03/15/20 1635    Vladimir Crofts, MD 03/15/20 [phone]

## 2020-03-15 NOTE — ED Notes (Signed)
Attempted to call report to [facility], per secretary the RN taking the pt will call me back

## 2020-03-15 NOTE — ED Triage Notes (Signed)
Pt here from home via [facility] with weakness and urinary retention x4 days. Lower extremeties leaking fluid per family. Vitals WNL. Pt altered at times which is not normal per family.

## 2020-03-16 ENCOUNTER — Inpatient Hospital Stay: Payer: Medicare Other

## 2020-03-16 DIAGNOSIS — R531 Weakness: Secondary | ICD-10-CM

## 2020-03-16 DIAGNOSIS — K5090 Crohn's disease, unspecified, without complications: Secondary | ICD-10-CM

## 2020-03-16 DIAGNOSIS — R339 Retention of urine, unspecified: Secondary | ICD-10-CM | POA: Diagnosis not present

## 2020-03-16 DIAGNOSIS — K6289 Other specified diseases of anus and rectum: Secondary | ICD-10-CM | POA: Diagnosis not present

## 2020-03-16 DIAGNOSIS — L03113 Cellulitis of right upper limb: Secondary | ICD-10-CM | POA: Diagnosis not present

## 2020-03-16 LAB — BASIC METABOLIC PANEL
Anion gap: 8 (ref 5–15)
BUN: 23 mg/dL (ref 8–23)
CO2: 30 mmol/L (ref 22–32)
Calcium: 7.6 mg/dL — ABNORMAL LOW (ref 8.9–10.3)
Chloride: 97 mmol/L — ABNORMAL LOW (ref 98–111)
Creatinine, Ser: 0.82 mg/dL (ref 0.44–1.00)
GFR calc Af Amer: >60 mL/min (ref >60)
GFR calc non Af Amer: >60 mL/min (ref >60)
Glucose, Bld: 102 mg/dL — ABNORMAL HIGH (ref 70–99)
Potassium: 3.6 mmol/L (ref 3.5–5.1)
Sodium: 135 mmol/L (ref 135–145)

## 2020-03-16 LAB — CBC
HCT: 27.7 % — ABNORMAL LOW (ref 36.0–46.0)
Hemoglobin: 9.1 g/dL — ABNORMAL LOW (ref 12.0–15.0)
MCH: 28.2 pg (ref 26.0–34.0)
MCHC: 32.9 g/dL (ref 30.0–36.0)
MCV: 85.8 fL (ref 80.0–100.0)
Platelets: 419 10*3/uL — ABNORMAL HIGH (ref 150–400)
RBC: 3.23 MIL/uL — ABNORMAL LOW (ref 3.87–5.11)
RDW: 16.5 % — ABNORMAL HIGH (ref 11.5–15.5)
WBC: 14.9 10*3/uL — ABNORMAL HIGH (ref 4.0–10.5)
nRBC: 0 % (ref 0.0–0.2)

## 2020-03-16 LAB — C DIFFICILE QUICK SCREEN W PCR REFLEX
C Diff antigen: NEGATIVE
C Diff interpretation: NOT DETECTED
C Diff toxin: NEGATIVE

## 2020-03-16 LAB — CK: Total CK: 119 U/L (ref 38–234)

## 2020-03-16 LAB — C-REACTIVE PROTEIN: CRP: 25.3 mg/dL — ABNORMAL HIGH (ref <1.0)

## 2020-03-16 MED ORDER — HYDROCORTISONE ACETATE 25 MG RE SUPP
25.0000 mg | Freq: Two times a day (BID) | RECTAL | Status: DC
  Administered 2020-03-16 – 2020-03-24 (×15): 25 mg via RECTAL
  Filled 2020-03-16 (×20): qty 1

## 2020-03-16 MED ORDER — ZINC OXIDE 40 % EX OINT
TOPICAL_OINTMENT | Freq: Two times a day (BID) | CUTANEOUS | Status: DC
  Administered 2020-03-22: 1 via TOPICAL
  Filled 2020-03-16 (×3): qty 113

## 2020-03-16 MED ORDER — ZINC OXIDE 40 % EX OINT
TOPICAL_OINTMENT | CUTANEOUS | Status: DC | PRN
  Filled 2020-03-16 (×2): qty 113

## 2020-03-16 NOTE — Evaluation (Signed)
Physical Therapy Evaluation Patient Details Name: Joanne [name] MRN: [id number] DOB: Aug 15, 1931 Today's Date: 03/16/2020   History of Present Illness  presented to ER secondary to pain, redness to R UE, bilat LEs; admitted for management of sepsis related to R UE, bilat LE cellulitis.  Clinical Impression  Upon evaluation, patient alert and oriented to self, location and general situation; follows commands and cooperates well with session. Intermittent confusion evident.  Bilat UE/LE generally weak and deconditioned; moderate edema (and weeping) noted throughout.  Currently requiring max assist +1-2 for bed mobility; close sup for unsupported sitting balance; mod assist +2 for sit/stand, static standing balance with RW.  Heavy WBing bilat UEs with forward trunk flexion that worsens with fatigue.  Unable to tolerate/complete stepping or further gait at this time due to LE pain and weakness in standing.  Patient hopeful for transfer to chair next session (pending tolerates pressure through buttocks, significant hemorrhoids). Will continue to assess/progress as medically appropriate. Would benefit from skilled PT to address above deficits and promote optimal return to PLOF.; recommend transition to STR upon discharge from acute hospitalization.     Follow Up Recommendations SNF    Equipment Recommendations       Recommendations for Other Services       Precautions / Restrictions Precautions Precautions: Fall Restrictions Weight Bearing Restrictions: No      Mobility  Bed Mobility Overal bed mobility: Needs Assistance Bed Mobility: Supine to Sit;Sit to Supine     Supine to sit: Mod assist;Max assist Sit to supine: Max assist;+2 for physical assistance      Transfers Overall transfer level: Needs assistance Equipment used: Rolling walker (2 wheeled) Transfers: Sit to/from Stand Sit to Stand: Mod assist;+2 physical assistance         General transfer comment: assist for  lift off, standing balance; consistent cuing/assist for postural extension  Ambulation/Gait             General Gait Details: unable to complete stepping  Stairs            Wheelchair Mobility    Modified Rankin (Stroke Patients Only)       Balance Overall balance assessment: Needs assistance Sitting-balance support: No upper extremity supported;Feet supported Sitting balance-Leahy Scale: Fair     Standing balance support: Bilateral upper extremity supported Standing balance-Leahy Scale: Poor                               Pertinent Vitals/Pain Pain Assessment: Faces Faces Pain Scale: Hurts even more Pain Location: R UE Pain Descriptors / Indicators: Guarding;Grimacing Pain Intervention(s): Limited activity within patient's tolerance;Monitored during session;Repositioned    Home Living Family/patient expects to be discharged to:: Private residence   Available Help at Discharge: Available 24 hours/day;Personal care attendant Type of Home: House Home Access: Stairs to enter Entrance Stairs-Rails: None Entrance Stairs-Number of Steps: 1 Home Layout: Two level;Able to live on main level with bedroom/bathroom Home Equipment: Walker - 4 wheels      Prior Function Level of Independence: Needs assistance         Comments: Sup/mod indep for transfers and limited household distances with 4WRW; does endorse multiple fall history (at least 3-4 in previous six months).  Caregiver/family assist with household responsibilities and ADLs as needed.     Hand Dominance        Extremity/Trunk Assessment   Upper Extremity Assessment Upper Extremity Assessment: Generalized weakness (R UE grossly  3-/5, L UE grossly 4-/5.  Moderate edema bilat; mild weeping R UE)    Lower Extremity Assessment Lower Extremity Assessment: Generalized weakness (grossly 3-/5 throughout)       Communication   Communication: No difficulties  Cognition Arousal/Alertness:  Awake/alert Behavior During Therapy: WFL for tasks assessed/performed                                   General Comments: oriented to self, location and general situation; follows simple commands; intermittent confusion evident (daughter reports patient 'seeing things' previously)      General Comments      Exercises Other Exercises Other Exercises: Supine bilat UE/LE therex, 1x5, act assist ROM-generally weak and deconditioned; limited, in part, by generalized edema Other Exercises: Unsupported sitting edge of bed, close sup; forward sitting reach approx 4-5".  Progressive trunk flexion, shoulder rounding with fatigue. Other Exercises: Bilat heels positioned/propped on towel rolls in supine for pressure relief (reporting heels sore) and bilat UEs positioned/elevated to promote edema control.  Educated/encouraged for bilat UE grasp/release to assist with edema management.  Patient/daughter voiced understanding of all information.   Assessment/Plan    PT Assessment Patient needs continued PT services  PT Problem List Decreased strength;Decreased range of motion;Decreased activity tolerance;Decreased balance;Decreased mobility;Decreased coordination;Decreased cognition;Decreased knowledge of use of DME;Decreased safety awareness;Decreased knowledge of precautions;Cardiopulmonary status limiting activity;Impaired sensation;Decreased skin integrity;Pain       PT Treatment Interventions DME instruction;Gait training;Functional mobility training;Therapeutic activities;Stair training;Therapeutic exercise;Cognitive remediation;Patient/family education;Balance training    PT Goals (Current goals can be found in the Care Plan section)  Acute Rehab PT Goals Patient Stated Goal: to get stronger PT Goal Formulation: With patient/family Time For Goal Achievement: 03/30/20 Potential to Achieve Goals: Good    Frequency Min 2X/week   Barriers to discharge Decreased caregiver  support      Co-evaluation               AM-PAC PT "6 Clicks" Mobility  Outcome Measure Help needed turning from your back to your side while in a flat bed without using bedrails?: A Lot Help needed moving from lying on your back to sitting on the side of a flat bed without using bedrails?: Total Help needed moving to and from a bed to a chair (including a wheelchair)?: Total Help needed standing up from a chair using your arms (e.g., wheelchair or bedside chair)?: A Lot Help needed to walk in hospital room?: Total Help needed climbing 3-5 steps with a railing? : Total 6 Click Score: 8    End of Session Equipment Utilized During Treatment: Gait belt Activity Tolerance: Patient tolerated treatment well Patient left: in bed;with call bell/phone within reach;with bed alarm set Nurse Communication: Mobility status PT Visit Diagnosis: Muscle weakness (generalized) (M62.81);Difficulty in walking, not elsewhere classified (R26.2);Pain Pain - Right/Left: Right Pain - part of body: Arm    Time: 1422-1506 PT Time Calculation (min) (ACUTE ONLY): 44 min   Charges:   PT Evaluation $PT Eval Moderate Complexity: 1 Mod PT Treatments $Therapeutic Exercise: 8-22 mins $Therapeutic Activity: 8-22 mins        Ralph Benavidez H. Owens Shark, PT, DPT, NCS 03/16/20, 3:33 PM [phone]

## 2020-03-16 NOTE — Progress Notes (Signed)
Patient ID: Joanne [name], female   DOB: [birth year], 86 y.o.   MRN: [id number] [facility] PROGRESS NOTE  Joanne [name] PNT:[id number] DOB: August 12, 1931 DOA: 03/15/2020 PCP: Tracie Harrier, MD  HPI/Subjective: Patient feels horrible.  Has not been feeling good for the last 2-3 nights.  Feeling very weak.  Having problems with hemorrhoids.  Having falls at home.  Daughter at the bedside's's mental status has been off and she has been seeing things and talking out of her head.  Objective: Vitals:   03/16/20 0010 03/16/20 0437  BP: 107/62 129/73  Pulse: (!) 102 (!) 57  Resp: 17   Temp: 97.9 F (36.6 C) 98 F (36.7 C)  SpO2: 100% 100%    Intake/Output Summary (Last 24 hours) at 03/16/2020 1249 Last data filed at 03/16/2020 1100 Gross per 24 hour  Intake 1100.27 ml  Output 450 ml  Net 650.27 ml   Filed Weights   03/15/20 0853  Weight: 78 kg    ROS: Review of Systems  Respiratory: Negative for shortness of breath.   Cardiovascular: Negative for chest pain.  Gastrointestinal: Negative for abdominal pain.  Musculoskeletal: Negative for joint pain.   Exam: Physical Exam HENT:     Nose: No mucosal edema.     Mouth/Throat:     Pharynx: No oropharyngeal exudate.  Eyes:     General: Lids are normal.     Conjunctiva/sclera: Conjunctivae normal.     Pupils: Pupils are equal, round, and reactive to light.  Cardiovascular:     Rate and Rhythm: Normal rate and regular rhythm.     Heart sounds: Normal heart sounds, S1 normal and S2 normal.  Pulmonary:     Effort: No respiratory distress.     Breath sounds: Examination of the right-lower field reveals decreased breath sounds. Examination of the left-lower field reveals decreased breath sounds. Decreased breath sounds present. No wheezing, rhonchi or rales.  Abdominal:     General: Bowel sounds are normal.     Palpations: Abdomen is soft.     Tenderness: There is no abdominal tenderness.  Musculoskeletal:     Right ankle:  Swelling present.     Left ankle: Swelling present.  Skin:    General: Skin is warm.     Nails: There is no clubbing.     Comments: Erythema right wrist, forearm and upper arm.  Bilateral erythema lower extremities  Neurological:     Mental Status: She is alert.     Comments: Able to move her extremities on her own and to command       Data Reviewed: Basic Metabolic Panel: Recent Labs  Lab 03/15/20 0857 03/16/20 0505  NA 135 135  K 3.7 3.6  CL 95* 97*  CO2 31 30  GLUCOSE 123* 102*  BUN 23 23  CREATININE 0.92 0.82  CALCIUM 7.9* 7.6*   CBC: Recent Labs  Lab 03/15/20 0857 03/16/20 0645  WBC 15.1* 14.9*  HGB 10.8* 9.1*  HCT 33.4* 27.7*  MCV 86.5 85.8  PLT 488* 419*   Cardiac Enzymes: Recent Labs  Lab 03/16/20 0505  CKTOTAL 119   BNP (last 3 results) Recent Labs    03/15/20 0857  BNP 573.7*      Recent Results (from the past 240 hour(s))  SARS Coronavirus 2 by RT PCR (hospital order, performed in [facility] hospital lab) Nasopharyngeal Nasopharyngeal Swab     Status: None   Collection Time: 03/15/20  9:41 AM   Specimen: Nasopharyngeal Swab  Result  Value Ref Range Status   SARS Coronavirus 2 NEGATIVE NEGATIVE Final    Comment: (NOTE) SARS-CoV-2 target nucleic acids are NOT DETECTED.  The SARS-CoV-2 RNA is generally detectable in upper and lower respiratory specimens during the acute phase of infection. The lowest concentration of SARS-CoV-2 viral copies this assay can detect is 250 copies / mL. A negative result does not preclude SARS-CoV-2 infection and should not be used as the sole basis for treatment or other patient management decisions.  A negative result may occur with improper specimen collection / handling, submission of specimen other than nasopharyngeal swab, presence of viral mutation(s) within the areas targeted by this assay, and inadequate number of viral copies (<250 copies / mL). A negative result must be combined with  clinical observations, patient history, and epidemiological information.  Fact Sheet for Patients:   StrictlyIdeas.no  Fact Sheet for Healthcare Providers: BankingDealers.co.za  This test is not yet approved or  cleared by the [place] FDA and has been authorized for detection and/or diagnosis of SARS-CoV-2 by FDA under an Emergency Use Authorization (EUA).  This EUA will remain in effect (meaning this test can be used) for the duration of the COVID-19 declaration under Section 564(b)(1) of the Act, 21 U.S.C. section 360bbb-3(b)(1), unless the authorization is terminated or revoked sooner.  Performed at [facility], [address] [place], [place] [address]   Culture, blood (x 2)     Status: None (Preliminary result)   Collection Time: 03/15/20  9:41 AM   Specimen: BLOOD  Result Value Ref Range Status   Specimen Description BLOOD RIGHT ANTECUBITAL  Final   Special Requests   Final    BOTTLES DRAWN AEROBIC AND ANAEROBIC Blood Culture results may not be optimal due to an excessive volume of blood received in culture bottles   Culture   Final    NO GROWTH < 24 HOURS Performed at [facility], [address] [place], [place] [address]    Report Status PENDING  Incomplete  Culture, blood (x 2)     Status: None (Preliminary result)   Collection Time: 03/15/20  9:41 AM   Specimen: BLOOD  Result Value Ref Range Status   Specimen Description BLOOD LEFT ANTECUBITAL  Final   Special Requests   Final    BOTTLES DRAWN AEROBIC AND ANAEROBIC Blood Culture results may not be optimal due to an excessive volume of blood received in culture bottles   Culture   Final    NO GROWTH < 24 HOURS Performed at [facility], [address], [place], [place] [address]    Report Status PENDING  Incomplete  C Difficile Quick Screen w PCR reflex     Status: None   Collection Time: 03/16/20 11:34 AM   Specimen: STOOL   Result Value Ref Range Status   C Diff antigen NEGATIVE NEGATIVE Final   C Diff toxin NEGATIVE NEGATIVE Final   C Diff interpretation No C. difficile detected.  Final    Comment: Performed at [facility], [address], [place],  [address]     Studies: CT Head Wo Contrast  Result Date: 03/15/2020 CLINICAL DATA:  Head trauma, headache. Additional history provided: Weakness and urinary retention for 4 days. EXAM: CT HEAD WITHOUT CONTRAST TECHNIQUE: Contiguous axial images were obtained from the base of the skull through the vertex without intravenous contrast. COMPARISON:  Prior head CT examination 02/23/2018 and earlier FINDINGS: Brain: Stable, mild-to-moderate generalized parenchymal atrophy. Stable, minimal ill-defined hypoattenuation within  the cerebral white matter is nonspecific, but consistent with chronic small vessel ischemic disease. There is no acute intracranial hemorrhage. No demarcated cortical infarct. No extra-axial fluid collection. No evidence of intracranial mass. No midline shift. Redemonstrated nonspecific prominent left frontal dural calcification. Vascular: No hyperdense vessel.  Atherosclerotic calcifications Skull: Normal. Negative for fracture or focal lesion. Sinuses/Orbits: Left scleral buckle. Visualized orbits show no acute finding. Mild ethmoid, left sphenoid and right maxillary sinus mucosal thickening at the imaged levels. No significant mastoid effusion. IMPRESSION: No CT evidence of acute intracranial abnormality. Stable, mild-to-moderate generalized parenchymal atrophy and mild chronic small vessel ischemic disease. Mild paranasal sinus mucosal thickening at the imaged levels. Electronically Signed   By: Kellie Simmering DO   On: 03/15/2020 10:39   CT ABDOMEN PELVIS W CONTRAST  Result Date: 03/15/2020 CLINICAL DATA:  Abdominal pain, perirectal abscess suspected with swelling. EXAM: CT ABDOMEN AND PELVIS WITH CONTRAST TECHNIQUE: Multidetector CT imaging  of the abdomen and pelvis was performed using the standard protocol following bolus administration of intravenous contrast. CONTRAST:  137m OMNIPAQUE IOHEXOL 300 MG/ML  SOLN COMPARISON:  05/14/2018 FINDINGS: Lower chest: Small RIGHT-sided pleural effusion is new from the previous exam. Associated with mild basilar volume loss. Signs of RIGHT coronary intervention. Heart size mildly enlarged.  No pericardial fluid. Hepatobiliary: Hepatic steatosis. Portal vein is patent. Post cholecystectomy. No biliary duct dilation. Pancreas: Pancreatic atrophy. No peripancreatic inflammation or duct dilation. Spleen: Spleen normal size and contour. Adrenals/Urinary Tract: Adrenal glands are normal. Mild cortical scarring of the kidneys bilaterally without signs of hydronephrosis. Small low-density lesion in the upper pole the LEFT kidney likely a small cyst Small filling defect in the RIGHT renal pelvis versus contrast mixing. There is very subtle asymmetry of enhancement in this area as well best seen on sagittal image 59 of series 509. Question of filling defects seen on image 24 of delayed phase imaging in the axial plane. This areas incompletely opacified with limited assessment. Urinary bladder is markedly distended. Distal ureter select distal ureter distal ureteral evaluation hampered by susceptibility artifact from the patient's hip arthroplasty changes in the bilateral hips. Stomach/Bowel: Marked distension of the rectum filling the pelvic outlet measuring approximately 8.2 x 8.1 cm. Perirectal stranding. Bowing of levator in I suggests pelvic floor dysfunction. Imaging does not extend entirely through the perineum. No abscess is visualized in the imaged portions of the abdomen and pelvis. Partially formed stool and liquid stool seen elsewhere in the colon. Mild distension of bowel loops in the RIGHT lower quadrant. Calcifications in the RIGHT lower quadrant mesentery with similar appearance to study from 2019. Distal  ileal mural stratification and narrowing best noted on image 51 of series 504 alternating areas of dilation and narrowing seen proximal to this, pattern is similar to previous imaging. Stomach is under distended. Vascular/Lymphatic: Calcified and noncalcified plaque in the abdominal aorta without aneurysmal dilation. No adenopathy in the retroperitoneum or in the upper abdomen. No pelvic lymphadenopathy. Reproductive: Nonspecific, unremarkable CT appearance of uterus and adnexa. Other: No ascites.  No abscess. Musculoskeletal: Cement augmentation of the L3 vertebral body. Compression fracture at T9 with a chronic appearance. IMPRESSION: 1. Finding of stercoral colitis/proctitis with perianal inflammation and descent of pelvic floor structures. Anal orifice and perianal regions are incompletely imaged. 2. Contrast mixing versus small filling defect in the RIGHT renal pelvis. Follow-up with urine cytology and urologic evaluation on a nonemergent basis may be helpful with hematuria evaluation as warranted. 3. Chronic areas of small bowel narrowing and  dilation in the setting of Crohn's disease. No signs of perienteric stranding. Mild acute on chronic changes are however considered given appearance. Areas of more proximal ileal distension are slightly increased but without frank evidence of obstruction. 4. Moderate to marked distension of the urinary bladder could be secondarily related to rectal distension but there is no current hydronephrosis. 5. Hepatic steatosis. 6. New small RIGHT effusion. 7. Aortic atherosclerosis. Aortic Atherosclerosis (ICD10-I70.0). Electronically Signed   By: Zetta Bills M.D.   On: 03/15/2020 11:08   US Venous Img Lower Bilateral (DVT)  Result Date: 03/16/2020 CLINICAL DATA:  86 year old female with a history of cellulitis EXAM: BILATERAL LOWER EXTREMITY VENOUS DOPPLER ULTRASOUND TECHNIQUE: Gray-scale sonography with graded compression, as well as color Doppler and duplex ultrasound  were performed to evaluate the lower extremity deep venous systems from the level of the common femoral vein and including the common femoral, femoral, profunda femoral, popliteal and calf veins including the posterior tibial, peroneal and gastrocnemius veins when visible. The superficial great saphenous vein was also interrogated. Spectral Doppler was utilized to evaluate flow at rest and with distal augmentation maneuvers in the common femoral, femoral and popliteal veins. COMPARISON:  None. FINDINGS: RIGHT LOWER EXTREMITY Common Femoral Vein: No evidence of thrombus. Normal compressibility, respiratory phasicity and response to augmentation. Saphenofemoral Junction: No evidence of thrombus. Normal compressibility and flow on color Doppler imaging. Profunda Femoral Vein: No evidence of thrombus. Normal compressibility and flow on color Doppler imaging. Femoral Vein: No evidence of thrombus. Normal compressibility, respiratory phasicity and response to augmentation. Popliteal Vein: No evidence of thrombus. Normal compressibility, respiratory phasicity and response to augmentation. Calf Veins: No evidence of thrombus. Normal compressibility and flow on color Doppler imaging. Superficial Great Saphenous Vein: No evidence of thrombus. Normal compressibility and flow on color Doppler imaging. Other Findings:  None. LEFT LOWER EXTREMITY Common Femoral Vein: No evidence of thrombus. Normal compressibility, respiratory phasicity and response to augmentation. Saphenofemoral Junction: No evidence of thrombus. Normal compressibility and flow on color Doppler imaging. Profunda Femoral Vein: No evidence of thrombus. Normal compressibility and flow on color Doppler imaging. Femoral Vein: No evidence of thrombus. Normal compressibility, respiratory phasicity and response to augmentation. Popliteal Vein: No evidence of thrombus. Normal compressibility, respiratory phasicity and response to augmentation. Calf Veins: No evidence of  thrombus. Normal compressibility and flow on color Doppler imaging. Superficial Great Saphenous Vein: No evidence of thrombus. Normal compressibility and flow on color Doppler imaging. Other Findings:  None. IMPRESSION: Sonographic survey of the bilateral lower extremities negative for DVT Electronically Signed   By: Corrie Mckusick D.O.   On: 03/16/2020 11:25   US Venous Img Upper Uni Right(DVT)  Result Date: 03/16/2020 CLINICAL DATA:  86 year old female with a history of cellulitis EXAM: RIGHT UPPER EXTREMITY VENOUS DOPPLER ULTRASOUND TECHNIQUE: Gray-scale sonography with graded compression, as well as color Doppler and duplex ultrasound were performed to evaluate the upper extremity deep venous system from the level of the subclavian vein and including the jugular, axillary, basilic, radial, ulnar and upper cephalic vein. Spectral Doppler was utilized to evaluate flow at rest and with distal augmentation maneuvers. COMPARISON:  None. FINDINGS: Contralateral Subclavian Vein: Respiratory phasicity is normal and symmetric with the symptomatic side. No evidence of thrombus. Normal compressibility. Internal Jugular Vein: No evidence of thrombus. Normal compressibility, respiratory phasicity and response to augmentation. Subclavian Vein: No evidence of thrombus. Normal compressibility, respiratory phasicity and response to augmentation. Axillary Vein: No evidence of thrombus. Normal compressibility, respiratory phasicity and response to  augmentation. Cephalic Vein: No evidence of thrombus. Normal compressibility, respiratory phasicity and response to augmentation. Basilic Vein: No evidence of thrombus. Normal compressibility, respiratory phasicity and response to augmentation. Brachial Veins: No evidence of thrombus. Normal compressibility, respiratory phasicity and response to augmentation. Radial Veins: No evidence of thrombus. Normal compressibility, respiratory phasicity and response to augmentation. Ulnar Veins:  No evidence of thrombus. Normal compressibility, respiratory phasicity and response to augmentation. Other Findings:  Edema IMPRESSION: Sonographic survey of the right upper extremity negative for DVT Edema Electronically Signed   By: Corrie Mckusick D.O.   On: 03/16/2020 11:26    Scheduled Meds: . ascorbic acid  500 mg Oral Daily  . aspirin EC  81 mg Oral Daily  . budesonide  9 mg Oral Daily  . calcium-vitamin D  1 tablet Oral TID  . Chlorhexidine Gluconate Cloth  6 each Topical Daily  . DULoxetine  20 mg Oral Daily  . enoxaparin (LOVENOX) injection  40 mg Subcutaneous Q24H  . gabapentin  300 mg Oral TID  . hydrocortisone   Topical TID  . hydrocortisone  25 mg Rectal BID  . lidocaine  1 patch Transdermal Q24H  . magnesium oxide  400 mg Oral Daily  . multivitamin with minerals   Oral Daily  . pantoprazole  40 mg Oral Daily  . prednisoLONE acetate  1 drop Both Eyes QHS  . zinc sulfate  220 mg Oral Daily   Continuous Infusions: . sodium chloride 75 mL/hr at 03/16/20 1100  . cefTRIAXone (ROCEPHIN)  IV Stopped (03/15/20 2210)  . sodium chloride      Assessment/Plan:  1. Clinical sepsis present on admission with acute metabolic encephalopathy, leukocytosis, tachypnea.  Admitting physician ordered Rocephin which would cover both cellulitis and stercoral colitis and proctitis.  Follow-up cultures. 2. Hemorrhoids.  Anusol suppositories and cream.  Family requested Dr. Tollie Pizza surgery to see. 3. Weakness.  Physical therapy evaluation. 4. GERD with out esophagitis on Protonix 5. Crohn's disease on budesonide 6. Depression on Cymbalta 7. Urinary retention had Foley placed in the emergency room secondary to retention.  Hopefully with removal of stool this will resolve on its own.  Keep Foley today.   Code Status:     Code Status Orders  (From admission, onward)         Start     Ordered   03/15/20 1525  Full code  Continuous        03/15/20 1527        Code Status History     Date Active Date Inactive Code Status Order ID Comments User Context   08/11/2018 1854 08/13/2018 2032 Full Code [id number]  Henreitta Leber, MD Inpatient   08/11/2018 1737 08/11/2018 1854 DNR [id number]  Saundra Shelling, MD Inpatient   08/11/2018 1735 08/11/2018 1736 Full Code [id number]  Saundra Shelling, MD Inpatient   12/23/2017 1137 12/25/2017 1808 Full Code [id number]  Dereck Leep, MD Inpatient   01/22/2017 1502 01/23/2017 1413 Full Code [id number]  Hillary Bow, MD ED   02/21/2015 1939 02/23/2015 1908 Full Code [id number]  Loletha Grayer, MD ED   Advance Care Planning Activity     Family Communication: Daughter at the bedside Disposition Plan: Status is: Inpatient  Dispo: The patient is from: Home              Anticipated d/c is to: Home versus rehab              Anticipated d/c date is: Likely will need a few  days with extensive cellulitis              Patient currently treated for extensive cellulitis of right arm and bilateral lower extremities.  Antibiotics:  Rocephin  Time spent: 28 minutes  [place]

## 2020-03-17 DIAGNOSIS — F329 Major depressive disorder, single episode, unspecified: Secondary | ICD-10-CM

## 2020-03-17 DIAGNOSIS — R531 Weakness: Secondary | ICD-10-CM | POA: Diagnosis not present

## 2020-03-17 DIAGNOSIS — A419 Sepsis, unspecified organism: Secondary | ICD-10-CM | POA: Diagnosis not present

## 2020-03-17 DIAGNOSIS — K649 Unspecified hemorrhoids: Secondary | ICD-10-CM | POA: Diagnosis not present

## 2020-03-17 DIAGNOSIS — G9340 Encephalopathy, unspecified: Secondary | ICD-10-CM

## 2020-03-17 DIAGNOSIS — L03113 Cellulitis of right upper limb: Secondary | ICD-10-CM | POA: Diagnosis not present

## 2020-03-17 DIAGNOSIS — R6520 Severe sepsis without septic shock: Secondary | ICD-10-CM

## 2020-03-17 DIAGNOSIS — K219 Gastro-esophageal reflux disease without esophagitis: Secondary | ICD-10-CM

## 2020-03-17 LAB — CBC
HCT: 28.8 % — ABNORMAL LOW (ref 36.0–46.0)
Hemoglobin: 9.9 g/dL — ABNORMAL LOW (ref 12.0–15.0)
MCH: 28.4 pg (ref 26.0–34.0)
MCHC: 34.4 g/dL (ref 30.0–36.0)
MCV: 82.5 fL (ref 80.0–100.0)
Platelets: 441 10*3/uL — ABNORMAL HIGH (ref 150–400)
RBC: 3.49 MIL/uL — ABNORMAL LOW (ref 3.87–5.11)
RDW: 16.5 % — ABNORMAL HIGH (ref 11.5–15.5)
WBC: 13.2 10*3/uL — ABNORMAL HIGH (ref 4.0–10.5)
nRBC: 0 % (ref 0.0–0.2)

## 2020-03-17 MED ORDER — VANCOMYCIN HCL 1500 MG/300ML IV SOLN
1500.0000 mg | Freq: Once | INTRAVENOUS | Status: AC
  Administered 2020-03-17: 1500 mg via INTRAVENOUS
  Filled 2020-03-17: qty 300

## 2020-03-17 MED ORDER — BETHANECHOL CHLORIDE 25 MG PO TABS
25.0000 mg | ORAL_TABLET | Freq: Once | ORAL | Status: AC
  Administered 2020-03-17: 25 mg via ORAL
  Filled 2020-03-17: qty 1

## 2020-03-17 MED ORDER — HYDROCORTISONE 1 % EX CREA
1.0000 "application " | TOPICAL_CREAM | Freq: Three times a day (TID) | CUTANEOUS | Status: DC
  Administered 2020-03-17 – 2020-03-23 (×20): 1 via TOPICAL
  Filled 2020-03-17: qty 28

## 2020-03-17 MED ORDER — VANCOMYCIN HCL 1250 MG/250ML IV SOLN
1250.0000 mg | INTRAVENOUS | Status: DC
  Filled 2020-03-17: qty 250

## 2020-03-17 NOTE — Consult Note (Addendum)
Reason for Consult:Hemorrhoids Referring Physician: Loletha Grayer, MD   Joanne [name] is an 86 y.o. female.  HPI: This 86 year old woman was scheduled for an office evaluation on March 15, 2020, but was unable to meet the appointment as she was admitted with weakness and cellulitis.  The patient has a long history of Crohn's disease, managed with Entocort p.o.  She rarely has hard stools.  About a month ago she became constipated and strained at stool.  Since then she has had intermittent and increasing perianal pain and intermittent bleeding.  She has made use of topical OTC medications with incomplete relief.  The patient has had several falls of late and it is unclear if this had precipitated the areas of cellulitis now evident.   The patient's daughter, Joanne [name] was present for the interview and exam, and her other daughter, Joanne [name] was on speaker phone after the exam.   Past Medical History:  Diagnosis Date  . Anemia, unspecified    B12 and iron deficiency   . Arthritis   . B12 deficiency   . Collagen vascular disease ([place])   . Crohn disease ([place])   . Crohn's disease ([place])    followed by Dr. Vira Agar with serial colonoscopies  . Dyspnea   . Fuchs' endothelial dystrophy   . GERD (gastroesophageal reflux disease)   . History of bone density study 09/01/2012  . History of spinal fracture   . Hyperlipidemia, unspecified   . Hypertension   . Hypokalemia   . Hypomagnesemia   . Osteoporosis   . Other dysphagia 01/24/2017  . Pneumonia    1980's  . Protein calorie malnutrition ([place])   . Retinal detachment   . Right shoulder injury 05/2002   treated conservatively  . Trigger finger   . Zenker diverticulum 09/25/2016    Past Surgical History:  Procedure Laterality Date  . APPENDECTOMY  1949  . CATARACT EXTRACTION EXTRACAPSULAR Right 05/10/2015   Procedure: EXTRACTION CATARACT EXTRACAPSULAR W/INSERTION INTRAOCULAR PROSTHESIS; Surgeon: Doyce Para, MD; [facility]; Service: Ophthalmology; Laterality: Right;  . CHOLECYSTECTOMY  1992  . COLONOSCOPY     03/24/1992, 03/11/1998, 07/08/2002, 05/08/2006, 01/31/2012   PH Crohns disease; no repeat due to age per RTE (dw)  . CORNEAL TRANSPLANT Left 07/08/2012   Procedure: CORNEAL TRANSPLANT DSAEK ** tissue ordered OSI 06/27/2012; Surgeon: Doyce Para, MD; Location: [facility]; Service: Ophthalmology;  . ESOPHAGOGASTRODUODENOSCOPY     05/01/2005, 01/31/2012 ; No repeat per RTE  . ESOPHAGOGASTRODUODENOSCOPY (EGD) WITH PROPOFOL N/A 05/30/2018   Procedure: ESOPHAGOGASTRODUODENOSCOPY (EGD) WITH PROPOFOL;  Surgeon: Manya Silvas, MD;  Location: [facility] ENDOSCOPY;  Service: Endoscopy;  Laterality: N/A;  . EYE SURGERY  2012, 2013   5 total  . JOINT REPLACEMENT Right 01-28-13  . KYPHOPLASTY N/A 08/12/2018   Procedure: KYPHOPLASTY L3;  Surgeon: Hessie Knows, MD;  Location: [facility] ORS;  Service: Orthopedics;  Laterality: N/A;  . LENSECTOMY PHACOFRAGMENTATION WITH ASPIRATION Left 07/25/2011  . TONSILLECTOMY  1950  . TOTAL HIP ARTHROPLASTY Left 12/23/2017   Procedure: TOTAL HIP ARTHROPLASTY;  Surgeon: Dereck Leep, MD;  Location: [facility] ORS;  Service: Orthopedics;  Laterality: Left;  [place] Kitchen VITREOUS RETINAL SURGERY Left 11/07/2011   PPV/SO  . VITREOUS RETINAL SURGERY Left 04/18/2011   TPPV/TRP/SO/SB/EL/C3F8    Family History  Problem Relation Age of Onset  . Hypertension Mother   . Stroke Father   . Hypertension Father   . Arthritis Other   . Prostate cancer Other   . Psoriasis  Other   . Stroke Other   . Thyroid disease Other   . Anesthesia problems Neg Hx   . Diabetes Neg Hx   . Glaucoma Neg Hx   . Macular degeneration Neg Hx     Social History:  reports that she quit smoking about 29 years ago. Her smoking use included cigarettes. She has a 17.50 pack-year smoking history. She has never used smokeless tobacco. She reports current alcohol use. She reports that she does not use drugs.  Allergies:  Allergies    Allergen Reactions  . Oxycodone Other (See Comments)    "makes her crazy"    Medications: I have reviewed the patient's current medications.  Results for orders placed or performed during the hospital encounter of 03/15/20 (from the past 48 hour(s))  Sedimentation rate     Status: Abnormal   Collection Time: 03/15/20  7:03 PM  Result Value Ref Range   Sed Rate 39 (H) 0 - 30 mm/hr    Comment: Performed at [facility], [address] [place], [place] [address]  C-reactive protein     Status: Abnormal   Collection Time: 03/15/20  7:45 PM  Result Value Ref Range   CRP 25.3 (H) <1.0 mg/dL    Comment: Performed at [facility], [place] [address] [place], [place] [address]  Basic metabolic panel     Status: Abnormal   Collection Time: 03/16/20  5:05 AM  Result Value Ref Range   Sodium 135 135 - 145 mmol/L   Potassium 3.6 3.5 - 5.1 mmol/L   Chloride 97 (L) 98 - 111 mmol/L   CO2 30 22 - 32 mmol/L   Glucose, Bld 102 (H) 70 - 99 mg/dL    Comment: Glucose reference range applies only to samples taken after fasting for at least 8 hours.   BUN 23 8 - 23 mg/dL   Creatinine, Ser 0.82 0.44 - 1.00 mg/dL   Calcium 7.6 (L) 8.9 - 10.3 mg/dL   GFR calc non Af Amer >60 >60 mL/min   GFR calc Af Amer >60 >60 mL/min   Anion gap 8 5 - 15    Comment: Performed at [facility], [address] [place], [place] [address]  CK     Status: None   Collection Time: 03/16/20  5:05 AM  Result Value Ref Range   Total CK 119 38.0 - 234.0 U/L    Comment: Performed at [facility], [address] [place],  [address]  CBC     Status: Abnormal   Collection Time: 03/16/20  6:45 AM  Result Value Ref Range   WBC 14.9 (H) 4.0 - 10.5 K/uL   RBC 3.23 (L) 3.87 - 5.11 MIL/uL   Hemoglobin 9.1 (L) 12.0 - 15.0 g/dL   HCT 27.7 (L) 36 - 46 %   MCV 85.8 80.0 - 100.0 fL   MCH 28.2 26.0 - 34.0 pg   MCHC 32.9 30.0 - 36.0 g/dL   RDW 16.5 (H) 11.5 - 15.5 %   Platelets 419 (H) 150 -  400 K/uL   nRBC 0.0 0.0 - 0.2 %    Comment: Performed at [facility], [address] [place], [place] [address]  C Difficile Quick Screen w PCR reflex     Status: None   Collection Time: 03/16/20 11:34 AM   Specimen: STOOL  Result Value Ref Range   C Diff antigen NEGATIVE NEGATIVE   C Diff toxin NEGATIVE NEGATIVE   C  Diff interpretation No C. difficile detected.     Comment: Performed at [facility], [address] [place], [place] [address]  CBC     Status: Abnormal   Collection Time: 03/17/20  8:19 AM  Result Value Ref Range   WBC 13.2 (H) 4.0 - 10.5 K/uL   RBC 3.49 (L) 3.87 - 5.11 MIL/uL   Hemoglobin 9.9 (L) 12.0 - 15.0 g/dL   HCT 28.8 (L) 36 - 46 %   MCV 82.5 80.0 - 100.0 fL   MCH 28.4 26.0 - 34.0 pg   MCHC 34.4 30.0 - 36.0 g/dL   RDW 16.5 (H) 11.5 - 15.5 %   Platelets 441 (H) 150 - 400 K/uL   nRBC 0.0 0.0 - 0.2 %    Comment: Performed at [facility], [address] [place], [place] [address]    US Venous Img Lower Bilateral (DVT)  Result Date: 03/16/2020 CLINICAL DATA:  86 year old female with a history of cellulitis EXAM: BILATERAL LOWER EXTREMITY VENOUS DOPPLER ULTRASOUND TECHNIQUE: Gray-scale sonography with graded compression, as well as color Doppler and duplex ultrasound were performed to evaluate the lower extremity deep venous systems from the level of the common femoral vein and including the common femoral, femoral, profunda femoral, popliteal and calf veins including the posterior tibial, peroneal and gastrocnemius veins when visible. The superficial great saphenous vein was also interrogated. Spectral Doppler was utilized to evaluate flow at rest and with distal augmentation maneuvers in the common femoral, femoral and popliteal veins. COMPARISON:  None. FINDINGS: RIGHT LOWER EXTREMITY Common Femoral Vein: No evidence of thrombus. Normal compressibility, respiratory phasicity and response to augmentation. Saphenofemoral Junction: No  evidence of thrombus. Normal compressibility and flow on color Doppler imaging. Profunda Femoral Vein: No evidence of thrombus. Normal compressibility and flow on color Doppler imaging. Femoral Vein: No evidence of thrombus. Normal compressibility, respiratory phasicity and response to augmentation. Popliteal Vein: No evidence of thrombus. Normal compressibility, respiratory phasicity and response to augmentation. Calf Veins: No evidence of thrombus. Normal compressibility and flow on color Doppler imaging. Superficial Great Saphenous Vein: No evidence of thrombus. Normal compressibility and flow on color Doppler imaging. Other Findings:  None. LEFT LOWER EXTREMITY Common Femoral Vein: No evidence of thrombus. Normal compressibility, respiratory phasicity and response to augmentation. Saphenofemoral Junction: No evidence of thrombus. Normal compressibility and flow on color Doppler imaging. Profunda Femoral Vein: No evidence of thrombus. Normal compressibility and flow on color Doppler imaging. Femoral Vein: No evidence of thrombus. Normal compressibility, respiratory phasicity and response to augmentation. Popliteal Vein: No evidence of thrombus. Normal compressibility, respiratory phasicity and response to augmentation. Calf Veins: No evidence of thrombus. Normal compressibility and flow on color Doppler imaging. Superficial Great Saphenous Vein: No evidence of thrombus. Normal compressibility and flow on color Doppler imaging. Other Findings:  None. IMPRESSION: Sonographic survey of the bilateral lower extremities negative for DVT Electronically Signed   By: Corrie Mckusick D.O.   On: 03/16/2020 11:25   US Venous Img Upper Uni Right(DVT)  Result Date: 03/16/2020 CLINICAL DATA:  86 year old female with a history of cellulitis EXAM: RIGHT UPPER EXTREMITY VENOUS DOPPLER ULTRASOUND TECHNIQUE: Gray-scale sonography with graded compression, as well as color Doppler and duplex ultrasound were performed to evaluate the  upper extremity deep venous system from the level of the subclavian vein and including the jugular, axillary, basilic, radial, ulnar and upper cephalic vein. Spectral Doppler was utilized to evaluate flow at rest and with distal augmentation maneuvers. COMPARISON:  None. FINDINGS: Contralateral  Subclavian Vein: Respiratory phasicity is normal and symmetric with the symptomatic side. No evidence of thrombus. Normal compressibility. Internal Jugular Vein: No evidence of thrombus. Normal compressibility, respiratory phasicity and response to augmentation. Subclavian Vein: No evidence of thrombus. Normal compressibility, respiratory phasicity and response to augmentation. Axillary Vein: No evidence of thrombus. Normal compressibility, respiratory phasicity and response to augmentation. Cephalic Vein: No evidence of thrombus. Normal compressibility, respiratory phasicity and response to augmentation. Basilic Vein: No evidence of thrombus. Normal compressibility, respiratory phasicity and response to augmentation. Brachial Veins: No evidence of thrombus. Normal compressibility, respiratory phasicity and response to augmentation. Radial Veins: No evidence of thrombus. Normal compressibility, respiratory phasicity and response to augmentation. Ulnar Veins: No evidence of thrombus. Normal compressibility, respiratory phasicity and response to augmentation. Other Findings:  Edema IMPRESSION: Sonographic survey of the right upper extremity negative for DVT Edema Electronically Signed   By: Corrie Mckusick D.O.   On: 03/16/2020 11:26    Review of Systems  Gastrointestinal: Positive for blood in stool, constipation, diarrhea and rectal pain.       Occasional incontinence of stool, with urgency.    Blood pressure (!) 100/51, pulse 61, temperature 97.8 F (36.6 C), temperature source Oral, resp. rate 16, height 5' (1.524 m), weight 78 kg, SpO2 97 %. Physical Exam Genitourinary:      Comments: Weak sphincter  tone.  Anoscopy: Large volume of granular, green stool.  Blood from the anterior based fissure. Minimal visualization of the rectal mucosa, but nothing to suggest a proximal bleeding source.   Imaging Review:   CT of the abdomen and pelvis was reviewed independently. No evidence of peri-rectal abscess. Very large bladder in a patient incontinent of urine, possible overflow incontinence.  Assessment/Plan:   Anoscopy: Large volume of granular, green stool.  Blood from the anterior based fissure. Minimal visualization of the rectal mucosa, but nothing to suggest a proximal bleeding source.   Ano-rectal pain secondary to an anterior fissure.  Topical creams have not been applied to the symptomatic area.  Anusul HC and Analpram cream are not available by report from the pharmacist.  Will use 1% hydrocortisone cream applied just inside the anus on the anterior wall TID.   Botox or sphincterotomy may produce incontinence if topical measures are not adequate.    Discussed with the patient and her two daughters as noted above.   Forest Gleason Saprina Chuong 03/17/2020, 6:07 PM

## 2020-03-17 NOTE — Consult Note (Signed)
Pharmacy Antibiotic Note  Joanne [name] is a 86 y.o. female admitted on 03/15/2020 with cellulitis.  Pharmacy has been consulted for Vancomycin dosing.  Plan: Continue Ceftriaxone 1g q 24h  Will start IV vancomycin loading dose of 1529m, followed by  Vancomycin 1250 IV every 24 hours.  Goal trough 10-15 mcg/mL. (per nomogram for cellulitis)  Height: 5' (152.4 cm) Weight: 78 kg (172 lb) IBW/kg (Calculated) : 45.5  Temp (24hrs), Avg:97.9 F (36.6 C), Min:97.8 F (36.6 C), Max:98 F (36.7 C)  Recent Labs  Lab 03/15/20 0857 03/15/20 0941 03/15/20 1545 03/16/20 0505 03/16/20 0645 03/17/20 0819  WBC 15.1*  --   --   --  14.9* 13.2*  CREATININE 0.92  --   --  0.82  --   --   LATICACIDVEN  --  1.3 1.8  --   --   --     Estimated Creatinine Clearance: 45.5 mL/min (by C-G formula based on SCr of 0.82 mg/dL).    Allergies  Allergen Reactions  . Oxycodone Other (See Comments)    "makes her crazy"    Antimicrobials this admission: 7/20 Cefazolin 1g x 1  Ceftriaxone 7/20 >> Vancomycin 7/22 >>  Dose adjustments this admission: None  Microbiology results: 7/20 BCx: NG x 2 days  C diff -/- COVID NEG  Thank you for allowing pharmacy to be a part of this patient's care.  CLu Duffel PharmD, BCPS Clinical Pharmacist 03/17/2020 1:04 PM

## 2020-03-17 NOTE — NC FL2 (Signed)
[place] LEVEL OF CARE SCREENING TOOL     IDENTIFICATION  Patient Name: Joanne [name] Birthdate: 01/05/1931 Sex: female Admission Date (Current Location): 03/15/2020  [facility] and [place] Number:  Engineering geologist and Address:         Provider Number: [id number]  Attending Physician Name and Address:  Loletha Grayer, MD  Relative Name and Phone Number:       Current Level of Care: Hospital Recommended Level of Care: [place] Prior Approval Number:    Date Approved/Denied:   PASRR Number: [id number]  Discharge Plan: SNF    Current Diagnoses: Patient Active Problem List   Diagnosis Date Noted   Weakness    Urinary retention    Right arm cellulitis 03/15/2020   Sepsis ([place]) 03/15/2020   Cellulitis of lower extremity 03/15/2020   Crohn disease ([place])    Depression    HTN (hypertension)    Proctitis    Swelling of limb 04/22/2019   Lymphedema 04/22/2019   Peripheral edema 04/07/2019   Hypomagnesemia 03/05/2019   Arthritis of right knee 09/03/2018   GERD without esophagitis 08/17/2018   Spinal stenosis of lumbar region with radiculopathy 08/17/2018   Status post kyphoplasty 08/17/2018   Vertebral compression fracture ([place]) 08/11/2018   Avascular necrosis of bone of hip, left ([place]) 01/06/2018   Chronic anemia 12/23/2017   Crohn's disease ([place]) 12/23/2017   Fuchs' corneal dystrophy 12/23/2017   Hyperlipidemia, unspecified 12/23/2017   Status post total replacement of hip 12/23/2017   Benign essential HTN 12/16/2017   Dyspnea on exertion 12/02/2017   Protein-calorie malnutrition ([place]) 04/21/2017   Age-related osteoporosis with current pathological fracture with routine healing 04/03/2017   Other dysphagia 01/24/2017   Hypokalemia 01/22/2017   Zenker diverticulum 09/25/2016   History of hypertension 05/03/2015   Hyponatremia 02/21/2015   Nuclear sclerotic cataract of right eye 09/08/2012    Right shoulder injury 05/27/2002    Orientation RESPIRATION BLADDER Height & Weight     Self, Time, Situation, Place  Normal Incontinent, External catheter Weight: 78 kg Height:  5' (152.4 cm)  BEHAVIORAL SYMPTOMS/MOOD NEUROLOGICAL BOWEL NUTRITION STATUS      Incontinent Diet (Heart healthy)  AMBULATORY STATUS COMMUNICATION OF NEEDS Skin   Extensive Assist Verbally Normal                       Personal Care Assistance Level of Assistance  Dressing, Bathing, Feeding Bathing Assistance: Maximum assistance Feeding assistance: Independent Dressing Assistance: Maximum assistance     Functional Limitations Info             SPECIAL CARE FACTORS FREQUENCY  PT (By licensed PT), OT (By licensed OT)                    Contractures Contractures Info: Not present    Additional Factors Info  Code Status, Allergies Code Status Info: Full Allergies Info: oxycodone           Current Medications (03/17/2020):  This is the current hospital active medication list Current Facility-Administered Medications  Medication Dose Route Frequency Provider Last Rate Last Admin   0.9 %  sodium chloride infusion   Intravenous Continuous Loletha Grayer, MD 40 mL/hr at 03/17/20 0948 Rate Verify at 03/17/20 0948   acetaminophen (TYLENOL) tablet 650 mg  650 mg Oral Q6H PRN Ivor Costa, MD   650 mg at 03/16/20 2041   ascorbic acid (VITAMIN C) tablet 500 mg  500 mg  Oral Daily Ivor Costa, MD   500 mg at 03/16/20 7169   aspirin EC tablet 81 mg  81 mg Oral Daily Ivor Costa, MD   81 mg at 03/16/20 0837   budesonide (ENTOCORT EC) 24 hr capsule 9 mg  9 mg Oral Daily Ivor Costa, MD   9 mg at 03/16/20 6789   calcium-vitamin D (OSCAL WITH D) 500-200 MG-UNIT per tablet 1 tablet  1 tablet Oral TID Ivor Costa, MD   1 tablet at 03/16/20 2041   cefTRIAXone (ROCEPHIN) 1 g in sodium chloride 0.9 % 100 mL IVPB  1 g Intravenous Q24H Ivor Costa, MD   Stopped at 03/16/20 2110   Chlorhexidine  Gluconate Cloth 2 % PADS 6 each  6 each Topical Daily Ivor Costa, MD   6 each at 03/16/20 1000   DULoxetine (CYMBALTA) DR capsule 20 mg  20 mg Oral Daily Ivor Costa, MD   20 mg at 03/16/20 1424   enoxaparin (LOVENOX) injection 40 mg  40 mg Subcutaneous Q24H Ivor Costa, MD   40 mg at 03/16/20 2041   gabapentin (NEURONTIN) capsule 300 mg  300 mg Oral TID Ivor Costa, MD   300 mg at 03/16/20 2041   hydrALAZINE (APRESOLINE) injection 5 mg  5 mg Intravenous Q2H PRN Ivor Costa, MD       hydrocortisone (ANUSOL-HC) 2.5 % rectal cream   Topical TID Ivor Costa, MD   Given at 03/16/20 2037   hydrocortisone (ANUSOL-HC) suppository 25 mg  25 mg Rectal BID Loletha Grayer, MD   25 mg at 03/16/20 2028   lidocaine (LIDODERM) 5 % 1 patch  1 patch Transdermal Q24H Ivor Costa, MD       liver oil-zinc oxide (DESITIN) 40 % ointment   Topical PRN Lang Snow, NP       liver oil-zinc oxide (DESITIN) 40 % ointment   Topical BID Lang Snow, NP   Given at 03/17/20 0058   loperamide (IMODIUM) capsule 2 mg  2 mg Oral TID PRN Ivor Costa, MD   2 mg at 03/17/20 0058   magnesium oxide (MAG-OX) tablet 400 mg  400 mg Oral Daily Ivor Costa, MD   400 mg at 03/16/20 3810   multivitamin with minerals tablet   Oral Daily Ivor Costa, MD   1 tablet at 03/16/20 1424   pantoprazole (PROTONIX) EC tablet 40 mg  40 mg Oral Daily Ivor Costa, MD   40 mg at 03/16/20 0837   prednisoLONE acetate (PRED FORTE) 1 % ophthalmic suspension 1 drop  1 drop Both Eyes QHS Ivor Costa, MD   1 drop at 03/16/20 2050   sodium chloride 0.9 % bolus 1,000 mL  1,000 mL Intravenous Once Ivor Costa, MD       traMADol Veatrice Bourbon) tablet 50 mg  50 mg Oral Q6H PRN Ivor Costa, MD       zinc sulfate capsule 220 mg  220 mg Oral Daily Ivor Costa, MD   220 mg at 03/16/20 1751     Discharge Medications: Please see discharge summary for a list of discharge medications.  Relevant Imaging Results:  Relevant Lab  Results:   Additional Information ss [id number]  [name], RN

## 2020-03-17 NOTE — TOC Initial Note (Signed)
Transition of Care [facility]) - Initial/Assessment Note    Patient Details  Name: Joanne [name] MRN: [id number] Date of Birth: [birth year]  Transition [facility]) CM/SW Contact:    [name], RN Phone Number: 03/17/2020, 10:02 AM  Clinical Narrative:                 Patient admitted from home with cellulitis Patient A&O with intermittent confusion   Assessment completed with daughter vickie.  Patient lives in her own home. 2 daughters live locally.  Patient has private pay 24/7 care.  At baseline patient primarily uses WC  PCP Hande - daughter provide transportation [facility] and [facility].  Denies issues obtaining medications  PT has assessed patient and recommends SNF.  They are in agreement.   Existing PASRR Fl2 sent for signature.  Bed search initiated.  Daughter request it not be sent to [facility], or [place]  At home patient has WC, RW, and elevated toilet seat.   Patient has received both covid vaccines  Expected Discharge Plan: Skilled Nursing Facility Barriers to Discharge: Continued Medical Work up   Patient Goals and CMS Choice        Expected Discharge Plan and Services Expected Discharge Plan: [place]   Discharge Planning Services: CM Consult   Living arrangements for the past 2 months: Single Family Home                                      Prior Living Arrangements/Services Living arrangements for the past 2 months: Single Family Home Lives with:: Other (Comment) (PCS) Patient language and need for interpreter reviewed:: Yes Do you feel safe going back to the place where you live?: Yes      Need for Family Participation in Patient Care: Yes (Comment) Care giver support system in place?: Yes (comment) Current home services: DME, [facility] Criminal Activity/Legal Involvement Pertinent to Current Situation/Hospitalization: No - Comment as needed  Activities of Daily Living Home Assistive  Devices/Equipment: Wheelchair ADL Screening (condition at time of admission) Patient's cognitive ability adequate to safely complete daily activities?: No Is the patient deaf or have difficulty hearing?: No Does the patient have difficulty seeing, even when wearing glasses/contacts?: No Does the patient have difficulty concentrating, remembering, or making decisions?: Yes Patient able to express need for assistance with ADLs?: Yes Does the patient have difficulty dressing or bathing?: Yes Independently performs ADLs?: No Does the patient have difficulty walking or climbing stairs?: Yes Weakness of Legs: Both Weakness of Arms/Hands: None  Permission Sought/Granted                  Emotional Assessment       Orientation: : Oriented to Situation, Oriented to  Time, Oriented to Place, Oriented to Self Alcohol / Substance Use: Not Applicable Psych Involvement: No (comment)  Admission diagnosis:  Sinus tachycardia [R00.0] Urinary retention [R33.9] Cellulitis [L03.90] Cellulitis of right upper extremity [L03.113] Generalized weakness [R53.1] Right arm cellulitis [L03.113] Cellulitis of lower extremity, unspecified laterality [L03.119] Patient Active Problem List   Diagnosis Date Noted  . Weakness   . Urinary retention   . Right arm cellulitis 03/15/2020  . Sepsis ([place]) 03/15/2020  . Cellulitis of lower extremity 03/15/2020  . Crohn disease ([place])   . Depression   . HTN (hypertension)   . Proctitis   . Swelling of limb 04/22/2019  .  Lymphedema 04/22/2019  . Peripheral edema 04/07/2019  . Hypomagnesemia 03/05/2019  . Arthritis of right knee 09/03/2018  . GERD without esophagitis 08/17/2018  . Spinal stenosis of lumbar region with radiculopathy 08/17/2018  . Status post kyphoplasty 08/17/2018  . Vertebral compression fracture ([place]) 08/11/2018  . Avascular necrosis of bone of hip, left ([place]) 01/06/2018  . Chronic anemia 12/23/2017  . Crohn's disease ([place]) 12/23/2017  .  Fuchs' corneal dystrophy 12/23/2017  . Hyperlipidemia, unspecified 12/23/2017  . Status post total replacement of hip 12/23/2017  . Benign essential HTN 12/16/2017  . Dyspnea on exertion 12/02/2017  . Protein-calorie malnutrition ([place]) 04/21/2017  . Age-related osteoporosis with current pathological fracture with routine healing 04/03/2017  . Other dysphagia 01/24/2017  . Hypokalemia 01/22/2017  . [place] diverticulum 09/25/2016  . History of hypertension 05/03/2015  . Hyponatremia 02/21/2015  . Nuclear sclerotic cataract of right eye 09/08/2012  . Right shoulder injury 05/27/2002   PCP:  Tracie Harrier, MD Pharmacy:   [place], [place] - [address] [place] [address] Phone: [phone] Fax: [phone]  [facility] ([place] [address] Phone: [phone] Fax: [phone]     Social Determinants of Health (SDOH) Interventions    Readmission Risk Interventions No flowsheet data found.

## 2020-03-17 NOTE — Progress Notes (Signed)
OT Cancellation Note  Patient Details Name: NAIYAH KLOSTERMANN MRN: [id number] DOB: 12-Feb-1931   Cancelled Treatment:    Reason Eval/Treat Not Completed: Other (comment). Order received, chart reviewed. Upon arrival for evaluation, pt and family with MD. Will re-attempt at a later date/time as pt available and schedule permits.  Jerilynn Birkenhead, OTS 03/17/20, 11:39 AM

## 2020-03-17 NOTE — Progress Notes (Signed)
Patient ID: Joanne [name], female   DOB: [birth year], 86 y.o.   MRN: [id number] [facility] PROGRESS NOTE  AKIERA ALLBAUGH DZH:[id number] DOB: Jul 28, 1931 DOA: 03/15/2020 PCP: Tracie Harrier, MD  HPI/Subjective: Patient still feeling horrible. Originally came in for fall and redness on her arm. Patient has pain in her arm up to a 10 out of 10 intensity if she moves it but otherwise at rest feels okay. States she has some neck discomfort. States she woke up and was disoriented and thought she was at home on her couch. Did not realize that she was in the hospital initially. Patient had a lot of diarrhea last night but settled down after Imodium.  Objective: Vitals:   03/17/20 0402 03/17/20 1126  BP: 137/63 (!) 100/51  Pulse: 89 61  Resp: 15 16  Temp: 98 F (36.7 C) 97.8 F (36.6 C)  SpO2: 96% 97%    Intake/Output Summary (Last 24 hours) at 03/17/2020 1300 Last data filed at 03/17/2020 0948 Gross per 24 hour  Intake 1047.71 ml  Output 450 ml  Net 597.71 ml   Filed Weights   03/15/20 0853  Weight: 78 kg    ROS: Review of Systems  Respiratory: Negative for shortness of breath.   Cardiovascular: Negative for chest pain.  Gastrointestinal: Positive for diarrhea. Negative for abdominal pain.  Musculoskeletal: Positive for joint pain.   Exam: Physical Exam HENT:     Head: Normocephalic.     Nose: No mucosal edema.     Mouth/Throat:     Pharynx: No oropharyngeal exudate.  Eyes:     General: Lids are normal.     Extraocular Movements: Extraocular movements intact.     Pupils: Pupils are equal, round, and reactive to light.  Neck:     Comments: Patient with slight tenderness in the left parasternal muscle Cardiovascular:     Rate and Rhythm: Normal rate and regular rhythm.     Heart sounds: Normal heart sounds and S1 normal.  Pulmonary:     Breath sounds: No decreased breath sounds, wheezing, rhonchi or rales.  Abdominal:     Palpations: Abdomen is soft.      Tenderness: There is no abdominal tenderness.  Musculoskeletal:     Cervical back: Normal range of motion. Muscular tenderness present.     Right ankle: Swelling present.     Left ankle: Swelling present.  Skin:    General: Skin is warm.     Comments: Erythema right arm similar than yesterday starting to have areas of weeping. Bilateral lower extremity erythema on the lower legs.  Neurological:     Mental Status: She is alert and oriented to person, place, and time.       Data Reviewed: Basic Metabolic Panel: Recent Labs  Lab 03/15/20 0857 03/16/20 0505  NA 135 135  K 3.7 3.6  CL 95* 97*  CO2 31 30  GLUCOSE 123* 102*  BUN 23 23  CREATININE 0.92 0.82  CALCIUM 7.9* 7.6*   CBC: Recent Labs  Lab 03/15/20 0857 03/16/20 0645 03/17/20 0819  WBC 15.1* 14.9* 13.2*  HGB 10.8* 9.1* 9.9*  HCT 33.4* 27.7* 28.8*  MCV 86.5 85.8 82.5  PLT 488* 419* 441*   Cardiac Enzymes: Recent Labs  Lab 03/16/20 0505  CKTOTAL 119   BNP (last 3 results) Recent Labs    03/15/20 0857  BNP 573.7*      Recent Results (from the past 240 hour(s))  SARS Coronavirus 2 by RT PCR (hospital order,  performed in [facility] hospital lab) Nasopharyngeal Nasopharyngeal Swab     Status: None   Collection Time: 03/15/20  9:41 AM   Specimen: Nasopharyngeal Swab  Result Value Ref Range Status   SARS Coronavirus 2 NEGATIVE NEGATIVE Final    Comment: (NOTE) SARS-CoV-2 target nucleic acids are NOT DETECTED.  The SARS-CoV-2 RNA is generally detectable in upper and lower respiratory specimens during the acute phase of infection. The lowest concentration of SARS-CoV-2 viral copies this assay can detect is 250 copies / mL. A negative result does not preclude SARS-CoV-2 infection and should not be used as the sole basis for treatment or other patient management decisions.  A negative result may occur with improper specimen collection / handling, submission of specimen other than nasopharyngeal swab,  presence of viral mutation(s) within the areas targeted by this assay, and inadequate number of viral copies (<250 copies / mL). A negative result must be combined with clinical observations, patient history, and epidemiological information.  Fact Sheet for Patients:   StrictlyIdeas.no  Fact Sheet for Healthcare Providers: BankingDealers.co.za  This test is not yet approved or  cleared by the [place] FDA and has been authorized for detection and/or diagnosis of SARS-CoV-2 by FDA under an Emergency Use Authorization (EUA).  This EUA will remain in effect (meaning this test can be used) for the duration of the COVID-19 declaration under Section 564(b)(1) of the Act, 21 U.S.C. section 360bbb-3(b)(1), unless the authorization is terminated or revoked sooner.  Performed at [facility], [address] [place], [place] [address]   Culture, blood (x 2)     Status: None (Preliminary result)   Collection Time: 03/15/20  9:41 AM   Specimen: BLOOD  Result Value Ref Range Status   Specimen Description BLOOD RIGHT ANTECUBITAL  Final   Special Requests   Final    BOTTLES DRAWN AEROBIC AND ANAEROBIC Blood Culture results may not be optimal due to an excessive volume of blood received in culture bottles   Culture   Final    NO GROWTH 2 DAYS Performed at [facility], [address] [place], [place] [address]    Report Status PENDING  Incomplete  Culture, blood (x 2)     Status: None (Preliminary result)   Collection Time: 03/15/20  9:41 AM   Specimen: BLOOD  Result Value Ref Range Status   Specimen Description BLOOD LEFT ANTECUBITAL  Final   Special Requests   Final    BOTTLES DRAWN AEROBIC AND ANAEROBIC Blood Culture results may not be optimal due to an excessive volume of blood received in culture bottles   Culture   Final    NO GROWTH 2 DAYS Performed at [facility], [address] [place], [place]  [address]    Report Status PENDING  Incomplete  C Difficile Quick Screen w PCR reflex     Status: None   Collection Time: 03/16/20 11:34 AM   Specimen: STOOL  Result Value Ref Range Status   C Diff antigen NEGATIVE NEGATIVE Final   C Diff toxin NEGATIVE NEGATIVE Final   C Diff interpretation No C. difficile detected.  Final    Comment: Performed at [facility], [address] [place], [place] [address]     Studies: US Venous Img Lower Bilateral (DVT)  Result Date: 03/16/2020 CLINICAL DATA:  86 year old female with a history of cellulitis EXAM: BILATERAL LOWER EXTREMITY VENOUS DOPPLER ULTRASOUND TECHNIQUE: Gray-scale sonography with graded compression, as well as color Doppler and duplex  ultrasound were performed to evaluate the lower extremity deep venous systems from the level of the common femoral vein and including the common femoral, femoral, profunda femoral, popliteal and calf veins including the posterior tibial, peroneal and gastrocnemius veins when visible. The superficial great saphenous vein was also interrogated. Spectral Doppler was utilized to evaluate flow at rest and with distal augmentation maneuvers in the common femoral, femoral and popliteal veins. COMPARISON:  None. FINDINGS: RIGHT LOWER EXTREMITY Common Femoral Vein: No evidence of thrombus. Normal compressibility, respiratory phasicity and response to augmentation. Saphenofemoral Junction: No evidence of thrombus. Normal compressibility and flow on color Doppler imaging. Profunda Femoral Vein: No evidence of thrombus. Normal compressibility and flow on color Doppler imaging. Femoral Vein: No evidence of thrombus. Normal compressibility, respiratory phasicity and response to augmentation. Popliteal Vein: No evidence of thrombus. Normal compressibility, respiratory phasicity and response to augmentation. Calf Veins: No evidence of thrombus. Normal compressibility and flow on color Doppler imaging. Superficial Great  Saphenous Vein: No evidence of thrombus. Normal compressibility and flow on color Doppler imaging. Other Findings:  None. LEFT LOWER EXTREMITY Common Femoral Vein: No evidence of thrombus. Normal compressibility, respiratory phasicity and response to augmentation. Saphenofemoral Junction: No evidence of thrombus. Normal compressibility and flow on color Doppler imaging. Profunda Femoral Vein: No evidence of thrombus. Normal compressibility and flow on color Doppler imaging. Femoral Vein: No evidence of thrombus. Normal compressibility, respiratory phasicity and response to augmentation. Popliteal Vein: No evidence of thrombus. Normal compressibility, respiratory phasicity and response to augmentation. Calf Veins: No evidence of thrombus. Normal compressibility and flow on color Doppler imaging. Superficial Great Saphenous Vein: No evidence of thrombus. Normal compressibility and flow on color Doppler imaging. Other Findings:  None. IMPRESSION: Sonographic survey of the bilateral lower extremities negative for DVT Electronically Signed   By: Corrie Mckusick D.O.   On: 03/16/2020 11:25   US Venous Img Upper Uni Right(DVT)  Result Date: 03/16/2020 CLINICAL DATA:  86 year old female with a history of cellulitis EXAM: RIGHT UPPER EXTREMITY VENOUS DOPPLER ULTRASOUND TECHNIQUE: Gray-scale sonography with graded compression, as well as color Doppler and duplex ultrasound were performed to evaluate the upper extremity deep venous system from the level of the subclavian vein and including the jugular, axillary, basilic, radial, ulnar and upper cephalic vein. Spectral Doppler was utilized to evaluate flow at rest and with distal augmentation maneuvers. COMPARISON:  None. FINDINGS: Contralateral Subclavian Vein: Respiratory phasicity is normal and symmetric with the symptomatic side. No evidence of thrombus. Normal compressibility. Internal Jugular Vein: No evidence of thrombus. Normal compressibility, respiratory phasicity  and response to augmentation. Subclavian Vein: No evidence of thrombus. Normal compressibility, respiratory phasicity and response to augmentation. Axillary Vein: No evidence of thrombus. Normal compressibility, respiratory phasicity and response to augmentation. Cephalic Vein: No evidence of thrombus. Normal compressibility, respiratory phasicity and response to augmentation. Basilic Vein: No evidence of thrombus. Normal compressibility, respiratory phasicity and response to augmentation. Brachial Veins: No evidence of thrombus. Normal compressibility, respiratory phasicity and response to augmentation. Radial Veins: No evidence of thrombus. Normal compressibility, respiratory phasicity and response to augmentation. Ulnar Veins: No evidence of thrombus. Normal compressibility, respiratory phasicity and response to augmentation. Other Findings:  Edema IMPRESSION: Sonographic survey of the right upper extremity negative for DVT Edema Electronically Signed   By: Corrie Mckusick D.O.   On: 03/16/2020 11:26    Scheduled Meds: . ascorbic acid  500 mg Oral Daily  . aspirin EC  81 mg Oral Daily  . bethanechol  25 mg  Oral Once  . budesonide  9 mg Oral Daily  . calcium-vitamin D  1 tablet Oral TID  . Chlorhexidine Gluconate Cloth  6 each Topical Daily  . DULoxetine  20 mg Oral Daily  . enoxaparin (LOVENOX) injection  40 mg Subcutaneous Q24H  . gabapentin  300 mg Oral TID  . hydrocortisone   Topical TID  . hydrocortisone  25 mg Rectal BID  . lidocaine  1 patch Transdermal Q24H  . liver oil-zinc oxide   Topical BID  . magnesium oxide  400 mg Oral Daily  . multivitamin with minerals   Oral Daily  . pantoprazole  40 mg Oral Daily  . prednisoLONE acetate  1 drop Both Eyes QHS  . zinc sulfate  220 mg Oral Daily   Continuous Infusions: . sodium chloride 40 mL/hr at 03/17/20 1139  . cefTRIAXone (ROCEPHIN)  IV Stopped (03/16/20 2110)  . sodium chloride    . vancomycin      Assessment/Plan:  1. Clinical  sepsis, present on admission with acute metabolic encephalopathy, leukocytosis and tachypnea. Admitting physician ordered Rocephin. I will add vancomycin since right arm still very painful and erythematous. Continue to follow-up cultures. Stercoral colitis would also be covered with Rocephin. 2. Hemorrhoids, stercoral colitis. Dr. Bary Castilla general surgery to see patient. Continue Anusol suppositories and cream. 3. Weakness. Physical therapy recommends rehab. 4. GERD without esophagitis on PPI 5. Crohn's disease on budesonide. 6. Depression on Cymbalta 7. Urinary retention. Foley removed this morning we will see if the patient urinates. We will give a dose of Urecholine.   Code Status:     Code Status Orders  (From admission, onward)         Start     Ordered   03/15/20 1525  Full code  Continuous        03/15/20 1527        Code Status History    Date Active Date Inactive Code Status Order ID Comments User Context   08/11/2018 1854 08/13/2018 2032 Full Code [id number]  Henreitta Leber, MD Inpatient   08/11/2018 1737 08/11/2018 1854 DNR [id number]  Saundra Shelling, MD Inpatient   08/11/2018 1735 08/11/2018 1736 Full Code [id number]  Saundra Shelling, MD Inpatient   12/23/2017 1137 12/25/2017 1808 Full Code [id number]  Dereck Leep, MD Inpatient   01/22/2017 1502 01/23/2017 1413 Full Code [id number]  Hillary Bow, MD ED   02/21/2015 1939 02/23/2015 1908 Full Code [id number]  Loletha Grayer, MD ED   Advance Care Planning Activity     Family Communication: Daughter at the bedside Disposition Plan: Status is: Inpatient  Dispo: The patient is from: Home              Anticipated d/c is to: Rehab              Anticipated d/c date is: Since I added vancomycin today will likely need another 2 days of IV vancomycin plus Rocephin prior to disposition              Patient currently treating for extensive cellulitis of right arm and bilateral lower  extremities.  Antibiotics:  Rocephin  Vancomycin  Time spent: 27 minutes  [place]

## 2020-03-17 NOTE — Evaluation (Signed)
Occupational Therapy Evaluation Patient Details Name: Joanne [name] MRN: [id number] DOB: 03-12-1931 Today's Date: 03/17/2020    History of Present Illness presented to ER secondary to pain, redness to R UE, bilat LEs; admitted for management of sepsis related to R UE, bilat LE cellulitis.   Clinical Impression   Pt presents this morning awake, spunky and eager to engage in conversation. Her daughter was present and contributed additional information to the evaluation. Pt lives at home with a 24/7 PCA who assists with ADL/IADL management. Per pt, she is "mostly" independent in ADL, primarily uses a w/c for mobility and her PCA takes care of household chores and assisting with pulling her pants up in dressing/toileting. Per pt's daughter, pt has been requiring increased assistance recently, particularly since a fall last Sunday. Pt and daughter report that she has had 2 falls and 3-4 "slips" in the past 6 months. During session, pt is alert and oriented, though with decreased insight into deficits and intermittent confusion. Pt is aware of this. She is limited by general pain and significant edema and weeping in B UE (R > L). Pt currently requires MAX A for changing her gown at bed level and adjusting socks. Pt requires MAX A +2 for boosting up in bed. Repositioned pt's arms on towel rolls for optimal comfort and edema management. Pt will benefit from skilled acute OT services to address current impairments (see OT Problem List) impacting baseline ADL performance. Recommend transition to SNF upon discharge to maximize pt safety and outcomes.       Follow Up Recommendations  SNF    Equipment Recommendations  Other (comment) (TBD at next venue)    Recommendations for Other Services       Precautions / Restrictions Precautions Precautions: Fall Restrictions Weight Bearing Restrictions: No      Mobility Bed Mobility Overal bed mobility: Needs Assistance             General bed  mobility comments: Pt required MAX A x2 for boosting up in bed.  Transfers                 General transfer comment: deferred this date 2/2 pt pain, BUE weeping and fatigue    Balance                                           ADL either performed or assessed with clinical judgement   ADL Overall ADL's : Needs assistance/impaired                                       General ADL Comments: Pt requires MAX A for changing gown at bed level and readjusting socks. She is limited by pain and B UE weeping. Anticipate that pt currently requires MAX A for bed level LB ADL. Per PT note and daughter report, pt requires MAX A +2 for sitting up to EOB.     Vision Baseline Vision/History: Wears glasses (history of blurry vision and L eye retinal detachment) Wears Glasses: Reading only Patient Visual Report: No change from baseline (pt cannot read ID badge, even with glasses)       Perception     Praxis      Pertinent Vitals/Pain Pain Assessment: Faces ("I don't like to give numbers") Faces Pain Scale:  Hurts little more (with movement) Pain Location: R UE Pain Descriptors / Indicators: Guarding;Grimacing Pain Intervention(s): Limited activity within patient's tolerance;Monitored during session;Repositioned     Hand Dominance     Extremity/Trunk Assessment Upper Extremity Assessment Upper Extremity Assessment: Generalized weakness (not formally assessed due to bilateral weeping and pain with movement)   Lower Extremity Assessment Lower Extremity Assessment: Generalized weakness (both calves noted to be red and tight)       Communication Communication Communication: HOH   Cognition Arousal/Alertness: Awake/alert Behavior During Therapy: WFL for tasks assessed/performed Overall Cognitive Status: Within Functional Limits for tasks assessed                                 General Comments: oriented to self, location and general  situation; follows simple commands; intermittent confusion evident (daughter reports patient 'seeing things' since being in the hospital- attributes to medication); pt endorses feeling more confused in the mornings; pt does demonstrate limited insight into deficits   General Comments  B UE edema, weeping and redness noted (R > L), B LE redness in the calves. HR noted to jump from 107 to 140s while lying. RN notified.    Exercises Other Exercises Other Exercises: Pt and daughter educated re: the role of OT in acute care, discharge planning recommendations, managing confusion in the hospital and safe ADL participation. Other Exercises: Bilateral UEs elevated to reduce edema and for comfort/pain relief.   Shoulder Instructions      Home Living Family/patient expects to be discharged to:: Private residence Living Arrangements: Non-relatives/Friends Available Help at Discharge: Available 24 hours/day;Personal care attendant Type of Home: House Home Access: Stairs to enter [employer] of Steps: 1 Entrance Stairs-Rails: None Home Layout: Two level;Able to live on main level with bedroom/bathroom               Home Equipment: Walker - 4 wheels   Additional Comments: Pt has a PCA who lives with her 24/7 to assist.      Prior Functioning/Environment Level of Independence: Needs assistance  Gait / Transfers Assistance Needed: Pt reports that she primarily uses a w/c for mobility. She reports that before she fell last Sunday, she was Sup/Mod I for transfers. Her daughters reports that since her fall, she has needed physical assistance. ADL's / Homemaking Assistance Needed: Pt reports that she can "mostly" do for herself. With prompting from her daughter, pt endorses needing assistance for LBD, bathing and toilet transfers. PCA assists with ADL and IADL.   Comments: Multiple falls in the past 6 months. At least 2 falls and 3-4 "slips."        OT Problem List: Decreased  strength;Decreased range of motion;Decreased activity tolerance;Impaired balance (sitting and/or standing);Impaired UE functional use;Decreased safety awareness;Cardiopulmonary status limiting activity;Decreased cognition;Increased edema;Obesity;Pain      OT Treatment/Interventions: Self-care/ADL training;Therapeutic exercise;Therapeutic activities;Cognitive remediation/compensation;DME and/or AE instruction;Patient/family education;Balance training    OT Goals(Current goals can be found in the care plan section) Acute Rehab OT Goals Patient Stated Goal: "To feel less crazy" OT Goal Formulation: With patient/family Time For Goal Achievement: 03/31/20 Potential to Achieve Goals: Good ADL Goals Pt Will Perform Grooming: sitting;with supervision Pt Will Perform Upper Body Dressing: with set-up;bed level Pt Will Transfer to Toilet: with min assist;bedside commode;stand pivot transfer  OT Frequency: Min 2X/week   Barriers to D/C:            Co-evaluation  AM-PAC OT "6 Clicks" Daily Activity     Outcome Measure Help from another person eating meals?: A Little Help from another person taking care of personal grooming?: A Little Help from another person toileting, which includes using toliet, bedpan, or urinal?: A Lot Help from another person bathing (including washing, rinsing, drying)?: A Lot Help from another person to put on and taking off regular upper body clothing?: A Lot Help from another person to put on and taking off regular lower body clothing?: A Lot 6 Click Score: 14   End of Session    Activity Tolerance: Patient limited by pain;Patient tolerated treatment well;Patient limited by fatigue Patient left: in bed;with call bell/phone within reach;with bed alarm set;with family/visitor present  OT Visit Diagnosis: Other abnormalities of gait and mobility (R26.89);Repeated falls (R29.6);Muscle weakness (generalized) (M62.81);History of falling  (Z91.81);Pain Pain - Right/Left:  (general)                Time: 8675-4492 OT Time Calculation (min): 35 min Charges:  OT General Charges $OT Visit: 1 Visit OT Evaluation $OT Eval Moderate Complexity: 1 Mod OT Treatments $Self Care/Home Management : 8-22 mins   Jerilynn Birkenhead, OTS 03/17/20, 3:04 PM

## 2020-03-17 NOTE — Progress Notes (Signed)
Full note to follow.  Anoscopy shows an anterior anal fissure with bleeding. Extensive small, non-inflamed skin tags.   RX for application of Analpram HC or Anusol HC cream to the interior anal surface should help.   Full note to follow.

## 2020-03-18 DIAGNOSIS — R531 Weakness: Secondary | ICD-10-CM | POA: Diagnosis not present

## 2020-03-18 DIAGNOSIS — E876 Hypokalemia: Secondary | ICD-10-CM | POA: Diagnosis not present

## 2020-03-18 DIAGNOSIS — L03113 Cellulitis of right upper limb: Secondary | ICD-10-CM | POA: Diagnosis not present

## 2020-03-18 DIAGNOSIS — A419 Sepsis, unspecified organism: Secondary | ICD-10-CM | POA: Diagnosis not present

## 2020-03-18 DIAGNOSIS — K602 Anal fissure, unspecified: Secondary | ICD-10-CM

## 2020-03-18 LAB — BASIC METABOLIC PANEL
Anion gap: 13 (ref 5–15)
BUN: 15 mg/dL (ref 8–23)
CO2: 30 mmol/L (ref 22–32)
Calcium: 7.9 mg/dL — ABNORMAL LOW (ref 8.9–10.3)
Chloride: 94 mmol/L — ABNORMAL LOW (ref 98–111)
Creatinine, Ser: 0.73 mg/dL (ref 0.44–1.00)
GFR calc Af Amer: >60 mL/min (ref >60)
GFR calc non Af Amer: >60 mL/min (ref >60)
Glucose, Bld: 96 mg/dL (ref 70–99)
Potassium: 2.5 mmol/L — CL (ref 3.5–5.1)
Sodium: 137 mmol/L (ref 135–145)

## 2020-03-18 LAB — MAGNESIUM: Magnesium: 2.1 mg/dL (ref 1.7–2.4)

## 2020-03-18 MED ORDER — TRAZODONE HCL 50 MG PO TABS: 50.0000 mg | ORAL_TABLET | Freq: Every evening | ORAL | Status: DC | PRN

## 2020-03-18 MED ORDER — POTASSIUM CHLORIDE CRYS ER 20 MEQ PO TBCR
40.0000 meq | EXTENDED_RELEASE_TABLET | Freq: Three times a day (TID) | ORAL | Status: AC
  Administered 2020-03-18 (×3): 40 meq via ORAL
  Filled 2020-03-18 (×3): qty 2

## 2020-03-18 MED ORDER — POTASSIUM CHLORIDE 10 MEQ/100ML IV SOLN
10.0000 meq | INTRAVENOUS | Status: DC
  Administered 2020-03-18: 10 meq via INTRAVENOUS
  Filled 2020-03-18: qty 100

## 2020-03-18 MED ORDER — METOPROLOL TARTRATE 5 MG/5ML IV SOLN
2.5000 mg | INTRAVENOUS | Status: DC | PRN
  Administered 2020-03-19 (×2): 2.5 mg via INTRAVENOUS
  Filled 2020-03-18 (×2): qty 5

## 2020-03-18 MED ORDER — POTASSIUM CHLORIDE 10 MEQ/100ML IV SOLN
10.0000 meq | INTRAVENOUS | Status: AC
  Administered 2020-03-18 (×3): 10 meq via INTRAVENOUS
  Filled 2020-03-18 (×2): qty 100

## 2020-03-18 MED ORDER — LOPERAMIDE HCL 2 MG PO CAPS
4.0000 mg | ORAL_CAPSULE | Freq: Three times a day (TID) | ORAL | Status: DC | PRN
  Administered 2020-03-18 – 2020-03-20 (×2): 4 mg via ORAL
  Filled 2020-03-18 (×2): qty 2

## 2020-03-18 MED ORDER — VANCOMYCIN HCL 750 MG/150ML IV SOLN
750.0000 mg | INTRAVENOUS | Status: DC
  Administered 2020-03-18 – 2020-03-21 (×4): 750 mg via INTRAVENOUS
  Filled 2020-03-18 (×5): qty 150

## 2020-03-18 NOTE — TOC Progression Note (Signed)
Transition of Care [facility]) - Progression Note    Patient Details  Name: Joanne [name] MRN: [id number] Date of Birth: 04-Oct-1930  Transition of Care [facility]) CM/SW Contact  [name], RN Phone Number: 03/18/2020, 3:56 PM  Clinical Narrative:    Followed up with daughter [name].  [facility] selected.  Insurance auth started through [employer] at [employer] is aware of potential discharge over the weekend.  If patient discharges over the weekend please call Ricky at [phone], and also call [facility] number and ask to speak with Levada Dy   Expected Discharge Plan: Skilled Nursing Facility Barriers to Discharge: Continued Medical Work up  Expected Discharge Plan and Services Expected Discharge Plan: [place]   Discharge Planning Services: CM Consult   Living arrangements for the past 2 months: Single Family Home                                       Social Determinants of Health (SDOH) Interventions    Readmission Risk Interventions No flowsheet data found.

## 2020-03-18 NOTE — Care Management Important Message (Signed)
Important Message  Patient Details  Name: Joanne [name] MRN: [id number] Date of Birth: 12-23-1930   Medicare Important Message Given:  Yes     [name] A Aidynn Krenn 03/18/2020, 10:59 AM

## 2020-03-18 NOTE — TOC Progression Note (Signed)
Transition of Care [facility]) - Progression Note    Patient Details  Name: Joanne [name] MRN: [id number] Date of Birth: 10/31/1930  Transition of Care [facility]) CM/SW Contact  [name], RN Phone Number: 03/18/2020, 2:37 PM  Clinical Narrative:    Per daughter Cindy's request bed offers emailed to her.  Will follow up at 3:30 today for their decision    Expected Discharge Plan: [place] Barriers to Discharge: Continued Medical Work up  Expected Discharge Plan and Services Expected Discharge Plan: [place]   Discharge Planning Services: CM Consult   Living arrangements for the past 2 months: Single Family Home                                       Social Determinants of Health (SDOH) Interventions    Readmission Risk Interventions No flowsheet data found.

## 2020-03-18 NOTE — Consult Note (Signed)
Pharmacy Antibiotic Note  Joanne [name] is a 86 y.o. female admitted on 03/15/2020 with right arm cellulitis and stercoral colitis. She was started on ceftriaxone since admission and vancomycin was added 7/22 since the wound continued to be painful and erythematous.  Pharmacy was consulted for vancomycin dosing. Since admission her renal function has been stable at at her apparent baseline level.  Vancomycin Plan:  Adjust vancomycin dose to 750 IV every 24 hours  T1/2: 20.1h, Ke: 0.034 h-1  Css (calculated): 34.2/14.9 mcg/mL  SCr daily to assess renal function  vancomycin level as clinically indicated  Height: 5' (152.4 cm) Weight: 78 kg (172 lb) IBW/kg (Calculated) : 45.5  Temp (24hrs), Avg:98 F (36.7 C), Min:97.8 F (36.6 C), Max:98.1 F (36.7 C)  Recent Labs  Lab 03/15/20 0857 03/15/20 0941 03/15/20 1545 03/16/20 0505 03/16/20 0645 03/17/20 0819 03/18/20 0437  WBC 15.1*  --   --   --  14.9* 13.2*  --   CREATININE 0.92  --   --  0.82  --   --  0.73  LATICACIDVEN  --  1.3 1.8  --   --   --   --     Estimated Creatinine Clearance: 46.6 mL/min (by C-G formula based on SCr of 0.73 mg/dL).    Antimicrobials this admission: 7/20 Cefazolin 1g x 1 Ceftriaxone 7/20 >> Vancomycin 7/22 >>  Microbiology results: 7/20 BCx: NG x 3 days 7/21 C diff -/- 7/20 SARS CoV-2: negative  Thank you for allowing pharmacy to be a part of this patient's care.  Dallie Piles, PharmD, BCPS Clinical Pharmacist 03/18/2020 7:07 AM

## 2020-03-18 NOTE — Progress Notes (Signed)
PT Cancellation Note  Patient Details Name: Joanne [name] MRN: [id number] DOB: [birth year]   Cancelled Treatment:    Reason Eval/Treat Not Completed: Fatigue/lethargy limiting ability to participate (Treatment session attempted.  Patient politely declines at this time, endorsing "a bad day" and generally not feeling up to it.  Requests therapist re-attempt another day. Will continue efforts as appropriat.e)   Reyes Ivan. Owens Shark, PT, DPT, NCS 03/18/20, 3:32 PM [phone]

## 2020-03-18 NOTE — Progress Notes (Signed)
Patient ID: Joanne [name], female   DOB: Apr 27, 1931, 86 y.o.   MRN: [id number] [facility] PROGRESS NOTE  LYNZE REDDY XBJ:[id number] DOB: 07-16-1931 DOA: 03/15/2020 PCP: Tracie Harrier, MD  HPI/Subjective: Patient still having some diarrhea.  Has hemorrhoids and a fissure.  Still not feeling good.  Did not sleep very well last night.  This morning's potassium is very low.  Patient has irritation in her vaginal and rectal area.  Right arm is still painful.  Came in with a right arm cellulitis.  Objective: Vitals:   03/18/20 0748 03/18/20 1156  BP: (!) 120/54 (!) 113/60  Pulse: 101 67  Resp: 20 16  Temp: 98.1 F (36.7 C) 99 F (37.2 C)  SpO2: 94% 98%    Intake/Output Summary (Last 24 hours) at 03/18/2020 1317 Last data filed at 03/18/2020 3086 Gross per 24 hour  Intake 277.65 ml  Output 100 ml  Net 177.65 ml   Filed Weights   03/15/20 0853  Weight: 78 kg    ROS: Review of Systems  Respiratory: Negative for shortness of breath.   Cardiovascular: Negative for chest pain.  Gastrointestinal: Positive for diarrhea. Negative for abdominal pain.  Musculoskeletal: Positive for joint pain.   Exam: Physical Exam HENT:     Head: Normocephalic.     Nose: No mucosal edema.     Mouth/Throat:     Pharynx: No oropharyngeal exudate.  Eyes:     General: Lids are normal.     Conjunctiva/sclera: Conjunctivae normal.     Pupils: Pupils are equal, round, and reactive to light.  Cardiovascular:     Rate and Rhythm: Normal rate and regular rhythm.     Heart sounds: Normal heart sounds, S1 normal and S2 normal.  Pulmonary:     Breath sounds: No decreased breath sounds, wheezing, rhonchi or rales.  Abdominal:     Palpations: Abdomen is soft.     Tenderness: There is no abdominal tenderness.  Musculoskeletal:     Right ankle: No swelling.     Left ankle: No swelling.  Skin:    General: Skin is warm.     Comments: Right arm erythema from hand up above the elbow.  The erythema  is starting to fade but still erythematous. Bilateral lower extremity erythematous in the shin area.  This is also fading from yesterday.  Neurological:     Mental Status: She is alert and oriented to person, place, and time.       Data Reviewed: Basic Metabolic Panel: Recent Labs  Lab 03/15/20 0857 03/16/20 0505 03/18/20 0437  NA 135 135 137  K 3.7 3.6 2.5*  CL 95* 97* 94*  CO2 31 30 30   GLUCOSE 123* 102* 96  BUN 23 23 15   CREATININE 0.92 0.82 0.73  CALCIUM 7.9* 7.6* 7.9*  MG  --   --  2.1   CBC: Recent Labs  Lab 03/15/20 0857 03/16/20 0645 03/17/20 0819  WBC 15.1* 14.9* 13.2*  HGB 10.8* 9.1* 9.9*  HCT 33.4* 27.7* 28.8*  MCV 86.5 85.8 82.5  PLT 488* 419* 441*   Cardiac Enzymes: Recent Labs  Lab 03/16/20 0505  CKTOTAL 119   BNP (last 3 results) Recent Labs    03/15/20 0857  BNP 573.7*      Recent Results (from the past 240 hour(s))  SARS Coronavirus 2 by RT PCR (hospital order, performed in [facility] hospital lab) Nasopharyngeal Nasopharyngeal Swab     Status: None   Collection Time: 03/15/20  9:41  AM   Specimen: Nasopharyngeal Swab  Result Value Ref Range Status   SARS Coronavirus 2 NEGATIVE NEGATIVE Final    Comment: (NOTE) SARS-CoV-2 target nucleic acids are NOT DETECTED.  The SARS-CoV-2 RNA is generally detectable in upper and lower respiratory specimens during the acute phase of infection. The lowest concentration of SARS-CoV-2 viral copies this assay can detect is 250 copies / mL. A negative result does not preclude SARS-CoV-2 infection and should not be used as the sole basis for treatment or other patient management decisions.  A negative result may occur with improper specimen collection / handling, submission of specimen other than nasopharyngeal swab, presence of viral mutation(s) within the areas targeted by this assay, and inadequate number of viral copies (<250 copies / mL). A negative result must be combined with  clinical observations, patient history, and epidemiological information.  Fact Sheet for Patients:   StrictlyIdeas.no  Fact Sheet for Healthcare Providers: BankingDealers.co.za  This test is not yet approved or  cleared by the [place] FDA and has been authorized for detection and/or diagnosis of SARS-CoV-2 by FDA under an Emergency Use Authorization (EUA).  This EUA will remain in effect (meaning this test can be used) for the duration of the COVID-19 declaration under Section 564(b)(1) of the Act, 21 U.S.C. section 360bbb-3(b)(1), unless the authorization is terminated or revoked sooner.  Performed at [facility], [address] [place], [place] [address]   Culture, blood (x 2)     Status: None (Preliminary result)   Collection Time: 03/15/20  9:41 AM   Specimen: BLOOD  Result Value Ref Range Status   Specimen Description BLOOD RIGHT ANTECUBITAL  Final   Special Requests   Final    BOTTLES DRAWN AEROBIC AND ANAEROBIC Blood Culture results may not be optimal due to an excessive volume of blood received in culture bottles   Culture   Final    NO GROWTH 3 DAYS Performed at [facility], [address], [place], [place] [address]    Report Status PENDING  Incomplete  Culture, blood (x 2)     Status: None (Preliminary result)   Collection Time: 03/15/20  9:41 AM   Specimen: BLOOD  Result Value Ref Range Status   Specimen Description BLOOD LEFT ANTECUBITAL  Final   Special Requests   Final    BOTTLES DRAWN AEROBIC AND ANAEROBIC Blood Culture results may not be optimal due to an excessive volume of blood received in culture bottles   Culture   Final    NO GROWTH 3 DAYS Performed at [facility], [address], [place], [place] [address]    Report Status PENDING  Incomplete  C Difficile Quick Screen w PCR reflex     Status: None   Collection Time: 03/16/20 11:34 AM   Specimen: STOOL  Result  Value Ref Range Status   C Diff antigen NEGATIVE NEGATIVE Final   C Diff toxin NEGATIVE NEGATIVE Final   C Diff interpretation No C. difficile detected.  Final    Comment: Performed at [facility], [address] [place], [place] [address]     Scheduled Meds: . ascorbic acid  500 mg Oral Daily  . aspirin EC  81 mg Oral Daily  . budesonide  9 mg Oral Daily  . calcium-vitamin D  1 tablet Oral TID  . Chlorhexidine Gluconate Cloth  6 each Topical Daily  . DULoxetine  20 mg Oral Daily  . enoxaparin (LOVENOX) injection  40 mg Subcutaneous  Q24H  . gabapentin  300 mg Oral TID  . hydrocortisone   Topical TID  . hydrocortisone  25 mg Rectal BID  . hydrocortisone cream  1 application Topical TID  . lidocaine  1 patch Transdermal Q24H  . liver oil-zinc oxide   Topical BID  . magnesium oxide  400 mg Oral Daily  . multivitamin with minerals   Oral Daily  . pantoprazole  40 mg Oral Daily  . potassium chloride  40 mEq Oral TID  . prednisoLONE acetate  1 drop Both Eyes QHS  . zinc sulfate  220 mg Oral Daily   Continuous Infusions: . cefTRIAXone (ROCEPHIN)  IV 1 g (03/17/20 2256)  . potassium chloride 10 mEq (03/18/20 1111)  . sodium chloride    . vancomycin      Assessment/Plan:  1. Clinical sepsis which was present on admission.  Patient also had acute metabolic encephalopathy.  Patient had leukocytosis and tachypnea.  Extensive right arm cellulitis.  Patient on Rocephin and vancomycin.  Stercoral colitis would be covered by Rocephin. 2. Severe hypokalemia today.  Replace potassium IV and orally 3. Hemorrhoids, anal fissure.  Stercoral colitis. Dr. Bary Castilla consult appreciated.  Steroid cream for rectal area. 4. Weakness.  Physical therapy recommends rehab 5. GERD without esophagitis on PPI 6. Crohn's disease on budesonide.  Patient has diarrhea.  As needed Imodium.  Hesitant on systemic steroids with infection. 7. Depression on Cymbalta 8. Urinary retention status post Foley  removal.  Continue to monitor urine output. 9. Irritation of vaginal and rectal area.  Desitin cream   Code Status:     Code Status Orders  (From admission, onward)         Start     Ordered   03/15/20 1525  Full code  Continuous        03/15/20 1527        Code Status History    Date Active Date Inactive Code Status Order ID Comments User Context   08/11/2018 1854 08/13/2018 2032 Full Code [id number]  Henreitta Leber, MD Inpatient   08/11/2018 1737 08/11/2018 1854 DNR [id number]  Saundra Shelling, MD Inpatient   08/11/2018 1735 08/11/2018 1736 Full Code [id number]  Saundra Shelling, MD Inpatient   12/23/2017 1137 12/25/2017 1808 Full Code [id number]  Dereck Leep, MD Inpatient   01/22/2017 1502 01/23/2017 1413 Full Code [id number]  Hillary Bow, MD ED   02/21/2015 1939 02/23/2015 1908 Full Code [id number]  Loletha Grayer, MD ED   Advance Care Planning Activity     Family Communication: Daughter at the bedside Disposition Plan: Status is: Inpatient  Dispo: The patient is from: Home              Anticipated d/c is to: Rehab              Anticipated d/c date is: Likely will need another couple days of IV antibiotics.  Right arm is still has extensive cellulitis              Patient currently treating for extensive cellulitis of right arm and bilateral lower extremities.  Antibiotics:  Rocephin  Vancomycin  Time spent: 26 minutes  [place]

## 2020-03-19 DIAGNOSIS — R062 Wheezing: Secondary | ICD-10-CM

## 2020-03-19 DIAGNOSIS — L03113 Cellulitis of right upper limb: Secondary | ICD-10-CM | POA: Diagnosis not present

## 2020-03-19 DIAGNOSIS — R531 Weakness: Secondary | ICD-10-CM | POA: Diagnosis not present

## 2020-03-19 DIAGNOSIS — K602 Anal fissure, unspecified: Secondary | ICD-10-CM | POA: Diagnosis not present

## 2020-03-19 DIAGNOSIS — A419 Sepsis, unspecified organism: Secondary | ICD-10-CM | POA: Diagnosis not present

## 2020-03-19 LAB — BASIC METABOLIC PANEL
Anion gap: 10 (ref 5–15)
BUN: 10 mg/dL (ref 8–23)
CO2: 28 mmol/L (ref 22–32)
Calcium: 8.1 mg/dL — ABNORMAL LOW (ref 8.9–10.3)
Chloride: 98 mmol/L (ref 98–111)
Creatinine, Ser: 0.62 mg/dL (ref 0.44–1.00)
GFR calc Af Amer: >60 mL/min (ref >60)
GFR calc non Af Amer: >60 mL/min (ref >60)
Glucose, Bld: 112 mg/dL — ABNORMAL HIGH (ref 70–99)
Potassium: 3.9 mmol/L (ref 3.5–5.1)
Sodium: 136 mmol/L (ref 135–145)

## 2020-03-19 LAB — CBC
HCT: 31.3 % — ABNORMAL LOW (ref 36.0–46.0)
Hemoglobin: 10.1 g/dL — ABNORMAL LOW (ref 12.0–15.0)
MCH: 28.1 pg (ref 26.0–34.0)
MCHC: 32.3 g/dL (ref 30.0–36.0)
MCV: 86.9 fL (ref 80.0–100.0)
Platelets: 517 10*3/uL — ABNORMAL HIGH (ref 150–400)
RBC: 3.6 MIL/uL — ABNORMAL LOW (ref 3.87–5.11)
RDW: 16.5 % — ABNORMAL HIGH (ref 11.5–15.5)
WBC: 9.7 10*3/uL (ref 4.0–10.5)
nRBC: 0 % (ref 0.0–0.2)

## 2020-03-19 MED ORDER — BUDESONIDE 0.5 MG/2ML IN SUSP
0.5000 mg | Freq: Two times a day (BID) | RESPIRATORY_TRACT | Status: DC
  Administered 2020-03-19 – 2020-03-24 (×10): 0.5 mg via RESPIRATORY_TRACT
  Filled 2020-03-19 (×10): qty 2

## 2020-03-19 MED ORDER — POTASSIUM CHLORIDE CRYS ER 20 MEQ PO TBCR
20.0000 meq | EXTENDED_RELEASE_TABLET | Freq: Two times a day (BID) | ORAL | Status: DC
  Administered 2020-03-19 – 2020-03-22 (×7): 20 meq via ORAL
  Filled 2020-03-19 (×7): qty 1

## 2020-03-19 MED ORDER — IPRATROPIUM-ALBUTEROL 0.5-2.5 (3) MG/3ML IN SOLN
3.0000 mL | Freq: Four times a day (QID) | RESPIRATORY_TRACT | Status: DC
  Administered 2020-03-19 – 2020-03-20 (×6): 3 mL via RESPIRATORY_TRACT
  Filled 2020-03-19 (×6): qty 3

## 2020-03-19 NOTE — Progress Notes (Signed)
   03/19/20 2012  Vitals  Temp 99.3 F (37.4 C)  Temp Source Oral  BP (!) 140/115  MAP (mmHg) 122  BP Location Left Arm  BP Method Automatic  Patient Position (if appropriate) Lying  Pulse Rate (!) 123  Pulse Rate Source Monitor  Resp 20  MEWS COLOR  MEWS Score Color Yellow  Oxygen Therapy  SpO2 94 %  O2 Device Room Air  MEWS Score  MEWS Temp 0  MEWS Systolic 0  MEWS Pulse 2  MEWS RR 0  MEWS LOC 0  MEWS Score 2   MEWS - yellow due to tachycardia. This seems to be an ongoing issue that MDs are aware. PRN Metoprolol given. Last given at 10AM today. Current HR between 90-98. Discussed with Charge RN, no need for MD escalation for now. Will increase VS frequency per protocol.

## 2020-03-19 NOTE — Progress Notes (Signed)
Patient ID: Joanne [name], female   DOB: 10-30-1930, 86 y.o.   MRN: [id number] [facility] PROGRESS NOTE  Joanne [name] YOV:[id number] DOB: 09/27/1930 DOA: 03/15/2020 PCP: Tracie Harrier, MD  HPI/Subjective: Patient admitted with a right arm cellulitis.  When I walked in the room today patient was coughing.  She just took her medications and may have aspirated.  She was coughing most of the time I was in the room.  A little audible wheeze heard.  Patient still has some pain in her right arm but moving it more today.  Objective: Vitals:   03/18/20 2101 03/19/20 0438  BP: (!) 133/76 (!) 148/80  Pulse: (!) 107 (!) 109  Resp: 17 16  Temp: 98.2 F (36.8 C) 98.1 F (36.7 C)  SpO2: 95% 96%    Intake/Output Summary (Last 24 hours) at 03/19/2020 1225 Last data filed at 03/19/2020 7412 Gross per 24 hour  Intake 542.83 ml  Output --  Net 542.83 ml   Filed Weights   03/15/20 0853  Weight: 78 kg    ROS: Review of Systems  Respiratory: Positive for cough and wheezing.   Cardiovascular: Negative for chest pain.  Gastrointestinal: Positive for diarrhea. Negative for abdominal pain.   Exam: Physical Exam HENT:     Nose: No mucosal edema.     Mouth/Throat:     Pharynx: No oropharyngeal exudate.  Eyes:     General: Lids are normal.     Pupils: Pupils are equal, round, and reactive to light.  Cardiovascular:     Rate and Rhythm: Regular rhythm. Tachycardia present.     Heart sounds: Normal heart sounds, S1 normal and S2 normal.  Pulmonary:     Breath sounds: Examination of the right-middle field reveals wheezing. Examination of the right-lower field reveals decreased breath sounds and wheezing. Examination of the left-lower field reveals decreased breath sounds. Decreased breath sounds and wheezing present. No rhonchi or rales.  Abdominal:     Palpations: Abdomen is soft.     Tenderness: There is no abdominal tenderness.  Musculoskeletal:     Right ankle: No swelling.      Left ankle: No swelling.  Skin:    General: Skin is warm.     Comments: Right arm erythema continuing to fade.  Still tender to touching the right arm.  Lower extremity bilateral erythema again also starting to fade.  Neurological:     Mental Status: She is alert and oriented to person, place, and time.       Data Reviewed: Basic Metabolic Panel: Recent Labs  Lab 03/15/20 0857 03/16/20 0505 03/18/20 0437 03/19/20 0553  NA 135 135 137 136  K 3.7 3.6 2.5* 3.9  CL 95* 97* 94* 98  CO2 31 30 30 28   GLUCOSE 123* 102* 96 112*  BUN 23 23 15 10   CREATININE 0.92 0.82 0.73 0.62  CALCIUM 7.9* 7.6* 7.9* 8.1*  MG  --   --  2.1  --    CBC: Recent Labs  Lab 03/15/20 0857 03/16/20 0645 03/17/20 0819 03/19/20 0553  WBC 15.1* 14.9* 13.2* 9.7  HGB 10.8* 9.1* 9.9* 10.1*  HCT 33.4* 27.7* 28.8* 31.3*  MCV 86.5 85.8 82.5 86.9  PLT 488* 419* 441* 517*   Cardiac Enzymes: Recent Labs  Lab 03/16/20 0505  CKTOTAL 119   BNP (last 3 results) Recent Labs    03/15/20 0857  BNP 573.7*      Recent Results (from the past 240 hour(s))  SARS Coronavirus 2  by RT PCR (hospital order, performed in [facility] hospital lab) Nasopharyngeal Nasopharyngeal Swab     Status: None   Collection Time: 03/15/20  9:41 AM   Specimen: Nasopharyngeal Swab  Result Value Ref Range Status   SARS Coronavirus 2 NEGATIVE NEGATIVE Final    Comment: (NOTE) SARS-CoV-2 target nucleic acids are NOT DETECTED.  The SARS-CoV-2 RNA is generally detectable in upper and lower respiratory specimens during the acute phase of infection. The lowest concentration of SARS-CoV-2 viral copies this assay can detect is 250 copies / mL. A negative result does not preclude SARS-CoV-2 infection and should not be used as the sole basis for treatment or other patient management decisions.  A negative result may occur with improper specimen collection / handling, submission of specimen other than nasopharyngeal swab, presence of  viral mutation(s) within the areas targeted by this assay, and inadequate number of viral copies (<250 copies / mL). A negative result must be combined with clinical observations, patient history, and epidemiological information.  Fact Sheet for Patients:   StrictlyIdeas.no  Fact Sheet for Healthcare Providers: BankingDealers.co.za  This test is not yet approved or  cleared by the [place] FDA and has been authorized for detection and/or diagnosis of SARS-CoV-2 by FDA under an Emergency Use Authorization (EUA).  This EUA will remain in effect (meaning this test can be used) for the duration of the COVID-19 declaration under Section 564(b)(1) of the Act, 21 U.S.C. section 360bbb-3(b)(1), unless the authorization is terminated or revoked sooner.  Performed at [facility], [address] [place], [place] [address]   Culture, blood (x 2)     Status: None (Preliminary result)   Collection Time: 03/15/20  9:41 AM   Specimen: BLOOD  Result Value Ref Range Status   Specimen Description BLOOD RIGHT ANTECUBITAL  Final   Special Requests   Final    BOTTLES DRAWN AEROBIC AND ANAEROBIC Blood Culture results may not be optimal due to an excessive volume of blood received in culture bottles   Culture   Final    NO GROWTH 4 DAYS Performed at [facility], [address], [place], [place] [address]    Report Status PENDING  Incomplete  Culture, blood (x 2)     Status: None (Preliminary result)   Collection Time: 03/15/20  9:41 AM   Specimen: BLOOD  Result Value Ref Range Status   Specimen Description BLOOD LEFT ANTECUBITAL  Final   Special Requests   Final    BOTTLES DRAWN AEROBIC AND ANAEROBIC Blood Culture results may not be optimal due to an excessive volume of blood received in culture bottles   Culture   Final    NO GROWTH 4 DAYS Performed at [facility], [address], [place], [place] [address]     Report Status PENDING  Incomplete  C Difficile Quick Screen w PCR reflex     Status: None   Collection Time: 03/16/20 11:34 AM   Specimen: STOOL  Result Value Ref Range Status   C Diff antigen NEGATIVE NEGATIVE Final   C Diff toxin NEGATIVE NEGATIVE Final   C Diff interpretation No C. difficile detected.  Final    Comment: Performed at [facility], [address] [place], [place] [address]     Scheduled Meds: . ascorbic acid  500 mg Oral Daily  . aspirin EC  81 mg Oral Daily  . budesonide  9 mg Oral Daily  . budesonide (PULMICORT) nebulizer solution  0.5 mg  Nebulization BID  . calcium-vitamin D  1 tablet Oral TID  . Chlorhexidine Gluconate Cloth  6 each Topical Daily  . DULoxetine  20 mg Oral Daily  . enoxaparin (LOVENOX) injection  40 mg Subcutaneous Q24H  . gabapentin  300 mg Oral TID  . hydrocortisone   Topical TID  . hydrocortisone  25 mg Rectal BID  . hydrocortisone cream  1 application Topical TID  . ipratropium-albuterol  3 mL Nebulization Q6H  . lidocaine  1 patch Transdermal Q24H  . liver oil-zinc oxide   Topical BID  . magnesium oxide  400 mg Oral Daily  . multivitamin with minerals   Oral Daily  . pantoprazole  40 mg Oral Daily  . potassium chloride  20 mEq Oral BID  . prednisoLONE acetate  1 drop Both Eyes QHS  . zinc sulfate  220 mg Oral Daily   Continuous Infusions: . cefTRIAXone (ROCEPHIN)  IV Stopped (03/18/20 2315)  . sodium chloride    . vancomycin Stopped (03/18/20 1731)    Assessment/Plan:  1. Clinical sepsis, present on admission.  Patient also had acute metabolic encephalopathy.  White blood cell count now in normal range.  Extensive right arm cellulitis has improved after adding vancomycin the other day.  Continue Rocephin also. 2. Wheeze.  Likely aspiration of meds in applesauce.  Start nebulizer treatments.  Patient declined x-ray and also systemic steroids.   3. Severe hypokalemia yesterday.  This was replaced yesterday. Takes chronic  potassium supplementation.  4. Hemorrhoids, anal fissure, stercoral colitis.  Continue steroid cream. 5. Weakness.  Physical therapy recommends rehab.  6. GERD without esophagitis on PPI. 7. Crohn's disease on budesonide.  Patient has chronic diarrhea.  As needed Imodium.  8. Depression.  Continue Cymbalta. 9. Irritation of vaginal and rectal area.  Continue Desitin cream.    Code Status:     Code Status Orders  (From admission, onward)         Start     Ordered   03/15/20 1525  Full code  Continuous        03/15/20 1527        Code Status History    Date Active Date Inactive Code Status Order ID Comments User Context   08/11/2018 1854 08/13/2018 2032 Full Code [id number]  Henreitta Leber, MD Inpatient   08/11/2018 1737 08/11/2018 1854 DNR [id number]  Saundra Shelling, MD Inpatient   08/11/2018 1735 08/11/2018 1736 Full Code [id number]  Saundra Shelling, MD Inpatient   12/23/2017 1137 12/25/2017 1808 Full Code [id number]  Dereck Leep, MD Inpatient   01/22/2017 1502 01/23/2017 1413 Full Code [id number]  Hillary Bow, MD ED   02/21/2015 1939 02/23/2015 1908 Full Code [id number]  Loletha Grayer, MD ED   Advance Care Planning Activity     Family Communication: Daughter at the bedside Disposition Plan: Status is: Inpatient  Dispo: The patient is from: Home              Anticipated d/c is to: Rehab              Anticipated d/c date is: Potential disposition 03/20/2020 versus 03/21/2020.              Patient currently treating for extensive cellulitis of right arm and bilateral lower extremities.  Antibiotics:  Rocephin  Vancomycin  Time spent: 27 minutes  [place]

## 2020-03-20 ENCOUNTER — Inpatient Hospital Stay: Payer: Medicare Other

## 2020-03-20 ENCOUNTER — Inpatient Hospital Stay
Admit: 2020-03-20 | Discharge: 2020-03-20 | Disposition: A | Discharge status: Home / Self Care | Payer: Medicare Other | Attending: Internal Medicine | Admitting: Internal Medicine

## 2020-03-20 DIAGNOSIS — I4891 Unspecified atrial fibrillation: Secondary | ICD-10-CM | POA: Diagnosis not present

## 2020-03-20 DIAGNOSIS — L03113 Cellulitis of right upper limb: Secondary | ICD-10-CM | POA: Diagnosis not present

## 2020-03-20 DIAGNOSIS — A419 Sepsis, unspecified organism: Secondary | ICD-10-CM | POA: Diagnosis not present

## 2020-03-20 DIAGNOSIS — R531 Weakness: Secondary | ICD-10-CM | POA: Diagnosis not present

## 2020-03-20 LAB — CULTURE, BLOOD (ROUTINE X 2)
Culture: NO GROWTH
Culture: NO GROWTH

## 2020-03-20 LAB — CREATININE, SERUM
Creatinine, Ser: 0.64 mg/dL (ref 0.44–1.00)
GFR calc Af Amer: >60 mL/min (ref >60)
GFR calc non Af Amer: >60 mL/min (ref >60)

## 2020-03-20 MED ORDER — DILTIAZEM HCL ER COATED BEADS 120 MG PO CP24: 120.0000 mg | ORAL_CAPSULE | Freq: Every day | ORAL | Status: DC

## 2020-03-20 MED ORDER — METOPROLOL TARTRATE 5 MG/5ML IV SOLN: 5.0000 mg | INTRAVENOUS | Status: DC | PRN

## 2020-03-20 MED ORDER — METOPROLOL TARTRATE 25 MG PO TABS
25.0000 mg | ORAL_TABLET | Freq: Two times a day (BID) | ORAL | Status: DC
  Administered 2020-03-20 – 2020-03-24 (×9): 25 mg via ORAL
  Filled 2020-03-20 (×10): qty 1

## 2020-03-20 MED ORDER — SODIUM CHLORIDE 0.9 % IV SOLN
INTRAVENOUS | Status: DC | PRN
  Administered 2020-03-20: 250 mL via INTRAVENOUS

## 2020-03-20 MED ORDER — DILTIAZEM HCL 30 MG PO TABS
30.0000 mg | ORAL_TABLET | Freq: Four times a day (QID) | ORAL | Status: DC
  Administered 2020-03-20 – 2020-03-22 (×8): 30 mg via ORAL
  Filled 2020-03-20 (×9): qty 1

## 2020-03-20 MED ORDER — APIXABAN 5 MG PO TABS
5.0000 mg | ORAL_TABLET | Freq: Two times a day (BID) | ORAL | Status: DC
  Administered 2020-03-20 – 2020-03-24 (×10): 5 mg via ORAL
  Filled 2020-03-20 (×10): qty 1

## 2020-03-20 NOTE — Progress Notes (Signed)
ANTICOAGULATION CONSULT NOTE - Initial Consult  Pharmacy Consult for Apixaban Indication: atrial fibrillation  Allergies  Allergen Reactions  . Oxycodone Other (See Comments)    "makes her crazy"    Patient Measurements: Height: 5' (152.4 cm) Weight: 78 kg (172 lb) IBW/kg (Calculated) : 45.5  Vital Signs: Temp: 98.2 F (36.8 C) (07/25 0820) Temp Source: Oral (07/25 0820) BP: 131/75 (07/25 0820) Pulse Rate: 111 (07/25 0820)  Labs: Recent Labs    03/18/20 0437 03/19/20 0553 03/20/20 0609  HGB  --  10.1*  --   HCT  --  31.3*  --   PLT  --  517*  --   CREATININE 0.73 0.62 0.64    Estimated Creatinine Clearance: 46.6 mL/min (by C-G formula based on SCr of 0.64 mg/dL).   Medical History: Past Medical History:  Diagnosis Date  . Anemia, unspecified    B12 and iron deficiency   . Arthritis   . B12 deficiency   . Collagen vascular disease ([place])   . Crohn disease ([place])   . Crohn's disease ([place])    followed by Dr. Vira Agar with serial colonoscopies  . Dyspnea   . Fuchs' endothelial dystrophy   . GERD (gastroesophageal reflux disease)   . History of bone density study 09/01/2012  . History of spinal fracture   . Hyperlipidemia, unspecified   . Hypertension   . Hypokalemia   . Hypomagnesemia   . Osteoporosis   . Other dysphagia 01/24/2017  . Pneumonia    1980's  . Protein calorie malnutrition ([place])   . Retinal detachment   . Right shoulder injury 05/2002   treated conservatively  . Trigger finger   . Zenker diverticulum 09/25/2016     Assessment: 86 yo female to start on apixaban for AFib.    Goal of Therapy:  Monitor platelets by anticoagulation protocol: Yes   Plan:  Apixaban 5 mg PO BID SCr and CBC at least every three days per policy  Pharmacy will continue to follow.   Rayna Sexton L 03/20/2020,10:22 AM

## 2020-03-20 NOTE — Progress Notes (Signed)
*  PRELIMINARY RESULTS* Echocardiogram 2D Echocardiogram has been performed.  Joanne [name] 03/20/2020, 4:30 PM

## 2020-03-20 NOTE — Progress Notes (Signed)
Patient ID: Joanne [name], female   DOB: Feb 15, 1931, 86 y.o.   MRN: [id number] [facility] PROGRESS NOTE  Joanne [name] [id number]:[id number] DOB: 1930-09-07 DOA: 03/15/2020 PCP: Tracie Harrier, MD  HPI/Subjective: Patient had low-grade temp last night of 99 and this morning.  Patient states that she cannot take a deep breath when I asked her.  Still having some right arm pain but able to move it.  Objective: Vitals:   03/20/20 0327 03/20/20 0820  BP: (!) 136/58 (!) 131/75  Pulse: 101 (!) 111  Resp: 17 18  Temp: 99 F (37.2 C) 98.2 F (36.8 C)  SpO2: 94% 100%    Intake/Output Summary (Last 24 hours) at 03/20/2020 1220 Last data filed at 03/20/2020 0300 Gross per 24 hour  Intake 460 ml  Output --  Net 460 ml   Filed Weights   03/15/20 0853  Weight: 78 kg    ROS: Review of Systems  Respiratory: Positive for shortness of breath.   Cardiovascular: Negative for chest pain.  Gastrointestinal: Positive for diarrhea. Negative for abdominal pain.  Musculoskeletal: Positive for joint pain.   Exam: Physical Exam HENT:     Nose: No mucosal edema.     Mouth/Throat:     Pharynx: No oropharyngeal exudate.  Eyes:     General: Lids are normal.     Conjunctiva/sclera: Conjunctivae normal.     Pupils: Pupils are equal, round, and reactive to light.  Cardiovascular:     Rate and Rhythm: Tachycardia present. Rhythm irregularly irregular.     Heart sounds: Normal heart sounds, S1 normal and S2 normal.  Pulmonary:     Breath sounds: Examination of the right-lower field reveals decreased breath sounds and rhonchi. Examination of the left-lower field reveals decreased breath sounds and rhonchi. Decreased breath sounds and rhonchi present. No wheezing or rales.  Abdominal:     Palpations: Abdomen is soft.     Tenderness: There is no abdominal tenderness.  Skin:    General: Skin is warm.     Comments: Right arm erythema still present but slowly improving over time. Bilateral lower  extremity erythema has also faded a little bit.  Neurological:     Mental Status: She is alert and oriented to person, place, and time.       Data Reviewed: Basic Metabolic Panel: Recent Labs  Lab 03/15/20 0857 03/16/20 0505 03/18/20 0437 03/19/20 0553 03/20/20 0609  NA 135 135 137 136  --   K 3.7 3.6 2.5* 3.9  --   CL 95* 97* 94* 98  --   CO2 31 30 30 28   --   GLUCOSE 123* 102* 96 112*  --   BUN 23 23 15 10   --   CREATININE 0.92 0.82 0.73 0.62 0.64  CALCIUM 7.9* 7.6* 7.9* 8.1*  --   MG  --   --  2.1  --   --    CBC: Recent Labs  Lab 03/15/20 0857 03/16/20 0645 03/17/20 0819 03/19/20 0553  WBC 15.1* 14.9* 13.2* 9.7  HGB 10.8* 9.1* 9.9* 10.1*  HCT 33.4* 27.7* 28.8* 31.3*  MCV 86.5 85.8 82.5 86.9  PLT 488* 419* 441* 517*   Cardiac Enzymes: Recent Labs  Lab 03/16/20 0505  CKTOTAL 119   BNP (last 3 results) Recent Labs    03/15/20 0857  BNP 573.7*      Recent Results (from the past 240 hour(s))  SARS Coronavirus 2 by RT PCR (hospital order, performed in [facility] hospital lab) Nasopharyngeal  Nasopharyngeal Swab     Status: None   Collection Time: 03/15/20  9:41 AM   Specimen: Nasopharyngeal Swab  Result Value Ref Range Status   SARS Coronavirus 2 NEGATIVE NEGATIVE Final    Comment: (NOTE) SARS-CoV-2 target nucleic acids are NOT DETECTED.  The SARS-CoV-2 RNA is generally detectable in upper and lower respiratory specimens during the acute phase of infection. The lowest concentration of SARS-CoV-2 viral copies this assay can detect is 250 copies / mL. A negative result does not preclude SARS-CoV-2 infection and should not be used as the sole basis for treatment or other patient management decisions.  A negative result may occur with improper specimen collection / handling, submission of specimen other than nasopharyngeal swab, presence of viral mutation(s) within the areas targeted by this assay, and inadequate number of viral copies (<250 copies /  mL). A negative result must be combined with clinical observations, patient history, and epidemiological information.  Fact Sheet for Patients:   StrictlyIdeas.no  Fact Sheet for Healthcare Providers: BankingDealers.co.za  This test is not yet approved or  cleared by the [place] FDA and has been authorized for detection and/or diagnosis of SARS-CoV-2 by FDA under an Emergency Use Authorization (EUA).  This EUA will remain in effect (meaning this test can be used) for the duration of the COVID-19 declaration under Section 564(b)(1) of the Act, 21 U.S.C. section 360bbb-3(b)(1), unless the authorization is terminated or revoked sooner.  Performed at [facility], [address] [place], [place] [address]   Culture, blood (x 2)     Status: None   Collection Time: 03/15/20  9:41 AM   Specimen: BLOOD  Result Value Ref Range Status   Specimen Description BLOOD RIGHT ANTECUBITAL  Final   Special Requests   Final    BOTTLES DRAWN AEROBIC AND ANAEROBIC Blood Culture results may not be optimal due to an excessive volume of blood received in culture bottles   Culture   Final    NO GROWTH 5 DAYS Performed at [facility], [address] [place], [place] [address]    Report Status 03/20/2020 FINAL  Final  Culture, blood (x 2)     Status: None   Collection Time: 03/15/20  9:41 AM   Specimen: BLOOD  Result Value Ref Range Status   Specimen Description BLOOD LEFT ANTECUBITAL  Final   Special Requests   Final    BOTTLES DRAWN AEROBIC AND ANAEROBIC Blood Culture results may not be optimal due to an excessive volume of blood received in culture bottles   Culture   Final    NO GROWTH 5 DAYS Performed at [facility], [address], [place], [place] [address]    Report Status 03/20/2020 FINAL  Final  C Difficile Quick Screen w PCR reflex     Status: None   Collection Time: 03/16/20 11:34 AM   Specimen: STOOL   Result Value Ref Range Status   C Diff antigen NEGATIVE NEGATIVE Final   C Diff toxin NEGATIVE NEGATIVE Final   C Diff interpretation No C. difficile detected.  Final    Comment: Performed at [facility], [address] [place], [place] [address]     Studies: [facility] Chest [place] 1 View  Result Date: 03/20/2020 CLINICAL DATA:  Cough and shortness of breath beginning yesterday. EXAM: PORTABLE CHEST 1 VIEW COMPARISON:  04/01/2017 FINDINGS: Lordotic technique is demonstrated. Lungs are hypoinflated with hazy opacification over the left base/retrocardiac region which may be due to effusion/atelectasis  versus infection. Linear atelectasis over the left midlung. Mild prominence of the central perihilar markings. Mild stable cardiomegaly. Remainder of the exam is unchanged. IMPRESSION: 1. Left base opacification which may be due to atelectasis/effusion versus infection. Linear atelectasis left midlung. 2.  Stable cardiomegaly with possible minimal vascular congestion. Electronically Signed   By: Marin Olp M.D.   On: 03/20/2020 11:17    Scheduled Meds: . apixaban  5 mg Oral BID  . ascorbic acid  500 mg Oral Daily  . budesonide  9 mg Oral Daily  . budesonide (PULMICORT) nebulizer solution  0.5 mg Nebulization BID  . calcium-vitamin D  1 tablet Oral TID  . diltiazem  30 mg Oral Q6H  . DULoxetine  20 mg Oral Daily  . gabapentin  300 mg Oral TID  . hydrocortisone   Topical TID  . hydrocortisone  25 mg Rectal BID  . hydrocortisone cream  1 application Topical TID  . ipratropium-albuterol  3 mL Nebulization Q6H  . lidocaine  1 patch Transdermal Q24H  . liver oil-zinc oxide   Topical BID  . magnesium oxide  400 mg Oral Daily  . metoprolol tartrate  25 mg Oral BID  . multivitamin with minerals   Oral Daily  . pantoprazole  40 mg Oral Daily  . potassium chloride  20 mEq Oral BID  . prednisoLONE acetate  1 drop Both Eyes QHS  . zinc sulfate  220 mg Oral Daily   Continuous Infusions: .  cefTRIAXone (ROCEPHIN)  IV Stopped (03/19/20 2047)  . sodium chloride    . vancomycin 750 mg (03/19/20 1512)    Assessment/Plan:  1. Atrial fibrillation with rapid ventricular response.  Heart rates up in the 130s to 140s when I saw her this morning.  As needed IV metoprolol.  Started Cardizem and also oral metoprolol.  Pharmacy consult for Eliquis.  Case discussed with Dr. Ubaldo Glassing cardiology and appreciate consultation.  Echocardiogram ordered. 2. Clinical sepsis, present on admission.  Patient also had acute metabolic encephalopathy.  Extensive right arm cellulitis.  Right arm still erythematous but starting to fade.  Continue vancomycin and patient also on Rocephin 3. Wheeze yesterday this has improved.  Chest x-ray does not show aspiration. 4. Hypokalemia.  Continue chronic potassium supplementation.  Check labs tomorrow. 5. Hemorrhoids, anal fissure and stercoral colitis.  Continue steroid cream. 6. Weakness.  Physical therapy recommends rehab 7. Crohn's disease on budesonide.  Patient has chronic diarrhea.  As needed Imodium 8. Depression on Cymbalta    Code Status:     Code Status Orders  (From admission, onward)         Start     Ordered   03/15/20 1525  Full code  Continuous        03/15/20 1527        Code Status History    Date Active Date Inactive Code Status Order ID Comments User Context   08/11/2018 1854 08/13/2018 2032 Full Code [id number]  Henreitta Leber, MD Inpatient   08/11/2018 1737 08/11/2018 1854 DNR [id number]  Saundra Shelling, MD Inpatient   08/11/2018 1735 08/11/2018 1736 Full Code [id number]  Saundra Shelling, MD Inpatient   12/23/2017 1137 12/25/2017 1808 Full Code [id number]  Dereck Leep, MD Inpatient   01/22/2017 1502 01/23/2017 1413 Full Code [id number]  Hillary Bow, MD ED   02/21/2015 1939 02/23/2015 1908 Full Code [id number]  Loletha Grayer, MD ED   Advance Care Planning Activity     Family Communication: Daughter at the bedside  Disposition Plan:  Status is: Inpatient  Dispo: The patient is from: Home              Anticipated d/c is to: Rehab              Anticipated d/c date is: Potentially 03/22/2020              Patient currently now having atrial fibrillation with rapid ventricular response.  Need to settle down heart rate and ensure infection is improving.  Consultants:  Cardiology  Antibiotics:  Rocephin  Vancomycin  Time spent: 29 minutes, case discussed with cardiology  [place]

## 2020-03-20 NOTE — Progress Notes (Signed)
Cardiology Consultation Note    Patient ID: Joanne [name], MRN: [id number], DOB/AGE: 29-Sep-1930 87 y.o. Admit date: 03/15/2020   Date of Consult: 03/20/2020 Primary Physician: Tracie Harrier, MD Primary Cardiologist: Dr. Nehemiah Massed  Chief Complaint: cellulitis Reason for Consultation: afib Requesting MD: Dr. Leslye Peer  HPI: Joanne [name] is a 86 y.o. female with history of Crohn's disease, hyperlipidemia, hypertension who was admitted after presenting to the emergency room with complaints of pain erythema and swelling of the right arm and legs.  She had a BNP of 573 with a white blood cell count of 15.1.  EKG on admission showed sinus rhythm.  She has subsequently developed atrial fibrillation with RVR.  She was started on Cardizem as well as metoprolol.  She is fairly asymptomatic.  She is hemodynamically stable.  She had been seen by cardiology several years ago for shortness of breath.  No evidence of atrial fibrillation.  Echocardiogram in 2019 showed preserved LV function.  She had trivial MR and TR.  No stenotic disease.  Functional study at that time was negative for ischemia.  Her renal function is normal.  Electrolytes showed a serum potassium 2 days ago of 2.5.  This is improved to 3.9 yesterday.  Laboratories pending today.  Past Medical History:  Diagnosis Date  . Anemia, unspecified    B12 and iron deficiency   . Arthritis   . B12 deficiency   . Collagen vascular disease ([place])   . Crohn disease ([place])   . Crohn's disease ([place])    followed by Dr. Vira Agar with serial colonoscopies  . Dyspnea   . Fuchs' endothelial dystrophy   . GERD (gastroesophageal reflux disease)   . History of bone density study 09/01/2012  . History of spinal fracture   . Hyperlipidemia, unspecified   . Hypertension   . Hypokalemia   . Hypomagnesemia   . Osteoporosis   . Other dysphagia 01/24/2017  . Pneumonia    1980's  . Protein calorie malnutrition ([place])   . Retinal detachment   . Right  shoulder injury 05/2002   treated conservatively  . Trigger finger   . Zenker diverticulum 09/25/2016      Surgical History:  Past Surgical History:  Procedure Laterality Date  . APPENDECTOMY  1949  . CATARACT EXTRACTION EXTRACAPSULAR Right 05/10/2015   Procedure: EXTRACTION CATARACT EXTRACAPSULAR W/INSERTION INTRAOCULAR PROSTHESIS; Surgeon: Doyce Para, MD; [facility]; Service: Ophthalmology; Laterality: Right;  . CHOLECYSTECTOMY  1992  . COLONOSCOPY     03/24/1992, 03/11/1998, 07/08/2002, 05/08/2006, 01/31/2012   PH Crohns disease; no repeat due to age per RTE (dw)  . CORNEAL TRANSPLANT Left 07/08/2012   Procedure: CORNEAL TRANSPLANT DSAEK ** tissue ordered OSI 06/27/2012; Surgeon: Doyce Para, MD; Location: [facility]; Service: Ophthalmology;  . ESOPHAGOGASTRODUODENOSCOPY     05/01/2005, 01/31/2012 ; No repeat per RTE  . ESOPHAGOGASTRODUODENOSCOPY (EGD) WITH PROPOFOL N/A 05/30/2018   Procedure: ESOPHAGOGASTRODUODENOSCOPY (EGD) WITH PROPOFOL;  Surgeon: Manya Silvas, MD;  Location: [facility] ENDOSCOPY;  Service: Endoscopy;  Laterality: N/A;  . EYE SURGERY  2012, 2013   5 total  . JOINT REPLACEMENT Right 01-28-13  . KYPHOPLASTY N/A 08/12/2018   Procedure: KYPHOPLASTY L3;  Surgeon: Hessie Knows, MD;  Location: [facility] ORS;  Service: Orthopedics;  Laterality: N/A;  . LENSECTOMY PHACOFRAGMENTATION WITH ASPIRATION Left 07/25/2011  . TONSILLECTOMY  1950  . TOTAL HIP ARTHROPLASTY Left 12/23/2017   Procedure: TOTAL HIP ARTHROPLASTY;  Surgeon: Dereck Leep, MD;  Location: [facility] ORS;  Service:  Orthopedics;  Laterality: Left;  Marland Kitchen VITREOUS RETINAL SURGERY Left 11/07/2011   PPV/SO  . VITREOUS RETINAL SURGERY Left 04/18/2011   TPPV/TRP/SO/SB/EL/C3F8     Home Meds: Prior to Admission medications   Medication Sig Start Date End Date Taking? Authorizing Provider  acetaminophen (TYLENOL) 500 MG tablet Take 500 mg by mouth every 8 (eight) hours as needed for mild pain or moderate pain.    Yes  [provider]  ascorbic acid (VITAMIN C) 500 MG tablet Take 500 mg by mouth daily.   Yes [provider]  aspirin EC 81 MG tablet Take 81 mg by mouth daily. Swallow whole.   Yes [provider]  budesonide (ENTOCORT EC) 3 MG 24 hr capsule Take 9 mg by mouth daily.    Yes [provider]  calcium-vitamin D (OSCAL WITH D) 500-200 MG-UNIT TABS tablet Take 1 tablet by mouth 3 (three) times daily.    Yes [provider]  chlorthalidone (HYGROTON) 25 MG tablet Take 25 mg by mouth daily. 02/10/20  Yes [provider]  cholecalciferol (VITAMIN D) 1000 units tablet Take 3 tablets by mouth once daily   Yes [provider]  Cyanocobalamin (VITAMIN B-12) 5000 MCG LOZG Subl Place 1 tablet under the tongue once dailY   Yes [provider]  DULoxetine (CYMBALTA) 20 MG capsule Take 20 mg by mouth daily.   Yes [provider]  gabapentin (NEURONTIN) 300 MG capsule TAKE 1 CAPSULE BY MOUTH TWICE DAILY AND 2 CAPSULES NIGHTLY AS DIRECTED   Yes [provider]  hydrocortisone (ANUSOL-HC) 2.5 % rectal cream Apply topically 3 (three) times daily. 03/12/20  Yes [provider]  lidocaine (LIDODERM) 5 % Apply topically to right knee in the AM and Remove & Discard patch at night within 12 hours or as directed by MD for uncontrolled knee pain  08/23/18  Yes [provider]  loperamide (LOPERAMIDE A-D) 2 MG tablet Take 2 mg by mouth 3 (three) times daily.  08/14/18  Yes [provider]  magnesium oxide (MAG-OX) 400 (241.3 Mg) MG tablet Take 400 mg by mouth daily.   Yes [provider]  Multiple Vitamin (MULTIVITAMIN) tablet Take 1 tablet by mouth daily.   Yes [provider]  NEXIUM 40 MG capsule Take 40 mg by mouth daily.  08/26/13  Yes [provider]  potassium chloride SA (K-DUR,KLOR-CON) 20 MEQ tablet Take 20 mEq by mouth 2 (two) times daily.   Yes [provider]   prednisoLONE acetate (PRED FORTE) 1 % ophthalmic suspension Place 1 drop into both eyes at bedtime.   Yes [provider]  zinc sulfate (ZINC-220) 220 (50 Zn) MG capsule Take 220 mg by mouth daily.   Yes [provider]    Inpatient Medications:  . apixaban  5 mg Oral BID  . ascorbic acid  500 mg Oral Daily  . budesonide  9 mg Oral Daily  . budesonide (PULMICORT) nebulizer solution  0.5 mg Nebulization BID  . calcium-vitamin D  1 tablet Oral TID  . Chlorhexidine Gluconate Cloth  6 each Topical Daily  . diltiazem  120 mg Oral Daily  . DULoxetine  20 mg Oral Daily  . gabapentin  300 mg Oral TID  . hydrocortisone   Topical TID  . hydrocortisone  25 mg Rectal BID  . hydrocortisone cream  1 application Topical TID  . ipratropium-albuterol  3 mL Nebulization Q6H  . lidocaine  1 patch Transdermal Q24H  . liver oil-zinc  oxide   Topical BID  . magnesium oxide  400 mg Oral Daily  . metoprolol tartrate  25 mg Oral BID  . multivitamin with minerals   Oral Daily  . pantoprazole  40 mg Oral Daily  . potassium chloride  20 mEq Oral BID  . prednisoLONE acetate  1 drop Both Eyes QHS  . zinc sulfate  220 mg Oral Daily   . cefTRIAXone (ROCEPHIN)  IV Stopped (03/19/20 2047)  . sodium chloride    . vancomycin 750 mg (03/19/20 1512)    Allergies:  Allergies  Allergen Reactions  . Oxycodone Other (See Comments)    "makes her crazy"    Social History   Socioeconomic History  . Marital status: Widowed    Spouse name: Not on file  . Number of children: 2  . Years of education: college  . Highest education level: Not on file  Occupational History  . Occupation: retired    Comment: Pharmacist, hospital  Tobacco Use  . Smoking status: Former Smoker    Packs/day: 0.50    Years: 35.00    Pack years: 17.50    Types: Cigarettes    Quit date: 01/29/1991    Years since quitting: 29.1  . Smokeless tobacco: Never Used  Vaping Use  . Vaping Use: Never used  Substance and Sexual Activity   . Alcohol use: Yes    Comment: 3x/yr  . Drug use: No  . Sexual activity: Never  Other Topics Concern  . Not on file  Social History Narrative   Widowed   Former smoker   Occasional drink 3 x a year   2 children    Full Code   Social Determinants of Health   Financial Resource Strain:   . Difficulty of Paying Living Expenses:   Food Insecurity:   . Worried About Charity fundraiser in the Last Year:   . Arboriculturist in the Last Year:   Transportation Needs:   . Film/video editor (Medical):   Marland Kitchen Lack of Transportation (Non-Medical):   Physical Activity:   . Days of Exercise per Week:   . Minutes of Exercise per Session:   Stress:   . Feeling of Stress :   Social Connections:   . Frequency of Communication with Friends and Family:   . Frequency of Social Gatherings with Friends and Family:   . Attends Religious Services:   . Active Member of Clubs or Organizations:   . Attends Archivist Meetings:   Marland Kitchen Marital Status:   Intimate Partner Violence:   . Fear of Current or Ex-Partner:   . Emotionally Abused:   Marland Kitchen Physically Abused:   . Sexually Abused:      Family History  Problem Relation Age of Onset  . Hypertension Mother   . Stroke Father   . Hypertension Father   . Arthritis Other   . Prostate cancer Other   . Psoriasis Other   . Stroke Other   . Thyroid disease Other   . Anesthesia problems Neg Hx   . Diabetes Neg Hx   . Glaucoma Neg Hx   . Macular degeneration Neg Hx      Review of Systems: A 12-system review of systems was performed and is negative except as noted in the HPI.  Labs: No results for input(s): CKTOTAL, CKMB, TROPONINI in the last 72 hours. Lab Results  Component Value Date   WBC 9.7 03/19/2020   HGB 10.1 (L) 03/19/2020  HCT 31.3 (L) 03/19/2020   MCV 86.9 03/19/2020   PLT 517 (H) 03/19/2020    Recent Labs  Lab 03/19/20 0553 03/19/20 0553 03/20/20 0609  NA 136  --   --   K 3.9  --   --   CL 98  --   --   CO2  28  --   --   BUN 10  --   --   CREATININE 0.62   < > 0.64  CALCIUM 8.1*  --   --   GLUCOSE 112*  --   --    < > = values in this interval not displayed.   No results found for: CHOL, HDL, LDLCALC, TRIG No results found for: DDIMER  Radiology/Studies:  CT Head Wo Contrast  Result Date: 03/15/2020 CLINICAL DATA:  Head trauma, headache. Additional history provided: Weakness and urinary retention for 4 days. EXAM: CT HEAD WITHOUT CONTRAST TECHNIQUE: Contiguous axial images were obtained from the base of the skull through the vertex without intravenous contrast. COMPARISON:  Prior head CT examination 02/23/2018 and earlier FINDINGS: Brain: Stable, mild-to-moderate generalized parenchymal atrophy. Stable, minimal ill-defined hypoattenuation within the cerebral white matter is nonspecific, but consistent with chronic small vessel ischemic disease. There is no acute intracranial hemorrhage. No demarcated cortical infarct. No extra-axial fluid collection. No evidence of intracranial mass. No midline shift. Redemonstrated nonspecific prominent left frontal dural calcification. Vascular: No hyperdense vessel.  Atherosclerotic calcifications Skull: Normal. Negative for fracture or focal lesion. Sinuses/Orbits: Left scleral buckle. Visualized orbits show no acute finding. Mild ethmoid, left sphenoid and right maxillary sinus mucosal thickening at the imaged levels. No significant mastoid effusion. IMPRESSION: No CT evidence of acute intracranial abnormality. Stable, mild-to-moderate generalized parenchymal atrophy and mild chronic small vessel ischemic disease. Mild paranasal sinus mucosal thickening at the imaged levels. Electronically Signed   By: Kellie Simmering DO   On: 03/15/2020 10:39   CT ABDOMEN PELVIS W CONTRAST  Result Date: 03/15/2020 CLINICAL DATA:  Abdominal pain, perirectal abscess suspected with swelling. EXAM: CT ABDOMEN AND PELVIS WITH CONTRAST TECHNIQUE: Multidetector CT imaging of the abdomen  and pelvis was performed using the standard protocol following bolus administration of intravenous contrast. CONTRAST:  145m OMNIPAQUE IOHEXOL 300 MG/ML  SOLN COMPARISON:  05/14/2018 FINDINGS: Lower chest: Small RIGHT-sided pleural effusion is new from the previous exam. Associated with mild basilar volume loss. Signs of RIGHT coronary intervention. Heart size mildly enlarged.  No pericardial fluid. Hepatobiliary: Hepatic steatosis. Portal vein is patent. Post cholecystectomy. No biliary duct dilation. Pancreas: Pancreatic atrophy. No peripancreatic inflammation or duct dilation. Spleen: Spleen normal size and contour. Adrenals/Urinary Tract: Adrenal glands are normal. Mild cortical scarring of the kidneys bilaterally without signs of hydronephrosis. Small low-density lesion in the upper pole the LEFT kidney likely a small cyst Small filling defect in the RIGHT renal pelvis versus contrast mixing. There is very subtle asymmetry of enhancement in this area as well best seen on sagittal image 59 of series 509. Question of filling defects seen on image 24 of delayed phase imaging in the axial plane. This areas incompletely opacified with limited assessment. Urinary bladder is markedly distended. Distal ureter select distal ureter distal ureteral evaluation hampered by susceptibility artifact from the patient's hip arthroplasty changes in the bilateral hips. Stomach/Bowel: Marked distension of the rectum filling the pelvic outlet measuring approximately 8.2 x 8.1 cm. Perirectal stranding. Bowing of levator in I suggests pelvic floor dysfunction. Imaging does not extend entirely through the perineum. No abscess is visualized  in the imaged portions of the abdomen and pelvis. Partially formed stool and liquid stool seen elsewhere in the colon. Mild distension of bowel loops in the RIGHT lower quadrant. Calcifications in the RIGHT lower quadrant mesentery with similar appearance to study from 2019. Distal ileal mural  stratification and narrowing best noted on image 51 of series 504 alternating areas of dilation and narrowing seen proximal to this, pattern is similar to previous imaging. Stomach is under distended. Vascular/Lymphatic: Calcified and noncalcified plaque in the abdominal aorta without aneurysmal dilation. No adenopathy in the retroperitoneum or in the upper abdomen. No pelvic lymphadenopathy. Reproductive: Nonspecific, unremarkable CT appearance of uterus and adnexa. Other: No ascites.  No abscess. Musculoskeletal: Cement augmentation of the L3 vertebral body. Compression fracture at T9 with a chronic appearance. IMPRESSION: 1. Finding of stercoral colitis/proctitis with perianal inflammation and descent of pelvic floor structures. Anal orifice and perianal regions are incompletely imaged. 2. Contrast mixing versus small filling defect in the RIGHT renal pelvis. Follow-up with urine cytology and urologic evaluation on a nonemergent basis may be helpful with hematuria evaluation as warranted. 3. Chronic areas of small bowel narrowing and dilation in the setting of Crohn's disease. No signs of perienteric stranding. Mild acute on chronic changes are however considered given appearance. Areas of more proximal ileal distension are slightly increased but without frank evidence of obstruction. 4. Moderate to marked distension of the urinary bladder could be secondarily related to rectal distension but there is no current hydronephrosis. 5. Hepatic steatosis. 6. New small RIGHT effusion. 7. Aortic atherosclerosis. Aortic Atherosclerosis (ICD10-I70.0). Electronically Signed   By: Zetta Bills M.D.   On: 03/15/2020 11:08   US Venous Img Lower Bilateral (DVT)  Result Date: 03/16/2020 CLINICAL DATA:  86 year old female with a history of cellulitis EXAM: BILATERAL LOWER EXTREMITY VENOUS DOPPLER ULTRASOUND TECHNIQUE: Gray-scale sonography with graded compression, as well as color Doppler and duplex ultrasound were  performed to evaluate the lower extremity deep venous systems from the level of the common femoral vein and including the common femoral, femoral, profunda femoral, popliteal and calf veins including the posterior tibial, peroneal and gastrocnemius veins when visible. The superficial great saphenous vein was also interrogated. Spectral Doppler was utilized to evaluate flow at rest and with distal augmentation maneuvers in the common femoral, femoral and popliteal veins. COMPARISON:  None. FINDINGS: RIGHT LOWER EXTREMITY Common Femoral Vein: No evidence of thrombus. Normal compressibility, respiratory phasicity and response to augmentation. Saphenofemoral Junction: No evidence of thrombus. Normal compressibility and flow on color Doppler imaging. Profunda Femoral Vein: No evidence of thrombus. Normal compressibility and flow on color Doppler imaging. Femoral Vein: No evidence of thrombus. Normal compressibility, respiratory phasicity and response to augmentation. Popliteal Vein: No evidence of thrombus. Normal compressibility, respiratory phasicity and response to augmentation. Calf Veins: No evidence of thrombus. Normal compressibility and flow on color Doppler imaging. Superficial Great Saphenous Vein: No evidence of thrombus. Normal compressibility and flow on color Doppler imaging. Other Findings:  None. LEFT LOWER EXTREMITY Common Femoral Vein: No evidence of thrombus. Normal compressibility, respiratory phasicity and response to augmentation. Saphenofemoral Junction: No evidence of thrombus. Normal compressibility and flow on color Doppler imaging. Profunda Femoral Vein: No evidence of thrombus. Normal compressibility and flow on color Doppler imaging. Femoral Vein: No evidence of thrombus. Normal compressibility, respiratory phasicity and response to augmentation. Popliteal Vein: No evidence of thrombus. Normal compressibility, respiratory phasicity and response to augmentation. Calf Veins: No evidence of  thrombus. Normal compressibility and flow on color Doppler  imaging. Superficial Great Saphenous Vein: No evidence of thrombus. Normal compressibility and flow on color Doppler imaging. Other Findings:  None. IMPRESSION: Sonographic survey of the bilateral lower extremities negative for DVT Electronically Signed   By: Corrie Mckusick D.O.   On: 03/16/2020 11:25   US Venous Img Upper Uni Right(DVT)  Result Date: 03/16/2020 CLINICAL DATA:  86 year old female with a history of cellulitis EXAM: RIGHT UPPER EXTREMITY VENOUS DOPPLER ULTRASOUND TECHNIQUE: Gray-scale sonography with graded compression, as well as color Doppler and duplex ultrasound were performed to evaluate the upper extremity deep venous system from the level of the subclavian vein and including the jugular, axillary, basilic, radial, ulnar and upper cephalic vein. Spectral Doppler was utilized to evaluate flow at rest and with distal augmentation maneuvers. COMPARISON:  None. FINDINGS: Contralateral Subclavian Vein: Respiratory phasicity is normal and symmetric with the symptomatic side. No evidence of thrombus. Normal compressibility. Internal Jugular Vein: No evidence of thrombus. Normal compressibility, respiratory phasicity and response to augmentation. Subclavian Vein: No evidence of thrombus. Normal compressibility, respiratory phasicity and response to augmentation. Axillary Vein: No evidence of thrombus. Normal compressibility, respiratory phasicity and response to augmentation. Cephalic Vein: No evidence of thrombus. Normal compressibility, respiratory phasicity and response to augmentation. Basilic Vein: No evidence of thrombus. Normal compressibility, respiratory phasicity and response to augmentation. Brachial Veins: No evidence of thrombus. Normal compressibility, respiratory phasicity and response to augmentation. Radial Veins: No evidence of thrombus. Normal compressibility, respiratory phasicity and response to augmentation. Ulnar Veins:  No evidence of thrombus. Normal compressibility, respiratory phasicity and response to augmentation. Other Findings:  Edema IMPRESSION: Sonographic survey of the right upper extremity negative for DVT Edema Electronically Signed   By: Corrie Mckusick D.O.   On: 03/16/2020 11:26    Wt Readings from Last 3 Encounters:  03/15/20 78 kg  12/15/19 78 kg  05/13/19 77.1 kg    EKG: Currently atrial fibrillation with RVR  Physical Exam:  Blood pressure (!) 131/75, pulse (!) 111, temperature 98.2 F (36.8 C), temperature source Oral, resp. rate 18, height 5' (1.524 m), weight 78 kg, SpO2 100 %. Body mass index is 33.59 kg/m. General: Well developed, well nourished, in no acute distress. Head: Normocephalic, atraumatic, sclera non-icteric, no xanthomas, nares are without discharge.  Neck: Negative for carotid bruits. JVD not elevated. Lungs: Clear bilaterally to auscultation without wheezes, rales, or rhonchi. Breathing is unlabored. Heart: Irregular regular/tachycardia no murmurs, rubs, or gallops appreciated. Abdomen: Soft, non-tender, non-distended with normoactive bowel sounds. No hepatomegaly. No rebound/guarding. No obvious abdominal masses. Msk:  Strength and tone appear normal for age. Extremities: No clubbing or cyanosis. No edema.  Distal pedal pulses are 2+ and equal bilaterally. Neuro: Alert and oriented X 3. No facial asymmetry. No focal deficit. Moves all extremities spontaneously. Psych:  Responds to questions appropriately with a normal affect.     Assessment and Plan  86 year old female with no prior A. fib admitted with cellulitis.  Has preserved LV function by echo several years ago.  Currently is in atrial fibrillation with RVR.  Hemodynamically stable.  A. fib-we will attempt to control the rate with short acting metoprolol tartrate 25 twice daily and Cardizem 30 mg every 6.  Agree with starting Eliquis.  Will follow for evidence of bleeding.  Exacerbation of the A. fib possibly  secondary to relative hypokalemia several days ago.  We will continue with magnesium.  Goal is magnesium level greater than 2 and potassium greater than 4.  Her mag level was 2.1 2  days ago.  We will review echocardiogram when available although will await rate control prior to doing echo as this will underestimate the ejection fraction.  Cellulitis-being treated with antibiotics.  Signed, Teodoro Spray MD 03/20/2020, 9:44 AM Pager: [phone]

## 2020-03-20 NOTE — Progress Notes (Signed)
Physical Therapy Treatment Patient Details Name: Joanne [name] MRN: [id number] DOB: 1930-09-27 Today's Date: 03/20/2020    History of Present Illness presented to ER secondary to pain, redness to R UE, bilat LEs; admitted for management of sepsis related to R UE, bilat LE cellulitis.    PT Comments    Fatigued this am but agrees on pm attempt.  Supine AAROM x 10 for ankle pumps, heel slides and ab/add.  Agrees to sitting EOB.  Excellent effort to get to EOB with rail and min/mod a x 1.  Steady in sitting with supervision.  She is able to sit x 10 minutes for LAQ and ankle pumps x 10 and no LOB.  Fatigued with activity.  Did not feel as she could stand today.  Returned to supine with min a x 1 for LE's and max a x 1 to reposition in bed.  HR stable with activity.   Follow Up Recommendations  SNF     Equipment Recommendations       Recommendations for Other Services       Precautions / Restrictions Precautions Precautions: Fall Restrictions Other Position/Activity Restrictions: watch HR    Mobility  Bed Mobility Overal bed mobility: Needs Assistance Bed Mobility: Supine to Sit;Sit to Supine     Supine to sit: Mod assist Sit to supine: Mod assist   General bed mobility comments: good effort and use of rails  Transfers                 General transfer comment: deferred due to fatigue  Ambulation/Gait                 Stairs             Wheelchair Mobility    Modified Rankin (Stroke Patients Only)       Balance Overall balance assessment: Needs assistance Sitting-balance support: No upper extremity supported;Feet supported Sitting balance-Leahy Scale: Fair                                      Cognition Arousal/Alertness: Awake/alert Behavior During Therapy: WFL for tasks assessed/performed Overall Cognitive Status: Within Functional Limits for tasks assessed                                         Exercises Other Exercises Other Exercises: see note    General Comments        Pertinent Vitals/Pain Pain Assessment: Faces Faces Pain Scale: Hurts a little bit Pain Location: R UE Pain Descriptors / Indicators: Guarding;Grimacing Pain Intervention(s): Limited activity within patient's tolerance;Monitored during session    Home Living                      Prior Function            PT Goals (current goals can now be found in the care plan section) Progress towards PT goals: Progressing toward goals    Frequency    Min 2X/week      PT Plan Current plan remains appropriate    Co-evaluation              AM-PAC PT "6 Clicks" Mobility   Outcome Measure  Help needed turning from your back to your side while in a flat bed without using bedrails?:  A Lot Help needed moving from lying on your back to sitting on the side of a flat bed without using bedrails?: A Lot Help needed moving to and from a bed to a chair (including a wheelchair)?: Total Help needed standing up from a chair using your arms (e.g., wheelchair or bedside chair)?: A Lot Help needed to walk in hospital room?: Total Help needed climbing 3-5 steps with a railing? : Total 6 Click Score: 9    End of Session Equipment Utilized During Treatment: Gait belt Activity Tolerance: Patient tolerated treatment well Patient left: in bed;with call bell/phone within reach;with bed alarm set;with family/visitor present Nurse Communication: Mobility status Pain - Right/Left: Right Pain - part of body: Arm     Time: 4445-8483 PT Time Calculation (min) (ACUTE ONLY): 23 min  Charges:  $Therapeutic Exercise: 8-22 mins $Therapeutic Activity: 8-22 mins                    Chesley Noon, PTA 03/20/20, 3:52 PM

## 2020-03-21 DIAGNOSIS — I5023 Acute on chronic systolic (congestive) heart failure: Secondary | ICD-10-CM

## 2020-03-21 DIAGNOSIS — I480 Paroxysmal atrial fibrillation: Secondary | ICD-10-CM

## 2020-03-21 DIAGNOSIS — A419 Sepsis, unspecified organism: Secondary | ICD-10-CM | POA: Diagnosis not present

## 2020-03-21 DIAGNOSIS — R531 Weakness: Secondary | ICD-10-CM | POA: Diagnosis not present

## 2020-03-21 DIAGNOSIS — L03113 Cellulitis of right upper limb: Secondary | ICD-10-CM | POA: Diagnosis not present

## 2020-03-21 LAB — CBC
HCT: 31 % — ABNORMAL LOW (ref 36.0–46.0)
Hemoglobin: 10.1 g/dL — ABNORMAL LOW (ref 12.0–15.0)
MCH: 28.1 pg (ref 26.0–34.0)
MCHC: 32.6 g/dL (ref 30.0–36.0)
MCV: 86.1 fL (ref 80.0–100.0)
Platelets: 614 10*3/uL — ABNORMAL HIGH (ref 150–400)
RBC: 3.6 MIL/uL — ABNORMAL LOW (ref 3.87–5.11)
RDW: 16.9 % — ABNORMAL HIGH (ref 11.5–15.5)
WBC: 13.4 10*3/uL — ABNORMAL HIGH (ref 4.0–10.5)
nRBC: 0 % (ref 0.0–0.2)

## 2020-03-21 LAB — BASIC METABOLIC PANEL
Anion gap: 9 (ref 5–15)
BUN: 11 mg/dL (ref 8–23)
CO2: 30 mmol/L (ref 22–32)
Calcium: 8.1 mg/dL — ABNORMAL LOW (ref 8.9–10.3)
Chloride: 98 mmol/L (ref 98–111)
Creatinine, Ser: 0.65 mg/dL (ref 0.44–1.00)
GFR calc Af Amer: >60 mL/min (ref >60)
GFR calc non Af Amer: >60 mL/min (ref >60)
Glucose, Bld: 124 mg/dL — ABNORMAL HIGH (ref 70–99)
Potassium: 4.2 mmol/L (ref 3.5–5.1)
Sodium: 137 mmol/L (ref 135–145)

## 2020-03-21 LAB — ECHOCARDIOGRAM COMPLETE
AR max vel: 2.12 cm2
AV Area VTI: 2.19 cm2
AV Area mean vel: 2.17 cm2
AV Mean grad: 3 mmHg
AV Peak grad: 6.4 mmHg
Ao pk vel: 1.26 m/s
Area-P 1/2: 8.43 cm2
Calc EF: 30.9 %
Height: 60 in
S' Lateral: 4.16 cm
Single Plane A2C EF: 30.6 %
Single Plane A4C EF: 30 %
Weight: 2752 oz

## 2020-03-21 LAB — MAGNESIUM: Magnesium: 1.6 mg/dL — ABNORMAL LOW (ref 1.7–2.4)

## 2020-03-21 MED ORDER — TORSEMIDE 20 MG PO TABS
20.0000 mg | ORAL_TABLET | Freq: Every day | ORAL | Status: DC
  Administered 2020-03-21 – 2020-03-22 (×2): 20 mg via ORAL
  Filled 2020-03-21 (×2): qty 1

## 2020-03-21 MED ORDER — MENTHOL 3 MG MT LOZG
1.0000 | LOZENGE | OROMUCOSAL | Status: DC
  Administered 2020-03-21 – 2020-03-23 (×8): 3 mg via ORAL
  Filled 2020-03-21: qty 9

## 2020-03-21 MED ORDER — MAGNESIUM SULFATE 2 GM/50ML IV SOLN
2.0000 g | Freq: Once | INTRAVENOUS | Status: AC
  Administered 2020-03-21: 2 g via INTRAVENOUS
  Filled 2020-03-21: qty 50

## 2020-03-21 MED ORDER — IPRATROPIUM-ALBUTEROL 0.5-2.5 (3) MG/3ML IN SOLN
3.0000 mL | Freq: Four times a day (QID) | RESPIRATORY_TRACT | Status: DC | PRN
  Administered 2020-03-21: 3 mL via RESPIRATORY_TRACT

## 2020-03-21 MED ORDER — MAGNESIUM OXIDE 400 (241.3 MG) MG PO TABS
400.0000 mg | ORAL_TABLET | Freq: Every day | ORAL | Status: DC
  Administered 2020-03-22 – 2020-03-24 (×3): 400 mg via ORAL
  Filled 2020-03-21 (×3): qty 1

## 2020-03-21 MED ORDER — IPRATROPIUM-ALBUTEROL 0.5-2.5 (3) MG/3ML IN SOLN
3.0000 mL | Freq: Three times a day (TID) | RESPIRATORY_TRACT | Status: DC
  Administered 2020-03-21 – 2020-03-24 (×8): 3 mL via RESPIRATORY_TRACT
  Filled 2020-03-21 (×11): qty 3

## 2020-03-21 MED ORDER — LIDOCAINE VISCOUS HCL 2 % MT SOLN
15.0000 mL | OROMUCOSAL | Status: DC | PRN
  Administered 2020-03-21: 15 mL via OROMUCOSAL
  Filled 2020-03-21 (×2): qty 15

## 2020-03-21 NOTE — Care Management Important Message (Signed)
Important Message  Patient Details  Name: Joanne [name] MRN: [id number] Date of Birth: 1930-09-09   Medicare Important Message Given:  Yes     [name] 03/21/2020, 11:33 AM

## 2020-03-21 NOTE — TOC Progression Note (Signed)
Transition of Care [facility]) - Progression Note    Patient Details  Name: Joanne [name] MRN: [id number] Date of Birth: April 12, 1931  [facility] CM/SW Contact  [name], RN Phone Number: 03/21/2020, 2:08 PM  Clinical Narrative:     Insurance auth approved  03/19/2020-03/22/2020  Per MD patient will not discharge today.  Ricky at [facility] notified   Expected Discharge Plan: [place] Barriers to Discharge: Continued Medical Work up  Expected Discharge Plan and Services Expected Discharge Plan: [place]   Discharge Planning Services: CM Consult   Living arrangements for the past 2 months: Single Family Home                                       Social Determinants of Health (SDOH) Interventions    Readmission Risk Interventions No flowsheet data found.

## 2020-03-21 NOTE — Progress Notes (Signed)
[facility]    SUBJECTIVE: Joanne [name] is an 86 year old female with a past medical history significant for hyperlipidemia, hypertension, GERD, and Crohn's disease who presented to the ED on 03/15/20 for pain and erythema/swelling in the legs and right arm.  While admitted, she developed atrial fibrillation with RVR and was started on Cardizem, metoprolol, and Eliquis.   03/21/20: Joanne [name] currently denies chest pain, palpitations, orthopnea, PND, dizziness, lightheadedness, or syncopal/presyncopal episodes.  She denies shortness of breath, but admits to difficulty taking a deep breath.    Vitals:   03/20/20 1453 03/20/20 1955 03/20/20 2109 03/21/20 0525  BP: (!) 120/63  (!) 128/53 (!) 137/77  Pulse: 82  94 82  Resp: 18  20 16   Temp: 98.7 F (37.1 C)  98.2 F (36.8 C) 98.5 F (36.9 C)  TempSrc: Oral  Oral Oral  SpO2: 94% 96% 92% 92%  Weight:      Height:         Intake/Output Summary (Last 24 hours) at 03/21/2020 0744 Last data filed at 03/21/2020 0100 Gross per 24 hour  Intake 404.78 ml  Output --  Net 404.78 ml      PHYSICAL EXAM  General: Well developed, well nourished, in no acute distress HEENT:  Normocephalic and atramatic Neck:  No JVD.  Lungs: Clear bilaterally to auscultation and percussion. Heart: HRRR . Normal S1 and S2 without gallops or murmurs.  Abdomen: Bowel sounds are positive, abdomen soft and non-tender  Msk:  Back normal.  Normal strength and tone for age. Extremities: No clubbing, cyanosis or edema.   Neuro: Alert and oriented X 3. Psych:  Good affect, responds appropriately   LABS: Basic Metabolic Panel: Recent Labs    03/19/20 0553 03/19/20 0553 03/20/20 0609 03/21/20 0552  NA 136  --   --  137  K 3.9  --   --  4.2  CL 98  --   --  98  CO2 28  --   --  30  GLUCOSE 112*  --   --  124*  BUN 10  --   --  11  CREATININE 0.62   < > 0.64 0.65  CALCIUM 8.1*  --   --  8.1*  MG  --   --   --  1.6*   < > = values in this interval not  displayed.   Liver Function Tests: No results for input(s): AST, ALT, ALKPHOS, BILITOT, PROT, ALBUMIN in the last 72 hours. No results for input(s): LIPASE, AMYLASE in the last 72 hours. CBC: Recent Labs    03/19/20 0553 03/21/20 0552  WBC 9.7 13.4*  HGB 10.1* 10.1*  HCT 31.3* 31.0*  MCV 86.9 86.1  PLT 517* 614*   Cardiac Enzymes: No results for input(s): CKTOTAL, CKMB, CKMBINDEX, TROPONINI in the last 72 hours. BNP: Invalid input(s): POCBNP D-Dimer: No results for input(s): DDIMER in the last 72 hours. Hemoglobin A1C: No results for input(s): HGBA1C in the last 72 hours. Fasting Lipid Panel: No results for input(s): CHOL, HDL, LDLCALC, TRIG, CHOLHDL, LDLDIRECT in the last 72 hours. Thyroid Function Tests: No results for input(s): TSH, T4TOTAL, T3FREE, THYROIDAB in the last 72 hours.  Invalid input(s): FREET3 Anemia Panel: No results for input(s): VITAMINB12, FOLATE, FERRITIN, TIBC, IRON, RETICCTPCT in the last 72 hours.  DG Chest Port 1 View  Result Date: 03/20/2020 CLINICAL DATA:  Cough and shortness of breath beginning yesterday. EXAM: PORTABLE CHEST 1 VIEW COMPARISON:  04/01/2017 FINDINGS: Lordotic technique is  demonstrated. Lungs are hypoinflated with hazy opacification over the left base/retrocardiac region which may be due to effusion/atelectasis versus infection. Linear atelectasis over the left midlung. Mild prominence of the central perihilar markings. Mild stable cardiomegaly. Remainder of the exam is unchanged. IMPRESSION: 1. Left base opacification which may be due to atelectasis/effusion versus infection. Linear atelectasis left midlung. 2.  Stable cardiomegaly with possible minimal vascular congestion. Electronically Signed   By: Marin Olp M.D.   On: 03/20/2020 11:17   ECHOCARDIOGRAM COMPLETE  Result Date: 03/21/2020    ECHOCARDIOGRAM REPORT   Patient Name:   Joanne [name] Date of Exam: 03/20/2020 Medical Rec #:  [id number]     Height:       60.0 in Accession  #:    [id number]    Weight:       172.0 lb Date of Birth:  [birth year]    BSA:          1.751 m Patient Age:    82 years      BP:           131/78 mmHg Patient Gender: F             HR:           91 bpm. Exam Location:  [facility] Procedure: 2D Echo, Cardiac Doppler and Color Doppler Indications:     Atrial Fibrillation 427.31 / I48.91  History:         Patient has no prior history of Echocardiogram examinations.                  Signs/Symptoms:Shortness of Breath; Risk Factors:Hypertension.  Sonographer:     Alyse Low Roar Referring Phys:  [id number] Loletha Grayer Diagnosing Phys: Bartholome Bill MD IMPRESSIONS  1. Left ventricular ejection fraction, by estimation, is 30 to 35%. The left ventricle has moderate to severely decreased function. The left ventricle demonstrates global hypokinesis. Left ventricular diastolic parameters were normal.  2. Right ventricular systolic function is normal. The right ventricular size is normal. There is normal pulmonary artery systolic pressure.  3. Moderate pleural effusion in the left lateral region.  4. The mitral valve was not well visualized. Mild to moderate mitral valve regurgitation.  5. The aortic valve was not well visualized. Aortic valve regurgitation is not visualized. FINDINGS  Left Ventricle: Left ventricular ejection fraction, by estimation, is 30 to 35%. The left ventricle has moderate to severely decreased function. The left ventricle demonstrates global hypokinesis. The left ventricular internal cavity size was normal in size. There is no left ventricular hypertrophy. Left ventricular diastolic parameters were normal. Right Ventricle: The right ventricular size is normal. No increase in right ventricular wall thickness. Right ventricular systolic function is normal. There is normal pulmonary artery systolic pressure. The tricuspid regurgitant velocity is 2.38 m/s, and  with an assumed right atrial pressure of 10 mmHg, the estimated right ventricular systolic pressure is  08.1 mmHg. Left Atrium: Left atrial size was normal in size. Right Atrium: Right atrial size was normal in size. Pericardium: There is no evidence of pericardial effusion. Mitral Valve: The mitral valve was not well visualized. Mild to moderate mitral valve regurgitation. Tricuspid Valve: The tricuspid valve is not well visualized. Tricuspid valve regurgitation is mild. Aortic Valve: The aortic valve was not well visualized. Aortic valve regurgitation is not visualized. Aortic valve mean gradient measures 3.0 mmHg. Aortic valve peak gradient measures 6.4 mmHg. Aortic valve area, by VTI measures 2.19 cm. Pulmonic Valve: The pulmonic valve was not  well visualized. Pulmonic valve regurgitation is trivial. Aorta: The aortic root was not well visualized. IAS/Shunts: The interatrial septum was not assessed. Additional Comments: There is a moderate pleural effusion in the left lateral region.  LEFT VENTRICLE PLAX 2D LVIDd:         4.82 cm      Diastology LVIDs:         4.16 cm      LV e' lateral:   4.35 cm/s LV PW:         1.02 cm      LV E/e' lateral: 28.0 LV IVS:        1.13 cm      LV e' medial:    3.81 cm/s LVOT diam:     2.10 cm      LV E/e' medial:  32.0 LV SV:         47 LV SV Index:   27 LVOT Area:     3.46 cm  LV Volumes (MOD) LV vol d, MOD A2C: 143.0 ml LV vol d, MOD A4C: 123.0 ml LV vol s, MOD A2C: 99.3 ml LV vol s, MOD A4C: 86.1 ml LV SV MOD A2C:     43.7 ml LV SV MOD A4C:     123.0 ml LV SV MOD BP:      44.1 ml RIGHT VENTRICLE RV Mid diam:    3.04 cm RV S prime:     9.46 cm/s TAPSE (M-mode): 1.7 cm LEFT ATRIUM             Index       RIGHT ATRIUM           Index LA diam:        4.35 cm 2.48 cm/m  RA Area:     19.10 cm LA Vol (A2C):   70.8 ml 40.44 ml/m RA Volume:   44.50 ml  25.42 ml/m LA Vol (A4C):   77.5 ml 44.27 ml/m LA Biplane Vol: 74.8 ml 42.73 ml/m  AORTIC VALVE                   PULMONIC VALVE AV Area (Vmax):    2.12 cm    PV Vmax:        0.72 m/s AV Area (Vmean):   2.17 cm    PV Peak grad:    2.1 mmHg AV Area (VTI):     2.19 cm    RVOT Peak grad: 1 mmHg AV Vmax:           126.00 cm/s AV Vmean:          83.800 cm/s AV VTI:            0.215 m AV Peak Grad:      6.4 mmHg AV Mean Grad:      3.0 mmHg LVOT Vmax:         77.30 cm/s LVOT Vmean:        52.600 cm/s LVOT VTI:          0.136 m LVOT/AV VTI ratio: 0.63  AORTA Ao Root diam: 2.80 cm MITRAL VALVE                TRICUSPID VALVE MV Area (PHT): 8.43 cm     TR Peak grad:   22.7 mmHg MV Decel Time: 90 msec      TR Vmax:        238.00 cm/s MV E velocity: 122.00 cm/s MV A velocity: 118.00 cm/s  SHUNTS MV E/A ratio:  1.03         Systemic VTI:  0.14 m MV A Prime:    8.9 cm/s     Systemic Diam: 2.10 cm Bartholome Bill MD Electronically signed by Bartholome Bill MD Signature Date/Time: 03/21/2020/6:41:04 AM    Final      Echo:  Moderately to severely reduced LV systolic function with an EF estimated between 30-35% with global hypokinesis and a moderate left pleural effusion; mild to moderate MR  TELEMETRY: Sinus rhythm to sinus tachycardia, rate in the upper 90s/low 100s   ASSESSMENT AND PLAN:  Principal Problem:   Right arm cellulitis Active Problems:   GERD without esophagitis   Sepsis ([place])   Crohn disease (HCC)   Depression   HTN (hypertension)   Proctitis   Cellulitis of lower extremity   Generalized weakness   Urinary retention   Hemorrhoids   Anal fissure   Wheeze   Atrial fibrillation with RVR (HCC)    1.  Atrial fibrillation with RVR   -New onset, in the setting of infection, hypokalemia and hypomagnesemia  -Echocardiogram during this admission revealed reduced LV systolic function   -Currently in sinus rhythm; will continue with current regimen for now and adjust medications in an outpatient setting when infection has resolved   -Would continue Eliquis 4m BID   -Continue close monitoring of potassium and magnesium and replete as indicated   -Recommend repeating an echocardiogram in an outpatient setting    2.  Left pleural  effusion   -Continue torsemide 271mdaily with close monitoring of potassium, renal function    The history, physical exam findings, and plan of care were all discussed with Dr. KeBartholome Billand all decision making was made in collaboration.   NiAvie ArenasPA-C 03/21/2020 7:44 AM

## 2020-03-21 NOTE — Progress Notes (Signed)
Patient ID: Joanne [name], female   DOB: [birth year], 86 y.o.   MRN: [id number] [facility] PROGRESS NOTE  Joanne [name] EPP:[id number] DOB: [birth year] DOA: 03/15/2020 PCP: Tracie Harrier, MD  HPI/Subjective: Patient complaining of difficulty taking a deep breath and trouble swallowing and pain in her throat.  She still has right arm pain.  Patient initially admitted with fall and found to have right arm cellulitis.  Objective: Vitals:   03/21/20 1240 03/21/20 1439  BP: 119/65   Pulse: 86   Resp:    Temp: 99.4 F (37.4 C)   SpO2: 90% 91%    Intake/Output Summary (Last 24 hours) at 03/21/2020 1753 Last data filed at 03/21/2020 1215 Gross per 24 hour  Intake 584.12 ml  Output --  Net 584.12 ml   Filed Weights   03/15/20 0853  Weight: 78 kg    ROS: Review of Systems  HENT: Positive for sore throat.   Respiratory: Positive for shortness of breath.   Cardiovascular: Positive for chest pain.  Gastrointestinal: Positive for diarrhea. Negative for abdominal pain, nausea and vomiting.  Musculoskeletal: Positive for joint pain.   Exam: Physical Exam HENT:     Head:     Comments: Lip sores bottom lip    Nose: No mucosal edema.     Mouth/Throat:     Pharynx: No oropharyngeal exudate.  Eyes:     General: Lids are normal.     Conjunctiva/sclera: Conjunctivae normal.     Pupils: Pupils are equal, round, and reactive to light.  Cardiovascular:     Rate and Rhythm: Regular rhythm. Tachycardia present.     Heart sounds: Normal heart sounds, S1 normal and S2 normal.  Pulmonary:     Breath sounds: Examination of the right-lower field reveals decreased breath sounds. Examination of the left-lower field reveals decreased breath sounds. Decreased breath sounds present. No wheezing, rhonchi or rales.  Abdominal:     Palpations: Abdomen is soft.     Tenderness: There is no abdominal tenderness.  Musculoskeletal:     Right ankle: Swelling present.     Left ankle: Swelling  present.  Skin:    General: Skin is warm.     Comments: Erythema and swelling right arm continues to improve. Bilateral lower extremity cellulitis fading with erythema.  Neurological:     Mental Status: She is alert and oriented to person, place, and time.       Data Reviewed: Basic Metabolic Panel: Recent Labs  Lab 03/15/20 0857 03/15/20 0857 03/16/20 0505 03/18/20 0437 03/19/20 0553 03/20/20 0609 03/21/20 0552  NA 135  --  135 137 136  --  137  K 3.7  --  3.6 2.5* 3.9  --  4.2  CL 95*  --  97* 94* 98  --  98  CO2 31  --  30 30 28   --  30  GLUCOSE 123*  --  102* 96 112*  --  124*  BUN 23  --  23 15 10   --  11  CREATININE 0.92   < > 0.82 0.73 0.62 0.64 0.65  CALCIUM 7.9*  --  7.6* 7.9* 8.1*  --  8.1*  MG  --   --   --  2.1  --   --  1.6*   < > = values in this interval not displayed.   CBC: Recent Labs  Lab 03/15/20 0857 03/16/20 0645 03/17/20 0819 03/19/20 0553 03/21/20 0552  WBC 15.1* 14.9* 13.2* 9.7 13.4*  HGB 10.8*  9.1* 9.9* 10.1* 10.1*  HCT 33.4* 27.7* 28.8* 31.3* 31.0*  MCV 86.5 85.8 82.5 86.9 86.1  PLT 488* 419* 441* 517* 614*   Cardiac Enzymes: Recent Labs  Lab 03/16/20 0505  CKTOTAL 119   BNP (last 3 results) Recent Labs    03/15/20 0857  BNP 573.7*      Recent Results (from the past 240 hour(s))  SARS Coronavirus 2 by RT PCR (hospital order, performed in [facility] lab) Nasopharyngeal Nasopharyngeal Swab     Status: None   Collection Time: 03/15/20  9:41 AM   Specimen: Nasopharyngeal Swab  Result Value Ref Range Status   SARS Coronavirus 2 NEGATIVE NEGATIVE Final    Comment: (NOTE) SARS-CoV-2 target nucleic acids are NOT DETECTED.  The SARS-CoV-2 RNA is generally detectable in upper and lower respiratory specimens during the acute phase of infection. The lowest concentration of SARS-CoV-2 viral copies this assay can detect is 250 copies / mL. A negative result does not preclude SARS-CoV-2 infection and should not be used  as the sole basis for treatment or other patient management decisions.  A negative result may occur with improper specimen collection / handling, submission of specimen other than nasopharyngeal swab, presence of viral mutation(s) within the areas targeted by this assay, and inadequate number of viral copies (<250 copies / mL). A negative result must be combined with clinical observations, patient history, and epidemiological information.  Fact Sheet for Patients:   StrictlyIdeas.no  Fact Sheet for Healthcare Providers: BankingDealers.co.za  This test is not yet approved or  cleared by the [place] FDA and has been authorized for detection and/or diagnosis of SARS-CoV-2 by FDA under an Emergency Use Authorization (EUA).  This EUA will remain in effect (meaning this test can be used) for the duration of the COVID-19 declaration under Section 564(b)(1) of the Act, 21 U.S.C. section 360bbb-3(b)(1), unless the authorization is terminated or revoked sooner.  Performed at [facility], [address] [place], [place] [address]   Culture, blood (x 2)     Status: None   Collection Time: 03/15/20  9:41 AM   Specimen: BLOOD  Result Value Ref Range Status   Specimen Description BLOOD RIGHT ANTECUBITAL  Final   Special Requests   Final    BOTTLES DRAWN AEROBIC AND ANAEROBIC Blood Culture results may not be optimal due to an excessive volume of blood received in culture bottles   Culture   Final    NO GROWTH 5 DAYS Performed at [facility], [address] [place], [place] [address]    Report Status 03/20/2020 FINAL  Final  Culture, blood (x 2)     Status: None   Collection Time: 03/15/20  9:41 AM   Specimen: BLOOD  Result Value Ref Range Status   Specimen Description BLOOD LEFT ANTECUBITAL  Final   Special Requests   Final    BOTTLES DRAWN AEROBIC AND ANAEROBIC Blood Culture results may not be optimal due to an  excessive volume of blood received in culture bottles   Culture   Final    NO GROWTH 5 DAYS Performed at [facility], [address], [place], [place] [address]    Report Status 03/20/2020 FINAL  Final  C Difficile Quick Screen w PCR reflex     Status: None   Collection Time: 03/16/20 11:34 AM   Specimen: STOOL  Result Value Ref Range Status   C Diff antigen NEGATIVE NEGATIVE Final   C Diff toxin NEGATIVE NEGATIVE  Final   C Diff interpretation No C. difficile detected.  Final    Comment: Performed at [facility], [address] [place], [place] [address]     Studies: [facility] Chest [place] 1 View  Result Date: 03/20/2020 CLINICAL DATA:  Cough and shortness of breath beginning yesterday. EXAM: PORTABLE CHEST 1 VIEW COMPARISON:  04/01/2017 FINDINGS: Lordotic technique is demonstrated. Lungs are hypoinflated with hazy opacification over the left base/retrocardiac region which may be due to effusion/atelectasis versus infection. Linear atelectasis over the left midlung. Mild prominence of the central perihilar markings. Mild stable cardiomegaly. Remainder of the exam is unchanged. IMPRESSION: 1. Left base opacification which may be due to atelectasis/effusion versus infection. Linear atelectasis left midlung. 2.  Stable cardiomegaly with possible minimal vascular congestion. Electronically Signed   By: Marin Olp M.D.   On: 03/20/2020 11:17   ECHOCARDIOGRAM COMPLETE  Result Date: 03/21/2020    ECHOCARDIOGRAM REPORT   Patient Name:   SHARONLEE NINE Date of Exam: 03/20/2020 Medical Rec #:  [id number]     Height:       60.0 in Accession #:    [id number]    Weight:       172.0 lb Date of Birth:  [birth year]    BSA:          1.751 m Patient Age:    33 years      BP:           131/78 mmHg Patient Gender: F             HR:           91 bpm. Exam Location:  [facility] Procedure: 2D Echo, Cardiac Doppler and Color Doppler Indications:     Atrial Fibrillation 427.31 / I48.91  History:          Patient has no prior history of Echocardiogram examinations.                  Signs/Symptoms:Shortness of Breath; Risk Factors:Hypertension.  Sonographer:     Alyse Low Roar Referring Phys:  [id number] Loletha Grayer Diagnosing Phys: Bartholome Bill MD IMPRESSIONS  1. Left ventricular ejection fraction, by estimation, is 30 to 35%. The left ventricle has moderate to severely decreased function. The left ventricle demonstrates global hypokinesis. Left ventricular diastolic parameters were normal.  2. Right ventricular systolic function is normal. The right ventricular size is normal. There is normal pulmonary artery systolic pressure.  3. Moderate pleural effusion in the left lateral region.  4. The mitral valve was not well visualized. Mild to moderate mitral valve regurgitation.  5. The aortic valve was not well visualized. Aortic valve regurgitation is not visualized. FINDINGS  Left Ventricle: Left ventricular ejection fraction, by estimation, is 30 to 35%. The left ventricle has moderate to severely decreased function. The left ventricle demonstrates global hypokinesis. The left ventricular internal cavity size was normal in size. There is no left ventricular hypertrophy. Left ventricular diastolic parameters were normal. Right Ventricle: The right ventricular size is normal. No increase in right ventricular wall thickness. Right ventricular systolic function is normal. There is normal pulmonary artery systolic pressure. The tricuspid regurgitant velocity is 2.38 m/s, and  with an assumed right atrial pressure of 10 mmHg, the estimated right ventricular systolic pressure is 93.8 mmHg. Left Atrium: Left atrial size was normal in size. Right Atrium: Right atrial size was normal in size. Pericardium: There is no evidence of pericardial effusion. Mitral Valve: The mitral valve was not well visualized. Mild  to moderate mitral valve regurgitation. Tricuspid Valve: The tricuspid valve is not well visualized. Tricuspid valve  regurgitation is mild. Aortic Valve: The aortic valve was not well visualized. Aortic valve regurgitation is not visualized. Aortic valve mean gradient measures 3.0 mmHg. Aortic valve peak gradient measures 6.4 mmHg. Aortic valve area, by VTI measures 2.19 cm. Pulmonic Valve: The pulmonic valve was not well visualized. Pulmonic valve regurgitation is trivial. Aorta: The aortic root was not well visualized. IAS/Shunts: The interatrial septum was not assessed. Additional Comments: There is a moderate pleural effusion in the left lateral region.  LEFT VENTRICLE PLAX 2D LVIDd:         4.82 cm      Diastology LVIDs:         4.16 cm      LV e' lateral:   4.35 cm/s LV PW:         1.02 cm      LV E/e' lateral: 28.0 LV IVS:        1.13 cm      LV e' medial:    3.81 cm/s LVOT diam:     2.10 cm      LV E/e' medial:  32.0 LV SV:         47 LV SV Index:   27 LVOT Area:     3.46 cm  LV Volumes (MOD) LV vol d, MOD A2C: 143.0 ml LV vol d, MOD A4C: 123.0 ml LV vol s, MOD A2C: 99.3 ml LV vol s, MOD A4C: 86.1 ml LV SV MOD A2C:     43.7 ml LV SV MOD A4C:     123.0 ml LV SV MOD BP:      44.1 ml RIGHT VENTRICLE RV Mid diam:    3.04 cm RV S prime:     9.46 cm/s TAPSE (M-mode): 1.7 cm LEFT ATRIUM             Index       RIGHT ATRIUM           Index LA diam:        4.35 cm 2.48 cm/m  RA Area:     19.10 cm LA Vol (A2C):   70.8 ml 40.44 ml/m RA Volume:   44.50 ml  25.42 ml/m LA Vol (A4C):   77.5 ml 44.27 ml/m LA Biplane Vol: 74.8 ml 42.73 ml/m  AORTIC VALVE                   PULMONIC VALVE AV Area (Vmax):    2.12 cm    PV Vmax:        0.72 m/s AV Area (Vmean):   2.17 cm    PV Peak grad:   2.1 mmHg AV Area (VTI):     2.19 cm    RVOT Peak grad: 1 mmHg AV Vmax:           126.00 cm/s AV Vmean:          83.800 cm/s AV VTI:            0.215 m AV Peak Grad:      6.4 mmHg AV Mean Grad:      3.0 mmHg LVOT Vmax:         77.30 cm/s LVOT Vmean:        52.600 cm/s LVOT VTI:          0.136 m LVOT/AV VTI ratio: 0.63  AORTA Ao Root diam: 2.80 cm  MITRAL VALVE  TRICUSPID VALVE MV Area (PHT): 8.43 cm     TR Peak grad:   22.7 mmHg MV Decel Time: 90 msec      TR Vmax:        238.00 cm/s MV E velocity: 122.00 cm/s MV A velocity: 118.00 cm/s  SHUNTS MV E/A ratio:  1.03         Systemic VTI:  0.14 m MV A Prime:    8.9 cm/s     Systemic Diam: 2.10 cm Bartholome Bill MD Electronically signed by Bartholome Bill MD Signature Date/Time: 03/21/2020/6:41:04 AM    Final     Scheduled Meds: . apixaban  5 mg Oral BID  . ascorbic acid  500 mg Oral Daily  . budesonide  9 mg Oral Daily  . budesonide (PULMICORT) nebulizer solution  0.5 mg Nebulization BID  . calcium-vitamin D  1 tablet Oral TID  . diltiazem  30 mg Oral Q6H  . DULoxetine  20 mg Oral Daily  . gabapentin  300 mg Oral TID  . hydrocortisone   Topical TID  . hydrocortisone  25 mg Rectal BID  . hydrocortisone cream  1 application Topical TID  . ipratropium-albuterol  3 mL Nebulization TID  . lidocaine  1 patch Transdermal Q24H  . liver oil-zinc oxide   Topical BID  . [START ON 03/22/2020] magnesium oxide  400 mg Oral Daily  . menthol-cetylpyridinium  1 lozenge Oral Q4H  . metoprolol tartrate  25 mg Oral BID  . multivitamin with minerals   Oral Daily  . pantoprazole  40 mg Oral Daily  . potassium chloride  20 mEq Oral BID  . prednisoLONE acetate  1 drop Both Eyes QHS  . torsemide  20 mg Oral Daily  . zinc sulfate  220 mg Oral Daily   Continuous Infusions: . sodium chloride 10 mL/hr at 03/21/20 1215  . cefTRIAXone (ROCEPHIN)  IV Stopped (03/20/20 2112)  . sodium chloride    . vancomycin 750 mg (03/21/20 1543)    Assessment/Plan:  1. Paroxysmal atrial fibrillation.  On Eliquis for anticoagulation Cardizem and metoprolol.  Heart rate better today than yesterday but still elevated. 2. Clinical sepsis present on admission.  Patient also had acute metabolic encephalopathy.  Extensive right arm cellulitis.  On vancomycin and Rocephin. 3. Dysphagia.  Start viscous lidocaine and  Cepacol throat lozenges. 4. Hemorrhoids, anal fissure and stercoral colitis.  Continue steroid cream 5. Weakness physical therapy recommends rehab 6. Crohn's disease of budesonide 7. Depression on Cymbalta 8. Acute on chronic systolic congestive heart failure.  Echo may underestimate EF with rapid heart rate.  Oral torsemide, oral Toprol.  BP likely too low for ACE inhibitor or ARB or spironolactone currently.     Code Status:     Code Status Orders  (From admission, onward)         Start     Ordered   03/15/20 1525  Full code  Continuous        03/15/20 1527        Code Status History    Date Active Date Inactive Code Status Order ID Comments User Context   08/11/2018 1854 08/13/2018 2032 Full Code [id number]  Henreitta Leber, MD Inpatient   08/11/2018 1737 08/11/2018 1854 DNR [id number]  Saundra Shelling, MD Inpatient   08/11/2018 1735 08/11/2018 1736 Full Code [id number]  Saundra Shelling, MD Inpatient   12/23/2017 1137 12/25/2017 1808 Full Code [id number]  Dereck Leep, MD Inpatient   01/22/2017 1502 01/23/2017 1413 Full  Code [id number]  Hillary Bow, MD ED   02/21/2015 1939 02/23/2015 1908 Full Code [id number]  Loletha Grayer, MD ED   Advance Care Planning Activity     Family Communication: Daughter at the bedside Disposition Plan: Status is: Inpatient  Dispo: The patient is from: Home              Anticipated d/c is to: Rehab              Anticipated d/c date is: With patient not being able to eat and severe mouth pain today I will try to get that better prior to making a decision upon disposition.              Patient currently being treated for paroxysmal atrial fibrillation clinical sepsis on IV antibiotics.  Now with mouth pain and my concern is that she may not be able to tolerate oral antibiotics.  Consultants:  Cardiology  Antibiotics:  Vancomycin  Rocephin  Time spent: 28 minutes  [place]

## 2020-03-22 ENCOUNTER — Inpatient Hospital Stay: Payer: Medicare Other

## 2020-03-22 ENCOUNTER — Encounter: Payer: Self-pay | Admitting: Internal Medicine

## 2020-03-22 DIAGNOSIS — L03113 Cellulitis of right upper limb: Secondary | ICD-10-CM | POA: Diagnosis not present

## 2020-03-22 DIAGNOSIS — A419 Sepsis, unspecified organism: Secondary | ICD-10-CM | POA: Diagnosis not present

## 2020-03-22 DIAGNOSIS — R0602 Shortness of breath: Secondary | ICD-10-CM | POA: Diagnosis not present

## 2020-03-22 DIAGNOSIS — I480 Paroxysmal atrial fibrillation: Secondary | ICD-10-CM | POA: Diagnosis not present

## 2020-03-22 LAB — CBC
HCT: 32.9 % — ABNORMAL LOW (ref 36.0–46.0)
Hemoglobin: 11 g/dL — ABNORMAL LOW (ref 12.0–15.0)
MCH: 28.6 pg (ref 26.0–34.0)
MCHC: 33.4 g/dL (ref 30.0–36.0)
MCV: 85.5 fL (ref 80.0–100.0)
Platelets: 616 10*3/uL — ABNORMAL HIGH (ref 150–400)
RBC: 3.85 MIL/uL — ABNORMAL LOW (ref 3.87–5.11)
RDW: 16.8 % — ABNORMAL HIGH (ref 11.5–15.5)
WBC: 14.9 10*3/uL — ABNORMAL HIGH (ref 4.0–10.5)
nRBC: 0 % (ref 0.0–0.2)

## 2020-03-22 LAB — BASIC METABOLIC PANEL
Anion gap: 8 (ref 5–15)
BUN: 13 mg/dL (ref 8–23)
CO2: 31 mmol/L (ref 22–32)
Calcium: 8.4 mg/dL — ABNORMAL LOW (ref 8.9–10.3)
Chloride: 96 mmol/L — ABNORMAL LOW (ref 98–111)
Creatinine, Ser: 0.67 mg/dL (ref 0.44–1.00)
GFR calc Af Amer: >60 mL/min (ref >60)
GFR calc non Af Amer: >60 mL/min (ref >60)
Glucose, Bld: 123 mg/dL — ABNORMAL HIGH (ref 70–99)
Potassium: 4.2 mmol/L (ref 3.5–5.1)
Sodium: 135 mmol/L (ref 135–145)

## 2020-03-22 LAB — SARS CORONAVIRUS 2 BY RT PCR (HOSPITAL ORDER, PERFORMED IN ~~LOC~~ HOSPITAL LAB): SARS Coronavirus 2: NEGATIVE

## 2020-03-22 MED ORDER — DILTIAZEM HCL ER COATED BEADS 120 MG PO CP24
120.0000 mg | ORAL_CAPSULE | Freq: Every day | ORAL | Status: DC
  Administered 2020-03-22 – 2020-03-24 (×2): 120 mg via ORAL
  Filled 2020-03-22 (×3): qty 1

## 2020-03-22 MED ORDER — POTASSIUM CHLORIDE CRYS ER 20 MEQ PO TBCR
40.0000 meq | EXTENDED_RELEASE_TABLET | Freq: Two times a day (BID) | ORAL | Status: DC
  Administered 2020-03-22 – 2020-03-24 (×5): 40 meq via ORAL
  Filled 2020-03-22 (×5): qty 2

## 2020-03-22 MED ORDER — IOHEXOL 350 MG/ML SOLN
75.0000 mL | Freq: Once | INTRAVENOUS | Status: AC | PRN
  Administered 2020-03-22: 75 mL via INTRAVENOUS

## 2020-03-22 MED ORDER — METOPROLOL TARTRATE 25 MG PO TABS: 25.0000 mg | ORAL_TABLET | Freq: Two times a day (BID) | ORAL | 0 refills | Status: DC

## 2020-03-22 MED ORDER — LIDOCAINE VISCOUS HCL 2 % MT SOLN: 15.0000 mL | OROMUCOSAL | 0 refills | Status: DC | PRN

## 2020-03-22 MED ORDER — APIXABAN 5 MG PO TABS: 5.0000 mg | ORAL_TABLET | Freq: Two times a day (BID) | ORAL | 0 refills | Status: AC

## 2020-03-22 MED ORDER — MENTHOL 3 MG MT LOZG: 1.0000 | LOZENGE | OROMUCOSAL | 0 refills | Status: DC | PRN

## 2020-03-22 MED ORDER — CEFDINIR 300 MG PO CAPS
300.0000 mg | ORAL_CAPSULE | Freq: Two times a day (BID) | ORAL | 0 refills | Status: AC
Start: 2020-03-22 — End: 2020-03-25

## 2020-03-22 MED ORDER — TORSEMIDE 20 MG PO TABS: 20.0000 mg | ORAL_TABLET | Freq: Every day | ORAL | 0 refills | Status: DC

## 2020-03-22 MED ORDER — FUROSEMIDE 10 MG/ML IJ SOLN
40.0000 mg | Freq: Two times a day (BID) | INTRAMUSCULAR | Status: DC
  Administered 2020-03-22 – 2020-03-24 (×3): 40 mg via INTRAVENOUS
  Filled 2020-03-22 (×5): qty 4

## 2020-03-22 MED ORDER — DILTIAZEM HCL ER COATED BEADS 120 MG PO CP24: 120.0000 mg | ORAL_CAPSULE | Freq: Every day | ORAL | 0 refills | Status: AC

## 2020-03-22 MED ORDER — TRAZODONE HCL 50 MG PO TABS: 50.0000 mg | ORAL_TABLET | Freq: Every evening | ORAL | 0 refills | Status: DC | PRN

## 2020-03-22 MED ORDER — ZINC OXIDE 40 % EX OINT: TOPICAL_OINTMENT | Freq: Two times a day (BID) | CUTANEOUS | 0 refills | Status: DC

## 2020-03-22 MED ORDER — DOXYCYCLINE HYCLATE 100 MG PO TABS
100.0000 mg | ORAL_TABLET | Freq: Two times a day (BID) | ORAL | Status: DC
  Administered 2020-03-22 – 2020-03-23 (×3): 100 mg via ORAL
  Filled 2020-03-22 (×3): qty 1

## 2020-03-22 MED ORDER — HYDROCORTISONE 1 % EX CREA: TOPICAL_CREAM | CUTANEOUS | 0 refills | Status: DC

## 2020-03-22 MED ORDER — LISINOPRIL 2.5 MG PO TABS: 2.5000 mg | ORAL_TABLET | Freq: Every day | ORAL | 0 refills | Status: DC

## 2020-03-22 MED ORDER — GABAPENTIN 300 MG PO CAPS: 300.0000 mg | ORAL_CAPSULE | Freq: Three times a day (TID) | ORAL | 0 refills | Status: DC

## 2020-03-22 MED ORDER — FUROSEMIDE 10 MG/ML IJ SOLN
60.0000 mg | Freq: Once | INTRAMUSCULAR | Status: AC
  Administered 2020-03-22: 60 mg via INTRAVENOUS
  Filled 2020-03-22: qty 8

## 2020-03-22 MED ORDER — DOXYCYCLINE HYCLATE 100 MG PO TABS: 100.0000 mg | ORAL_TABLET | Freq: Two times a day (BID) | ORAL | 0 refills | Status: AC

## 2020-03-22 NOTE — Progress Notes (Signed)
[place] visited pt. per OR for advanced directive education.  Pt. sitting up in bed awake w/dtr. Vicky standing at bedside.  RN in rm. explained dtr. and pt. had questions re: code status and advanced care planning.  In extended visit, [place] learned that dtr. is concerned about her mother's hospitalization last week and continued weakness compared to her usual relatively good health; dtr. expressed a worry that she and her sister were going to have to watch their mother become a 'vegetable', dependent on artificial life support; dtr. compared this scenario with the death of her father, who died peacefully in his sleep, and with the death of her ex-mother in law whose family 'IV-ed her out' with morphine.  Pt.'s current hospitalization and new health problems have caused dtr to have to face questions re: end of life.  In order to further explore pt.'s concerns and questions at this time, [facility] invited dtr. to allow [facility] to speak w/pt. alone for some time.  In conversation w/pt. [place] learned that pt. too is concerned re: this current hospitalization and the health problems she is experiencing.  She has been hospitalized before for hip replacements and suffers from Crohn's disease; but she is concerned w/degree of weakness and shortness of breath she has been experiencing now.  Pt. expressed fear that her daughters were prematurely discussing death; [place] assured pt. dtrs. are simply processing pt.'s hospitalization.  Pt. expressed that she would not like to be kept alive by artificial means, and [facility] recommended that she complete Living Will to put these wishes in writing.  At length dtr. Vicky rejoined in rm. and Ch helped pt. complete AD, naming Vicky as MPOA and other dtr. Cindy as alt. MPOA.  Document is completed and awaits notarization tomorrow AM.  [facility] offered prayer for pt. and dtr. and for pt.'s sister w/Alzheimers in [place].  Pt. and dtr. grateful for [facility] support.  [place] will follow up tomorrow if possible or make referral.     03/22/20 1430  Clinical Encounter Type  Visited With Patient and family together  Visit Type Initial;Psychological support;Spiritual support;Social support (Financial controller)  Referral From Family;Patient  Consult/Referral To Chaplain  Spiritual Encounters  Spiritual Needs Emotional;Prayer;Other (Comment) (Advanced Directive education)  Stress Factors  Patient Stress Factors Loss of control;Major life changes;Health changes;Family relationships  Family Stress Factors Loss of control;Major life changes;Health changes;Lack of knowledge

## 2020-03-22 NOTE — Progress Notes (Signed)
Patient ID: CALEIGHA ZALE, female   DOB: 1931/06/13, 86 y.o.   MRN: [id number]  Patient unable to come off oxygen.  PO 86% room air. Patient having some shortness of breath, using some abdominal muscles to breathe  Will give dose of iv lasix Will get cxr  Re-eval later.  Dr. Leslye Peer

## 2020-03-22 NOTE — Progress Notes (Signed)
Patient ID: Joanne [name], female   DOB: Dec 26, 1930, 86 y.o.   MRN: [id number] [facility] PROGRESS NOTE  Joanne [name] RAQ:[id number] DOB: 1931-05-09 DOA: 03/15/2020 PCP: Tracie Harrier, MD  HPI/Subjective: Patient does not feel well this afternoon.  Still having shortness of breath.  Unable to come off oxygen earlier today.  Objective: Vitals:   03/22/20 1201 03/22/20 1250  BP: (!) 123/61 (!) 133/96  Pulse: 88 84  Resp: 20   Temp: 98.9 F (37.2 C)   SpO2: 99%     Intake/Output Summary (Last 24 hours) at 03/22/2020 1318 Last data filed at 03/22/2020 0900 Gross per 24 hour  Intake 491.92 ml  Output 200 ml  Net 291.92 ml   Filed Weights   03/15/20 0853  Weight: 78 kg    ROS: Review of Systems  Respiratory: Positive for sputum production.   Cardiovascular: Negative for chest pain.  Gastrointestinal: Negative for abdominal pain, nausea and vomiting.   Exam: Physical Exam HENT:     Nose: No mucosal edema.     Mouth/Throat:     Pharynx: No oropharyngeal exudate.  Eyes:     General: Lids are normal.     Conjunctiva/sclera: Conjunctivae normal.     Pupils: Pupils are equal, round, and reactive to light.  Cardiovascular:     Rate and Rhythm: Normal rate and regular rhythm.     Heart sounds: S1 normal and S2 normal.  Pulmonary:     Effort: Accessory muscle usage present.     Breath sounds: Examination of the right-lower field reveals decreased breath sounds and rhonchi. Examination of the left-lower field reveals decreased breath sounds and rhonchi. Decreased breath sounds and rhonchi present. No wheezing or rales.  Abdominal:     General: Bowel sounds are normal.     Palpations: Abdomen is soft.     Tenderness: There is no abdominal tenderness.  Musculoskeletal:     Right lower leg: Swelling present.     Left lower leg: Swelling present.  Skin:    General: Skin is warm.     Comments: Right arm erythema much improved fading. Bilateral lower extremity  erythema has faded.  Neurological:     Mental Status: She is alert and oriented to person, place, and time.       Data Reviewed: Basic Metabolic Panel: Recent Labs  Lab 03/16/20 0505 03/16/20 0505 03/18/20 0437 03/19/20 0553 03/20/20 0609 03/21/20 0552 03/22/20 0545  NA 135  --  137 136  --  137 135  K 3.6  --  2.5* 3.9  --  4.2 4.2  CL 97*  --  94* 98  --  98 96*  CO2 30  --  30 28  --  30 31  GLUCOSE 102*  --  96 112*  --  124* 123*  BUN 23  --  15 10  --  11 13  CREATININE 0.82   < > 0.73 0.62 0.64 0.65 0.67  CALCIUM 7.6*  --  7.9* 8.1*  --  8.1* 8.4*  MG  --   --  2.1  --   --  1.6*  --    < > = values in this interval not displayed.   Liver Function Tests: No results for input(s): AST, ALT, ALKPHOS, BILITOT, PROT, ALBUMIN in the last 168 hours. No results for input(s): LIPASE, AMYLASE in the last 168 hours. No results for input(s): AMMONIA in the last 168 hours. CBC: Recent Labs  Lab 03/16/20 0645 03/17/20  7782 03/19/20 0553 03/21/20 0552 03/22/20 0545  WBC 14.9* 13.2* 9.7 13.4* 14.9*  HGB 9.1* 9.9* 10.1* 10.1* 11.0*  HCT 27.7* 28.8* 31.3* 31.0* 32.9*  MCV 85.8 82.5 86.9 86.1 85.5  PLT 419* 441* 517* 614* 616*   Cardiac Enzymes: Recent Labs  Lab 03/16/20 0505  CKTOTAL 119   BNP (last 3 results) Recent Labs    03/15/20 0857  BNP 573.7*      Recent Results (from the past 240 hour(s))  SARS Coronavirus 2 by RT PCR (hospital order, performed in [facility] hospital lab) Nasopharyngeal Nasopharyngeal Swab     Status: None   Collection Time: 03/15/20  9:41 AM   Specimen: Nasopharyngeal Swab  Result Value Ref Range Status   SARS Coronavirus 2 NEGATIVE NEGATIVE Final    Comment: (NOTE) SARS-CoV-2 target nucleic acids are NOT DETECTED.  The SARS-CoV-2 RNA is generally detectable in upper and lower respiratory specimens during the acute phase of infection. The lowest concentration of SARS-CoV-2 viral copies this assay can detect is 250 copies /  mL. A negative result does not preclude SARS-CoV-2 infection and should not be used as the sole basis for treatment or other patient management decisions.  A negative result may occur with improper specimen collection / handling, submission of specimen other than nasopharyngeal swab, presence of viral mutation(s) within the areas targeted by this assay, and inadequate number of viral copies (<250 copies / mL). A negative result must be combined with clinical observations, patient history, and epidemiological information.  Fact Sheet for Patients:   StrictlyIdeas.no  Fact Sheet for Healthcare Providers: BankingDealers.co.za  This test is not yet approved or  cleared by the [place] FDA and has been authorized for detection and/or diagnosis of SARS-CoV-2 by FDA under an Emergency Use Authorization (EUA).  This EUA will remain in effect (meaning this test can be used) for the duration of the COVID-19 declaration under Section 564(b)(1) of the Act, 21 U.S.C. section 360bbb-3(b)(1), unless the authorization is terminated or revoked sooner.  Performed at [facility], [address] [place], [place] [address]   Culture, blood (x 2)     Status: None   Collection Time: 03/15/20  9:41 AM   Specimen: BLOOD  Result Value Ref Range Status   Specimen Description BLOOD RIGHT ANTECUBITAL  Final   Special Requests   Final    BOTTLES DRAWN AEROBIC AND ANAEROBIC Blood Culture results may not be optimal due to an excessive volume of blood received in culture bottles   Culture   Final    NO GROWTH 5 DAYS Performed at [facility], [address] [place], [place] [address]    Report Status 03/20/2020 FINAL  Final  Culture, blood (x 2)     Status: None   Collection Time: 03/15/20  9:41 AM   Specimen: BLOOD  Result Value Ref Range Status   Specimen Description BLOOD LEFT ANTECUBITAL  Final   Special Requests   Final     BOTTLES DRAWN AEROBIC AND ANAEROBIC Blood Culture results may not be optimal due to an excessive volume of blood received in culture bottles   Culture   Final    NO GROWTH 5 DAYS Performed at [facility], [address] [place],  [address]    Report Status 03/20/2020 FINAL  Final  C Difficile Quick Screen w PCR reflex     Status: None   Collection Time: 03/16/20 11:34 AM   Specimen: STOOL  Result Value Ref  Range Status   C Diff antigen NEGATIVE NEGATIVE Final   C Diff toxin NEGATIVE NEGATIVE Final   C Diff interpretation No C. difficile detected.  Final    Comment: Performed at [facility], [address] [place], [place] [address]     Studies: DG Chest [place] 1 View  Result Date: 03/22/2020 CLINICAL DATA:  Shortness of breath EXAM: PORTABLE CHEST 1 VIEW COMPARISON:  03/20/2020 FINDINGS: No significant change in AP portable examination with cardiomegaly, mild diffuse interstitial pulmonary opacity, and layering bilateral pleural effusions with associated atelectasis or consolidation. No new airspace opacity. IMPRESSION: No significant change in AP portable examination with cardiomegaly, mild diffuse interstitial pulmonary opacity, and layering bilateral pleural effusions with associated atelectasis or consolidation. No new airspace opacity. Electronically Signed   By: Eddie Candle M.D.   On: 03/22/2020 12:44   ECHOCARDIOGRAM COMPLETE  Result Date: 03/21/2020    ECHOCARDIOGRAM REPORT   Patient Name:   Joanne [name] Date of Exam: 03/20/2020 Medical Rec #:  [id number]     Height:       60.0 in Accession #:    [id number]    Weight:       172.0 lb Date of Birth:  06/18/1931    BSA:          1.751 m Patient Age:    40 years      BP:           131/78 mmHg Patient Gender: F             HR:           91 bpm. Exam Location:  [facility] Procedure: 2D Echo, Cardiac Doppler and Color Doppler Indications:     Atrial Fibrillation 427.31 / I48.91  History:         Patient has no prior  history of Echocardiogram examinations.                  Signs/Symptoms:Shortness of Breath; Risk Factors:Hypertension.  Sonographer:     Alyse Low Roar Referring Phys:  [id number] Loletha Grayer Diagnosing Phys: Bartholome Bill MD IMPRESSIONS  1. Left ventricular ejection fraction, by estimation, is 30 to 35%. The left ventricle has moderate to severely decreased function. The left ventricle demonstrates global hypokinesis. Left ventricular diastolic parameters were normal.  2. Right ventricular systolic function is normal. The right ventricular size is normal. There is normal pulmonary artery systolic pressure.  3. Moderate pleural effusion in the left lateral region.  4. The mitral valve was not well visualized. Mild to moderate mitral valve regurgitation.  5. The aortic valve was not well visualized. Aortic valve regurgitation is not visualized. FINDINGS  Left Ventricle: Left ventricular ejection fraction, by estimation, is 30 to 35%. The left ventricle has moderate to severely decreased function. The left ventricle demonstrates global hypokinesis. The left ventricular internal cavity size was normal in size. There is no left ventricular hypertrophy. Left ventricular diastolic parameters were normal. Right Ventricle: The right ventricular size is normal. No increase in right ventricular wall thickness. Right ventricular systolic function is normal. There is normal pulmonary artery systolic pressure. The tricuspid regurgitant velocity is 2.38 m/s, and  with an assumed right atrial pressure of 10 mmHg, the estimated right ventricular systolic pressure is 45.8 mmHg. Left Atrium: Left atrial size was normal in size. Right Atrium: Right atrial size was normal in size. Pericardium: There is no evidence of pericardial effusion. Mitral Valve: The mitral valve was not well visualized. Mild to  moderate mitral valve regurgitation. Tricuspid Valve: The tricuspid valve is not well visualized. Tricuspid valve regurgitation is mild.  Aortic Valve: The aortic valve was not well visualized. Aortic valve regurgitation is not visualized. Aortic valve mean gradient measures 3.0 mmHg. Aortic valve peak gradient measures 6.4 mmHg. Aortic valve area, by VTI measures 2.19 cm. Pulmonic Valve: The pulmonic valve was not well visualized. Pulmonic valve regurgitation is trivial. Aorta: The aortic root was not well visualized. IAS/Shunts: The interatrial septum was not assessed. Additional Comments: There is a moderate pleural effusion in the left lateral region.  LEFT VENTRICLE PLAX 2D LVIDd:         4.82 cm      Diastology LVIDs:         4.16 cm      LV e' lateral:   4.35 cm/s LV PW:         1.02 cm      LV E/e' lateral: 28.0 LV IVS:        1.13 cm      LV e' medial:    3.81 cm/s LVOT diam:     2.10 cm      LV E/e' medial:  32.0 LV SV:         47 LV SV Index:   27 LVOT Area:     3.46 cm  LV Volumes (MOD) LV vol d, MOD A2C: 143.0 ml LV vol d, MOD A4C: 123.0 ml LV vol s, MOD A2C: 99.3 ml LV vol s, MOD A4C: 86.1 ml LV SV MOD A2C:     43.7 ml LV SV MOD A4C:     123.0 ml LV SV MOD BP:      44.1 ml RIGHT VENTRICLE RV Mid diam:    3.04 cm RV S prime:     9.46 cm/s TAPSE (M-mode): 1.7 cm LEFT ATRIUM             Index       RIGHT ATRIUM           Index LA diam:        4.35 cm 2.48 cm/m  RA Area:     19.10 cm LA Vol (A2C):   70.8 ml 40.44 ml/m RA Volume:   44.50 ml  25.42 ml/m LA Vol (A4C):   77.5 ml 44.27 ml/m LA Biplane Vol: 74.8 ml 42.73 ml/m  AORTIC VALVE                   PULMONIC VALVE AV Area (Vmax):    2.12 cm    PV Vmax:        0.72 m/s AV Area (Vmean):   2.17 cm    PV Peak grad:   2.1 mmHg AV Area (VTI):     2.19 cm    RVOT Peak grad: 1 mmHg AV Vmax:           126.00 cm/s AV Vmean:          83.800 cm/s AV VTI:            0.215 m AV Peak Grad:      6.4 mmHg AV Mean Grad:      3.0 mmHg LVOT Vmax:         77.30 cm/s LVOT Vmean:        52.600 cm/s LVOT VTI:          0.136 m LVOT/AV VTI ratio: 0.63  AORTA Ao Root diam: 2.80 cm MITRAL VALVE  TRICUSPID VALVE MV Area (PHT): 8.43 cm     TR Peak grad:   22.7 mmHg MV Decel Time: 90 msec      TR Vmax:        238.00 cm/s MV E velocity: 122.00 cm/s MV A velocity: 118.00 cm/s  SHUNTS MV E/A ratio:  1.03         Systemic VTI:  0.14 m MV A Prime:    8.9 cm/s     Systemic Diam: 2.10 cm Bartholome Bill MD Electronically signed by Bartholome Bill MD Signature Date/Time: 03/21/2020/6:41:04 AM    Final     Scheduled Meds: . apixaban  5 mg Oral BID  . ascorbic acid  500 mg Oral Daily  . budesonide  9 mg Oral Daily  . budesonide (PULMICORT) nebulizer solution  0.5 mg Nebulization BID  . calcium-vitamin D  1 tablet Oral TID  . diltiazem  120 mg Oral Daily  . doxycycline  100 mg Oral Q12H  . DULoxetine  20 mg Oral Daily  . gabapentin  300 mg Oral TID  . hydrocortisone   Topical TID  . hydrocortisone  25 mg Rectal BID  . hydrocortisone cream  1 application Topical TID  . ipratropium-albuterol  3 mL Nebulization TID  . lidocaine  1 patch Transdermal Q24H  . liver oil-zinc oxide   Topical BID  . magnesium oxide  400 mg Oral Daily  . menthol-cetylpyridinium  1 lozenge Oral Q4H  . metoprolol tartrate  25 mg Oral BID  . multivitamin with minerals   Oral Daily  . pantoprazole  40 mg Oral Daily  . potassium chloride  20 mEq Oral BID  . prednisoLONE acetate  1 drop Both Eyes QHS  . torsemide  20 mg Oral Daily  . zinc sulfate  220 mg Oral Daily   Continuous Infusions: . sodium chloride Stopped (03/21/20 1543)  . cefTRIAXone (ROCEPHIN)  IV Stopped (03/22/20 0004)  . sodium chloride      Assessment/Plan:  1. Worsening shortness of breath and not feeling well.  I did order a dose of IV Lasix.  Chest x-ray does not show heart failure or pneumonia does have small pleural effusion.  We will get a CT scan of the chest to rule out pulmonary embolism. 2. Acute hypoxic respiratory failure.  Pulse ox 80 per 6% when I took her off oxygen this morning.  Oxygen supplementation. 3. Clinical sepsis, present on  admission with right arm cellulitis.  Patient also had acute metabolic encephalopathy.  Switch vancomycin over to doxycycline.  On Rocephin. 4. Paroxysmal atrial fibrillation.  On Eliquis for anticoagulation and Cardizem and metoprolol for heart rate control. 5. Dysphagia started viscous lidocaine and Cepacol throat lozenges and does 6. Crohn's disease on budesonide 7. Hemorrhoids, anal fissure and stercoral colitis.  On steroid cream. 8. Weakness.  Physical therapy recommends rehab.  Window of authorization ends today unfortunately unable to get the patient out of hospital today. 9. Acute on chronic systolic congestive heart failure.  Echo likely underestimated EF with rapid heart rate at the time.  On torsemide and oral Toprol.  Was hoping to start ACE inhibitor as outpatient.    Code Status:     Code Status Orders  (From admission, onward)         Start     Ordered   03/15/20 1525  Full code  Continuous        03/15/20 1527        Code Status History  Date Active Date Inactive Code Status Order ID Comments User Context   08/11/2018 1854 08/13/2018 2032 Full Code [id number]  Henreitta Leber, MD Inpatient   08/11/2018 1737 08/11/2018 1854 DNR [id number]  Saundra Shelling, MD Inpatient   08/11/2018 1735 08/11/2018 1736 Full Code [id number]  Saundra Shelling, MD Inpatient   12/23/2017 1137 12/25/2017 1808 Full Code [id number]  Dereck Leep, MD Inpatient   01/22/2017 1502 01/23/2017 1413 Full Code [id number]  Hillary Bow, MD ED   02/21/2015 1939 02/23/2015 1908 Full Code [id number]  Loletha Grayer, MD ED   Advance Care Planning Activity     Family Communication: Daughter at the bedside.  Evaluated the patient 3 times today. Disposition Plan: Status is: Inpatient  Dispo: The patient is from: Home              Anticipated d/c is to: Rehab              Anticipated d/c date is: Unfortunately had to cancel discharge today secondary to worsening shortness of breath.  The patient's window for  authorization ends today also.              Patient currently needs to get respiratory status better prior to disposition.  Consultants:  Cardiology  Antibiotics:  Vancomycin switch to doxycycline  Rocephin  Time spent: 50 minutes total today  [place]

## 2020-03-22 NOTE — TOC Transition Note (Signed)
Transition of Care [facility]) - CM/SW Discharge Note   Patient Details  Name: Joanne [name] MRN: [id number] Date of Birth: May 19, 1931  Transition of Care [facility]) CM/SW Contact:  [name], RN Phone Number: 03/22/2020, 10:51 AM   Clinical Narrative:     Patient to discharge to [facility] today.   DC info sent in the [facility].   Ricky at [employer] notifed  Bedside RN to call report.   EMS packet on chart Rapid covid test pending.  Once results received TOC to call EMS  Final next level of care: [place] Barriers to Discharge: No Barriers Identified   Patient Goals and CMS Choice        Discharge Placement              Patient chooses bed at: [facility]     Patient and family notified of of transfer: 03/22/20  Discharge Plan and Services   Discharge Planning Services: CM Consult                                 Social Determinants of Health (SDOH) Interventions     Readmission Risk Interventions No flowsheet data found.

## 2020-03-22 NOTE — Discharge Summary (Signed)
[facility] NAME: Joanne [name]    MR#:  [id number]  DATE OF BIRTH:  1931-02-05  DATE OF ADMISSION:  03/15/2020 ADMITTING PHYSICIAN: Ivor Costa, MD  DATE OF DISCHARGE: 03/22/2020  PRIMARY CARE PHYSICIAN: Tracie Harrier, MD    ADMISSION DIAGNOSIS:  Sinus tachycardia [R00.0] Urinary retention [R33.9] Cellulitis [L03.90] Cellulitis of right upper extremity [L03.113] Generalized weakness [R53.1] Right arm cellulitis [L03.113] Cellulitis of lower extremity, unspecified laterality [D17.616]  DISCHARGE DIAGNOSIS:  Principal Problem:   Right arm cellulitis Active Problems:   GERD without esophagitis   Sepsis ([place])   Crohn disease ([place])   Depression   HTN (hypertension)   Proctitis   Cellulitis of lower extremity   Generalized weakness   Urinary retention   Hemorrhoids   Anal fissure   Wheeze   Atrial fibrillation with RVR (HCC)   AF (paroxysmal atrial fibrillation) (HCC)   Acute on chronic systolic CHF (congestive heart failure) ([place])   SECONDARY DIAGNOSIS:   Past Medical History:  Diagnosis Date  . Anemia, unspecified    B12 and iron deficiency   . Arthritis   . B12 deficiency   . Collagen vascular disease ([place])   . Crohn disease ([place])   . Crohn's disease ([place])    followed by Dr. Vira Agar with serial colonoscopies  . Dyspnea   . Fuchs' endothelial dystrophy   . GERD (gastroesophageal reflux disease)   . History of bone density study 09/01/2012  . History of spinal fracture   . Hyperlipidemia, unspecified   . Hypertension   . Hypokalemia   . Hypomagnesemia   . Osteoporosis   . Other dysphagia 01/24/2017  . Pneumonia    1980's  . Protein calorie malnutrition ([place])   . Retinal detachment   . Right shoulder injury 05/2002   treated conservatively  . Trigger finger   . Zenker diverticulum 09/25/2016    HOSPITAL COURSE:   1.  Clinical sepsis present on admission.  Patient had acute metabolic  encephalopathy and extensive right arm cellulitis.  The patient was given Rocephin initially and I added vancomycin.  Right arm cellulitis has improved tremendously.  Now she is able to bend it and move it.  Erythema has settled down quite a bit.  We will do doxycycline orally for 5 more days and Omnicef for 3 more days. 2.  Paroxysmal atrial fibrillation.  Started on Eliquis for anticoagulation.  On Cardizem CD and metoprolol.  Heart rate better today than yesterday.  Seen by Dr. Ubaldo Glassing cardiology. 3.  Dysphagia and throat pain.  I started on viscous lidocaine and Cepacol throat lozenges.  Pain is better today. 4.  Hemorrhoids, anal fissure and stercoral colitis.  Antibiotics helped the stercoral colitis.  Continue steroid cream.  Seen in consultation by Dr. Tollie Pizza who did an anoscopy that showed an anal fissure. 5.  Weakness.  Physical therapy recommends rehab. 6.  Crohn's disease on budesonide 7.  Chronic diarrhea.  Seems to be a little bit better with as needed Imodium. 8.  Depression on Cymbalta 9.  Acute on chronic systolic congestive heart failure.  Echo may under estimate EF with a rapid heart rate.  On oral torsemide and metoprolol.  We will try to add low-dose ACE.  Hopefully spironolactone can be added as outpatient.  Cardiology recommends rechecking an echo as outpatient. 10.  Check pulse ox on room air.  If pulse ox less than 88% can use oxygen as needed at facility. 11.  Can use petroleum jelly for lip ulcerations as needed 12.  Desitin for irritation in the groin and buttock area  DISCHARGE CONDITIONS:   Satisfactory  CONSULTS OBTAINED:  Treatment Team:  Robert Bellow, MD Teodoro Spray, MD  DRUG ALLERGIES:   Allergies  Allergen Reactions  . Oxycodone Other (See Comments)    "makes her crazy"    DISCHARGE MEDICATIONS:   Allergies as of 03/22/2020      Reactions   Oxycodone Other (See Comments)   "makes her crazy"      Medication List    STOP taking these  medications   aspirin EC 81 MG tablet   chlorthalidone 25 MG tablet Commonly known as: HYGROTON   hydrocortisone 2.5 % rectal cream Commonly known as: ANUSOL-HC     TAKE these medications   acetaminophen 500 MG tablet Commonly known as: TYLENOL Take 500 mg by mouth every 8 (eight) hours as needed for mild pain or moderate pain.   apixaban 5 MG Tabs tablet Commonly known as: ELIQUIS Take 1 tablet (5 mg total) by mouth 2 (two) times daily.   ascorbic acid 500 MG tablet Commonly known as: VITAMIN C Take 500 mg by mouth daily.   budesonide 3 MG 24 hr capsule Commonly known as: ENTOCORT EC Take 9 mg by mouth daily.   calcium-vitamin D 500-200 MG-UNIT Tabs tablet Commonly known as: OSCAL WITH D Take 1 tablet by mouth 3 (three) times daily.   cefdinir 300 MG capsule Commonly known as: OMNICEF Take 1 capsule (300 mg total) by mouth 2 (two) times daily for 3 days.   cholecalciferol 25 MCG (1000 UNIT) tablet Commonly known as: VITAMIN D Take 3 tablets by mouth once daily   diltiazem 120 MG 24 hr capsule Commonly known as: CARDIZEM CD Take 1 capsule (120 mg total) by mouth daily.   doxycycline 100 MG tablet Commonly known as: VIBRA-TABS Take 1 tablet (100 mg total) by mouth every 12 (twelve) hours for 5 days.   DULoxetine 20 MG capsule Commonly known as: CYMBALTA Take 20 mg by mouth daily.   gabapentin 300 MG capsule Commonly known as: NEURONTIN Take 1 capsule (300 mg total) by mouth 3 (three) times daily. What changed:   how much to take  how to take this  when to take this  additional instructions   hydrocortisone cream 1 % Apply 3 times a day to anal surface   lidocaine 2 % solution Commonly known as: XYLOCAINE Use as directed 15 mLs in the mouth or throat every 4 (four) hours as needed for mouth pain.   lidocaine 5 % Commonly known as: LIDODERM Apply topically to right knee in the AM and Remove & Discard patch at night within 12 hours or as directed  by MD for uncontrolled knee pain   lisinopril 2.5 MG tablet Commonly known as: ZESTRIL Take 1 tablet (2.5 mg total) by mouth daily.   liver oil-zinc oxide 40 % ointment Commonly known as: DESITIN Apply topically 2 (two) times daily.   Loperamide A-D 2 MG tablet Generic drug: loperamide Take 2 mg by mouth 3 (three) times daily.   magnesium oxide 400 (241.3 Mg) MG tablet Commonly known as: MAG-OX Take 400 mg by mouth daily.   menthol-cetylpyridinium 3 MG lozenge Commonly known as: CEPACOL Take 1 lozenge (3 mg total) by mouth as needed for sore throat.   metoprolol tartrate 25 MG tablet Commonly known as: LOPRESSOR Take 1 tablet (25 mg total) by mouth 2 (two)  times daily.   multivitamin tablet Take 1 tablet by mouth daily.   NexIUM 40 MG capsule Generic drug: esomeprazole Take 40 mg by mouth daily.   potassium chloride SA 20 MEQ tablet Commonly known as: KLOR-CON Take 20 mEq by mouth 2 (two) times daily.   prednisoLONE acetate 1 % ophthalmic suspension Commonly known as: PRED FORTE Place 1 drop into both eyes at bedtime.   torsemide 20 MG tablet Commonly known as: DEMADEX Take 1 tablet (20 mg total) by mouth daily.   traZODone 50 MG tablet Commonly known as: DESYREL Take 1 tablet (50 mg total) by mouth at bedtime as needed for sleep.   Vitamin B-12 5000 MCG Lozg Subl Place 1 tablet under the tongue once dailY   Zinc-220 220 (50 Zn) MG capsule Generic drug: zinc sulfate Take 220 mg by mouth daily.        DISCHARGE INSTRUCTIONS:   Follow-up Dr. Ernst Spell 1 day Follow-up cardiology and CHF clinic  If you experience worsening of your admission symptoms, develop shortness of breath, life threatening emergency, suicidal or homicidal thoughts you must seek medical attention immediately by calling 911 or calling your MD immediately  if symptoms less severe.  You Must read complete instructions/literature along with all the possible adverse reactions/side effects  for all the Medicines you take and that have been prescribed to you. Take any new Medicines after you have completely understood and accept all the possible adverse reactions/side effects.   Please note  You were cared for by a hospitalist during your hospital stay. If you have any questions about your discharge medications or the care you received while you were in the hospital after you are discharged, you can call the unit and asked to speak with the hospitalist on call if the hospitalist that took care of you is not available. Once you are discharged, your primary care physician will handle any further medical issues. Please note that NO REFILLS for any discharge medications will be authorized once you are discharged, as it is imperative that you return to your primary care physician (or establish a relationship with a primary care physician if you do not have one) for your aftercare needs so that they can reassess your need for medications and monitor your lab values.    Today   CHIEF COMPLAINT:   Chief Complaint  Patient presents with  . Weakness    HISTORY OF PRESENT ILLNESS:  Joanne [name]  is a 86 y.o. female came in with weakness and found to have extensive right arm cellulitis.   VITAL SIGNS:  Blood pressure 128/77, pulse 72, temperature (!) 97.5 F (36.4 C), temperature source Oral, resp. rate 20, height 5' (1.524 m), weight 78 kg, SpO2 100 %.  I/O:    Intake/Output Summary (Last 24 hours) at 03/22/2020 0920 Last data filed at 03/22/2020 0500 Gross per 24 hour  Intake 311.26 ml  Output 200 ml  Net 111.26 ml    PHYSICAL EXAMINATION:  GENERAL:  86 y.o.-year-old patient lying in the bed with no acute distress.  EYES: Pupils equal, round, reactive to light and accommodation. No scleral icterus. HEENT: Head atraumatic, normocephalic. Oropharynx and nasopharynx clear.  Lips with crusting and ulceration LUNGS: Decreased breath sounds bilateral bases, no wheezing,  rales,rhonchi or crepitation. No use of accessory muscles of respiration.  CARDIOVASCULAR: S1, S2 normal. No murmurs, rubs, or gallops.  ABDOMEN: Soft, non-tender, non-distended.   EXTREMITIES: Trace pedal edema.  NEUROLOGIC: Cranial nerves II through XII are intact.  Muscle strength 5/5 in all extremities. Sensation intact. Gait not checked.  PSYCHIATRIC: The patient is alert and oriented x 3.  SKIN: Lip ulcerations  DATA REVIEW:   CBC Recent Labs  Lab 03/22/20 0545  WBC 14.9*  HGB 11.0*  HCT 32.9*  PLT 616*    Chemistries  Recent Labs  Lab 03/21/20 0552 03/21/20 0552 03/22/20 0545  NA 137   < > 135  K 4.2   < > 4.2  CL 98   < > 96*  CO2 30   < > 31  GLUCOSE 124*   < > 123*  BUN 11   < > 13  CREATININE 0.65   < > 0.67  CALCIUM 8.1*   < > 8.4*  MG 1.6*  --   --    < > = values in this interval not displayed.    Microbiology Results  Results for orders placed or performed during the hospital encounter of 03/15/20  SARS Coronavirus 2 by RT PCR (hospital order, performed in [facility] hospital lab) Nasopharyngeal Nasopharyngeal Swab     Status: None   Collection Time: 03/15/20  9:41 AM   Specimen: Nasopharyngeal Swab  Result Value Ref Range Status   SARS Coronavirus 2 NEGATIVE NEGATIVE Final    Comment: (NOTE) SARS-CoV-2 target nucleic acids are NOT DETECTED.  The SARS-CoV-2 RNA is generally detectable in upper and lower respiratory specimens during the acute phase of infection. The lowest concentration of SARS-CoV-2 viral copies this assay can detect is 250 copies / mL. A negative result does not preclude SARS-CoV-2 infection and should not be used as the sole basis for treatment or other patient management decisions.  A negative result may occur with improper specimen collection / handling, submission of specimen other than nasopharyngeal swab, presence of viral mutation(s) within the areas targeted by this assay, and inadequate number of viral copies (<250  copies / mL). A negative result must be combined with clinical observations, patient history, and epidemiological information.  Fact Sheet for Patients:   StrictlyIdeas.no  Fact Sheet for Healthcare Providers: BankingDealers.co.za  This test is not yet approved or  cleared by the [place] FDA and has been authorized for detection and/or diagnosis of SARS-CoV-2 by FDA under an Emergency Use Authorization (EUA).  This EUA will remain in effect (meaning this test can be used) for the duration of the COVID-19 declaration under Section 564(b)(1) of the Act, 21 U.S.C. section 360bbb-3(b)(1), unless the authorization is terminated or revoked sooner.  Performed at [facility], [address] [place], [place] [address]   Culture, blood (x 2)     Status: None   Collection Time: 03/15/20  9:41 AM   Specimen: BLOOD  Result Value Ref Range Status   Specimen Description BLOOD RIGHT ANTECUBITAL  Final   Special Requests   Final    BOTTLES DRAWN AEROBIC AND ANAEROBIC Blood Culture results may not be optimal due to an excessive volume of blood received in culture bottles   Culture   Final    NO GROWTH 5 DAYS Performed at [facility], [address] [place], [place] [address]    Report Status 03/20/2020 FINAL  Final  Culture, blood (x 2)     Status: None   Collection Time: 03/15/20  9:41 AM   Specimen: BLOOD  Result Value Ref Range Status   Specimen Description BLOOD LEFT ANTECUBITAL  Final   Special Requests   Final    BOTTLES DRAWN AEROBIC AND  ANAEROBIC Blood Culture results may not be optimal due to an excessive volume of blood received in culture bottles   Culture   Final    NO GROWTH 5 DAYS Performed at [facility], [address] [place], [place] [address]    Report Status 03/20/2020 FINAL  Final  C Difficile Quick Screen w PCR reflex     Status: None   Collection Time: 03/16/20 11:34 AM    Specimen: STOOL  Result Value Ref Range Status   C Diff antigen NEGATIVE NEGATIVE Final   C Diff toxin NEGATIVE NEGATIVE Final   C Diff interpretation No C. difficile detected.  Final    Comment: Performed at [facility], [address] [place],  [address]    RADIOLOGY:  [facility] Chest Port 1 View  Result Date: 03/20/2020 CLINICAL DATA:  Cough and shortness of breath beginning yesterday. EXAM: PORTABLE CHEST 1 VIEW COMPARISON:  04/01/2017 FINDINGS: Lordotic technique is demonstrated. Lungs are hypoinflated with hazy opacification over the left base/retrocardiac region which may be due to effusion/atelectasis versus infection. Linear atelectasis over the left midlung. Mild prominence of the central perihilar markings. Mild stable cardiomegaly. Remainder of the exam is unchanged. IMPRESSION: 1. Left base opacification which may be due to atelectasis/effusion versus infection. Linear atelectasis left midlung. 2.  Stable cardiomegaly with possible minimal vascular congestion. Electronically Signed   By: Marin Olp M.D.   On: 03/20/2020 11:17   ECHOCARDIOGRAM COMPLETE  Result Date: 03/21/2020    ECHOCARDIOGRAM REPORT   Patient Name:   Joanne [name] Date of Exam: 03/20/2020 Medical Rec #:  [id number]     Height:       60.0 in Accession #:    [id number]    Weight:       172.0 lb Date of Birth:  April 27, 1931    BSA:          1.751 m Patient Age:    20 years      BP:           131/78 mmHg Patient Gender: F             HR:           91 bpm. Exam Location:  [facility] Procedure: 2D Echo, Cardiac Doppler and Color Doppler Indications:     Atrial Fibrillation 427.31 / I48.91  History:         Patient has no prior history of Echocardiogram examinations.                  Signs/Symptoms:Shortness of Breath; Risk Factors:Hypertension.  Sonographer:     Alyse Low Roar Referring Phys:  [id number] Loletha Grayer Diagnosing Phys: Bartholome Bill MD IMPRESSIONS  1. Left ventricular ejection fraction, by estimation, is 30 to  35%. The left ventricle has moderate to severely decreased function. The left ventricle demonstrates global hypokinesis. Left ventricular diastolic parameters were normal.  2. Right ventricular systolic function is normal. The right ventricular size is normal. There is normal pulmonary artery systolic pressure.  3. Moderate pleural effusion in the left lateral region.  4. The mitral valve was not well visualized. Mild to moderate mitral valve regurgitation.  5. The aortic valve was not well visualized. Aortic valve regurgitation is not visualized. FINDINGS  Left Ventricle: Left ventricular ejection fraction, by estimation, is 30 to 35%. The left ventricle has moderate to severely decreased function. The left ventricle demonstrates global hypokinesis. The left ventricular internal cavity size was normal in size. There is no  left ventricular hypertrophy. Left ventricular diastolic parameters were normal. Right Ventricle: The right ventricular size is normal. No increase in right ventricular wall thickness. Right ventricular systolic function is normal. There is normal pulmonary artery systolic pressure. The tricuspid regurgitant velocity is 2.38 m/s, and  with an assumed right atrial pressure of 10 mmHg, the estimated right ventricular systolic pressure is 49.4 mmHg. Left Atrium: Left atrial size was normal in size. Right Atrium: Right atrial size was normal in size. Pericardium: There is no evidence of pericardial effusion. Mitral Valve: The mitral valve was not well visualized. Mild to moderate mitral valve regurgitation. Tricuspid Valve: The tricuspid valve is not well visualized. Tricuspid valve regurgitation is mild. Aortic Valve: The aortic valve was not well visualized. Aortic valve regurgitation is not visualized. Aortic valve mean gradient measures 3.0 mmHg. Aortic valve peak gradient measures 6.4 mmHg. Aortic valve area, by VTI measures 2.19 cm. Pulmonic Valve: The pulmonic valve was not well visualized.  Pulmonic valve regurgitation is trivial. Aorta: The aortic root was not well visualized. IAS/Shunts: The interatrial septum was not assessed. Additional Comments: There is a moderate pleural effusion in the left lateral region.  LEFT VENTRICLE PLAX 2D LVIDd:         4.82 cm      Diastology LVIDs:         4.16 cm      LV e' lateral:   4.35 cm/s LV PW:         1.02 cm      LV E/e' lateral: 28.0 LV IVS:        1.13 cm      LV e' medial:    3.81 cm/s LVOT diam:     2.10 cm      LV E/e' medial:  32.0 LV SV:         47 LV SV Index:   27 LVOT Area:     3.46 cm  LV Volumes (MOD) LV vol d, MOD A2C: 143.0 ml LV vol d, MOD A4C: 123.0 ml LV vol s, MOD A2C: 99.3 ml LV vol s, MOD A4C: 86.1 ml LV SV MOD A2C:     43.7 ml LV SV MOD A4C:     123.0 ml LV SV MOD BP:      44.1 ml RIGHT VENTRICLE RV Mid diam:    3.04 cm RV S prime:     9.46 cm/s TAPSE (M-mode): 1.7 cm LEFT ATRIUM             Index       RIGHT ATRIUM           Index LA diam:        4.35 cm 2.48 cm/m  RA Area:     19.10 cm LA Vol (A2C):   70.8 ml 40.44 ml/m RA Volume:   44.50 ml  25.42 ml/m LA Vol (A4C):   77.5 ml 44.27 ml/m LA Biplane Vol: 74.8 ml 42.73 ml/m  AORTIC VALVE                   PULMONIC VALVE AV Area (Vmax):    2.12 cm    PV Vmax:        0.72 m/s AV Area (Vmean):   2.17 cm    PV Peak grad:   2.1 mmHg AV Area (VTI):     2.19 cm    RVOT Peak grad: 1 mmHg AV Vmax:           126.00  cm/s AV Vmean:          83.800 cm/s AV VTI:            0.215 m AV Peak Grad:      6.4 mmHg AV Mean Grad:      3.0 mmHg LVOT Vmax:         77.30 cm/s LVOT Vmean:        52.600 cm/s LVOT VTI:          0.136 m LVOT/AV VTI ratio: 0.63  AORTA Ao Root diam: 2.80 cm MITRAL VALVE                TRICUSPID VALVE MV Area (PHT): 8.43 cm     TR Peak grad:   22.7 mmHg MV Decel Time: 90 msec      TR Vmax:        238.00 cm/s MV E velocity: 122.00 cm/s MV A velocity: 118.00 cm/s  SHUNTS MV E/A ratio:  1.03         Systemic VTI:  0.14 m MV A Prime:    8.9 cm/s     Systemic Diam: 2.10 cm  Bartholome Bill MD Electronically signed by Bartholome Bill MD Signature Date/Time: 03/21/2020/6:41:04 AM    Final      Management plans discussed with the patient, family and they are in agreement.  CODE STATUS:     Code Status Orders  (From admission, onward)         Start     Ordered   03/15/20 1525  Full code  Continuous        03/15/20 1527        Code Status History    Date Active Date Inactive Code Status Order ID Comments User Context   08/11/2018 1854 08/13/2018 2032 Full Code [id number]  Henreitta Leber, MD Inpatient   08/11/2018 1737 08/11/2018 1854 DNR [id number]  Saundra Shelling, MD Inpatient   08/11/2018 1735 08/11/2018 1736 Full Code [id number]  Saundra Shelling, MD Inpatient   12/23/2017 1137 12/25/2017 1808 Full Code [id number]  Dereck Leep, MD Inpatient   01/22/2017 1502 01/23/2017 1413 Full Code [id number]  Hillary Bow, MD ED   02/21/2015 1939 02/23/2015 1908 Full Code [id number]  Loletha Grayer, MD ED   Advance Care Planning Activity      TOTAL TIME TAKING CARE OF THIS PATIENT: 35 minutes.    Loletha Grayer M.D on 03/22/2020 at 9:20 AM  Between 7am to 6pm - Pager - [phone]  After 6pm go to [email or web address] - password [email or web address]  [facility]  CC: Primary care physician; Tracie Harrier, MD

## 2020-03-22 NOTE — Progress Notes (Signed)
Patient Name: Joanne [name] Date of Encounter: 03/22/2020  Hospital Problem List     Principal Problem:   Right arm cellulitis Active Problems:   GERD without esophagitis   Sepsis ([place])   Crohn disease ([place])   Depression   HTN (hypertension)   Proctitis   Cellulitis of lower extremity   Generalized weakness   Urinary retention   Hemorrhoids   Anal fissure   Wheeze   Atrial fibrillation with RVR (HCC)   AF (paroxysmal atrial fibrillation) (HCC)   Acute on chronic systolic CHF (congestive heart failure) [facility])    Patient Profile     86 year old female with history of chronic congestive heart failure and paroxysmal atrial fibrillation admitted with right arm cellulitis.  Developed A. fib with RVR during admission.  Was treated with metoprolol and Cardizem.  Has converted back to sinus rhythm.  Subjective   Feels better.  Inpatient Medications     apixaban  5 mg Oral BID   ascorbic acid  500 mg Oral Daily   budesonide  9 mg Oral Daily   budesonide (PULMICORT) nebulizer solution  0.5 mg Nebulization BID   calcium-vitamin D  1 tablet Oral TID   diltiazem  30 mg Oral Q6H   DULoxetine  20 mg Oral Daily   gabapentin  300 mg Oral TID   hydrocortisone   Topical TID   hydrocortisone  25 mg Rectal BID   hydrocortisone cream  1 application Topical TID   ipratropium-albuterol  3 mL Nebulization TID   lidocaine  1 patch Transdermal Q24H   liver oil-zinc oxide   Topical BID   magnesium oxide  400 mg Oral Daily   menthol-cetylpyridinium  1 lozenge Oral Q4H   metoprolol tartrate  25 mg Oral BID   multivitamin with minerals   Oral Daily   pantoprazole  40 mg Oral Daily   potassium chloride  20 mEq Oral BID   prednisoLONE acetate  1 drop Both Eyes QHS   torsemide  20 mg Oral Daily   zinc sulfate  220 mg Oral Daily    Vital Signs    Vitals:   03/21/20 2007 03/21/20 2235 03/22/20 0011 03/22/20 0448  BP:  (!) 125/60 (!) 116/62 128/77  Pulse:  93  72   Resp:    20  Temp:    (!) 97.5 F (36.4 C)  TempSrc:    Oral  SpO2: 94%   100%  Weight:      Height:        Intake/Output Summary (Last 24 hours) at 03/22/2020 0820 Last data filed at 03/22/2020 0500 Gross per 24 hour  Intake 431.26 ml  Output 200 ml  Net 231.26 ml   Filed Weights   03/15/20 0853  Weight: 78 kg    Physical Exam    GEN: Well nourished, well developed, in no acute distress.  HEENT: normal.  Neck: Supple, no JVD, carotid bruits, or masses. Cardiac: RRR, no murmurs, rubs, or gallops. No clubbing, cyanosis, edema.  Radials/DP/PT 2+ and equal bilaterally.  Respiratory:  Respirations regular and unlabored, clear to auscultation bilaterally. GI: Soft, nontender, nondistended, BS + x 4. MS: no deformity or atrophy. Skin: warm and dry, no rash. Neuro:  Strength and sensation are intact. Psych: Normal affect.  Labs    CBC Recent Labs    03/21/20 0552 03/22/20 0545  WBC 13.4* 14.9*  HGB 10.1* 11.0*  HCT 31.0* 32.9*  MCV 86.1 85.5  PLT 614* 616*   Basic  Metabolic Panel Recent Labs    03/21/20 0552 03/22/20 0545  NA 137 135  K 4.2 4.2  CL 98 96*  CO2 30 31  GLUCOSE 124* 123*  BUN 11 13  CREATININE 0.65 0.67  CALCIUM 8.1* 8.4*  MG 1.6*  --    Liver Function Tests No results for input(s): AST, ALT, ALKPHOS, BILITOT, PROT, ALBUMIN in the last 72 hours. No results for input(s): LIPASE, AMYLASE in the last 72 hours. Cardiac Enzymes No results for input(s): CKTOTAL, CKMB, CKMBINDEX, TROPONINI in the last 72 hours. BNP No results for input(s): BNP in the last 72 hours. D-Dimer No results for input(s): DDIMER in the last 72 hours. Hemoglobin A1C No results for input(s): HGBA1C in the last 72 hours. Fasting Lipid Panel No results for input(s): CHOL, HDL, LDLCALC, TRIG, CHOLHDL, LDLDIRECT in the last 72 hours. Thyroid Function Tests No results for input(s): TSH, T4TOTAL, T3FREE, THYROIDAB in the last 72 hours.  Invalid input(s):  FREET3  Telemetry    Sinus rhythm  ECG    A. fib with RVR  Radiology    CT Head Wo Contrast  Result Date: 03/15/2020 CLINICAL DATA:  Head trauma, headache. Additional history provided: Weakness and urinary retention for 4 days. EXAM: CT HEAD WITHOUT CONTRAST TECHNIQUE: Contiguous axial images were obtained from the base of the skull through the vertex without intravenous contrast. COMPARISON:  Prior head CT examination 02/23/2018 and earlier FINDINGS: Brain: Stable, mild-to-moderate generalized parenchymal atrophy. Stable, minimal ill-defined hypoattenuation within the cerebral white matter is nonspecific, but consistent with chronic small vessel ischemic disease. There is no acute intracranial hemorrhage. No demarcated cortical infarct. No extra-axial fluid collection. No evidence of intracranial mass. No midline shift. Redemonstrated nonspecific prominent left frontal dural calcification. Vascular: No hyperdense vessel.  Atherosclerotic calcifications Skull: Normal. Negative for fracture or focal lesion. Sinuses/Orbits: Left scleral buckle. Visualized orbits show no acute finding. Mild ethmoid, left sphenoid and right maxillary sinus mucosal thickening at the imaged levels. No significant mastoid effusion. IMPRESSION: No CT evidence of acute intracranial abnormality. Stable, mild-to-moderate generalized parenchymal atrophy and mild chronic small vessel ischemic disease. Mild paranasal sinus mucosal thickening at the imaged levels. Electronically Signed   By: Kellie Simmering DO   On: 03/15/2020 10:39   CT ABDOMEN PELVIS W CONTRAST  Result Date: 03/15/2020 CLINICAL DATA:  Abdominal pain, perirectal abscess suspected with swelling. EXAM: CT ABDOMEN AND PELVIS WITH CONTRAST TECHNIQUE: Multidetector CT imaging of the abdomen and pelvis was performed using the standard protocol following bolus administration of intravenous contrast. CONTRAST:  11m OMNIPAQUE IOHEXOL 300 MG/ML  SOLN COMPARISON:   05/14/2018 FINDINGS: Lower chest: Small RIGHT-sided pleural effusion is new from the previous exam. Associated with mild basilar volume loss. Signs of RIGHT coronary intervention. Heart size mildly enlarged.  No pericardial fluid. Hepatobiliary: Hepatic steatosis. Portal vein is patent. Post cholecystectomy. No biliary duct dilation. Pancreas: Pancreatic atrophy. No peripancreatic inflammation or duct dilation. Spleen: Spleen normal size and contour. Adrenals/Urinary Tract: Adrenal glands are normal. Mild cortical scarring of the kidneys bilaterally without signs of hydronephrosis. Small low-density lesion in the upper pole the LEFT kidney likely a small cyst Small filling defect in the RIGHT renal pelvis versus contrast mixing. There is very subtle asymmetry of enhancement in this area as well best seen on sagittal image 59 of series 509. Question of filling defects seen on image 24 of delayed phase imaging in the axial plane. This areas incompletely opacified with limited assessment. Urinary bladder is markedly distended. Distal  ureter select distal ureter distal ureteral evaluation hampered by susceptibility artifact from the patient's hip arthroplasty changes in the bilateral hips. Stomach/Bowel: Marked distension of the rectum filling the pelvic outlet measuring approximately 8.2 x 8.1 cm. Perirectal stranding. Bowing of levator in I suggests pelvic floor dysfunction. Imaging does not extend entirely through the perineum. No abscess is visualized in the imaged portions of the abdomen and pelvis. Partially formed stool and liquid stool seen elsewhere in the colon. Mild distension of bowel loops in the RIGHT lower quadrant. Calcifications in the RIGHT lower quadrant mesentery with similar appearance to study from 2019. Distal ileal mural stratification and narrowing best noted on image 51 of series 504 alternating areas of dilation and narrowing seen proximal to this, pattern is similar to previous imaging.  Stomach is under distended. Vascular/Lymphatic: Calcified and noncalcified plaque in the abdominal aorta without aneurysmal dilation. No adenopathy in the retroperitoneum or in the upper abdomen. No pelvic lymphadenopathy. Reproductive: Nonspecific, unremarkable CT appearance of uterus and adnexa. Other: No ascites.  No abscess. Musculoskeletal: Cement augmentation of the L3 vertebral body. Compression fracture at T9 with a chronic appearance. IMPRESSION: 1. Finding of stercoral colitis/proctitis with perianal inflammation and descent of pelvic floor structures. Anal orifice and perianal regions are incompletely imaged. 2. Contrast mixing versus small filling defect in the RIGHT renal pelvis. Follow-up with urine cytology and urologic evaluation on a nonemergent basis may be helpful with hematuria evaluation as warranted. 3. Chronic areas of small bowel narrowing and dilation in the setting of Crohn's disease. No signs of perienteric stranding. Mild acute on chronic changes are however considered given appearance. Areas of more proximal ileal distension are slightly increased but without frank evidence of obstruction. 4. Moderate to marked distension of the urinary bladder could be secondarily related to rectal distension but there is no current hydronephrosis. 5. Hepatic steatosis. 6. New small RIGHT effusion. 7. Aortic atherosclerosis. Aortic Atherosclerosis (ICD10-I70.0). Electronically Signed   By: Zetta Bills M.D.   On: 03/15/2020 11:08   US Venous Img Lower Bilateral (DVT)  Result Date: 03/16/2020 CLINICAL DATA:  86 year old female with a history of cellulitis EXAM: BILATERAL LOWER EXTREMITY VENOUS DOPPLER ULTRASOUND TECHNIQUE: Gray-scale sonography with graded compression, as well as color Doppler and duplex ultrasound were performed to evaluate the lower extremity deep venous systems from the level of the common femoral vein and including the common femoral, femoral, profunda femoral, popliteal and  calf veins including the posterior tibial, peroneal and gastrocnemius veins when visible. The superficial great saphenous vein was also interrogated. Spectral Doppler was utilized to evaluate flow at rest and with distal augmentation maneuvers in the common femoral, femoral and popliteal veins. COMPARISON:  None. FINDINGS: RIGHT LOWER EXTREMITY Common Femoral Vein: No evidence of thrombus. Normal compressibility, respiratory phasicity and response to augmentation. Saphenofemoral Junction: No evidence of thrombus. Normal compressibility and flow on color Doppler imaging. Profunda Femoral Vein: No evidence of thrombus. Normal compressibility and flow on color Doppler imaging. Femoral Vein: No evidence of thrombus. Normal compressibility, respiratory phasicity and response to augmentation. Popliteal Vein: No evidence of thrombus. Normal compressibility, respiratory phasicity and response to augmentation. Calf Veins: No evidence of thrombus. Normal compressibility and flow on color Doppler imaging. Superficial Great Saphenous Vein: No evidence of thrombus. Normal compressibility and flow on color Doppler imaging. Other Findings:  None. LEFT LOWER EXTREMITY Common Femoral Vein: No evidence of thrombus. Normal compressibility, respiratory phasicity and response to augmentation. Saphenofemoral Junction: No evidence of thrombus. Normal compressibility and flow on  color Doppler imaging. Profunda Femoral Vein: No evidence of thrombus. Normal compressibility and flow on color Doppler imaging. Femoral Vein: No evidence of thrombus. Normal compressibility, respiratory phasicity and response to augmentation. Popliteal Vein: No evidence of thrombus. Normal compressibility, respiratory phasicity and response to augmentation. Calf Veins: No evidence of thrombus. Normal compressibility and flow on color Doppler imaging. Superficial Great Saphenous Vein: No evidence of thrombus. Normal compressibility and flow on color Doppler  imaging. Other Findings:  None. IMPRESSION: Sonographic survey of the bilateral lower extremities negative for DVT Electronically Signed   By: Corrie Mckusick D.O.   On: 03/16/2020 11:25   US Venous Img Upper Uni Right(DVT)  Result Date: 03/16/2020 CLINICAL DATA:  86 year old female with a history of cellulitis EXAM: RIGHT UPPER EXTREMITY VENOUS DOPPLER ULTRASOUND TECHNIQUE: Gray-scale sonography with graded compression, as well as color Doppler and duplex ultrasound were performed to evaluate the upper extremity deep venous system from the level of the subclavian vein and including the jugular, axillary, basilic, radial, ulnar and upper cephalic vein. Spectral Doppler was utilized to evaluate flow at rest and with distal augmentation maneuvers. COMPARISON:  None. FINDINGS: Contralateral Subclavian Vein: Respiratory phasicity is normal and symmetric with the symptomatic side. No evidence of thrombus. Normal compressibility. Internal Jugular Vein: No evidence of thrombus. Normal compressibility, respiratory phasicity and response to augmentation. Subclavian Vein: No evidence of thrombus. Normal compressibility, respiratory phasicity and response to augmentation. Axillary Vein: No evidence of thrombus. Normal compressibility, respiratory phasicity and response to augmentation. Cephalic Vein: No evidence of thrombus. Normal compressibility, respiratory phasicity and response to augmentation. Basilic Vein: No evidence of thrombus. Normal compressibility, respiratory phasicity and response to augmentation. Brachial Veins: No evidence of thrombus. Normal compressibility, respiratory phasicity and response to augmentation. Radial Veins: No evidence of thrombus. Normal compressibility, respiratory phasicity and response to augmentation. Ulnar Veins: No evidence of thrombus. Normal compressibility, respiratory phasicity and response to augmentation. Other Findings:  Edema IMPRESSION: Sonographic survey of the right upper  extremity negative for DVT Edema Electronically Signed   By: Corrie Mckusick D.O.   On: 03/16/2020 11:26   DG Chest Port 1 View  Result Date: 03/20/2020 CLINICAL DATA:  Cough and shortness of breath beginning yesterday. EXAM: PORTABLE CHEST 1 VIEW COMPARISON:  04/01/2017 FINDINGS: Lordotic technique is demonstrated. Lungs are hypoinflated with hazy opacification over the left base/retrocardiac region which may be due to effusion/atelectasis versus infection. Linear atelectasis over the left midlung. Mild prominence of the central perihilar markings. Mild stable cardiomegaly. Remainder of the exam is unchanged. IMPRESSION: 1. Left base opacification which may be due to atelectasis/effusion versus infection. Linear atelectasis left midlung. 2.  Stable cardiomegaly with possible minimal vascular congestion. Electronically Signed   By: Marin Olp M.D.   On: 03/20/2020 11:17   ECHOCARDIOGRAM COMPLETE  Result Date: 03/21/2020    ECHOCARDIOGRAM REPORT   Patient Name:   Joanne [name] Date of Exam: 03/20/2020 Medical Rec #:  [id number]     Height:       60.0 in Accession #:    [id number]    Weight:       172.0 lb Date of Birth:  Mar 15, 1931    BSA:          1.751 m Patient Age:    86 years      BP:           131/78 mmHg Patient Gender: F             HR:  91 bpm. Exam Location:  [facility] Procedure: 2D Echo, Cardiac Doppler and Color Doppler Indications:     Atrial Fibrillation 427.31 / I48.91  History:         Patient has no prior history of Echocardiogram examinations.                  Signs/Symptoms:Shortness of Breath; Risk Factors:Hypertension.  Sonographer:     Alyse Low Roar Referring Phys:  [id number] Loletha Grayer Diagnosing Phys: Bartholome Bill MD IMPRESSIONS  1. Left ventricular ejection fraction, by estimation, is 30 to 35%. The left ventricle has moderate to severely decreased function. The left ventricle demonstrates global hypokinesis. Left ventricular diastolic parameters were normal.  2. Right  ventricular systolic function is normal. The right ventricular size is normal. There is normal pulmonary artery systolic pressure.  3. Moderate pleural effusion in the left lateral region.  4. The mitral valve was not well visualized. Mild to moderate mitral valve regurgitation.  5. The aortic valve was not well visualized. Aortic valve regurgitation is not visualized. FINDINGS  Left Ventricle: Left ventricular ejection fraction, by estimation, is 30 to 35%. The left ventricle has moderate to severely decreased function. The left ventricle demonstrates global hypokinesis. The left ventricular internal cavity size was normal in size. There is no left ventricular hypertrophy. Left ventricular diastolic parameters were normal. Right Ventricle: The right ventricular size is normal. No increase in right ventricular wall thickness. Right ventricular systolic function is normal. There is normal pulmonary artery systolic pressure. The tricuspid regurgitant velocity is 2.38 m/s, and  with an assumed right atrial pressure of 10 mmHg, the estimated right ventricular systolic pressure is 86.7 mmHg. Left Atrium: Left atrial size was normal in size. Right Atrium: Right atrial size was normal in size. Pericardium: There is no evidence of pericardial effusion. Mitral Valve: The mitral valve was not well visualized. Mild to moderate mitral valve regurgitation. Tricuspid Valve: The tricuspid valve is not well visualized. Tricuspid valve regurgitation is mild. Aortic Valve: The aortic valve was not well visualized. Aortic valve regurgitation is not visualized. Aortic valve mean gradient measures 3.0 mmHg. Aortic valve peak gradient measures 6.4 mmHg. Aortic valve area, by VTI measures 2.19 cm. Pulmonic Valve: The pulmonic valve was not well visualized. Pulmonic valve regurgitation is trivial. Aorta: The aortic root was not well visualized. IAS/Shunts: The interatrial septum was not assessed. Additional Comments: There is a moderate  pleural effusion in the left lateral region.  LEFT VENTRICLE PLAX 2D LVIDd:         4.82 cm      Diastology LVIDs:         4.16 cm      LV e' lateral:   4.35 cm/s LV PW:         1.02 cm      LV E/e' lateral: 28.0 LV IVS:        1.13 cm      LV e' medial:    3.81 cm/s LVOT diam:     2.10 cm      LV E/e' medial:  32.0 LV SV:         47 LV SV Index:   27 LVOT Area:     3.46 cm  LV Volumes (MOD) LV vol d, MOD A2C: 143.0 ml LV vol d, MOD A4C: 123.0 ml LV vol s, MOD A2C: 99.3 ml LV vol s, MOD A4C: 86.1 ml LV SV MOD A2C:     43.7 ml LV SV MOD A4C:  123.0 ml LV SV MOD BP:      44.1 ml RIGHT VENTRICLE RV Mid diam:    3.04 cm RV S prime:     9.46 cm/s TAPSE (M-mode): 1.7 cm LEFT ATRIUM             Index       RIGHT ATRIUM           Index LA diam:        4.35 cm 2.48 cm/m  RA Area:     19.10 cm LA Vol (A2C):   70.8 ml 40.44 ml/m RA Volume:   44.50 ml  25.42 ml/m LA Vol (A4C):   77.5 ml 44.27 ml/m LA Biplane Vol: 74.8 ml 42.73 ml/m  AORTIC VALVE                   PULMONIC VALVE AV Area (Vmax):    2.12 cm    PV Vmax:        0.72 m/s AV Area (Vmean):   2.17 cm    PV Peak grad:   2.1 mmHg AV Area (VTI):     2.19 cm    RVOT Peak grad: 1 mmHg AV Vmax:           126.00 cm/s AV Vmean:          83.800 cm/s AV VTI:            0.215 m AV Peak Grad:      6.4 mmHg AV Mean Grad:      3.0 mmHg LVOT Vmax:         77.30 cm/s LVOT Vmean:        52.600 cm/s LVOT VTI:          0.136 m LVOT/AV VTI ratio: 0.63  AORTA Ao Root diam: 2.80 cm MITRAL VALVE                TRICUSPID VALVE MV Area (PHT): 8.43 cm     TR Peak grad:   22.7 mmHg MV Decel Time: 90 msec      TR Vmax:        238.00 cm/s MV E velocity: 122.00 cm/s MV A velocity: 118.00 cm/s  SHUNTS MV E/A ratio:  1.03         Systemic VTI:  0.14 m MV A Prime:    8.9 cm/s     Systemic Diam: 2.10 cm Bartholome Bill MD Electronically signed by Bartholome Bill MD Signature Date/Time: 03/21/2020/6:41:04 AM    Final     Assessment & Plan    Atrial fibrillation-has converted back to sinus  rhythm with Cardizem and metoprolol.  Is tolerating apixaban at 5 mg twice daily. Echo showed reduced LV function.  May be related to her tachycardia.  Would continue with diltiazem at 120 mg daily along with apixaban at 5 mg twice daily and metoprolol tartrate 25 mg twice daily.  We will be happy to see as outpatient follow-up in 1 week after discharge.  Further work-up of her cardiomyopathy will be  carried out as an outpatient.  We will follow up echo to determine if EF is stable. Signed, Javier Docker Jessica Checketts MD 03/22/2020, 8:20 AM  Pager: [phone]

## 2020-03-22 NOTE — TOC Progression Note (Signed)
Transition of Care [facility]) - Progression Note    Patient Details  Name: JANIFER GIESELMAN MRN: [id number] Date of Birth: 07-24-1931  [facility] CM/SW Contact  [name], RN Phone Number: 03/22/2020, 1:57 PM  Clinical Narrative:    Update:  Discharge cancelled.  Ricky at [employer] expires today.  Will restart auth when patient closer to being medically stable    Expected Discharge Plan: [place] Barriers to Discharge: No Barriers Identified  Expected Discharge Plan and Services Expected Discharge Plan: [place]   Discharge Planning Services: CM Consult   Living arrangements for the past 2 months: Single Family Home Expected Discharge Date: 03/22/20                                     Social Determinants of Health (SDOH) Interventions    Readmission Risk Interventions No flowsheet data found.

## 2020-03-23 ENCOUNTER — Inpatient Hospital Stay: Payer: Medicare Other

## 2020-03-23 DIAGNOSIS — L03113 Cellulitis of right upper limb: Secondary | ICD-10-CM | POA: Diagnosis not present

## 2020-03-23 LAB — CBC
HCT: 33.3 % — ABNORMAL LOW (ref 36.0–46.0)
Hemoglobin: 10.3 g/dL — ABNORMAL LOW (ref 12.0–15.0)
MCH: 27.7 pg (ref 26.0–34.0)
MCHC: 30.9 g/dL (ref 30.0–36.0)
MCV: 89.5 fL (ref 80.0–100.0)
Platelets: 654 10*3/uL — ABNORMAL HIGH (ref 150–400)
RBC: 3.72 MIL/uL — ABNORMAL LOW (ref 3.87–5.11)
RDW: 16.7 % — ABNORMAL HIGH (ref 11.5–15.5)
WBC: 11.4 10*3/uL — ABNORMAL HIGH (ref 4.0–10.5)
nRBC: 0 % (ref 0.0–0.2)

## 2020-03-23 LAB — BODY FLUID CELL COUNT WITH DIFFERENTIAL
Eos, Fluid: 0 %
Lymphs, Fluid: 14 %
Monocyte-Macrophage-Serous Fluid: 22 %
Neutrophil Count, Fluid: 63 %
Total Nucleated Cell Count, Fluid: 341 uL

## 2020-03-23 LAB — BASIC METABOLIC PANEL WITH GFR
Anion gap: 11 (ref 5–15)
BUN: 14 mg/dL (ref 8–23)
CO2: 31 mmol/L (ref 22–32)
Calcium: 8.1 mg/dL — ABNORMAL LOW (ref 8.9–10.3)
Chloride: 91 mmol/L — ABNORMAL LOW (ref 98–111)
Creatinine, Ser: 0.84 mg/dL (ref 0.44–1.00)
GFR calc Af Amer: >60 mL/min (ref >60)
GFR calc non Af Amer: >60 mL/min (ref >60)
Glucose, Bld: 139 mg/dL — ABNORMAL HIGH (ref 70–99)
Potassium: 3.9 mmol/L (ref 3.5–5.1)
Sodium: 133 mmol/L — ABNORMAL LOW (ref 135–145)

## 2020-03-23 LAB — MAGNESIUM: Magnesium: 1.4 mg/dL — ABNORMAL LOW (ref 1.7–2.4)

## 2020-03-23 MED ORDER — MAGNESIUM SULFATE 2 GM/50ML IV SOLN
2.0000 g | Freq: Once | INTRAVENOUS | Status: AC
  Administered 2020-03-23: 2 g via INTRAVENOUS
  Filled 2020-03-23: qty 50

## 2020-03-23 NOTE — Progress Notes (Signed)
PT Cancellation Note  Patient Details Name: Joanne [name] MRN: [id number] DOB: 1930-10-30   Cancelled Treatment:     PT attempt. Pt was off floor for US guided thoracentesis. Acute PT will continue to follow per POC.    Willette Pa 03/23/2020, 2:55 PM

## 2020-03-23 NOTE — Progress Notes (Signed)
Initial Nutrition Assessment  DOCUMENTATION CODES:   Obesity unspecified  INTERVENTION:   MVI daily   Liberalize diet   NUTRITION DIAGNOSIS:   Inadequate oral intake related to acute illness as evidenced by per patient/family report  GOAL:   Patient will meet greater than or equal to 90% of their needs  MONITOR:   PO intake, Supplement acceptance, Labs, Weight trends, Skin, I & O's  REASON FOR ASSESSMENT:   Malnutrition Screening Tool    ASSESSMENT:   86 y.o. female with medical history significant of hypertension, hyperlipidemia, GERD, depression, Fuch's endothelial dystrophy, Crohn's disease, anemia, Zenker's diverticulum, who presents with pain, erythema and swelling in the right arm and the legs.   Met with pt and daughter in room today. Pt sleeping at time of RD visit so history obtained from pt's daughter at bedside. Daughter reports pt with good appetite and oral intake at baseline. Pt with decreased appetite and oral intake for several days pta but daughter reports that pt's appetite is improving. Pt documented to be eating 40-90% of meals in hospital. Pt ate pretty well at breakfast today with encouragement from her daughter. Pt with h/o Crohn's disease. Per daughter, pt does not avoid any certain foods but reports that she does not drink supplements as they give her diarrhea. Per chart, pt appears weight stable at baseline. RD will liberalize pt's diet. Recommend continue daily multivitamin in setting of Crohn's disease.    Medications reviewed and include: Vitamin C, oscal w/ D, lasix, Mg oxide, MVI, protonix, KCl, zinc  Labs reviewed: Na 133(L), Mg 1.4(L) Wbc- 11.4(H), Hgb 10.3(L), Hct 33.3(L)  NUTRITION - FOCUSED PHYSICAL EXAM:    Most Recent Value  Orbital Region No depletion  Upper Arm Region No depletion  Thoracic and Lumbar Region No depletion  Buccal Region No depletion  Temple Region No depletion  Clavicle Bone Region No depletion  Clavicle and  Acromion Bone Region No depletion  Scapular Bone Region No depletion  Dorsal Hand No depletion  Patellar Region No depletion  Anterior Thigh Region No depletion  Posterior Calf Region No depletion  Edema (RD Assessment) Moderate  Hair Reviewed  Eyes Reviewed  Mouth Reviewed  Skin Reviewed  Nails Reviewed     Diet Order:   Diet Order            Diet 2 gram sodium Room service appropriate? Yes; Fluid consistency: Thin  Diet effective now                EDUCATION NEEDS:   Education needs have been addressed  Skin:  Skin Assessment: Reviewed RN Assessment (ecchymosis, MASD)  Last BM:  7/28- type 6  Height:   Ht Readings from Last 1 Encounters:  03/15/20 5' (1.524 m)    Weight:   Wt Readings from Last 1 Encounters:  03/15/20 78 kg    Ideal Body Weight:  45.4 kg  BMI:  Body mass index is 33.59 kg/m.  Estimated Nutritional Needs:   Kcal:  1600-1800kcal/day  Protein:  80-90g/day  Fluid:  1.4-1.6L/day  Koleen Distance MS, RD, LDN Please refer to [facility] for RD and/or RD on-call/weekend/after hours pager

## 2020-03-23 NOTE — Progress Notes (Signed)
[place] visited pt. as follow-up from visit yesterday and to evaluate pt.'s capacity to sign AD if willing to today; pt. just returned from procedure to take fluid off her lungs, dtr. Vicky said, when [facility] arrived.  Pt. lying in bed awake, appearing to be talking and breathing much more easily than yesterday.  Pt. agreeable to completing AD today but [place] paged to ED just before leaving to locate necessary notary and witnesses; [facility] plans to follow up this PM if possible.

## 2020-03-23 NOTE — Procedures (Signed)
Interventional Radiology Procedure Note  Procedure: US guided right thoracentesis. ~675cc of thin yellow fluid .  Complications: None  Recommendations:  - CXR pending - Do not submerge for 7 days - Routine care   Signed,  Dulcy Fanny. Earleen Newport, DO

## 2020-03-23 NOTE — Progress Notes (Signed)
PROGRESS NOTE    Joanne [name]  CVE:[id number] DOB: [birth year] DOA: 03/15/2020 PCP: Tracie Harrier, MD   Brief Narrative:  HPI: Joanne [name] is a 86 y.o. female with medical history significant of hypertension, hyperlipidemia, GERD, depression, Fuch's endothelial dystrophy, Crohn's disease, anemia, Zenker's diverticulum, who presents with pain, erythema and swelling in the right arm and the legs.  Per her daughter, pt has leg swelling, tenderness, weeping and erythema in both legs. The right leg is worse than the left which has been going on for several days.  Since yesterday, her right arm also becomes swollen, tender, weeping and erythematous.  Patient does not have fever or chills.  Patient has complaints of anal pain, but no nausea, vomiting, diarrhea or abdominal pain.  Denies chest pain or shortness breath. The patient has mild cough.  No symptoms of UTI.  Per report patient had urinary retention.   7/28: Patient seen and examined.  Daughter at bedside.  Discussed that patient still on 2 L supplemental oxygen.  CT scan did not reveal any pulmonary embolism but did show moderate to large pleural effusions bilaterally.  I explained that we could either wait and monitor to see if she can titrate off supplemental oxygen or attempt ultrasound-guided thoracentesis.  Risks and benefit of procedure explained.  Patient agreed to paracentesis.  This was performed successfully.  675 cc fluid removed   Assessment & Plan:   Principal Problem:   Right arm cellulitis Active Problems:   Shortness of breath   GERD without esophagitis   Sepsis ([place])   Crohn disease (HCC)   Depression   HTN (hypertension)   Proctitis   Cellulitis of lower extremity   Generalized weakness   Urinary retention   Hemorrhoids   Anal fissure   Wheeze   Atrial fibrillation with RVR (HCC)   AF (paroxysmal atrial fibrillation) (HCC)   Acute on chronic systolic CHF (congestive heart failure) (HCC)  Acute hypoxic  respiratory failure CT scan revealed a moderate to large bilateral pleural effusions No pulmonary embolism This is likely the cause of the patient's hypoxia Plan: Ultrasound-guided thoracentesis Attempt to wean from supplemental oxygen Encourage I-S use and ambulation  Right arm cellulitis Sepsis secondary to above, resolved Acute metabolic encephalopathy, per secondary to above, resolved Patient completed antibiotic course in house Stable, not acutely infected  Paroxysmal atrial fibrillation Cardizem and metoprolol for rate control Eliquis for anticoagulation  Dysphagia Viscous Lidocaine and Cepacol throat lozenges  Crohn's disease Budesonide  Hemorrhoids, anal fissure, stercoral colitis On steroid cream No active issues  Weakness Weaned off of authorization ended 03/22/2020 As patient still had inpatient management will need to restart authorization Still plan on dispo to rehab  Acute on chronic systolic congestive heart failure Continue torsemide and metoprolol Defer ACE inhibitor for now, can likely start as outpatient   DVT prophylaxis: Eliquis Code Status: Full Family Communication: Daughter at bedside Disposition Plan:Status is: Inpatient  Remains inpatient appropriate because:Inpatient level of care appropriate due to severity of illness   Dispo: The patient is from: Home              Anticipated d/c is to: SNF              Anticipated d/c date is: 1 day              Patient currently is not medically stable to d/c.         Consultants:   IR  Procedures:   Thoracentesis  Antimicrobials:   None    Subjective: Seen and examined.  Complains of weakness and shortness of breath  Objective: Vitals:   03/23/20 1236 03/23/20 1410 03/23/20 1433 03/23/20 1626  BP: (!) 114/58  102/76 (!) 116/58  Pulse: 67  (!) 149 90  Resp: 20  18 14   Temp: 98.3 F (36.8 C)   98.5 F (36.9 C)  TempSrc: Oral   Oral  SpO2: 100%  99% 100%  Weight:  79.4  kg    Height:        Intake/Output Summary (Last 24 hours) at 03/23/2020 1715 Last data filed at 03/23/2020 1600 Gross per 24 hour  Intake 290 ml  Output 1100 ml  Net -810 ml   Filed Weights   03/15/20 0853 03/23/20 1410  Weight: 78 kg 79.4 kg    Examination:  General exam: Appears calm and comfortable  Respiratory system: 2 L nasal cannula.  Normal work of breathing.  Sounds decreased at bases bilaterally Cardiovascular system: Regular rate, irregular rhythm, no appreciable murmurs, trace pedal edema bilaterally Gastrointestinal system: Obese, nontender, nondistended, normal bowel sounds Central nervous system: Alert and oriented. No focal neurological deficits. Extremities: Symmetric 5 x 5 power. Skin: No rashes, lesions or ulcers Psychiatry: Judgement and insight appear normal. Mood & affect appropriate.     Data Reviewed: I have personally reviewed following labs and imaging studies  CBC: Recent Labs  Lab 03/17/20 0819 03/19/20 0553 03/21/20 0552 03/22/20 0545 03/23/20 0438  WBC 13.2* 9.7 13.4* 14.9* 11.4*  HGB 9.9* 10.1* 10.1* 11.0* 10.3*  HCT 28.8* 31.3* 31.0* 32.9* 33.3*  MCV 82.5 86.9 86.1 85.5 89.5  PLT 441* 517* 614* 616* 818*   Basic Metabolic Panel: Recent Labs  Lab 03/18/20 0437 03/18/20 0437 03/19/20 0553 03/20/20 0609 03/21/20 0552 03/22/20 0545 03/23/20 0438  NA 137  --  136  --  137 135 133*  K 2.5*  --  3.9  --  4.2 4.2 3.9  CL 94*  --  98  --  98 96* 91*  CO2 30  --  28  --  30 31 31   GLUCOSE 96  --  112*  --  124* 123* 139*  BUN 15  --  10  --  11 13 14   CREATININE 0.73   < > 0.62 0.64 0.65 0.67 0.84  CALCIUM 7.9*  --  8.1*  --  8.1* 8.4* 8.1*  MG 2.1  --   --   --  1.6*  --  1.4*   < > = values in this interval not displayed.   GFR: Estimated Creatinine Clearance: 44.9 mL/min (by C-G formula based on SCr of 0.84 mg/dL). Liver Function Tests: No results for input(s): AST, ALT, ALKPHOS, BILITOT, PROT, ALBUMIN in the last 168  hours. No results for input(s): LIPASE, AMYLASE in the last 168 hours. No results for input(s): AMMONIA in the last 168 hours. Coagulation Profile: No results for input(s): INR, PROTIME in the last 168 hours. Cardiac Enzymes: No results for input(s): CKTOTAL, CKMB, CKMBINDEX, TROPONINI in the last 168 hours. BNP (last 3 results) No results for input(s): PROBNP in the last 8760 hours. HbA1C: No results for input(s): HGBA1C in the last 72 hours. CBG: No results for input(s): GLUCAP in the last 168 hours. Lipid Profile: No results for input(s): CHOL, HDL, LDLCALC, TRIG, CHOLHDL, LDLDIRECT in the last 72 hours. Thyroid Function Tests: No results for input(s): TSH, T4TOTAL, FREET4, T3FREE, THYROIDAB in the last 72 hours. Anemia Panel: No  results for input(s): VITAMINB12, FOLATE, FERRITIN, TIBC, IRON, RETICCTPCT in the last 72 hours. Sepsis Labs: No results for input(s): PROCALCITON, LATICACIDVEN in the last 168 hours.  Recent Results (from the past 240 hour(s))  SARS Coronavirus 2 by RT PCR (hospital order, performed in [facility] hospital lab) Nasopharyngeal Nasopharyngeal Swab     Status: None   Collection Time: 03/15/20  9:41 AM   Specimen: Nasopharyngeal Swab  Result Value Ref Range Status   SARS Coronavirus 2 NEGATIVE NEGATIVE Final    Comment: (NOTE) SARS-CoV-2 target nucleic acids are NOT DETECTED.  The SARS-CoV-2 RNA is generally detectable in upper and lower respiratory specimens during the acute phase of infection. The lowest concentration of SARS-CoV-2 viral copies this assay can detect is 250 copies / mL. A negative result does not preclude SARS-CoV-2 infection and should not be used as the sole basis for treatment or other patient management decisions.  A negative result may occur with improper specimen collection / handling, submission of specimen other than nasopharyngeal swab, presence of viral mutation(s) within the areas targeted by this assay, and inadequate  number of viral copies (<250 copies / mL). A negative result must be combined with clinical observations, patient history, and epidemiological information.  Fact Sheet for Patients:   StrictlyIdeas.no  Fact Sheet for Healthcare Providers: BankingDealers.co.za  This test is not yet approved or  cleared by the [place] FDA and has been authorized for detection and/or diagnosis of SARS-CoV-2 by FDA under an Emergency Use Authorization (EUA).  This EUA will remain in effect (meaning this test can be used) for the duration of the COVID-19 declaration under Section 564(b)(1) of the Act, 21 U.S.C. section 360bbb-3(b)(1), unless the authorization is terminated or revoked sooner.  Performed at [facility], [address] [place], [place] [address]   Culture, blood (x 2)     Status: None   Collection Time: 03/15/20  9:41 AM   Specimen: BLOOD  Result Value Ref Range Status   Specimen Description BLOOD RIGHT ANTECUBITAL  Final   Special Requests   Final    BOTTLES DRAWN AEROBIC AND ANAEROBIC Blood Culture results may not be optimal due to an excessive volume of blood received in culture bottles   Culture   Final    NO GROWTH 5 DAYS Performed at [facility], [address] [place], [place] [address]    Report Status 03/20/2020 FINAL  Final  Culture, blood (x 2)     Status: None   Collection Time: 03/15/20  9:41 AM   Specimen: BLOOD  Result Value Ref Range Status   Specimen Description BLOOD LEFT ANTECUBITAL  Final   Special Requests   Final    BOTTLES DRAWN AEROBIC AND ANAEROBIC Blood Culture results may not be optimal due to an excessive volume of blood received in culture bottles   Culture   Final    NO GROWTH 5 DAYS Performed at [facility], [address] [place], [place] [address]    Report Status 03/20/2020 FINAL  Final  C Difficile Quick Screen w PCR reflex     Status: None   Collection  Time: 03/16/20 11:34 AM   Specimen: STOOL  Result Value Ref Range Status   C Diff antigen NEGATIVE NEGATIVE Final   C Diff toxin NEGATIVE NEGATIVE Final   C Diff interpretation No C. difficile detected.  Final    Comment: Performed at [facility], [address] [place], [place] [address]  SARS Coronavirus  2 by RT PCR (hospital order, performed in [facility] hospital lab) Nasopharyngeal Nasopharyngeal Swab     Status: None   Collection Time: 03/22/20 11:58 AM   Specimen: Nasopharyngeal Swab  Result Value Ref Range Status   SARS Coronavirus 2 NEGATIVE NEGATIVE Final    Comment: (NOTE) SARS-CoV-2 target nucleic acids are NOT DETECTED.  The SARS-CoV-2 RNA is generally detectable in upper and lower respiratory specimens during the acute phase of infection. The lowest concentration of SARS-CoV-2 viral copies this assay can detect is 250 copies / mL. A negative result does not preclude SARS-CoV-2 infection and should not be used as the sole basis for treatment or other patient management decisions.  A negative result may occur with improper specimen collection / handling, submission of specimen other than nasopharyngeal swab, presence of viral mutation(s) within the areas targeted by this assay, and inadequate number of viral copies (<250 copies / mL). A negative result must be combined with clinical observations, patient history, and epidemiological information.  Fact Sheet for Patients:   StrictlyIdeas.no  Fact Sheet for Healthcare Providers: BankingDealers.co.za  This test is not yet approved or  cleared by the [place] FDA and has been authorized for detection and/or diagnosis of SARS-CoV-2 by FDA under an Emergency Use Authorization (EUA).  This EUA will remain in effect (meaning this test can be used) for the duration of the COVID-19 declaration under Section 564(b)(1) of the Act, 21 U.S.C. section 360bbb-3(b)(1),  unless the authorization is terminated or revoked sooner.  Performed at [facility], [address] [place], [place] [address]          Radiology Studies: CT ANGIO CHEST PE W OR WO CONTRAST  Result Date: 03/22/2020 CLINICAL DATA:  86 year old female with shortness of breath. Concern for pulmonary embolism. EXAM: CT ANGIOGRAPHY CHEST WITH CONTRAST TECHNIQUE: Multidetector CT imaging of the chest was performed using the standard protocol during bolus administration of intravenous contrast. Multiplanar CT image reconstructions and MIPs were obtained to evaluate the vascular anatomy. CONTRAST:  29m OMNIPAQUE IOHEXOL 350 MG/ML SOLN COMPARISON:  Chest radiograph dated 03/22/2020. FINDINGS: Cardiovascular: There is moderate cardiomegaly. No pericardial effusion. There is coronary vascular calcification. There is moderate atherosclerotic calcification of the thoracic aorta. Evaluation of the pulmonary arteries is limited due to respiratory motion artifact and suboptimal visualization of the peripheral branches. No pulmonary artery embolus identified. Mediastinum/Nodes: No hilar or mediastinal adenopathy. Evaluation however is limited due to consolidative changes of the adjacent lungs. The esophagus is grossly unremarkable. No mediastinal fluid collection. Lungs/Pleura: Moderate left and large right pleural effusions. There are consolidative changes of the majority of the lower lobes which may represent atelectasis or infiltrate. Underlying mass is not excluded. Clinical correlation and follow-up to resolution recommended. Linear left upper lobe atelectasis/scarring noted. Small bilateral upper lobe clusters of ground-glass density may represent edema or atypical pneumonia. There is no pneumothorax. The central airways are patent. Upper Abdomen: Probable mild fatty liver. Musculoskeletal: Osteopenia with degenerative changes of the spine. Age indeterminate, likely old T9 compression fracture with  near complete loss vertebral body height centrally. Review of the MIP images confirms the above findings. IMPRESSION: 1. No CT evidence of pulmonary embolism. 2. Moderate left and large right pleural effusions with consolidative changes of the majority of the lower lobes which may represent atelectasis or infiltrate. Underlying mass is not excluded. Clinical correlation and follow-up to resolution recommended. 3. Small bilateral upper lobe clusters of ground-glass density may represent edema or atypical pneumonia. 4. Moderate  cardiomegaly with coronary vascular calcification. 5. Age indeterminate, likely old T9 compression fracture. 6. Aortic Atherosclerosis (ICD10-I70.0). Electronically Signed   By: Anner Crete M.D.   On: 03/22/2020 15:43   DG Chest Port 1 View  Result Date: 03/23/2020 CLINICAL DATA:  86 year old female status post right-sided thoracentesis. EXAM: PORTABLE CHEST 1 VIEW COMPARISON:  Chest radiograph dated 03/22/2020. FINDINGS: There is shallow inspiration with bibasilar atelectasis. Left mid to lower lung field densities may represent atelectasis/infiltrate or edema. Small bilateral pleural effusions. No pneumothorax. There is cardiomegaly. Atherosclerotic calcification of the aorta. No acute osseous pathology. IMPRESSION: 1. No pneumothorax. 2. Small bilateral pleural effusions and left mid to lower lung field atelectasis/infiltrate versus edema. Electronically Signed   By: Anner Crete M.D.   On: 03/23/2020 15:18   DG Chest Port 1 View  Result Date: 03/22/2020 CLINICAL DATA:  Shortness of breath EXAM: PORTABLE CHEST 1 VIEW COMPARISON:  03/20/2020 FINDINGS: No significant change in AP portable examination with cardiomegaly, mild diffuse interstitial pulmonary opacity, and layering bilateral pleural effusions with associated atelectasis or consolidation. No new airspace opacity. IMPRESSION: No significant change in AP portable examination with cardiomegaly, mild diffuse interstitial  pulmonary opacity, and layering bilateral pleural effusions with associated atelectasis or consolidation. No new airspace opacity. Electronically Signed   By: Eddie Candle M.D.   On: 03/22/2020 12:44   US THORACENTESIS ASP PLEURAL SPACE W/IMG GUIDE  Result Date: 03/23/2020 INDICATION: 86 year old female with a history pleural effusion EXAM: ULTRASOUND GUIDED RIGHT THORACENTESIS MEDICATIONS: None. COMPLICATIONS: None PROCEDURE: An ultrasound guided thoracentesis was thoroughly discussed with the patient and questions answered. The benefits, risks, alternatives and complications were also discussed. The patient understands and wishes to proceed with the procedure. Written consent was obtained. Ultrasound was performed to localize and mark an adequate pocket of fluid in the right chest. The area was then prepped and draped in the normal sterile fashion. 1% Lidocaine was used for local anesthesia. Under ultrasound guidance a 8 Fr Safe-T-Centesis catheter was introduced. Thoracentesis was performed. The catheter was removed and a dressing applied. FINDINGS: A total of approximately 675 cc of thin yellow fluid was removed. Samples were sent to the laboratory as requested by the clinical team. IMPRESSION: Status post right-sided ultrasound-guided thoracentesis. Signed, Dulcy Fanny. Dellia Nims, RPVI [facility] Radiology Electronically Signed   By: Corrie Mckusick D.O.   On: 03/23/2020 15:10        Scheduled Meds:  apixaban  5 mg Oral BID   ascorbic acid  500 mg Oral Daily   budesonide  9 mg Oral Daily   budesonide (PULMICORT) nebulizer solution  0.5 mg Nebulization BID   calcium-vitamin D  1 tablet Oral TID   diltiazem  120 mg Oral Daily   DULoxetine  20 mg Oral Daily   furosemide  40 mg Intravenous BID   gabapentin  300 mg Oral TID   hydrocortisone   Topical TID   hydrocortisone  25 mg Rectal BID   hydrocortisone cream  1 application Topical TID    ipratropium-albuterol  3 mL Nebulization TID   lidocaine  1 patch Transdermal Q24H   liver oil-zinc oxide   Topical BID   magnesium oxide  400 mg Oral Daily   menthol-cetylpyridinium  1 lozenge Oral Q4H   metoprolol tartrate  25 mg Oral BID   multivitamin with minerals   Oral Daily   pantoprazole  40 mg Oral Daily   potassium chloride  40 mEq Oral BID  prednisoLONE acetate  1 drop Both Eyes QHS   zinc sulfate  220 mg Oral Daily   Continuous Infusions:  sodium chloride Stopped (03/21/20 1543)   sodium chloride       LOS: 8 days    Time spent: 25 minutes    Sidney Ace, MD [facility] Pager [phone]  If 7PM-7AM, please contact night-coverage 03/23/2020, 5:15 PM

## 2020-03-23 NOTE — Progress Notes (Signed)
Joanne [name] visited pt. in follow-up to visit this afternoon; Joanne [name] unable to coordinate signing of AD this PM due to scheduling conflicts w/available notaries --> will make referral to organize signing tomorrow.  Pt. lying in bed when Joanne [name] arrived w/dtr. Joanne [name] at bedside.  Dtr. shared pt. needs change of linens/bed pad and has not received response yet after pressing call bell.  RT arrived for breathing treatment and agreed to go contact RN for bedding change for pt.  Joanne [name] explained he will make referral to hopefully get AD notarized tomorrow; Joanne [name] assessed this is not ideal time for visit as pt. awaits RN arrival.  Joanne [name] remains available as needed.

## 2020-03-24 DIAGNOSIS — L03113 Cellulitis of right upper limb: Secondary | ICD-10-CM | POA: Diagnosis not present

## 2020-03-24 LAB — PATHOLOGIST SMEAR REVIEW

## 2020-03-24 LAB — GLUCOSE, CAPILLARY
Glucose-Capillary: 110 mg/dL — ABNORMAL HIGH (ref 70–99)
Glucose-Capillary: 132 mg/dL — ABNORMAL HIGH (ref 70–99)

## 2020-03-24 NOTE — Progress Notes (Signed)
Pt DC'ed via EMS to [facility],  daughter received documents.

## 2020-03-24 NOTE — Discharge Summary (Signed)
Physician Discharge Summary  Joanne [name] DTO:[id number] DOB: [birth year] DOA: 03/15/2020  PCP: Joanne Harrier, MD  Admit date: 03/15/2020 Discharge date: 03/24/2020  Admitted From: Home  Disposition:  SNF  Recommendations for Outpatient Follow-up:  1. Follow up with PCP in 1-2 weeks 2.   Home Health:No Equipment/Devices:Oxygen 2L Discharge Condition:Stable CODE STATUS:Full Diet recommendation: Heart Healthy / Carb Modified  Brief/Interim Summary: XIP:JASNK D [name] medical history significant ofhypertension, hyperlipidemia, GERD, depression,Fuch'sendothelial dystrophy, Crohn's disease, anemia, Zenker's diverticulum, who presents with pain, erythema and swelling in the right arm and the legs.  Per her daughter, pt has leg swelling,tenderness, weeping and erythema in both legs. Theright leg is worse than the left which has been going on for several days. Since yesterday, her right arm also becomes swollen, tender, weeping and erythematous.Patient does not have fever or chills. Patient has complaints of anal pain, but no nausea, vomiting, diarrhea or abdominal pain. Denies chest pain or shortness breath. The patient has mild cough. No symptoms of UTI. Per report patient had urinary retention.   7/28: Patient seen and examined.  Daughter at bedside.  Discussed that patient still on 2 L supplemental oxygen.  CT scan did not reveal any pulmonary embolism but did show moderate to large pleural effusions bilaterally.  I explained that we could either wait and monitor to see if she can titrate off supplemental oxygen or attempt ultrasound-guided thoracentesis.  Risks and benefit of procedure explained.  Patient agreed to paracentesis.  This was performed successfully.  675 cc fluid removed  7/29: Patient seen and examined.  Status post thoracentesis.  Patient reports that her shortness of breath is improving however she remains dependent on 2 L nasal cannula.   Suspect this is atelectasis and will resolve with continued rehabilitation, incentive spirometry use, ambulation, exertion.  Long discussion with the patient and the daughter at bedside.  Explained that her recovery is based on her willingness and ability to rehabilitate.  Patient expresses understanding.  She is stable for discharge to skilled nursing facility.  Discharge Diagnoses:  Principal Problem:   Right arm cellulitis Active Problems:   Shortness of breath   GERD without esophagitis   Sepsis ([place])   Crohn disease ([place])   Depression   HTN (hypertension)   Proctitis   Cellulitis of lower extremity   Generalized weakness   Urinary retention   Hemorrhoids   Anal fissure   Wheeze   Atrial fibrillation with RVR (H[facility])   AF (paroxysmal atrial fibrillation) (H[facility])   Acute on chronic systolic CHF (congestive heart failure) (HCC)  Acute hypoxic respiratory failure CT scan revealed a moderate to large bilateral pleural effusions No pulmonary embolism This is likely the cause of the patient's hypoxia Status post ultrasound-guided thoracentesis, 625 cc fluid removed Remains on 2 L nasal cannula at time of discharge Encourage I-S use, ambulation, physical exertion   Right arm cellulitis Sepsis secondary to above, resolved Acute metabolic encephalopathy, per secondary to above, resolved Patient completed antibiotic course in house Stable, not acutely infected No indication for antibiotics on discharge  Paroxysmal atrial fibrillation Cardizem and metoprolol for rate control Eliquis for anticoagulation  Dysphagia Viscous Lidocaine and Cepacol throat lozenges  Crohn's disease Budesonide  Hemorrhoids, anal fissure, stercoral colitis On steroid cream No active issues  Weakness Insurance company extended authorization to 03/24/2020 No need to restart authorization Stable for discharge to SNF  Acute on chronic systolic congestive heart failure Continue torsemide and  metoprolol Defer ACE inhibitor for now,  can likely start as outpatient  Discharge Instructions  Discharge Instructions    Diet - low sodium heart healthy   Complete by: As directed    Increase activity slowly   Complete by: As directed      Allergies as of 03/24/2020      Reactions   Oxycodone Other (See Comments)   "makes her crazy"      Medication List    STOP taking these medications   aspirin EC 81 MG tablet   chlorthalidone 25 MG tablet Commonly known as: HYGROTON   hydrocortisone 2.5 % rectal cream Commonly known as: ANUSOL-HC     TAKE these medications   acetaminophen 500 MG tablet Commonly known as: TYLENOL Take 500 mg by mouth every 8 (eight) hours as needed for mild pain or moderate pain.   apixaban 5 MG Tabs tablet Commonly known as: ELIQUIS Take 1 tablet (5 mg total) by mouth 2 (two) times daily.   ascorbic acid 500 MG tablet Commonly known as: VITAMIN C Take 500 mg by mouth daily.   budesonide 3 MG 24 hr capsule Commonly known as: ENTOCORT EC Take 9 mg by mouth daily.   calcium-vitamin D 500-200 MG-UNIT Tabs tablet Commonly known as: OSCAL WITH D Take 1 tablet by mouth 3 (three) times daily.   cefdinir 300 MG capsule Commonly known as: OMNICEF Take 1 capsule (300 mg total) by mouth 2 (two) times daily for 3 days.   cholecalciferol 25 MCG (1000 UNIT) tablet Commonly known as: VITAMIN D Take 3 tablets by mouth once daily   diltiazem 120 MG 24 hr capsule Commonly known as: CARDIZEM CD Take 1 capsule (120 mg total) by mouth daily.   doxycycline 100 MG tablet Commonly known as: VIBRA-TABS Take 1 tablet (100 mg total) by mouth every 12 (twelve) hours for 5 days.   DULoxetine 20 MG capsule Commonly known as: CYMBALTA Take 20 mg by mouth daily.   gabapentin 300 MG capsule Commonly known as: NEURONTIN Take 1 capsule (300 mg total) by mouth 3 (three) times daily. What changed:   how much to take  how to take this  when to take  this  additional instructions   hydrocortisone cream 1 % Apply 3 times a day to anal surface   lidocaine 2 % solution Commonly known as: XYLOCAINE Use as directed 15 mLs in the mouth or throat every 4 (four) hours as needed for mouth pain.   lidocaine 5 % Commonly known as: LIDODERM Apply topically to right knee in the AM and Remove & Discard patch at night within 12 hours or as directed by MD for uncontrolled knee pain   lisinopril 2.5 MG tablet Commonly known as: ZESTRIL Take 1 tablet (2.5 mg total) by mouth daily.   liver oil-zinc oxide 40 % ointment Commonly known as: DESITIN Apply topically 2 (two) times daily.   Loperamide A-D 2 MG tablet Generic drug: loperamide Take 2 mg by mouth 3 (three) times daily.   magnesium oxide 400 (241.3 Mg) MG tablet Commonly known as: MAG-OX Take 400 mg by mouth daily.   menthol-cetylpyridinium 3 MG lozenge Commonly known as: CEPACOL Take 1 lozenge (3 mg total) by mouth as needed for sore throat.   metoprolol tartrate 25 MG tablet Commonly known as: LOPRESSOR Take 1 tablet (25 mg total) by mouth 2 (two) times daily.   multivitamin tablet Take 1 tablet by mouth daily.   NexIUM 40 MG capsule Generic drug: esomeprazole Take 40 mg by mouth daily.  potassium chloride SA 20 MEQ tablet Commonly known as: KLOR-CON Take 20 mEq by mouth 2 (two) times daily.   prednisoLONE acetate 1 % ophthalmic suspension Commonly known as: PRED FORTE Place 1 drop into both eyes at bedtime.   torsemide 20 MG tablet Commonly known as: DEMADEX Take 1 tablet (20 mg total) by mouth daily.   traZODone 50 MG tablet Commonly known as: DESYREL Take 1 tablet (50 mg total) by mouth at bedtime as needed for sleep.   Vitamin B-12 5000 MCG Lozg Subl Place 1 tablet under the tongue once dailY   Zinc-220 220 (50 Zn) MG capsule Generic drug: zinc sulfate Take 220 mg by mouth daily.       Contact information for follow-up providers    Teodoro Spray,  MD. Go on 04/04/2020.   Specialty: Cardiology Why: 3:15pm appointment Contact information: [place] [address] [phone]        [place]. Go on 04/18/2020.   Specialty: Cardiology Why: 1:30pm appointment Contact information: [place] [phone]           Contact information for after-discharge care    Destination    [facility].   Service: Skilled Nursing Contact information: [address] [place] [phone]                 Allergies  Allergen Reactions  . Oxycodone Other (See Comments)    "makes her crazy"    Consultations:  Surgery   Procedures/Studies: CT Head Wo Contrast  Result Date: 03/15/2020 CLINICAL DATA:  Head trauma, headache. Additional history provided: Weakness and urinary retention for 4 days. EXAM: CT HEAD WITHOUT CONTRAST TECHNIQUE: Contiguous axial images were obtained from the base of the skull through the vertex without intravenous contrast. COMPARISON:  Prior head CT examination 02/23/2018 and earlier FINDINGS: Brain: Stable, mild-to-moderate generalized parenchymal atrophy. Stable, minimal ill-defined hypoattenuation within the cerebral white matter is nonspecific, but consistent with chronic small vessel ischemic disease. There is no acute intracranial hemorrhage. No demarcated cortical infarct. No extra-axial fluid collection. No evidence of intracranial mass. No midline shift. Redemonstrated nonspecific prominent left frontal dural calcification. Vascular: No hyperdense vessel.  Atherosclerotic calcifications Skull: Normal. Negative for fracture or focal lesion. Sinuses/Orbits: Left scleral buckle. Visualized orbits show no acute finding. Mild ethmoid, left sphenoid and right maxillary sinus mucosal thickening at the imaged levels. No significant mastoid  effusion. IMPRESSION: No CT evidence of acute intracranial abnormality. Stable, mild-to-moderate generalized parenchymal atrophy and mild chronic small vessel ischemic disease. Mild paranasal sinus mucosal thickening at the imaged levels. Electronically Signed   By: Kellie Simmering DO   On: 03/15/2020 10:39   CT ANGIO CHEST PE W OR WO CONTRAST  Result Date: 03/22/2020 CLINICAL DATA:  86 year old female with shortness of breath. Concern for pulmonary embolism. EXAM: CT ANGIOGRAPHY CHEST WITH CONTRAST TECHNIQUE: Multidetector CT imaging of the chest was performed using the standard protocol during bolus administration of intravenous contrast. Multiplanar CT image reconstructions and MIPs were obtained to evaluate the vascular anatomy. CONTRAST:  27m OMNIPAQUE IOHEXOL 350 MG/ML SOLN COMPARISON:  Chest radiograph dated 03/22/2020. FINDINGS: Cardiovascular: There is moderate cardiomegaly. No pericardial effusion. There is coronary vascular calcification. There is moderate atherosclerotic calcification of the thoracic aorta. Evaluation of the pulmonary arteries is limited due to respiratory motion artifact and suboptimal visualization of the peripheral branches. No pulmonary artery embolus  identified. Mediastinum/Nodes: No hilar or mediastinal adenopathy. Evaluation however is limited due to consolidative changes of the adjacent lungs. The esophagus is grossly unremarkable. No mediastinal fluid collection. Lungs/Pleura: Moderate left and large right pleural effusions. There are consolidative changes of the majority of the lower lobes which may represent atelectasis or infiltrate. Underlying mass is not excluded. Clinical correlation and follow-up to resolution recommended. Linear left upper lobe atelectasis/scarring noted. Small bilateral upper lobe clusters of ground-glass density may represent edema or atypical pneumonia. There is no pneumothorax. The central airways are patent. Upper Abdomen: Probable mild fatty  liver. Musculoskeletal: Osteopenia with degenerative changes of the spine. Age indeterminate, likely old T9 compression fracture with near complete loss vertebral body height centrally. Review of the MIP images confirms the above findings. IMPRESSION: 1. No CT evidence of pulmonary embolism. 2. Moderate left and large right pleural effusions with consolidative changes of the majority of the lower lobes which may represent atelectasis or infiltrate. Underlying mass is not excluded. Clinical correlation and follow-up to resolution recommended. 3. Small bilateral upper lobe clusters of ground-glass density may represent edema or atypical pneumonia. 4. Moderate cardiomegaly with coronary vascular calcification. 5. Age indeterminate, likely old T9 compression fracture. 6. Aortic Atherosclerosis (ICD10-I70.0). Electronically Signed   By: Anner Crete M.D.   On: 03/22/2020 15:43   CT ABDOMEN PELVIS W CONTRAST  Result Date: 03/15/2020 CLINICAL DATA:  Abdominal pain, perirectal abscess suspected with swelling. EXAM: CT ABDOMEN AND PELVIS WITH CONTRAST TECHNIQUE: Multidetector CT imaging of the abdomen and pelvis was performed using the standard protocol following bolus administration of intravenous contrast. CONTRAST:  179m OMNIPAQUE IOHEXOL 300 MG/ML  SOLN COMPARISON:  05/14/2018 FINDINGS: Lower chest: Small RIGHT-sided pleural effusion is new from the previous exam. Associated with mild basilar volume loss. Signs of RIGHT coronary intervention. Heart size mildly enlarged.  No pericardial fluid. Hepatobiliary: Hepatic steatosis. Portal vein is patent. Post cholecystectomy. No biliary duct dilation. Pancreas: Pancreatic atrophy. No peripancreatic inflammation or duct dilation. Spleen: Spleen normal size and contour. Adrenals/Urinary Tract: Adrenal glands are normal. Mild cortical scarring of the kidneys bilaterally without signs of hydronephrosis. Small low-density lesion in the upper pole the LEFT kidney likely a  small cyst Small filling defect in the RIGHT renal pelvis versus contrast mixing. There is very subtle asymmetry of enhancement in this area as well best seen on sagittal image 59 of series 509. Question of filling defects seen on image 24 of delayed phase imaging in the axial plane. This areas incompletely opacified with limited assessment. Urinary bladder is markedly distended. Distal ureter select distal ureter distal ureteral evaluation hampered by susceptibility artifact from the patient's hip arthroplasty changes in the bilateral hips. Stomach/Bowel: Marked distension of the rectum filling the pelvic outlet measuring approximately 8.2 x 8.1 cm. Perirectal stranding. Bowing of levator in I suggests pelvic floor dysfunction. Imaging does not extend entirely through the perineum. No abscess is visualized in the imaged portions of the abdomen and pelvis. Partially formed stool and liquid stool seen elsewhere in the colon. Mild distension of bowel loops in the RIGHT lower quadrant. Calcifications in the RIGHT lower quadrant mesentery with similar appearance to study from 2019. Distal ileal mural stratification and narrowing best noted on image 51 of series 504 alternating areas of dilation and narrowing seen proximal to this, pattern is similar to previous imaging. Stomach is under distended. Vascular/Lymphatic: Calcified and noncalcified plaque in the abdominal aorta without aneurysmal dilation. No adenopathy in the retroperitoneum or in the upper abdomen. No  pelvic lymphadenopathy. Reproductive: Nonspecific, unremarkable CT appearance of uterus and adnexa. Other: No ascites.  No abscess. Musculoskeletal: Cement augmentation of the L3 vertebral body. Compression fracture at T9 with a chronic appearance. IMPRESSION: 1. Finding of stercoral colitis/proctitis with perianal inflammation and descent of pelvic floor structures. Anal orifice and perianal regions are incompletely imaged. 2. Contrast mixing versus small  filling defect in the RIGHT renal pelvis. Follow-up with urine cytology and urologic evaluation on a nonemergent basis may be helpful with hematuria evaluation as warranted. 3. Chronic areas of small bowel narrowing and dilation in the setting of Crohn's disease. No signs of perienteric stranding. Mild acute on chronic changes are however considered given appearance. Areas of more proximal ileal distension are slightly increased but without frank evidence of obstruction. 4. Moderate to marked distension of the urinary bladder could be secondarily related to rectal distension but there is no current hydronephrosis. 5. Hepatic steatosis. 6. New small RIGHT effusion. 7. Aortic atherosclerosis. Aortic Atherosclerosis (ICD10-I70.0). Electronically Signed   By: Zetta Bills M.D.   On: 03/15/2020 11:08   US Venous Img Lower Bilateral (DVT)  Result Date: 03/16/2020 CLINICAL DATA:  86 year old female with a history of cellulitis EXAM: BILATERAL LOWER EXTREMITY VENOUS DOPPLER ULTRASOUND TECHNIQUE: Gray-scale sonography with graded compression, as well as color Doppler and duplex ultrasound were performed to evaluate the lower extremity deep venous systems from the level of the common femoral vein and including the common femoral, femoral, profunda femoral, popliteal and calf veins including the posterior tibial, peroneal and gastrocnemius veins when visible. The superficial great saphenous vein was also interrogated. Spectral Doppler was utilized to evaluate flow at rest and with distal augmentation maneuvers in the common femoral, femoral and popliteal veins. COMPARISON:  None. FINDINGS: RIGHT LOWER EXTREMITY Common Femoral Vein: No evidence of thrombus. Normal compressibility, respiratory phasicity and response to augmentation. Saphenofemoral Junction: No evidence of thrombus. Normal compressibility and flow on color Doppler imaging. Profunda Femoral Vein: No evidence of thrombus. Normal compressibility and flow on  color Doppler imaging. Femoral Vein: No evidence of thrombus. Normal compressibility, respiratory phasicity and response to augmentation. Popliteal Vein: No evidence of thrombus. Normal compressibility, respiratory phasicity and response to augmentation. Calf Veins: No evidence of thrombus. Normal compressibility and flow on color Doppler imaging. Superficial Great Saphenous Vein: No evidence of thrombus. Normal compressibility and flow on color Doppler imaging. Other Findings:  None. LEFT LOWER EXTREMITY Common Femoral Vein: No evidence of thrombus. Normal compressibility, respiratory phasicity and response to augmentation. Saphenofemoral Junction: No evidence of thrombus. Normal compressibility and flow on color Doppler imaging. Profunda Femoral Vein: No evidence of thrombus. Normal compressibility and flow on color Doppler imaging. Femoral Vein: No evidence of thrombus. Normal compressibility, respiratory phasicity and response to augmentation. Popliteal Vein: No evidence of thrombus. Normal compressibility, respiratory phasicity and response to augmentation. Calf Veins: No evidence of thrombus. Normal compressibility and flow on color Doppler imaging. Superficial Great Saphenous Vein: No evidence of thrombus. Normal compressibility and flow on color Doppler imaging. Other Findings:  None. IMPRESSION: Sonographic survey of the bilateral lower extremities negative for DVT Electronically Signed   By: Corrie Mckusick D.O.   On: 03/16/2020 11:25   US Venous Img Upper Uni Right(DVT)  Result Date: 03/16/2020 CLINICAL DATA:  86 year old female with a history of cellulitis EXAM: RIGHT UPPER EXTREMITY VENOUS DOPPLER ULTRASOUND TECHNIQUE: Gray-scale sonography with graded compression, as well as color Doppler and duplex ultrasound were performed to evaluate the upper extremity deep venous system from the level  of the subclavian vein and including the jugular, axillary, basilic, radial, ulnar and upper cephalic vein.  Spectral Doppler was utilized to evaluate flow at rest and with distal augmentation maneuvers. COMPARISON:  None. FINDINGS: Contralateral Subclavian Vein: Respiratory phasicity is normal and symmetric with the symptomatic side. No evidence of thrombus. Normal compressibility. Internal Jugular Vein: No evidence of thrombus. Normal compressibility, respiratory phasicity and response to augmentation. Subclavian Vein: No evidence of thrombus. Normal compressibility, respiratory phasicity and response to augmentation. Axillary Vein: No evidence of thrombus. Normal compressibility, respiratory phasicity and response to augmentation. Cephalic Vein: No evidence of thrombus. Normal compressibility, respiratory phasicity and response to augmentation. Basilic Vein: No evidence of thrombus. Normal compressibility, respiratory phasicity and response to augmentation. Brachial Veins: No evidence of thrombus. Normal compressibility, respiratory phasicity and response to augmentation. Radial Veins: No evidence of thrombus. Normal compressibility, respiratory phasicity and response to augmentation. Ulnar Veins: No evidence of thrombus. Normal compressibility, respiratory phasicity and response to augmentation. Other Findings:  Edema IMPRESSION: Sonographic survey of the right upper extremity negative for DVT Edema Electronically Signed   By: Corrie Mckusick D.O.   On: 03/16/2020 11:26   DG Chest Port 1 View  Result Date: 03/23/2020 CLINICAL DATA:  86 year old female status post right-sided thoracentesis. EXAM: PORTABLE CHEST 1 VIEW COMPARISON:  Chest radiograph dated 03/22/2020. FINDINGS: There is shallow inspiration with bibasilar atelectasis. Left mid to lower lung field densities may represent atelectasis/infiltrate or edema. Small bilateral pleural effusions. No pneumothorax. There is cardiomegaly. Atherosclerotic calcification of the aorta. No acute osseous pathology. IMPRESSION: 1. No pneumothorax. 2. Small bilateral pleural  effusions and left mid to lower lung field atelectasis/infiltrate versus edema. Electronically Signed   By: Anner Crete M.D.   On: 03/23/2020 15:18   DG Chest Port 1 View  Result Date: 03/22/2020 CLINICAL DATA:  Shortness of breath EXAM: PORTABLE CHEST 1 VIEW COMPARISON:  03/20/2020 FINDINGS: No significant change in AP portable examination with cardiomegaly, mild diffuse interstitial pulmonary opacity, and layering bilateral pleural effusions with associated atelectasis or consolidation. No new airspace opacity. IMPRESSION: No significant change in AP portable examination with cardiomegaly, mild diffuse interstitial pulmonary opacity, and layering bilateral pleural effusions with associated atelectasis or consolidation. No new airspace opacity. Electronically Signed   By: Eddie Candle M.D.   On: 03/22/2020 12:44   DG Chest Port 1 View  Result Date: 03/20/2020 CLINICAL DATA:  Cough and shortness of breath beginning yesterday. EXAM: PORTABLE CHEST 1 VIEW COMPARISON:  04/01/2017 FINDINGS: Lordotic technique is demonstrated. Lungs are hypoinflated with hazy opacification over the left base/retrocardiac region which may be due to effusion/atelectasis versus infection. Linear atelectasis over the left midlung. Mild prominence of the central perihilar markings. Mild stable cardiomegaly. Remainder of the exam is unchanged. IMPRESSION: 1. Left base opacification which may be due to atelectasis/effusion versus infection. Linear atelectasis left midlung. 2.  Stable cardiomegaly with possible minimal vascular congestion. Electronically Signed   By: Marin Olp M.D.   On: 03/20/2020 11:17   ECHOCARDIOGRAM COMPLETE  Result Date: 03/21/2020    ECHOCARDIOGRAM REPORT   Patient Name:   Joanne [name] Date of Exam: 03/20/2020 Medical Rec #:  [id number]     Height:       60.0 in Accession #:    [id number]    Weight:       172.0 lb Date of Birth:  [birth year]    BSA:          1.751 m Patient Age:    18  years      BP:            131/78 mmHg Patient Gender: F             HR:           91 bpm. Exam Location:  [facility] Procedure: 2D Echo, Cardiac Doppler and Color Doppler Indications:     Atrial Fibrillation 427.31 / I48.91  History:         Patient has no prior history of Echocardiogram examinations.                  Signs/Symptoms:Shortness of Breath; Risk Factors:Hypertension.  Sonographer:     Alyse Low Roar Referring Phys:  [id number] Loletha Grayer Diagnosing Phys: Bartholome Bill MD IMPRESSIONS  1. Left ventricular ejection fraction, by estimation, is 30 to 35%. The left ventricle has moderate to severely decreased function. The left ventricle demonstrates global hypokinesis. Left ventricular diastolic parameters were normal.  2. Right ventricular systolic function is normal. The right ventricular size is normal. There is normal pulmonary artery systolic pressure.  3. Moderate pleural effusion in the left lateral region.  4. The mitral valve was not well visualized. Mild to moderate mitral valve regurgitation.  5. The aortic valve was not well visualized. Aortic valve regurgitation is not visualized. FINDINGS  Left Ventricle: Left ventricular ejection fraction, by estimation, is 30 to 35%. The left ventricle has moderate to severely decreased function. The left ventricle demonstrates global hypokinesis. The left ventricular internal cavity size was normal in size. There is no left ventricular hypertrophy. Left ventricular diastolic parameters were normal. Right Ventricle: The right ventricular size is normal. No increase in right ventricular wall thickness. Right ventricular systolic function is normal. There is normal pulmonary artery systolic pressure. The tricuspid regurgitant velocity is 2.38 m/s, and  with an assumed right atrial pressure of 10 mmHg, the estimated right ventricular systolic pressure is 22.2 mmHg. Left Atrium: Left atrial size was normal in size. Right Atrium: Right atrial size was normal in size. Pericardium: There is  no evidence of pericardial effusion. Mitral Valve: The mitral valve was not well visualized. Mild to moderate mitral valve regurgitation. Tricuspid Valve: The tricuspid valve is not well visualized. Tricuspid valve regurgitation is mild. Aortic Valve: The aortic valve was not well visualized. Aortic valve regurgitation is not visualized. Aortic valve mean gradient measures 3.0 mmHg. Aortic valve peak gradient measures 6.4 mmHg. Aortic valve area, by VTI measures 2.19 cm. Pulmonic Valve: The pulmonic valve was not well visualized. Pulmonic valve regurgitation is trivial. Aorta: The aortic root was not well visualized. IAS/Shunts: The interatrial septum was not assessed. Additional Comments: There is a moderate pleural effusion in the left lateral region.  LEFT VENTRICLE PLAX 2D LVIDd:         4.82 cm      Diastology LVIDs:         4.16 cm      LV e' lateral:   4.35 cm/s LV PW:         1.02 cm      LV E/e' lateral: 28.0 LV IVS:        1.13 cm      LV e' medial:    3.81 cm/s LVOT diam:     2.10 cm      LV E/e' medial:  32.0 LV SV:         47 LV SV Index:   27 LVOT Area:     3.46 cm  LV  Volumes (MOD) LV vol d, MOD A2C: 143.0 ml LV vol d, MOD A4C: 123.0 ml LV vol s, MOD A2C: 99.3 ml LV vol s, MOD A4C: 86.1 ml LV SV MOD A2C:     43.7 ml LV SV MOD A4C:     123.0 ml LV SV MOD BP:      44.1 ml RIGHT VENTRICLE RV Mid diam:    3.04 cm RV S prime:     9.46 cm/s TAPSE (M-mode): 1.7 cm LEFT ATRIUM             Index       RIGHT ATRIUM           Index LA diam:        4.35 cm 2.48 cm/m  RA Area:     19.10 cm LA Vol (A2C):   70.8 ml 40.44 ml/m RA Volume:   44.50 ml  25.42 ml/m LA Vol (A4C):   77.5 ml 44.27 ml/m LA Biplane Vol: 74.8 ml 42.73 ml/m  AORTIC VALVE                   PULMONIC VALVE AV Area (Vmax):    2.12 cm    PV Vmax:        0.72 m/s AV Area (Vmean):   2.17 cm    PV Peak grad:   2.1 mmHg AV Area (VTI):     2.19 cm    RVOT Peak grad: 1 mmHg AV Vmax:           126.00 cm/s AV Vmean:          83.800 cm/s AV VTI:             0.215 m AV Peak Grad:      6.4 mmHg AV Mean Grad:      3.0 mmHg LVOT Vmax:         77.30 cm/s LVOT Vmean:        52.600 cm/s LVOT VTI:          0.136 m LVOT/AV VTI ratio: 0.63  AORTA Ao Root diam: 2.80 cm MITRAL VALVE                TRICUSPID VALVE MV Area (PHT): 8.43 cm     TR Peak grad:   22.7 mmHg MV Decel Time: 90 msec      TR Vmax:        238.00 cm/s MV E velocity: 122.00 cm/s MV A velocity: 118.00 cm/s  SHUNTS MV E/A ratio:  1.03         Systemic VTI:  0.14 m MV A Prime:    8.9 cm/s     Systemic Diam: 2.10 cm Bartholome Bill MD Electronically signed by Bartholome Bill MD Signature Date/Time: 03/21/2020/6:41:04 AM    Final    US THORACENTESIS ASP PLEURAL SPACE W/IMG GUIDE  Result Date: 03/23/2020 INDICATION: 86 year old female with a history pleural effusion EXAM: ULTRASOUND GUIDED RIGHT THORACENTESIS MEDICATIONS: None. COMPLICATIONS: None PROCEDURE: An ultrasound guided thoracentesis was thoroughly discussed with the patient and questions answered. The benefits, risks, alternatives and complications were also discussed. The patient understands and wishes to proceed with the procedure. Written consent was obtained. Ultrasound was performed to localize and mark an adequate pocket of fluid in the right chest. The area was then prepped and draped in the normal sterile fashion. 1% Lidocaine was used for local anesthesia. Under ultrasound guidance a 8 Fr Safe-T-Centesis catheter was introduced. Thoracentesis was performed. The catheter  was removed and a dressing applied. FINDINGS: A total of approximately 675 cc of thin yellow fluid was removed. Samples were sent to the laboratory as requested by the clinical team. IMPRESSION: Status post right-sided ultrasound-guided thoracentesis. Signed, Dulcy Fanny. Dellia Nims, RPVI [facility] Electronically Signed   By: Corrie Mckusick D.O.   On: 03/23/2020 15:10    (Echo, Carotid, EGD, Colonoscopy, ERCP)     Subjective: Patient seen and examined the day of discharge.  Daughter at bedside.  Stable for discharge to skilled nursing facility.  No complaints  Discharge Exam: Vitals:   03/24/20 1027 03/24/20 1125  BP: 118/81 103/69  Pulse: 105 89  Resp:  14  Temp:  99.2 F (37.3 C)  SpO2:  100%   Vitals:   03/23/20 2040 03/24/20 0431 03/24/20 1027 03/24/20 1125  BP: 119/83 106/66 118/81 103/69  Pulse: 64 48 105 89  Resp: (!) 24 16  14   Temp: 98.6 F (37 C) 98.2 F (36.8 C)  99.2 F (37.3 C)  TempSrc: Oral Oral  Oral  SpO2: 97% 100%  100%  Weight:      Height:        General: Pt is alert, awake, not in acute distress Cardiovascular: RRR, S1/S2 +, no rubs, no gallops Respiratory: Clear to auscultation bilaterally.  Lung sounds decreased at bases.  Normal work of breathing Abdominal: Soft, NT, ND, bowel sounds + Extremities: no edema, no cyanosis    The results of significant diagnostics from this hospitalization (including imaging, microbiology, ancillary and laboratory) are listed below for reference.     Microbiology: Recent Results (from the past 240 hour(s))  SARS Coronavirus 2 by RT PCR (hospital order, performed in [facility] hospital lab) Nasopharyngeal Nasopharyngeal Swab     Status: None   Collection Time: 03/15/20  9:41 AM   Specimen: Nasopharyngeal Swab  Result Value Ref Range Status   SARS Coronavirus 2 NEGATIVE NEGATIVE Final    Comment: (NOTE) SARS-CoV-2 target nucleic acids are NOT DETECTED.  The SARS-CoV-2 RNA is generally detectable in upper and lower respiratory specimens during the acute phase of infection. The lowest concentration of SARS-CoV-2 viral copies this assay can detect is 250 copies / mL. A negative result does not preclude SARS-CoV-2 infection and should not be used as the sole basis for treatment or other patient management decisions.  A negative result may occur with improper specimen collection / handling, submission of specimen  other than nasopharyngeal swab, presence of viral mutation(s) within the areas targeted by this assay, and inadequate number of viral copies (<250 copies / mL). A negative result must be combined with clinical observations, patient history, and epidemiological information.  Fact Sheet for Patients:   StrictlyIdeas.no  Fact Sheet for Healthcare Providers: BankingDealers.co.za  This test is not yet approved or  cleared by the [place] FDA and has been authorized for detection and/or diagnosis of SARS-CoV-2 by FDA under an Emergency Use Authorization (EUA).  This EUA will remain in effect (meaning this test can be used) for the duration of the COVID-19 declaration under Section 564(b)(1) of the Act, 21 U.S.C. section 360bbb-3(b)(1), unless the authorization is terminated or revoked sooner.  Performed at [facility], [address] [place], [place] [address]   Culture, blood (x 2)     Status: None   Collection Time: 03/15/20  9:41 AM   Specimen: BLOOD  Result Value Ref Range Status   Specimen Description BLOOD RIGHT ANTECUBITAL  Final   Special Requests   Final    BOTTLES DRAWN AEROBIC AND ANAEROBIC Blood Culture results may not be optimal due to an excessive volume of blood received in culture bottles   Culture   Final    NO GROWTH 5 DAYS Performed at [facility], [address] [place], [place] [address]    Report Status 03/20/2020 FINAL  Final  Culture, blood (x 2)     Status: None   Collection Time: 03/15/20  9:41 AM   Specimen: BLOOD  Result Value Ref Range Status   Specimen Description BLOOD LEFT ANTECUBITAL  Final   Special Requests   Final    BOTTLES DRAWN AEROBIC AND ANAEROBIC Blood Culture results may not be optimal due to an excessive volume of blood received in culture bottles   Culture   Final    NO GROWTH 5 DAYS Performed at [facility], [address] [place], [place]  [address]    Report Status 03/20/2020 FINAL  Final  C Difficile Quick Screen w PCR reflex     Status: None   Collection Time: 03/16/20 11:34 AM   Specimen: STOOL  Result Value Ref Range Status   C Diff antigen NEGATIVE NEGATIVE Final   C Diff toxin NEGATIVE NEGATIVE Final   C Diff interpretation No C. difficile detected.  Final    Comment: Performed at [facility], [address] [place], [place] [address]  SARS Coronavirus 2 by RT PCR (hospital order, performed in [facility] hospital lab) Nasopharyngeal Nasopharyngeal Swab     Status: None   Collection Time: 03/22/20 11:58 AM   Specimen: Nasopharyngeal Swab  Result Value Ref Range Status   SARS Coronavirus 2 NEGATIVE NEGATIVE Final    Comment: (NOTE) SARS-CoV-2 target nucleic acids are NOT DETECTED.  The SARS-CoV-2 RNA is generally detectable in upper and lower respiratory specimens during the acute phase of infection. The lowest concentration of SARS-CoV-2 viral copies this assay can detect is 250 copies / mL. A negative result does not preclude SARS-CoV-2 infection and should not be used as the sole basis for treatment or other patient management decisions.  A negative result may occur with improper specimen collection / handling, submission of specimen other than nasopharyngeal swab, presence of viral mutation(s) within the areas targeted by this assay, and inadequate number of viral copies (<250 copies / mL). A negative result must be combined with clinical observations, patient history, and epidemiological information.  Fact Sheet for Patients:   StrictlyIdeas.no  Fact Sheet for Healthcare Providers: BankingDealers.co.za  This test is not yet approved or  cleared by the [place] FDA and has been authorized for detection and/or diagnosis of SARS-CoV-2 by FDA under an Emergency Use Authorization (EUA).  This EUA will remain in effect (meaning this test can be  used) for the duration of the COVID-19 declaration under Section 564(b)(1) of the Act, 21 U.S.C. section 360bbb-3(b)(1), unless the authorization is terminated or revoked sooner.  Performed at [facility], [address] [place], [place] [address]   Body fluid culture     Status: None (Preliminary result)   Collection Time: 03/23/20  2:45 PM   Specimen: PATH Cytology Pleural fluid  Result Value Ref Range Status   Specimen Description   Final    PLEURAL Performed at [facility], [address], [place],  [address]    Special Requests   Final    NONE Performed at [facility], [address] [place], [place] [address]    Gram Stain   Final    FEW WBC PRESENT, PREDOMINANTLY MONONUCLEAR NO ORGANISMS SEEN    Culture   Final    NO GROWTH < 24 HOURS Performed at [facility] [address], [place], [place] [address]    Report Status PENDING  Incomplete     Labs: BNP (last 3 results) Recent Labs    03/15/20 0857  BNP 283.6*   Basic Metabolic Panel: Recent Labs  Lab 03/18/20 0437 03/18/20 0437 03/19/20 0553 03/20/20 0609 03/21/20 0552 03/22/20 0545 03/23/20 0438  NA 137  --  136  --  137 135 133*  K 2.5*  --  3.9  --  4.2 4.2 3.9  CL 94*  --  98  --  98 96* 91*  CO2 30  --  28  --  30 31 31   GLUCOSE 96  --  112*  --  124* 123* 139*  BUN 15  --  10  --  11 13 14   CREATININE 0.73   < > 0.62 0.64 0.65 0.67 0.84  CALCIUM 7.9*  --  8.1*  --  8.1* 8.4* 8.1*  MG 2.1  --   --   --  1.6*  --  1.4*   < > = values in this interval not displayed.   Liver Function Tests: No results for input(s): AST, ALT, ALKPHOS, BILITOT, PROT, ALBUMIN in the last 168 hours. No results for input(s): LIPASE, AMYLASE in the last 168 hours. No results for input(s): AMMONIA in the last 168 hours. CBC: Recent Labs  Lab 03/19/20 0553 03/21/20 0552 03/22/20 0545 03/23/20 0438  WBC 9.7 13.4* 14.9* 11.4*  HGB 10.1* 10.1* 11.0* 10.3*  HCT 31.3*  31.0* 32.9* 33.3*  MCV 86.9 86.1 85.5 89.5  PLT 517* 614* 616* 654*   Cardiac Enzymes: No results for input(s): CKTOTAL, CKMB, CKMBINDEX, TROPONINI in the last 168 hours. BNP: Invalid input(s): POCBNP CBG: Recent Labs  Lab 03/24/20 0753 03/24/20 1119  GLUCAP 110* 132*   D-Dimer No results for input(s): DDIMER in the last 72 hours. Hgb A1c No results for input(s): HGBA1C in the last 72 hours. Lipid Profile No results for input(s): CHOL, HDL, LDLCALC, TRIG, CHOLHDL, LDLDIRECT in the last 72 hours. Thyroid function studies No results for input(s): TSH, T4TOTAL, T3FREE, THYROIDAB in the last 72 hours.  Invalid input(s): FREET3 Anemia work up No results for input(s): VITAMINB12, FOLATE, FERRITIN, TIBC, IRON, RETICCTPCT in the last 72 hours. Urinalysis    Component Value Date/Time   COLORURINE AMBER (A) 03/15/2020 1024   APPEARANCEUR CLEAR (A) 03/15/2020 1024   APPEARANCEUR Clear 01/12/2013 1321   LABSPEC 1.025 03/15/2020 1024   LABSPEC 1.011 01/12/2013 1321   PHURINE 5.0 03/15/2020 1024   GLUCOSEU NEGATIVE 03/15/2020 1024   GLUCOSEU Negative 01/12/2013 1321   HGBUR NEGATIVE 03/15/2020 1024   BILIRUBINUR NEGATIVE 03/15/2020 1024   BILIRUBINUR Negative 01/12/2013 1321   KETONESUR NEGATIVE 03/15/2020 1024   PROTEINUR NEGATIVE 03/15/2020 1024   NITRITE POSITIVE (A) 03/15/2020 1024   LEUKOCYTESUR NEGATIVE 03/15/2020 1024   LEUKOCYTESUR Negative 01/12/2013 1321   Sepsis Labs Invalid input(s): PROCALCITONIN,  WBC,  LACTICIDVEN Microbiology Recent Results (from the past 240 hour(s))  SARS Coronavirus 2 by RT PCR (hospital order, performed in [facility] hospital lab) Nasopharyngeal Nasopharyngeal Swab     Status: None   Collection Time: 03/15/20  9:41 AM   Specimen: Nasopharyngeal Swab  Result Value Ref Range Status  SARS Coronavirus 2 NEGATIVE NEGATIVE Final    Comment: (NOTE) SARS-CoV-2 target nucleic acids are NOT DETECTED.  The SARS-CoV-2 RNA is generally detectable  in upper and lower respiratory specimens during the acute phase of infection. The lowest concentration of SARS-CoV-2 viral copies this assay can detect is 250 copies / mL. A negative result does not preclude SARS-CoV-2 infection and should not be used as the sole basis for treatment or other patient management decisions.  A negative result may occur with improper specimen collection / handling, submission of specimen other than nasopharyngeal swab, presence of viral mutation(s) within the areas targeted by this assay, and inadequate number of viral copies (<250 copies / mL). A negative result must be combined with clinical observations, patient history, and epidemiological information.  Fact Sheet for Patients:   StrictlyIdeas.no  Fact Sheet for Healthcare Providers: BankingDealers.co.za  This test is not yet approved or  cleared by the [place] FDA and has been authorized for detection and/or diagnosis of SARS-CoV-2 by FDA under an Emergency Use Authorization (EUA).  This EUA will remain in effect (meaning this test can be used) for the duration of the COVID-19 declaration under Section 564(b)(1) of the Act, 21 U.S.C. section 360bbb-3(b)(1), unless the authorization is terminated or revoked sooner.  Performed at [facility], [address] [place], [place] [address]   Culture, blood (x 2)     Status: None   Collection Time: 03/15/20  9:41 AM   Specimen: BLOOD  Result Value Ref Range Status   Specimen Description BLOOD RIGHT ANTECUBITAL  Final   Special Requests   Final    BOTTLES DRAWN AEROBIC AND ANAEROBIC Blood Culture results may not be optimal due to an excessive volume of blood received in culture bottles   Culture   Final    NO GROWTH 5 DAYS Performed at [facility], [address] [place], [place] [address]    Report Status 03/20/2020 FINAL  Final  Culture, blood (x 2)     Status: None    Collection Time: 03/15/20  9:41 AM   Specimen: BLOOD  Result Value Ref Range Status   Specimen Description BLOOD LEFT ANTECUBITAL  Final   Special Requests   Final    BOTTLES DRAWN AEROBIC AND ANAEROBIC Blood Culture results may not be optimal due to an excessive volume of blood received in culture bottles   Culture   Final    NO GROWTH 5 DAYS Performed at [facility], [address], [place], [place] [address]    Report Status 03/20/2020 FINAL  Final  C Difficile Quick Screen w PCR reflex     Status: None   Collection Time: 03/16/20 11:34 AM   Specimen: STOOL  Result Value Ref Range Status   C Diff antigen NEGATIVE NEGATIVE Final   C Diff toxin NEGATIVE NEGATIVE Final   C Diff interpretation No C. difficile detected.  Final    Comment: Performed at [facility], [address] [place], [place] [address]  SARS Coronavirus 2 by RT PCR (hospital order, performed in [facility] hospital lab) Nasopharyngeal Nasopharyngeal Swab     Status: None   Collection Time: 03/22/20 11:58 AM   Specimen: Nasopharyngeal Swab  Result Value Ref Range Status   SARS Coronavirus 2 NEGATIVE NEGATIVE Final    Comment: (NOTE) SARS-CoV-2 target nucleic acids are NOT DETECTED.  The SARS-CoV-2 RNA is generally detectable in upper and lower respiratory specimens during the acute phase of infection. The lowest  concentration of SARS-CoV-2 viral copies this assay can detect is 250 copies / mL. A negative result does not preclude SARS-CoV-2 infection and should not be used as the sole basis for treatment or other patient management decisions.  A negative result may occur with improper specimen collection / handling, submission of specimen other than nasopharyngeal swab, presence of viral mutation(s) within the areas targeted by this assay, and inadequate number of viral copies (<250 copies / mL). A negative result must be combined with clinical observations, patient history, and  epidemiological information.  Fact Sheet for Patients:   StrictlyIdeas.no  Fact Sheet for Healthcare Providers: BankingDealers.co.za  This test is not yet approved or  cleared by the [place] FDA and has been authorized for detection and/or diagnosis of SARS-CoV-2 by FDA under an Emergency Use Authorization (EUA).  This EUA will remain in effect (meaning this test can be used) for the duration of the COVID-19 declaration under Section 564(b)(1) of the Act, 21 U.S.C. section 360bbb-3(b)(1), unless the authorization is terminated or revoked sooner.  Performed at [facility], [address] [place], [place] [address]   Body fluid culture     Status: None (Preliminary result)   Collection Time: 03/23/20  2:45 PM   Specimen: PATH Cytology Pleural fluid  Result Value Ref Range Status   Specimen Description   Final    PLEURAL Performed at [facility], [address] [place], [place] [address]    Special Requests   Final    NONE Performed at [facility], [address] [place], [place] [address]    Gram Stain   Final    FEW WBC PRESENT, PREDOMINANTLY MONONUCLEAR NO ORGANISMS SEEN    Culture   Final    NO GROWTH < 24 HOURS Performed at [facility], [place] [address], [place], [place] [address]    Report Status PENDING  Incomplete     Time coordinating discharge: Over 30 minutes  SIGNED:   Sidney Ace, MD  [facility] 03/24/2020, 2:16 PM Pager   If 7PM-7AM, please contact night-coverage

## 2020-03-24 NOTE — TOC Transition Note (Signed)
Transition of Care [facility]) - CM/SW Discharge Note   Patient Details  Name: MAKENA MURDOCK MRN: [id number] Date of Birth: Nov 18, 1930  [facility] CM/SW Contact:  [name], RN Phone Number: 03/24/2020, 5:23 PM   Clinical Narrative:    Per MD patient medically ready for discharge today.  RNCM spoke with insurance they have extended her insurance authorization  Ricky at [employer]. DC info sent in the hub Both daughters updated  EMS packet printed  Bedside RN to call report and EMS    Final next level of care: Skilled Nursing Facility Barriers to Discharge: No Barriers Identified   Patient Goals and CMS Choice        Discharge Placement              Patient chooses bed at: [place]     Patient and family notified of of transfer: 03/22/20  Discharge Plan and Services   Discharge Planning Services: CM Consult                                 Social Determinants of Health (SDOH) Interventions     Readmission Risk Interventions No flowsheet data found.

## 2020-03-24 NOTE — Progress Notes (Signed)
Physical Therapy Treatment Patient Details Name: Joanne [name] MRN: [id number] DOB: 23-Apr-1931 Today's Date: 03/24/2020    History of Present Illness presented to ER secondary to pain, redness to R UE, bilat LEs; admitted for management of sepsis related to R UE, bilat LE cellulitis.    PT Comments    Able to complete OOB to chair this date, max assist via scoot pivot transfer over level surfaces. Max assist for forward trunk lean, lift off and lateral movement between seating surfaces.  Patient fatigues quickly, but pleased with progress.  Daughter at beside throughout session; very supportive/encouraging to patient.   Follow Up Recommendations  SNF     Equipment Recommendations       Recommendations for Other Services       Precautions / Restrictions Precautions Precautions: Fall Restrictions Weight Bearing Restrictions: No Other Position/Activity Restrictions: watch HR    Mobility  Bed Mobility Overal bed mobility: Needs Assistance Bed Mobility: Supine to Sit     Supine to sit: Mod assist;Max assist     General bed mobility comments: Deferred. Pt declines mobility, agreeable to bed-level ther-ex only.  Transfers Overall transfer level: Needs assistance   Transfers: Lateral/Scoot Transfers          Lateral/Scoot Transfers: Max assist;+2 safety/equipment General transfer comment: max assist for forward trunk lean, lift off and lateral movement  Ambulation/Gait             General Gait Details: unsafe/unable   Stairs             Wheelchair Mobility    Modified Rankin (Stroke Patients Only)       Balance Overall balance assessment: Needs assistance Sitting-balance support: No upper extremity supported;Feet supported Sitting balance-Leahy Scale: Fair Sitting balance - Comments: posterior lean with fatigue                                    Cognition Arousal/Alertness: Awake/alert Behavior During Therapy: WFL for tasks  assessed/performed Overall Cognitive Status: Within Functional Limits for tasks assessed                                 General Comments: Pt asleep upon OT arrival, she requires some cueing to open her eyes, but is able to participate t/o session. Pt dtr in room, states pt cognition is improved from admission. Pt able to answer questsions and follow VCs with min increased cueing.      Exercises General Exercises - Upper Extremity Shoulder Flexion: AROM;Strengthening;Both;10 reps Elbow Flexion: AROM;Strengthening;20 reps Elbow Extension: AROM;Strengthening;20 reps Low Level/ICU Exercises Shoulder Press: AROM;Strengthening;Both;10 reps Other Exercises Other Exercises: Unsupported sitting, min progressing to close sup-emphasis on postural extension/control.  Fatigues quickly; very limited functional reach outside immediate BOS Other Exercises: BUE ther-ex as described below. Pt requires significant increased time/cueing to perform. Fatigues quickly and benefits from therapeutic rest breaks.    General Comments        Pertinent Vitals/Pain Pain Assessment: Faces Faces Pain Scale: Hurts even more Pain Location: low back, buttocks/vaginal area Pain Descriptors / Indicators: Aching;Discomfort Pain Intervention(s): Limited activity within patient's tolerance;Monitored during session;Repositioned    Home Living                      Prior Function            PT Goals (current  goals can now be found in the care plan section) Acute Rehab PT Goals Patient Stated Goal: to get stronger PT Goal Formulation: With patient/family Time For Goal Achievement: 03/30/20 Potential to Achieve Goals: Good Progress towards PT goals: Progressing toward goals    Frequency    Min 2X/week      PT Plan Current plan remains appropriate    Co-evaluation              AM-PAC PT "6 Clicks" Mobility   Outcome Measure  Help needed turning from your back to your side  while in a flat bed without using bedrails?: A Lot Help needed moving from lying on your back to sitting on the side of a flat bed without using bedrails?: A Lot Help needed moving to and from a bed to a chair (including a wheelchair)?: A Lot Help needed standing up from a chair using your arms (e.g., wheelchair or bedside chair)?: Total Help needed to walk in hospital room?: Total Help needed climbing 3-5 steps with a railing? : Total 6 Click Score: 9    End of Session   Activity Tolerance: Patient tolerated treatment well Patient left: in chair;with call bell/phone within reach;with chair alarm set Nurse Communication: Mobility status PT Visit Diagnosis: Muscle weakness (generalized) (M62.81);Difficulty in walking, not elsewhere classified (R26.2);Pain Pain - Right/Left: Right Pain - part of body: Arm     Time: 7290-2111 PT Time Calculation (min) (ACUTE ONLY): 23 min  Charges:  $Therapeutic Activity: 23-37 mins                     Lamone Ferrelli H. Owens Shark, PT, DPT, NCS 03/24/20, 8:38 PM [phone]

## 2020-03-24 NOTE — Care Management Important Message (Signed)
Important Message  Patient Details  Name: Joanne [name] MRN: [id number] Date of Birth: 1930/12/11   Medicare Important Message Given:  Yes     [name] 03/24/2020, 1:35 PM

## 2020-03-24 NOTE — Progress Notes (Addendum)
   03/24/20 1015  Clinical Encounter Type  Visited With Patient and family together  Visit Type Follow-up  Referral From Chaplain  Consult/Referral To [facility] accompanied two witnesses and notary to patient's room. Patient signed AD and it was completed after witnesses and notary signed. Completed AD was copied, put in chart, and a copy will be scanned into [facility] system.  Chaplain will follow up with patient later.  While rounding alter in the day, chaplain stopped back by to check on patient and daughter said they were waiting for transportation because patient was going to a rehab. Chaplain was glad to hear that. Patient not happy with her progress, but her daughter tried to encourage her, explaining that she is making progress, although it is not as fast as she would like it to be. Chaplain asked what were some things she wanted to do and patient said anything. Daughter told chaplain that patient enjoys fishing.  Daughter talked about fishing and how her grandparents also enjoyed fishing. Before leaving chaplain had prayer with them and wished them well.

## 2020-03-24 NOTE — Progress Notes (Signed)
Occupational Therapy Treatment Patient Details Name: Joanne [name] MRN: [id number] DOB: 1931/07/29 Today's Date: 03/24/2020    History of present illness presented to ER secondary to pain, redness to R UE, bilat LEs; admitted for management of sepsis related to R UE, bilat LE cellulitis.   OT comments  Ms. Thalmann was seen for OT treatment on this date. Upon arrival to room pt sleeping soundly in bed with dtr present at bedside. Pt is slow to wake with VCs, but is agreeable to OT tx session. OT facilitates therapeutic exercises as described below. Pt able to perform 2 sets of 10 reps of each exercise with increased cueing, modeling, and therapeutic rest breaks t/o. Pt fatigues easily and requires significant prompting to perform exercises, however, she is able to participate in full session at performs 5 additional reps of elbow flexion/extension to conclude session. Pt making good progress toward goals and continues to benefit from skilled OT services to maximize return to PLOF and minimize risk of future falls, injury, caregiver burden, and readmission. Will continue to follow POC as written. Discharge recommendation remains appropriate.    Follow Up Recommendations  SNF    Equipment Recommendations       Recommendations for Other Services      Precautions / Restrictions Precautions Precautions: Fall Restrictions Weight Bearing Restrictions: No Other Position/Activity Restrictions: watch HR       Mobility Bed Mobility Overal bed mobility: Needs Assistance             General bed mobility comments: Deferred. Pt declines mobility, agreeable to bed-level ther-ex only.  Transfers Overall transfer level: Needs assistance               General transfer comment: deferred due to fatigue    Balance Overall balance assessment: Needs assistance                                         ADL either performed or assessed with clinical judgement   ADL Overall  ADL's : Needs assistance/impaired                                       General ADL Comments: Pt continues to require MAX A bed level ADL management as well as +2 assist for bed/functional mobility. BUE edema is much improved from past OT session, no weeping noted during OT tx and BUE swelling is notably decreased.     Vision Baseline Vision/History: Wears glasses Wears Glasses: Reading only Patient Visual Report: No change from baseline     Perception     Praxis      Cognition Arousal/Alertness: Lethargic Behavior During Therapy: WFL for tasks assessed/performed Overall Cognitive Status: Within Functional Limits for tasks assessed                                 General Comments: Pt asleep upon OT arrival, she requires some cueing to open her eyes, but is able to participate t/o session. Pt dtr in room, states pt cognition is improved from admission. Pt able to answer questsions and follow VCs with min increased cueing.        Exercises General Exercises - Upper Extremity Shoulder Flexion: AROM;Strengthening;Both;10 reps Elbow Flexion: AROM;Strengthening;20 reps Elbow Extension:  AROM;Strengthening;20 reps Low Level/ICU Exercises Shoulder Press: AROM;Strengthening;Both;10 reps Other Exercises Other Exercises: BUE ther-ex as described below. Pt requires significant increased time/cueing to perform. Fatigues quickly and benefits from therapeutic rest breaks.   Shoulder Instructions       General Comments      Pertinent Vitals/ Pain       Faces Pain Scale: Hurts a little bit Pain Location: low back Pain Descriptors / Indicators: Aching;Discomfort Pain Intervention(s): Limited activity within patient's tolerance;Monitored during session;Repositioned  Home Living                                          Prior Functioning/Environment              Frequency  Min 2X/week        Progress Toward Goals  OT  Goals(current goals can now be found in the care plan section)  Progress towards OT goals: Progressing toward goals  Acute Rehab OT Goals Patient Stated Goal: to get stronger OT Goal Formulation: With patient/family Time For Goal Achievement: 03/31/20 Potential to Achieve Goals: Good  Plan Discharge plan remains appropriate;Frequency remains appropriate    Co-evaluation                 AM-PAC OT "6 Clicks" Daily Activity     Outcome Measure   Help from another person eating meals?: A Little Help from another person taking care of personal grooming?: A Little Help from another person toileting, which includes using toliet, bedpan, or urinal?: A Lot Help from another person bathing (including washing, rinsing, drying)?: A Lot Help from another person to put on and taking off regular upper body clothing?: A Lot Help from another person to put on and taking off regular lower body clothing?: A Lot 6 Click Score: 14    End of Session    OT Visit Diagnosis: Other abnormalities of gait and mobility (R26.89);Repeated falls (R29.6);Muscle weakness (generalized) (M62.81);History of falling (Z91.81);Pain Pain - Right/Left:  (both) Pain - part of body:  (low back)   Activity Tolerance Patient limited by pain;Patient tolerated treatment well;Patient limited by fatigue   Patient Left in bed;with call bell/phone within reach;with bed alarm set;with family/visitor present   Nurse Communication          Time: 2751-7001 OT Time Calculation (min): 32 min  Charges: OT General Charges $OT Visit: 1 Visit OT Treatments $Therapeutic Exercise: 23-37 mins  Shara Blazing, M.S., OTR/L Ascom: [phone] 03/24/20, 4:59 PM

## 2020-03-27 LAB — BODY FLUID CULTURE: Culture: NO GROWTH

## 2020-04-18 ENCOUNTER — Ambulatory Visit: Payer: Medicare Other | Admitting: Family

## 2020-05-22 ENCOUNTER — Emergency Department: Payer: Medicare Other

## 2020-05-22 ENCOUNTER — Encounter: Payer: Self-pay | Admitting: Intensive Care

## 2020-05-22 ENCOUNTER — Inpatient Hospital Stay
Admission: EM | Admit: 2020-05-22 | Discharge: 2020-05-30 | DRG: 871 | Disposition: A | Discharge status: Hospice | Payer: Medicare Other | Attending: Hospitalist | Admitting: Hospitalist

## 2020-05-22 ENCOUNTER — Other Ambulatory Visit: Payer: Self-pay

## 2020-05-22 DIAGNOSIS — I5023 Acute on chronic systolic (congestive) heart failure: Secondary | ICD-10-CM | POA: Diagnosis present

## 2020-05-22 DIAGNOSIS — K219 Gastro-esophageal reflux disease without esophagitis: Secondary | ICD-10-CM | POA: Diagnosis present

## 2020-05-22 DIAGNOSIS — Z515 Encounter for palliative care: Secondary | ICD-10-CM | POA: Diagnosis not present

## 2020-05-22 DIAGNOSIS — Y95 Nosocomial condition: Secondary | ICD-10-CM | POA: Diagnosis present

## 2020-05-22 DIAGNOSIS — Z885 Allergy status to narcotic agent status: Secondary | ICD-10-CM

## 2020-05-22 DIAGNOSIS — R778 Other specified abnormalities of plasma proteins: Secondary | ICD-10-CM | POA: Diagnosis not present

## 2020-05-22 DIAGNOSIS — L03115 Cellulitis of right lower limb: Secondary | ICD-10-CM | POA: Diagnosis present

## 2020-05-22 DIAGNOSIS — Z9841 Cataract extraction status, right eye: Secondary | ICD-10-CM

## 2020-05-22 DIAGNOSIS — K5090 Crohn's disease, unspecified, without complications: Secondary | ICD-10-CM | POA: Diagnosis present

## 2020-05-22 DIAGNOSIS — Z66 Do not resuscitate: Secondary | ICD-10-CM | POA: Diagnosis present

## 2020-05-22 DIAGNOSIS — I088 Other rheumatic multiple valve diseases: Secondary | ICD-10-CM | POA: Diagnosis present

## 2020-05-22 DIAGNOSIS — I248 Other forms of acute ischemic heart disease: Secondary | ICD-10-CM | POA: Diagnosis present

## 2020-05-22 DIAGNOSIS — Z9049 Acquired absence of other specified parts of digestive tract: Secondary | ICD-10-CM

## 2020-05-22 DIAGNOSIS — J9622 Acute and chronic respiratory failure with hypercapnia: Secondary | ICD-10-CM | POA: Diagnosis not present

## 2020-05-22 DIAGNOSIS — F329 Major depressive disorder, single episode, unspecified: Secondary | ICD-10-CM | POA: Diagnosis present

## 2020-05-22 DIAGNOSIS — I509 Heart failure, unspecified: Secondary | ICD-10-CM | POA: Diagnosis not present

## 2020-05-22 DIAGNOSIS — M810 Age-related osteoporosis without current pathological fracture: Secondary | ICD-10-CM | POA: Diagnosis present

## 2020-05-22 DIAGNOSIS — Z87891 Personal history of nicotine dependence: Secondary | ICD-10-CM

## 2020-05-22 DIAGNOSIS — Z96642 Presence of left artificial hip joint: Secondary | ICD-10-CM | POA: Diagnosis present

## 2020-05-22 DIAGNOSIS — Z993 Dependence on wheelchair: Secondary | ICD-10-CM

## 2020-05-22 DIAGNOSIS — E876 Hypokalemia: Secondary | ICD-10-CM | POA: Diagnosis present

## 2020-05-22 DIAGNOSIS — J9621 Acute and chronic respiratory failure with hypoxia: Secondary | ICD-10-CM | POA: Diagnosis present

## 2020-05-22 DIAGNOSIS — Z7401 Bed confinement status: Secondary | ICD-10-CM

## 2020-05-22 DIAGNOSIS — R6521 Severe sepsis with septic shock: Secondary | ICD-10-CM | POA: Diagnosis present

## 2020-05-22 DIAGNOSIS — Z961 Presence of intraocular lens: Secondary | ICD-10-CM | POA: Diagnosis present

## 2020-05-22 DIAGNOSIS — J189 Pneumonia, unspecified organism: Secondary | ICD-10-CM | POA: Diagnosis present

## 2020-05-22 DIAGNOSIS — Z947 Corneal transplant status: Secondary | ICD-10-CM

## 2020-05-22 DIAGNOSIS — L03116 Cellulitis of left lower limb: Secondary | ICD-10-CM | POA: Diagnosis present

## 2020-05-22 DIAGNOSIS — F419 Anxiety disorder, unspecified: Secondary | ICD-10-CM | POA: Diagnosis present

## 2020-05-22 DIAGNOSIS — R32 Unspecified urinary incontinence: Secondary | ICD-10-CM | POA: Diagnosis present

## 2020-05-22 DIAGNOSIS — R0602 Shortness of breath: Secondary | ICD-10-CM

## 2020-05-22 DIAGNOSIS — I110 Hypertensive heart disease with heart failure: Secondary | ICD-10-CM | POA: Diagnosis present

## 2020-05-22 DIAGNOSIS — K225 Diverticulum of esophagus, acquired: Secondary | ICD-10-CM | POA: Diagnosis present

## 2020-05-22 DIAGNOSIS — R0902 Hypoxemia: Secondary | ICD-10-CM | POA: Diagnosis not present

## 2020-05-22 DIAGNOSIS — A419 Sepsis, unspecified organism: Principal | ICD-10-CM | POA: Diagnosis present

## 2020-05-22 DIAGNOSIS — Z7189 Other specified counseling: Secondary | ICD-10-CM | POA: Diagnosis not present

## 2020-05-22 DIAGNOSIS — B952 Enterococcus as the cause of diseases classified elsewhere: Secondary | ICD-10-CM | POA: Diagnosis present

## 2020-05-22 DIAGNOSIS — Z20822 Contact with and (suspected) exposure to covid-19: Secondary | ICD-10-CM | POA: Diagnosis present

## 2020-05-22 DIAGNOSIS — E785 Hyperlipidemia, unspecified: Secondary | ICD-10-CM | POA: Diagnosis present

## 2020-05-22 DIAGNOSIS — R6520 Severe sepsis without septic shock: Secondary | ICD-10-CM

## 2020-05-22 DIAGNOSIS — D649 Anemia, unspecified: Secondary | ICD-10-CM | POA: Diagnosis present

## 2020-05-22 DIAGNOSIS — I10 Essential (primary) hypertension: Secondary | ICD-10-CM

## 2020-05-22 DIAGNOSIS — Z87440 Personal history of urinary (tract) infections: Secondary | ICD-10-CM

## 2020-05-22 DIAGNOSIS — F32A Depression, unspecified: Secondary | ICD-10-CM | POA: Diagnosis present

## 2020-05-22 DIAGNOSIS — Z8249 Family history of ischemic heart disease and other diseases of the circulatory system: Secondary | ICD-10-CM

## 2020-05-22 DIAGNOSIS — I482 Chronic atrial fibrillation, unspecified: Secondary | ICD-10-CM | POA: Diagnosis present

## 2020-05-22 DIAGNOSIS — H9190 Unspecified hearing loss, unspecified ear: Secondary | ICD-10-CM | POA: Diagnosis present

## 2020-05-22 DIAGNOSIS — M1990 Unspecified osteoarthritis, unspecified site: Secondary | ICD-10-CM | POA: Diagnosis present

## 2020-05-22 DIAGNOSIS — I4891 Unspecified atrial fibrillation: Secondary | ICD-10-CM | POA: Diagnosis not present

## 2020-05-22 DIAGNOSIS — Z823 Family history of stroke: Secondary | ICD-10-CM

## 2020-05-22 DIAGNOSIS — Z7901 Long term (current) use of anticoagulants: Secondary | ICD-10-CM

## 2020-05-22 DIAGNOSIS — Z79899 Other long term (current) drug therapy: Secondary | ICD-10-CM

## 2020-05-22 HISTORY — DX: Unspecified atrial fibrillation: I48.91

## 2020-05-22 LAB — COMPREHENSIVE METABOLIC PANEL
ALT: 29 U/L (ref 0–44)
AST: 36 U/L (ref 15–41)
Albumin: 2.5 g/dL — ABNORMAL LOW (ref 3.5–5.0)
Alkaline Phosphatase: 102 U/L (ref 38–126)
Anion gap: 15 (ref 5–15)
BUN: 20 mg/dL (ref 8–23)
CO2: 29 mmol/L (ref 22–32)
Calcium: 8.4 mg/dL — ABNORMAL LOW (ref 8.9–10.3)
Chloride: 96 mmol/L — ABNORMAL LOW (ref 98–111)
Creatinine, Ser: 0.75 mg/dL (ref 0.44–1.00)
GFR calc Af Amer: >60 mL/min (ref >60)
GFR calc non Af Amer: >60 mL/min (ref >60)
Glucose, Bld: 185 mg/dL — ABNORMAL HIGH (ref 70–99)
Potassium: 3.5 mmol/L (ref 3.5–5.1)
Sodium: 140 mmol/L (ref 135–145)
Total Bilirubin: 0.6 mg/dL (ref 0.3–1.2)
Total Protein: 5.5 g/dL — ABNORMAL LOW (ref 6.5–8.1)

## 2020-05-22 LAB — CBC WITH DIFFERENTIAL/PLATELET
Abs Immature Granulocytes: 0.21 10*3/uL — ABNORMAL HIGH (ref 0.00–0.07)
Basophils Absolute: 0.1 10*3/uL (ref 0.0–0.1)
Basophils Relative: 0 %
Eosinophils Absolute: 0 10*3/uL (ref 0.0–0.5)
Eosinophils Relative: 0 %
HCT: 31.2 % — ABNORMAL LOW (ref 36.0–46.0)
Hemoglobin: 9.4 g/dL — ABNORMAL LOW (ref 12.0–15.0)
Immature Granulocytes: 1 %
Lymphocytes Relative: 5 %
Lymphs Abs: 1.3 10*3/uL (ref 0.7–4.0)
MCH: 27.7 pg (ref 26.0–34.0)
MCHC: 30.1 g/dL (ref 30.0–36.0)
MCV: 92 fL (ref 80.0–100.0)
Monocytes Absolute: 1.1 10*3/uL — ABNORMAL HIGH (ref 0.1–1.0)
Monocytes Relative: 5 %
Neutro Abs: 22.4 10*3/uL — ABNORMAL HIGH (ref 1.7–7.7)
Neutrophils Relative %: 89 %
Platelets: 354 10*3/uL (ref 150–400)
RBC: 3.39 MIL/uL — ABNORMAL LOW (ref 3.87–5.11)
RDW: 15.6 % — ABNORMAL HIGH (ref 11.5–15.5)
Smear Review: NORMAL
WBC: 25.1 10*3/uL — ABNORMAL HIGH (ref 4.0–10.5)
nRBC: 0 % (ref 0.0–0.2)

## 2020-05-22 LAB — PROTIME-INR
INR: 1.1 (ref 0.8–1.2)
Prothrombin Time: 14.2 seconds (ref 11.4–15.2)

## 2020-05-22 LAB — URINALYSIS, COMPLETE (UACMP) WITH MICROSCOPIC
Bilirubin Urine: NEGATIVE
Glucose, UA: NEGATIVE mg/dL
Hgb urine dipstick: NEGATIVE
Ketones, ur: NEGATIVE mg/dL
Nitrite: NEGATIVE
Protein, ur: 30 mg/dL — AB
Specific Gravity, Urine: 1.013 (ref 1.005–1.030)
pH: 5 (ref 5.0–8.0)

## 2020-05-22 LAB — LACTIC ACID, PLASMA
Lactic Acid, Venous: 4.3 mmol/L (ref 0.5–1.9)
Lactic Acid, Venous: 3.8 mmol/L (ref 0.5–1.9)

## 2020-05-22 LAB — TROPONIN I (HIGH SENSITIVITY)
Troponin I (High Sensitivity): 39 ng/L — ABNORMAL HIGH (ref <18)
Troponin I (High Sensitivity): 65 ng/L — ABNORMAL HIGH (ref <18)
Troponin I (High Sensitivity): 68 ng/L — ABNORMAL HIGH (ref <18)

## 2020-05-22 LAB — RESPIRATORY PANEL BY RT PCR (FLU A&B, COVID)
Influenza A by PCR: NEGATIVE
Influenza B by PCR: NEGATIVE
SARS Coronavirus 2 by RT PCR: NEGATIVE

## 2020-05-22 LAB — BRAIN NATRIURETIC PEPTIDE: B Natriuretic Peptide: 1598.8 pg/mL — ABNORMAL HIGH (ref 0.0–100.0)

## 2020-05-22 LAB — APTT: aPTT: 31 seconds (ref 24–36)

## 2020-05-22 LAB — PROCALCITONIN: Procalcitonin: 1.48 ng/mL

## 2020-05-22 MED ORDER — ALBUTEROL SULFATE (2.5 MG/3ML) 0.083% IN NEBU
2.5000 mg | INHALATION_SOLUTION | RESPIRATORY_TRACT | Status: DC | PRN
  Filled 2020-05-22: qty 3

## 2020-05-22 MED ORDER — ONDANSETRON HCL 4 MG/2ML IJ SOLN: 4.0000 mg | Freq: Three times a day (TID) | INTRAMUSCULAR | Status: DC | PRN

## 2020-05-22 MED ORDER — METOPROLOL TARTRATE 25 MG PO TABS
25.0000 mg | ORAL_TABLET | Freq: Every day | ORAL | Status: DC
  Administered 2020-05-23: 25 mg via ORAL
  Filled 2020-05-22: qty 1

## 2020-05-22 MED ORDER — APIXABAN 5 MG PO TABS
5.0000 mg | ORAL_TABLET | Freq: Two times a day (BID) | ORAL | Status: DC
  Administered 2020-05-23 – 2020-05-29 (×13): 5 mg via ORAL
  Filled 2020-05-22 (×13): qty 1

## 2020-05-22 MED ORDER — VANCOMYCIN HCL IN DEXTROSE 1-5 GM/200ML-% IV SOLN
1000.0000 mg | Freq: Once | INTRAVENOUS | Status: AC
  Administered 2020-05-22: 1000 mg via INTRAVENOUS
  Filled 2020-05-22: qty 200

## 2020-05-22 MED ORDER — ACETAMINOPHEN 325 MG PO TABS: 650.0000 mg | ORAL_TABLET | Freq: Four times a day (QID) | ORAL | Status: DC | PRN

## 2020-05-22 MED ORDER — ASCORBIC ACID 500 MG PO TABS
500.0000 mg | ORAL_TABLET | Freq: Every day | ORAL | Status: DC
  Administered 2020-05-23 – 2020-05-25 (×3): 500 mg via ORAL
  Filled 2020-05-22 (×4): qty 1

## 2020-05-22 MED ORDER — SODIUM CHLORIDE 0.9 % IV SOLN
2.0000 g | Freq: Two times a day (BID) | INTRAVENOUS | Status: DC
  Administered 2020-05-22 – 2020-05-26 (×8): 2 g via INTRAVENOUS
  Filled 2020-05-22 (×9): qty 2

## 2020-05-22 MED ORDER — SODIUM CHLORIDE 0.9 % IV SOLN
2.0000 g | Freq: Once | INTRAVENOUS | Status: AC
  Administered 2020-05-22: 2 g via INTRAVENOUS
  Filled 2020-05-22: qty 2

## 2020-05-22 MED ORDER — MAGNESIUM OXIDE 400 (241.3 MG) MG PO TABS
400.0000 mg | ORAL_TABLET | Freq: Two times a day (BID) | ORAL | Status: DC
  Administered 2020-05-23 – 2020-05-25 (×6): 400 mg via ORAL
  Filled 2020-05-22 (×7): qty 1

## 2020-05-22 MED ORDER — LOPERAMIDE HCL 2 MG PO CAPS
2.0000 mg | ORAL_CAPSULE | Freq: Two times a day (BID) | ORAL | Status: DC
  Administered 2020-05-23 – 2020-05-25 (×5): 2 mg via ORAL
  Filled 2020-05-22 (×6): qty 1

## 2020-05-22 MED ORDER — DILTIAZEM HCL ER COATED BEADS 120 MG PO CP24
120.0000 mg | ORAL_CAPSULE | Freq: Every day | ORAL | Status: DC
  Administered 2020-05-23 – 2020-05-27 (×5): 120 mg via ORAL
  Filled 2020-05-22 (×6): qty 1

## 2020-05-22 MED ORDER — DIGOXIN 0.25 MG/ML IJ SOLN
0.5000 mg | Freq: Once | INTRAMUSCULAR | Status: AC
  Administered 2020-05-22: 0.5 mg via INTRAVENOUS
  Filled 2020-05-22: qty 2

## 2020-05-22 MED ORDER — ZINC OXIDE 40 % EX OINT
TOPICAL_OINTMENT | Freq: Two times a day (BID) | CUTANEOUS | Status: DC
  Filled 2020-05-22 (×2): qty 113

## 2020-05-22 MED ORDER — DM-GUAIFENESIN ER 30-600 MG PO TB12
1.0000 | ORAL_TABLET | Freq: Two times a day (BID) | ORAL | Status: DC | PRN
  Administered 2020-05-24: 1 via ORAL
  Filled 2020-05-22: qty 1

## 2020-05-22 MED ORDER — SACCHAROMYCES BOULARDII 250 MG PO CAPS
250.0000 mg | ORAL_CAPSULE | Freq: Every day | ORAL | Status: DC
  Administered 2020-05-23 – 2020-05-25 (×3): 250 mg via ORAL
  Filled 2020-05-22 (×4): qty 1

## 2020-05-22 MED ORDER — PANTOPRAZOLE SODIUM 40 MG PO TBEC
40.0000 mg | DELAYED_RELEASE_TABLET | Freq: Every day | ORAL | Status: DC
  Administered 2020-05-23 – 2020-05-25 (×3): 40 mg via ORAL
  Filled 2020-05-22 (×4): qty 1

## 2020-05-22 MED ORDER — DILTIAZEM HCL 25 MG/5ML IV SOLN
10.0000 mg | Freq: Once | INTRAVENOUS | Status: AC
  Administered 2020-05-22: 10 mg via INTRAVENOUS
  Filled 2020-05-22: qty 5

## 2020-05-22 MED ORDER — VITAMIN D3 25 MCG (1000 UNIT) PO TABS
3000.0000 [IU] | ORAL_TABLET | Freq: Every day | ORAL | Status: DC
  Administered 2020-05-23 – 2020-05-25 (×3): 3000 [IU] via ORAL
  Filled 2020-05-22 (×8): qty 3

## 2020-05-22 MED ORDER — FERROUS SULFATE 325 (65 FE) MG PO TABS
325.0000 mg | ORAL_TABLET | Freq: Every day | ORAL | Status: DC
  Administered 2020-05-23 – 2020-05-25 (×3): 325 mg via ORAL
  Filled 2020-05-22 (×5): qty 1

## 2020-05-22 MED ORDER — ZINC SULFATE 220 (50 ZN) MG PO CAPS
220.0000 mg | ORAL_CAPSULE | Freq: Every day | ORAL | Status: DC
  Administered 2020-05-23 – 2020-05-25 (×3): 220 mg via ORAL
  Filled 2020-05-22 (×4): qty 1

## 2020-05-22 MED ORDER — DULOXETINE HCL 20 MG PO CPEP
20.0000 mg | ORAL_CAPSULE | Freq: Every day | ORAL | Status: DC
  Administered 2020-05-23 – 2020-05-25 (×3): 20 mg via ORAL
  Filled 2020-05-22 (×4): qty 1

## 2020-05-22 MED ORDER — COLESTIPOL HCL 1 G PO TABS
1.0000 g | ORAL_TABLET | Freq: Two times a day (BID) | ORAL | Status: DC
  Administered 2020-05-23 – 2020-05-25 (×6): 1 g via ORAL
  Filled 2020-05-22 (×9): qty 1

## 2020-05-22 MED ORDER — BUDESONIDE 3 MG PO CPEP
9.0000 mg | ORAL_CAPSULE | Freq: Every day | ORAL | Status: DC
  Administered 2020-05-23 – 2020-05-27 (×4): 9 mg via ORAL
  Filled 2020-05-22 (×8): qty 3

## 2020-05-22 MED ORDER — FUROSEMIDE 10 MG/ML IJ SOLN
20.0000 mg | Freq: Two times a day (BID) | INTRAMUSCULAR | Status: DC
  Administered 2020-05-22 – 2020-05-24 (×4): 20 mg via INTRAVENOUS
  Filled 2020-05-22 (×4): qty 4

## 2020-05-22 MED ORDER — ADULT MULTIVITAMIN W/MINERALS CH
1.0000 | ORAL_TABLET | Freq: Every day | ORAL | Status: DC
  Administered 2020-05-23 – 2020-05-25 (×3): 1 via ORAL
  Filled 2020-05-22 (×4): qty 1

## 2020-05-22 MED ORDER — CALCIUM CARBONATE-VITAMIN D 500-200 MG-UNIT PO TABS
1.0000 | ORAL_TABLET | Freq: Three times a day (TID) | ORAL | Status: DC
  Administered 2020-05-23 – 2020-05-25 (×8): 1 via ORAL
  Filled 2020-05-22 (×12): qty 1

## 2020-05-22 MED ORDER — SODIUM CHLORIDE 0.9 % IV BOLUS
500.0000 mL | Freq: Once | INTRAVENOUS | Status: AC
  Administered 2020-05-22: 500 mL via INTRAVENOUS

## 2020-05-22 MED ORDER — HYDROCORTISONE 1 % EX OINT
1.0000 "application " | TOPICAL_OINTMENT | Freq: Three times a day (TID) | CUTANEOUS | Status: DC
  Administered 2020-05-23 – 2020-05-25 (×4): 1 via TOPICAL
  Filled 2020-05-22 (×2): qty 28.35

## 2020-05-22 MED ORDER — GABAPENTIN 300 MG PO CAPS
300.0000 mg | ORAL_CAPSULE | Freq: Three times a day (TID) | ORAL | Status: DC
  Administered 2020-05-23 – 2020-05-25 (×8): 300 mg via ORAL
  Filled 2020-05-22 (×8): qty 1

## 2020-05-22 MED ORDER — IPRATROPIUM-ALBUTEROL 0.5-2.5 (3) MG/3ML IN SOLN
3.0000 mL | RESPIRATORY_TRACT | Status: DC
  Administered 2020-05-22 – 2020-05-24 (×10): 3 mL via RESPIRATORY_TRACT
  Filled 2020-05-22 (×11): qty 3

## 2020-05-22 MED ORDER — VANCOMYCIN HCL 750 MG/150ML IV SOLN
750.0000 mg | Freq: Two times a day (BID) | INTRAVENOUS | Status: DC
  Administered 2020-05-22 – 2020-05-25 (×6): 750 mg via INTRAVENOUS
  Filled 2020-05-22 (×10): qty 150

## 2020-05-22 NOTE — ED Provider Notes (Signed)
[facility] Emergency Department Provider Note   ____________________________________________   First MD Initiated Contact with Patient 05/22/20 1013     (approximate)  I have reviewed the triage vital signs and the nursing notes.   HISTORY  Chief Complaint Atrial Fibrillation  EM caveat: Patient weak, fatigued, poor historian at this time.  Daughters provide majority history  HPI Joanne [name] is a 86 y.o. female history of UTI, pneumonia, CHF, pleural effusion, and multiple other comorbidities   : Patient having worsening shortness of breath over about a week's time.  Being treated for possible "a "pneumonia" based on some difficulty that she is had with breathing over the last week at her nursing facility.  They did test her negative for Covid on Wednesday.  She is continued to worsen.  Family does report she is also had a history of too much fluid, had to have fluid removed from the lung a few months ago.  Both daughters at the bedside very clear that the patient is DNR and not to be intubated  Worsening symptoms.  No fever they are aware of.  Patient has been receiving "antibiotic shots" and also oral antibiotic for the last several days at her nursing facility but has not had a chest x-ray though they were treating her as though she might have "" pneumonia"  Patient reports pain and discomfort "all over" feels very achy.  Fatigued and weak.  Reports short of breath.  No chest pain.  History of atrial fibrillation review of records indicates she is currently on apixaban  Past Medical History:  Diagnosis Date  . Anemia, unspecified    B12 and iron deficiency   . Arthritis   . Atrial fibrillation ([place])   . B12 deficiency   . Collagen vascular disease ([place])   . Crohn disease ([place])   . Crohn's disease ([place])    followed by Dr. Vira Agar with serial colonoscopies  . Dyspnea   . Fuchs' endothelial dystrophy   . GERD (gastroesophageal reflux disease)   .  History of bone density study 09/01/2012  . History of spinal fracture   . Hyperlipidemia, unspecified   . Hypertension   . Hypokalemia   . Hypomagnesemia   . Osteoporosis   . Other dysphagia 01/24/2017  . Pneumonia    1980's  . Protein calorie malnutrition ([place])   . Retinal detachment   . Right shoulder injury 05/2002   treated conservatively  . Trigger finger   . Zenker diverticulum 09/25/2016    Patient Active Problem List   Diagnosis Date Noted  . Severe sepsis ([place]) 05/22/2020  . Elevated troponin 05/22/2020  . HCAP (healthcare-associated pneumonia) 05/22/2020  . Normocytic anemia 05/22/2020  . Acute on chronic respiratory failure with hypoxia and hypercapnia ([place]) 05/22/2020  . AF (paroxysmal atrial fibrillation) ([place])   . Acute on chronic systolic CHF (congestive heart failure) ([place])   . Atrial fibrillation with RVR ([place])   . Wheeze   . Anal fissure   . Hemorrhoids   . Generalized weakness   . Urinary retention   . Right arm cellulitis 03/15/2020  . Sepsis ([place]) 03/15/2020  . Cellulitis of lower extremity 03/15/2020  . Crohn disease ([place])   . Depression   . HTN (hypertension)   . Proctitis   . Swelling of limb 04/22/2019  . Lymphedema 04/22/2019  . Peripheral edema 04/07/2019  . Hypomagnesemia 03/05/2019  . Arthritis of right knee 09/03/2018  . GERD without esophagitis 08/17/2018  . Spinal  stenosis of lumbar region with radiculopathy 08/17/2018  . Status post kyphoplasty 08/17/2018  . Vertebral compression fracture ([place]) 08/11/2018  . Avascular necrosis of bone of hip, left ([place]) 01/06/2018  . Chronic anemia 12/23/2017  . Crohn's disease ([place]) 12/23/2017  . Fuchs' corneal dystrophy 12/23/2017  . Hyperlipidemia, unspecified 12/23/2017  . Status post total replacement of hip 12/23/2017  . Benign essential HTN 12/16/2017  . Shortness of breath 12/02/2017  . Protein-calorie malnutrition ([place]) 04/21/2017  . Age-related osteoporosis with current pathological  fracture with routine healing 04/03/2017  . Other dysphagia 01/24/2017  . Hypokalemia 01/22/2017  . Zenker diverticulum 09/25/2016  . History of hypertension 05/03/2015  . Hyponatremia 02/21/2015  . Nuclear sclerotic cataract of right eye 09/08/2012  . Right shoulder injury 05/27/2002    Past Surgical History:  Procedure Laterality Date  . APPENDECTOMY  1949  . CATARACT EXTRACTION EXTRACAPSULAR Right 05/10/2015   Procedure: EXTRACTION CATARACT EXTRACAPSULAR W/INSERTION INTRAOCULAR PROSTHESIS; Surgeon: Doyce Para, MD; [facility]; Service: Ophthalmology; Laterality: Right;  . CHOLECYSTECTOMY  1992  . COLONOSCOPY     03/24/1992, 03/11/1998, 07/08/2002, 05/08/2006, 01/31/2012   PH Crohns disease; no repeat due to age per RTE (dw)  . CORNEAL TRANSPLANT Left 07/08/2012   Procedure: CORNEAL TRANSPLANT DSAEK ** tissue ordered OSI 06/27/2012; Surgeon: Doyce Para, MD; Location: [facility]; Service: Ophthalmology;  . ESOPHAGOGASTRODUODENOSCOPY     05/01/2005, 01/31/2012 ; No repeat per RTE  . ESOPHAGOGASTRODUODENOSCOPY (EGD) WITH PROPOFOL N/A 05/30/2018   Procedure: ESOPHAGOGASTRODUODENOSCOPY (EGD) WITH PROPOFOL;  Surgeon: Manya Silvas, MD;  [facility] END[facility]COPY;  Service: Endoscopy;  Laterality: N/A;  . EYE SURGERY  2012, 2013   5 total  . JOINT REPLACEMENT Right 01-28-13  . KYPHOPLASTY N/A 08/12/2018   Procedure: KYPHOPLASTY L3;  Surgeon: Hessie Knows, MD;  Location: [facility] ORS;  Service: Orthopedics;  Laterality: N/A;  . LENSECTOMY PHACOFRAGMENTATION WITH ASPIRATION Left 07/25/2011  . TONSILLECTOMY  1950  . TOTAL HIP ARTHROPLASTY Left 12/23/2017   Procedure: TOTAL HIP ARTHROPLASTY;  Surgeon: Dereck Leep, MD;  Location: [facility] ORS;  Service: Orthopedics;  Laterality: Left;  Marland Kitchen VITREOUS RETINAL SURGERY Left 11/07/2011   PPV/SO  . VITREOUS RETINAL SURGERY Left 04/18/2011   TPPV/TRP/SO/SB/EL/C3F8    Prior to Admission medications   Medication Sig Start Date End Date Taking?  Authorizing Provider  acetaminophen (TYLENOL) 500 MG tablet Take 500 mg by mouth every 8 (eight) hours as needed for mild pain or moderate pain.    Yes [provider]  apixaban (ELIQUIS) 5 MG TABS tablet Take 1 tablet (5 mg total) by mouth 2 (two) times daily. 03/22/20  Yes Wieting, Richard, MD  ascorbic acid (VITAMIN C) 500 MG tablet Take 500 mg by mouth daily.   Yes [provider]  budesonide (ENTOCORT EC) 3 MG 24 hr capsule Take 9 mg by mouth daily.    Yes [provider]  calcium-vitamin D (OSCAL WITH D) 500-200 MG-UNIT TABS tablet Take 1 tablet by mouth 3 (three) times daily.    Yes [provider]  cholecalciferol (VITAMIN D) 1000 units tablet Take 3 tablets by mouth once daily   Yes [provider]  Cyanocobalamin (VITAMIN B-12) 5000 MCG LOZG Subl Place 1 tablet under the tongue once dailY   Yes [provider]  diltiazem (CARDIZEM CD) 120 MG 24 hr capsule Take 1 capsule (120 mg total) by mouth daily. 03/22/20  Yes Wieting, Richard, MD  DULoxetine (CYMBALTA) 20 MG capsule Take 20 mg by mouth  daily.   Yes [provider]  ferrous sulfate 325 (65 FE) MG tablet Take 325 mg by mouth daily with breakfast.   Yes [provider]  gabapentin (NEURONTIN) 300 MG capsule Take 1 capsule (300 mg total) by mouth 3 (three) times daily. Patient taking differently: Take 300 mg by mouth. TAKE 1 CAPSULE BY MOUTH TWICE DAILY AND 2 CAPSULES NIGHTLY AS DIRECTED 03/22/20  Yes Wieting, Richard, MD  hydrocortisone 1 % ointment Apply 1 application topically 3 (three) times daily.   Yes [provider]  ipratropium-albuterol (DUONEB) 0.5-2.5 (3) MG/3ML SOLN Take 3 mLs by nebulization.   Yes [provider]  lisinopril (ZESTRIL) 2.5 MG tablet Take 1 tablet (2.5 mg total) by mouth daily. 03/22/20 05/22/20 Yes Wieting, Richard, MD  liver oil-zinc oxide (DESITIN) 40 % ointment Apply topically 2 (two) times daily. 03/22/20  Yes Wieting,  Richard, MD  loperamide (LOPERAMIDE A-D) 2 MG tablet Take 2 mg by mouth in the morning and at bedtime.  08/14/18  Yes [provider]  magnesium oxide (MAG-OX) 400 MG tablet Take 400 mg by mouth 2 (two) times daily.  04/05/20 04/05/21 Yes [provider]  menthol-cetylpyridinium (CEPACOL) 3 MG lozenge Take 1 lozenge by mouth as needed for sore throat.   Yes [provider]  metoprolol tartrate (LOPRESSOR) 25 MG tablet Take 1 tablet (25 mg total) by mouth 2 (two) times daily. Patient taking differently: Take 25 mg by mouth daily.  03/22/20  Yes Wieting, Richard, MD  Multiple Vitamin (MULTIVITAMIN) tablet Take 1 tablet by mouth daily.   Yes [provider]  NEXIUM 40 MG capsule Take 40 mg by mouth daily.  08/26/13  Yes [provider]  prednisoLONE acetate (PRED FORTE) 1 % ophthalmic suspension Place 1 drop into both eyes at bedtime.   Yes [provider]  saccharomyces boulardii (FLORASTOR) 250 MG capsule Take 250 mg by mouth daily.   Yes [provider]  torsemide (DEMADEX) 20 MG tablet Take 40 mg by mouth every other day.   Yes [provider]  zinc sulfate (ZINC-220) 220 (50 Zn) MG capsule Take 220 mg by mouth daily.    Yes [provider]  colestipol (COLESTID) 1 g tablet Take 1 g by mouth 2 (two) times daily. Do not take other medications within 1 hr before or 1 hr after taking Colestipol.    [provider]  potassium chloride SA (K-DUR,KLOR-CON) 20 MEQ tablet Take 20 mEq by mouth 2 (two) times daily.    [provider]    Allergies Oxycodone  Family History  Problem Relation Age of Onset  . Hypertension Mother   . Stroke Father   . Hypertension Father   . Arthritis Other   . Prostate cancer Other   . Psoriasis Other   . Stroke Other   . Thyroid disease Other   . Anesthesia problems Neg Hx   . Diabetes Neg Hx   . Glaucoma Neg Hx   . Macular degeneration Neg Hx     Social  History Social History   Tobacco Use  . Smoking status: Former Smoker    Packs/day: 0.50    Years: 35.00    Pack years: 17.50    Types: Cigarettes    Quit date: 01/29/1991    Years since quitting: 29.3  . Smokeless tobacco: Never Used  Vaping Use  . Vaping Use: Never used  Substance Use Topics  . Alcohol use: Not Currently    Comment: 3x/yr  .  Drug use: No    Review of Systems  EM caveat.  No known falls or trauma.  No known fever but being treated for possible pneumonia    ____________________________________________   PHYSICAL EXAM:  VITAL SIGNS: ED Triage Vitals  Enc Vitals Group     BP 05/22/20 1010 119/72     Pulse Rate 05/22/20 1010 (!) 159     Resp 05/22/20 1010 (!) 29     Temp --      Temp src --      SpO2 05/22/20 1010 (!) 86 %     Weight 05/22/20 1012 157 lb (71.2 kg)     Height 05/22/20 1012 5' (1.524 m)     Head Circumference --      Peak Flow --      Pain Score 05/22/20 1011 10     Pain Loc --      Pain Edu? --      Excl. in [place]? --     Constitutional: Alert and oriented.  Ill-appearing, moderately dyspneic.  Use of accessory muscles as well as abdominal musculature for breathing. Eyes: Conjunctivae are normal. Head: Atraumatic. Nose: No congestion/rhinnorhea. Mouth/Throat: Mucous membranes are somewhat dry. Neck: No stridor.  Cardiovascular: Quite tachycardic, irregular rate about 1 50-1 70 grossly normal heart sounds.  Good peripheral circulation. Respiratory: Moderate distress.  Crackles in the bases bilateral.  Diminished lung sounds bilateral.  No wheezing.  No noted cyanosis Gastrointestinal: Soft and nontender. No distention.  Use of abdominal musculature for breathing. Musculoskeletal: No lower extremity tenderness slight edema lower extremities bilateral. Neurologic:  Normal speech and language. No gross focal neurologic deficits are appreciated.  Skin:  Skin is warm, dry and intact. No rash noted. Psychiatric: Mood and affect are  slightly anxious  ____________________________________________   LABS (all labs ordered are listed, but only abnormal results are displayed)  Labs Reviewed  LACTIC ACID, PLASMA - Abnormal; Notable for the following components:      Result Value   Lactic Acid, Venous 4.3 (*)    All other components within normal limits  LACTIC ACID, PLASMA - Abnormal; Notable for the following components:   Lactic Acid, Venous 3.8 (*)    All other components within normal limits  COMPREHENSIVE METABOLIC PANEL - Abnormal; Notable for the following components:   Chloride 96 (*)    Glucose, Bld 185 (*)    Calcium 8.4 (*)    Total Protein 5.5 (*)    Albumin 2.5 (*)    All other components within normal limits  CBC WITH DIFFERENTIAL/PLATELET - Abnormal; Notable for the following components:   WBC 25.1 (*)    RBC 3.39 (*)    Hemoglobin 9.4 (*)    HCT 31.2 (*)    RDW 15.6 (*)    Neutro Abs 22.4 (*)    Monocytes Absolute 1.1 (*)    Abs Immature Granulocytes 0.21 (*)    All other components within normal limits  BRAIN NATRIURETIC PEPTIDE - Abnormal; Notable for the following components:   B Natriuretic Peptide 1,598.8 (*)    All other components within normal limits  BLOOD GAS, VENOUS - Abnormal; Notable for the following components:   pCO2, Ven 65 (*)    Bicarbonate 32.7 (*)    Acid-Base Excess 5.2 (*)    All other components within normal limits  TROPONIN I (HIGH SENSITIVITY) - Abnormal; Notable for the following components:   Troponin I (High Sensitivity) 39 (*)    All other components within  normal limits  TROPONIN I (HIGH SENSITIVITY) - Abnormal; Notable for the following components:   Troponin I (High Sensitivity) 65 (*)    All other components within normal limits  RESPIRATORY PANEL BY RT PCR (FLU A&B, COVID)  CULTURE, BLOOD (SINGLE)  URINE CULTURE  CULTURE, BLOOD (SINGLE)  MRSA PCR SCREENING  PROTIME-INR  APTT  URINALYSIS, COMPLETE (UACMP) WITH MICROSCOPIC  PROCALCITONIN    ____________________________________________  EKG  Reviewed inter by me at 1010 Heart rate 180 QRS 60 QTc 420 Atrial fibrillation, rapid ventricular response.  Nonspecific T wave abnormality.  No STEMI ____________________________________________  RADIOLOGY  DG Chest Port 1 View  Result Date: 05/22/2020 CLINICAL DATA:  Shortness of breath. EXAM: PORTABLE CHEST 1 VIEW COMPARISON:  03/23/2020 FINDINGS: Cardiomegaly again noted. Pulmonary vascular congestion, unchanged LEFT interstitial opacity/airspace disease and new RIGHT interstitial/mild airspace opacities are identified, favor edema over infection. Probable small bilateral pleural effusions noted. LEFT LOWER lung consolidation/atelectasis again noted. There is no evidence of pneumothorax. No acute bony abnormalities are identified. IMPRESSION: 1. Cardiomegaly with pulmonary vascular congestion, LEFT interstitial/airspace disease and new RIGHT interstitial/mild airspace opacities, favor edema over infection. 2. Probable small bilateral pleural effusions. 3. LEFT LOWER lung consolidation/atelectasis. Electronically Signed   By: Margarette Canada M.D.   On: 05/22/2020 10:54    Chest x-ray reviewed, cardiomegaly vascular congestion.  Concern for acute CHF and edema.  Also possible bilateral pleural effusions.  Potential atelectasis versus consolidation as well noted left lower. ____________________________________________   PROCEDURES  Procedure(s) performed: None  Procedures  Critical Care performed: Yes, see critical care note(s) CRITICAL CARE Performed by: Delman Kitten   Total critical care time: 35 minutes  Critical care time was exclusive of separately billable procedures and treating other patients.  Critical care was necessary to treat or prevent imminent or life-threatening deterioration.  Critical care was time spent personally by me on the following activities: development of treatment plan with patient and/or surrogate as  well as nursing, discussions with consultants, evaluation of patient's response to treatment, examination of patient, obtaining history from patient or surrogate, ordering and performing treatments and interventions, ordering and review of laboratory studies, ordering and review of radiographic studies, pulse oximetry and re-evaluation of patient's condition.  ____________________________________________   INITIAL IMPRESSION / ASSESSMENT AND PLAN / ED COURSE  Pertinent labs & imaging results that were available during my care of the patient were reviewed by me and considered in my medical decision making (see chart for details).   Reduced EF 30 to 35%.  History of A. fib.  Presenting now with worsening shortness of breath treated for possible "pneumonia" over the last week with antibiotics at her nursing facility.  She does appear critically ill, moderate distress.  She is DNR, due to her increased work of breathing will trial BiPAP, she is able to maintain saturations on nasal cannula but appears quite dyspneic.  I am concerned about her work of breathing, also based on her history certainly infection, CHF, volume overload, pleural effusion are high on her differential as well as multiple other causes.  Will test for Covid, await chest x-ray etc.  Patient has most form with her that clearly demonstrates she is no CPR, DO NOT INTUBATE and this is also noted wish of the family at the bedside.  Patient is fatigued, dyspneic, unable to participate in clear discussion as she appears slightly confused though oriented  Clinical Course as of May 22 1433  Sun May 22, 2020  1045 Consult discussed with Dr.  Laurelyn Sickle of cardiology. Will see in consult this AM   [MQ]  1059 Cardiology at bedside per RN   [MQ]  1126 Discussed with Dr. Yancey Flemings, high concern the patient has congestive heart failure.  Patient is tolerating BiPAP well.  He had noted a blood pressure in the 70s, on reassessment though her blood pressure  remains 038 systolic at this time, no severe hypotension noted on reassessments.  I discussed further with him we will give digoxin IV, and discussed concern for possible sepsis given her severe leukocytosis, advises and agrees with broad-spectrum antibiotic.  Have paged hospitalist for further admission and treatment.  If blood pressure sustain without severe hypotension I anticipate I will trial small dose of Lasix, though in her particular case it is hard to balance fluid management between the concerns for possible multifocal pneumonia and sepsis versus those of CHF.   [MQ]  1133 There is a high concern for sepsis and the patient has lactic acidosis with lactate greater than 4 indicative of severe sepsis however I also believe the patient has acute congestive heart failure.  Cardiology considering benefits of diuresis, recommending against fluid bolusing at this time having discussed with Dr. Humphrey Rolls   [MQ]  1144 Had a lengthy discussion with both the patient's daughters who are at the bedside, all in agreement the patient would not wish for any heroic measures.  Family would not wish for her to receive a central line procedure.  Patient is not to be intubated, no CPR.  She is DNR.  We are moving somewhat in a comfort measure direction as the patient is now becoming hypotensive with blood pressure in the 70s, less alert but continuing to take good volumes on BiPAP and alerts to voice.  Family would like to prioritize comfort of the patient overall other at this point, but agreeable with receiving fluids and digoxin as well as antibiotics.  Will give small fluid bolus at this time, but concerned the patient has acute decompensated heart failure in the setting of severe sepsis with leukocytosis, multifocal infiltrative type pattern.  Await procalcitonin, cultures etc.  Have paged hospitalist to request admission   [MQ]    Clinical Course User Index [MQ] Delman Kitten, MD    Admitted to Dr. Blaine Hamper.    ____________________________________________   FINAL CLINICAL IMPRESSION(S) / ED DIAGNOSES  Final diagnoses:  Acute congestive heart failure, unspecified heart failure type ([place])  Hypoxia  HCAP (healthcare-associated pneumonia)  Septic shock ([place])        Note:  This document was prepared using Dragon voice recognition software and may include unintentional dictation errors       Delman Kitten, MD 05/22/20 1434

## 2020-05-22 NOTE — Consult Note (Signed)
PHARMACY -  BRIEF ANTIBIOTIC NOTE   Pharmacy has received consult(s) for vancomycin and cefepime from an ED provider.  The patient's profile has been reviewed for ht/wt/allergies/indication/available labs.    One time order(s) placed for cefepime 2 g IV x 1 dose and vancomycin 1000 mg IV x 1 dose per EDP   Further antibiotics/pharmacy consults should be ordered by admitting physician if indicated.                       Thank you,  Benn Moulder, PharmD Pharmacy Resident  05/22/2020 11:30 AM

## 2020-05-22 NOTE — ED Triage Notes (Addendum)
Patient arrived by EMS from [facility] in [place]. initially called out for SOB. Does not wear O2 baseline. HX A-fib. EMS reports HR of 170-200, crackles in bilateral lungs. Patient keeps repeating "help"

## 2020-05-22 NOTE — ED Notes (Signed)
Pt's children requested to be called with changes in pt status or possible room placement.  [phone] Shanon Brow (son) requested to be called, at any time during night.

## 2020-05-22 NOTE — ED Notes (Signed)
RT at bedside placing bipap

## 2020-05-22 NOTE — Progress Notes (Signed)
Ch visited with Joanne [name] and daughter [name] and Jocelyn Lamer after RN paged Ch that daughters requested to speak with a chaplain. MD giving update on treatments to daughters when Ch entered. They requested Ch to pray with their mom (Joanne [name]). Joanne [name] awakened, held Ch's hand for prayer. Ch prayed with them. Daughters tearful; will let RN know if further chaplain support is needed.

## 2020-05-22 NOTE — Consult Note (Signed)
Pharmacy Antibiotic Note  Joanne [name] is a 86 y.o. female admitted on 05/22/2020 with healthcare associated pneumonia. Pt presented with shortness of breath and respiratory distress. Pt was recently hospitalized with UTI 7/20-7/29 - received ceftriaxone and vancomycin during admission. PMH includes atrial fibrillation, Crohn's, GERD, HLD, and HTN. Pt received cefepime 2 g IV and vancomycin 1 g IV x 1 dose in ED. Pharmacy has been consulted for vancomycin and cefepime dosing.  Plan: - Vancomycin 750 mg IV q12h starting now to complete loading dose of 1750 mg. Goal trough 15-20 mcg/mL.  - Cefepime 2 g q12h - Monitor Scr with AM labs  Height: 5' (152.4 cm) Weight: 71.2 kg (157 lb) IBW/kg (Calculated) : 45.5  Temp (24hrs), Avg:98.7 F (37.1 C), Min:98.7 F (37.1 C), Max:98.7 F (37.1 C)  Recent Labs  Lab 05/22/20 1018 05/22/20 1111  WBC 25.1*  --   CREATININE 0.75  --   LATICACIDVEN 4.3* 3.8*    Estimated Creatinine Clearance: 44.5 mL/min (by C-G formula based on SCr of 0.75 mg/dL).    Allergies  Allergen Reactions  . Oxycodone Other (See Comments)    "makes her crazy"    Antimicrobials this admission: 9/26 cefepime >> 9/26 vancomycin >>  Microbiology results: 9/26 BCx: pending 9/26 UCx: pending 9/26 MRSA PCR: pending  Thank you for allowing pharmacy to be a part of this patient's care.  Benn Moulder, PharmD Pharmacy Resident  05/22/2020 1:37 PM

## 2020-05-22 NOTE — ED Notes (Signed)
Family given water

## 2020-05-22 NOTE — ED Notes (Signed)
Lab at bedside

## 2020-05-22 NOTE — H&P (Signed)
History and Physical    AMANDAJO GONDER VEL:[id number] DOB: August 16, 1931 DOA: 05/22/2020  Referring MD/NP/PA:   PCP: Tracie Harrier, MD   Patient coming from:  The patient is coming from SNF.  At baseline, pt is dependent for most of ADL.        Chief Complaint: SOB  HPI: CHRISTYANA CORWIN is a 86 y.o. female with medical history significant of hypertension, hyperlipidemia, GERD, depression, Fuch's endothelial dystrophy, Crohn's disease, anemia, Zenker's diverticulum, a fib on Eliquis, sCHF, anemia, who presents with SOB.  Per her 2 daughters at the bedside, patient has been having shortness of breath for more than 1 week.  Patient is currently being treated for possible pneumonia with antibiotics in the facility.  Her symptoms has been progressively worsening.  Patient has nonproductive cough.  She complains of pain all over, does not seem to have chest pain.  Patient has chronic intermittent diarrhea due to Crohn's disease, which has not changed.  Currently no active nausea, vomiting or abdominal pain.  No fever or chills.  No symptoms of UTI or unilateral weakness.  At her normal baseline, patient recognizes family and is orientated to place most of the time, but intermittently confused about time.  Per her 2 daughters, patient's mental status is at her baseline today.   Both daughters at the bedside are very clear that the patient is DNR and not to be intubated, but said that it is okay to give vasopressor if patient needs.  Patient was found to have A. fib with RVR, with heart rates of 170-200.  Systolic blood pressure at 70s.  Cardiology, Dr. Humphrey Rolls is consulted.  Patient was given 1 dose of digoxin 0.5 mg and Cardizem 10 mg per Dr. Humphrey Rolls.  Her heart rate decreased to 110s currently.  Patient was also given 500 cc normal saline in ED.  Blood pressure gradually improved to 100/87, then 119/79.  ED Course: pt was found to have WBC 25, BNP 1598, lactic acid 4.3, 3.8, INR 1.01, PTT 31, troponin 39,  negative Covid PCR, electrolytes renal function okay, temperature normal, tachypnea with RR 29.  Patient is admitted to stepdown as inpatient.  CXR showed: 1. Cardiomegaly with pulmonary vascular congestion, LEFT interstitial/airspace disease and new RIGHT interstitial/mild airspace opacities, favor edema over infection. 2. Probable small bilateral pleural effusions. 3. LEFT LOWER lung consolidation/atelectasis.   Review of Systems:   General: no fevers, chills, no body weight gain, has fatigue HEENT: no blurry vision, hearing changes or sore throat Respiratory: has dyspnea, coughing, no wheezing CV: no chest pain, no palpitations GI: no nausea, vomiting, abdominal pain, has diarrhea, no constipation GU: no dysuria, burning on urination, increased urinary frequency, hematuria  Ext: has leg edema Neuro: no unilateral weakness, numbness, or tingling, no vision change or hearing loss Skin: no rash, no skin tear. MSK: No muscle spasm, no deformity, no limitation of range of movement in spin Heme: No easy bruising.  Travel history: No recent long distant travel.  Allergy:  Allergies  Allergen Reactions  . Oxycodone Other (See Comments)    "makes her crazy"    Past Medical History:  Diagnosis Date  . Anemia, unspecified    B12 and iron deficiency   . Arthritis   . Atrial fibrillation ([place])   . B12 deficiency   . Collagen vascular disease ([place])   . Crohn disease ([place])   . Crohn's disease ([place])    followed by Dr. Vira Agar with serial colonoscopies  . Dyspnea   .  Fuchs' endothelial dystrophy   . GERD (gastroesophageal reflux disease)   . History of bone density study 09/01/2012  . History of spinal fracture   . Hyperlipidemia, unspecified   . Hypertension   . Hypokalemia   . Hypomagnesemia   . Osteoporosis   . Other dysphagia 01/24/2017  . Pneumonia    1980's  . Protein calorie malnutrition ([place])   . Retinal detachment   . Right shoulder injury 05/2002   treated  conservatively  . Trigger finger   . Zenker diverticulum 09/25/2016    Past Surgical History:  Procedure Laterality Date  . APPENDECTOMY  1949  . CATARACT EXTRACTION EXTRACAPSULAR Right 05/10/2015   Procedure: EXTRACTION CATARACT EXTRACAPSULAR W/INSERTION INTRAOCULAR PROSTHESIS; Surgeon: Doyce Para, MD; [facility]; Service: Ophthalmology; Laterality: Right;  . CHOLECYSTECTOMY  1992  . COLONOSCOPY     03/24/1992, 03/11/1998, 07/08/2002, 05/08/2006, 01/31/2012   PH Crohns disease; no repeat due to age per RTE (dw)  . CORNEAL TRANSPLANT Left 07/08/2012   Procedure: CORNEAL TRANSPLANT DSAEK ** tissue ordered OSI 06/27/2012; Surgeon: Doyce Para, MD; Location: [facility]; Service: Ophthalmology;  . ESOPHAGOGASTRODUODENOSCOPY     05/01/2005, 01/31/2012 ; No repeat per RTE  . ESOPHAGOGASTRODUODENOSCOPY (EGD) WITH PROPOFOL N/A 05/30/2018   Procedure: ESOPHAGOGASTRODUODENOSCOPY (EGD) WITH PROPOFOL;  Surgeon: Manya Silvas, MD;  Location: [facility] ENDOSCOPY;  Service: Endoscopy;  Laterality: N/A;  . EYE SURGERY  2012, 2013   5 total  . JOINT REPLACEMENT Right 01-28-13  . KYPHOPLASTY N/A 08/12/2018   Procedure: KYPHOPLASTY L3;  Surgeon: Hessie Knows, MD;  Location: [facility] ORS;  Service: Orthopedics;  Laterality: N/A;  . LENSECTOMY PHACOFRAGMENTATION WITH ASPIRATION Left 07/25/2011  . TONSILLECTOMY  1950  . TOTAL HIP ARTHROPLASTY Left 12/23/2017   Procedure: TOTAL HIP ARTHROPLASTY;  Surgeon: Dereck Leep, MD;  Location: [facility] ORS;  Service: Orthopedics;  Laterality: Left;  Marland Kitchen VITREOUS RETINAL SURGERY Left 11/07/2011   PPV/SO  . VITREOUS RETINAL SURGERY Left 04/18/2011   TPPV/TRP/SO/SB/EL/C3F8    Social History:  reports that she quit smoking about 29 years ago. Her smoking use included cigarettes. She has a 17.50 pack-year smoking history. She has never used smokeless tobacco. She reports previous alcohol use. She reports that she does not use drugs.  Family History:  Family History  Problem  Relation Age of Onset  . Hypertension Mother   . Stroke Father   . Hypertension Father   . Arthritis Other   . Prostate cancer Other   . Psoriasis Other   . Stroke Other   . Thyroid disease Other   . Anesthesia problems Neg Hx   . Diabetes Neg Hx   . Glaucoma Neg Hx   . Macular degeneration Neg Hx      Prior to Admission medications   Medication Sig Start Date End Date Taking? Authorizing Provider  acetaminophen (TYLENOL) 500 MG tablet Take 500 mg by mouth every 8 (eight) hours as needed for mild pain or moderate pain.     [provider]  apixaban (ELIQUIS) 5 MG TABS tablet Take 1 tablet (5 mg total) by mouth 2 (two) times daily. 03/22/20   Loletha Grayer, MD  ascorbic acid (VITAMIN C) 500 MG tablet Take 500 mg by mouth daily.    [provider]  budesonide (ENTOCORT EC) 3 MG 24 hr capsule Take 9 mg by mouth daily.     [provider]  calcium-vitamin D (OSCAL WITH D) 500-200 MG-UNIT TABS tablet Take 1 tablet by mouth 3 (  three) times daily.     [provider]  cholecalciferol (VITAMIN D) 1000 units tablet Take 3 tablets by mouth once daily    [provider]  Cyanocobalamin (VITAMIN B-12) 5000 MCG LOZG Subl Place 1 tablet under the tongue once dailY    [provider]  diltiazem (CARDIZEM CD) 120 MG 24 hr capsule Take 1 capsule (120 mg total) by mouth daily. 03/22/20   Loletha Grayer, MD  DULoxetine (CYMBALTA) 20 MG capsule Take 20 mg by mouth daily.    [provider]  gabapentin (NEURONTIN) 300 MG capsule Take 1 capsule (300 mg total) by mouth 3 (three) times daily. 03/22/20   Loletha Grayer, MD  hydrocortisone cream 1 % Apply 3 times a day to anal surface 03/22/20   Loletha Grayer, MD  lidocaine (LIDODERM) 5 % Apply topically to right knee in the AM and Remove & Discard patch at night within 12 hours or as directed by MD for uncontrolled knee pain  08/23/18   [provider]  lidocaine (XYLOCAINE) 2 %  solution Use as directed 15 mLs in the mouth or throat every 4 (four) hours as needed for mouth pain. 03/22/20   Loletha Grayer, MD  lisinopril (ZESTRIL) 2.5 MG tablet Take 1 tablet (2.5 mg total) by mouth daily. 03/22/20 04/21/20  Loletha Grayer, MD  liver oil-zinc oxide (DESITIN) 40 % ointment Apply topically 2 (two) times daily. 03/22/20   Loletha Grayer, MD  loperamide (LOPERAMIDE A-D) 2 MG tablet Take 2 mg by mouth 3 (three) times daily.  08/14/18   [provider]  magnesium oxide (MAG-OX) 400 (241.3 Mg) MG tablet Take 400 mg by mouth daily.    [provider]  menthol-cetylpyridinium (CEPACOL) 3 MG lozenge Take 1 lozenge (3 mg total) by mouth as needed for sore throat. 03/22/20   Loletha Grayer, MD  metoprolol tartrate (LOPRESSOR) 25 MG tablet Take 1 tablet (25 mg total) by mouth 2 (two) times daily. 03/22/20   Loletha Grayer, MD  Multiple Vitamin (MULTIVITAMIN) tablet Take 1 tablet by mouth daily.    [provider]  NEXIUM 40 MG capsule Take 40 mg by mouth daily.  08/26/13   [provider]  potassium chloride SA (K-DUR,KLOR-CON) 20 MEQ tablet Take 20 mEq by mouth 2 (two) times daily.    [provider]  prednisoLONE acetate (PRED FORTE) 1 % ophthalmic suspension Place 1 drop into both eyes at bedtime.    [provider]  torsemide (DEMADEX) 20 MG tablet Take 1 tablet (20 mg total) by mouth daily. 03/22/20   Loletha Grayer, MD  traZODone (DESYREL) 50 MG tablet Take 1 tablet (50 mg total) by mouth at bedtime as needed for sleep. 03/22/20   Loletha Grayer, MD  zinc sulfate (ZINC-220) 220 (50 Zn) MG capsule Take 220 mg by mouth daily.    [provider]    Physical Exam: Vitals:   05/22/20 1630 05/22/20 1700 05/22/20 1730 05/22/20 1800  BP: 121/72 (!) 114/44 (!) 110/46 (!) 118/49  Pulse:  (!) 101 90 82  Resp: 15 17 14 18   Temp:      TempSrc:      SpO2:  100% 100% 100%  Weight:      Height:       General: Not in  acute distress HEENT:       Eyes: PERRL, EOMI, no scleral icterus.       ENT: No discharge from the ears and nose, no pharynx injection, no tonsillar  enlargement.        Neck: No JVD, no bruit, no mass felt. Heme: No neck lymph node enlargement. Cardiac: S1/S2, irregularly irregular rhythm, tachycardia, No murmurs, No gallops or rubs. Respiratory: Has crackles bilaterally GI: Soft, nondistended, nontender, no rebound pain, no organomegaly, BS present. GU: No hematuria Ext: 1+ leg edema bilaterally. 1+DP/PT pulse bilaterally. Musculoskeletal: No joint deformities, No joint redness or warmth, no limitation of ROM in spin. Skin: No rashes.  Neuro: Alert, cranial nerves II-XII grossly intact, moves all extremities normally.  Psych: Patient is not psychotic, no suicidal or hemocidal ideation.  Labs on Admission: I have personally reviewed following labs and imaging studies  CBC: Recent Labs  Lab 05/22/20 1018  WBC 25.1*  NEUTROABS 22.4*  HGB 9.4*  HCT 31.2*  MCV 92.0  PLT 932   Basic Metabolic Panel: Recent Labs  Lab 05/22/20 1018  NA 140  K 3.5  CL 96*  CO2 29  GLUCOSE 185*  BUN 20  CREATININE 0.75  CALCIUM 8.4*   GFR: Estimated Creatinine Clearance: 44.5 mL/min (by C-G formula based on SCr of 0.75 mg/dL). Liver Function Tests: Recent Labs  Lab 05/22/20 1018  AST 36  ALT 29  ALKPHOS 102  BILITOT 0.6  PROT 5.5*  ALBUMIN 2.5*   No results for input(s): LIPASE, AMYLASE in the last 168 hours. No results for input(s): AMMONIA in the last 168 hours. Coagulation Profile: Recent Labs  Lab 05/22/20 1018  INR 1.1   Cardiac Enzymes: No results for input(s): CKTOTAL, CKMB, CKMBINDEX, TROPONINI in the last 168 hours. BNP (last 3 results) No results for input(s): PROBNP in the last 8760 hours. HbA1C: No results for input(s): HGBA1C in the last 72 hours. CBG: No results for input(s): GLUCAP in the last 168 hours. Lipid Profile: No results for input(s): CHOL, HDL,  LDLCALC, TRIG, CHOLHDL, LDLDIRECT in the last 72 hours. Thyroid Function Tests: No results for input(s): TSH, T4TOTAL, FREET4, T3FREE, THYROIDAB in the last 72 hours. Anemia Panel: No results for input(s): VITAMINB12, FOLATE, FERRITIN, TIBC, IRON, RETICCTPCT in the last 72 hours. Urine analysis:    Component Value Date/Time   COLORURINE YELLOW (A) 05/22/2020 1618   APPEARANCEUR HAZY (A) 05/22/2020 1618   APPEARANCEUR Clear 01/12/2013 1321   LABSPEC 1.013 05/22/2020 1618   LABSPEC 1.011 01/12/2013 1321   PHURINE 5.0 05/22/2020 1618   GLUCOSEU NEGATIVE 05/22/2020 1618   GLUCOSEU Negative 01/12/2013 1321   HGBUR NEGATIVE 05/22/2020 1618   BILIRUBINUR NEGATIVE 05/22/2020 1618   BILIRUBINUR Negative 01/12/2013 1321   KETONESUR NEGATIVE 05/22/2020 1618   PROTEINUR 30 (A) 05/22/2020 1618   NITRITE NEGATIVE 05/22/2020 1618   LEUKOCYTESUR LARGE (A) 05/22/2020 1618   LEUKOCYTESUR Negative 01/12/2013 1321   Sepsis Labs: @LABRCNTIP (procalcitonin:4,lacticidven:4) ) Recent Results (from the past 240 hour(s))  Respiratory Panel by RT PCR (Flu A&B, Covid) - Nasopharyngeal Swab     Status: None   Collection Time: 05/22/20 10:19 AM   Specimen: Nasopharyngeal Swab  Result Value Ref Range Status   SARS Coronavirus 2 by RT PCR NEGATIVE NEGATIVE Final    Comment: (NOTE) SARS-CoV-2 target nucleic acids are NOT DETECTED.  The SARS-CoV-2 RNA is generally detectable in upper respiratoy specimens during the acute phase of infection. The lowest concentration of SARS-CoV-2 viral copies this assay can detect is 131 copies/mL. A negative result does not preclude SARS-Cov-2 infection and should not be used as the sole basis for treatment or other patient management decisions. A negative result may occur with  improper specimen collection/handling, submission of specimen other than nasopharyngeal swab, presence of viral mutation(s) within the areas targeted by this assay, and inadequate number of viral  copies (<131 copies/mL). A negative result must be combined with clinical observations, patient history, and epidemiological information. The expected result is Negative.  Fact Sheet for Patients:  PinkCheek.be  Fact Sheet for Healthcare Providers:  GravelBags.it  This test is no t yet approved or cleared by the [place] FDA and  has been authorized for detection and/or diagnosis of SARS-CoV-2 by FDA under an Emergency Use Authorization (EUA). This EUA will remain  in effect (meaning this test can be used) for the duration of the COVID-19 declaration under Section 564(b)(1) of the Act, 21 U.S.C. section 360bbb-3(b)(1), unless the authorization is terminated or revoked sooner.     Influenza A by PCR NEGATIVE NEGATIVE Final   Influenza B by PCR NEGATIVE NEGATIVE Final    Comment: (NOTE) The Xpert Xpress SARS-CoV-2/FLU/RSV assay is intended as an aid in  the diagnosis of influenza from Nasopharyngeal swab specimens and  should not be used as a sole basis for treatment. Nasal washings and  aspirates are unacceptable for Xpert Xpress SARS-CoV-2/FLU/RSV  testing.  Fact Sheet for Patients: PinkCheek.be  Fact Sheet for Healthcare Providers: GravelBags.it  This test is not yet approved or cleared by the [place] FDA and  has been authorized for detection and/or diagnosis of SARS-CoV-2 by  FDA under an Emergency Use Authorization (EUA). This EUA will remain  in effect (meaning this test can be used) for the duration of the  Covid-19 declaration under Section 564(b)(1) of the Act, 21  U.S.C. section 360bbb-3(b)(1), unless the authorization is  terminated or revoked. Performed at [facility], [address] [place], [place] [address]      Radiological Exams on Admission: DG Chest [facility] 1 View  Result Date: 05/22/2020 CLINICAL DATA:  Shortness of  breath. EXAM: PORTABLE CHEST 1 VIEW COMPARISON:  03/23/2020 FINDINGS: Cardiomegaly again noted. Pulmonary vascular congestion, unchanged LEFT interstitial opacity/airspace disease and new RIGHT interstitial/mild airspace opacities are identified, favor edema over infection. Probable small bilateral pleural effusions noted. LEFT LOWER lung consolidation/atelectasis again noted. There is no evidence of pneumothorax. No acute bony abnormalities are identified. IMPRESSION: 1. Cardiomegaly with pulmonary vascular congestion, LEFT interstitial/airspace disease and new RIGHT interstitial/mild airspace opacities, favor edema over infection. 2. Probable small bilateral pleural effusions. 3. LEFT LOWER lung consolidation/atelectasis. Electronically Signed   By: Margarette Canada M.D.   On: 05/22/2020 10:54     EKG: Independently reviewed.  Atrial fibrillation with RVR, heart rate of 179, QTc 413, low voltage, poor R wave progression, anteroseptal infarction pattern Assessment/Plan Principal Problem:   Acute on chronic respiratory failure with hypoxia and hypercapnia (HCC) Active Problems:   Benign essential HTN   GERD without esophagitis   Crohn disease (HCC)   Depression   Atrial fibrillation with RVR (HCC)   Acute on chronic systolic CHF (congestive heart failure) (HCC)   Severe sepsis (HCC)   Elevated troponin   HCAP (healthcare-associated pneumonia)   Normocytic anemia   Acute on chronic respiratory failure with hypoxia and hypercapnia due to acute on chronic systolic CHF exacerbation and possible HCAP.  Patient has 1+ leg edema, crackles on auscultation, vascular congestion on chest x-ray, elevated BNP 1598, clinically consistent with CHF exacerbation.  Patient has leukocytosis with WBC 25, chest x-ray showed bilateral scattered airspace disease, indicating possible HCAP.  -Admit to stepdown C inpatient -Antibiotics for HCAP -Continue  BiPAP and bronchodilators -IV Lasix carefully  Acute on chronic  systolic CHF: 2D echo on 9/47/0962 showed a EF of 30-35%.  Now has a CHF exacerbation.  It is very difficult situation, since patienthas sepsis, ideally needs IV fluid, but the patient also has fluid overload.  Initially patient has hypotension, now blood pressure improved.  Cardiology, Dr. Humphrey Rolls is consulted. -Lasix 20 mg twice daily per Dr. Humphrey Rolls -2d echo -Daily weights -strict I/O's -Low salt diet -Fluid restriction -Obtain REDs Vest reading  Severe sepsis due to possible HCAP (healthcare-associated pneumonia): Patient meets criteria for severe sepsis with leukocytosis, tachycardia, tachypnea with heart rate of 29, also has hypotension.  Lactic acid is elevated 4.3 -->3.8.  Patient has received 500 cc of normal saline, will not give more IV fluid due to fluid overload. - IV Vancomycin and cefepime - Mucinex for cough  - Bronchodilators - Urine legionella and S. pneumococcal antigen - Follow up blood culture x2, sputum culture  - will get Procalcitonin and trend lactic acid level per sepsis protocol - IVF: 500 cc NS  Atrial fibrillation with RVR: HR 170-200. Her Cardiology, Dr. Humphrey Rolls is consulted.  Patient was given 1 dose of digoxin 0.5 mg and Cardizem 10 mg per Dr. Humphrey Rolls.  Her heart rate decreased to 110s currently.  -continue Eliquis -Cardizem, Metoprolol  Elevated troponin: Troponin 39, most likely due to demand ischemia. -Trend troponin -Check A1c, FLP -Repeat EKG in morning  Benign essential HTN -on IV Lasix -On Cardizem and metoprolol -Monitor blood pressure closely, if developed hypotension -->will hold all blood pressure medications  GERD without esophagitis -Protonix  Crohn disease (HCC) -Budesonide -As needed Imodium  Depression -Continue home medications  Normocytic anemia: Hgb 9.4 -f/u by CBC         DVT ppx: on Eliquis Code Status: DNR (pt has DNR form from facility.  Per her two daughters, it is okay to use vasopressor if patient needed) Family  Communication:  Yes, patient's two daughters at bed side Disposition Plan:  Anticipate discharge back to previous SNF environment Consults called:  Dr. Humphrey Rolls of card Admission status: SDU/inpation       Status is: Inpatient  Remains inpatient appropriate because:Inpatient level of care appropriate due to severity of illness   Dispo: The patient is from: SNF              Anticipated d/c is to: SNF              Anticipated d/c date is: 2 days              Patient currently is not medically stable to d/c.           Date of Service 05/22/2020    Ivor Costa [facility]   If 7PM-7AM, please contact night-coverage [email or web address] 05/22/2020, 7:16 PM

## 2020-05-22 NOTE — ED Notes (Addendum)
Dr Humphrey Rolls, Cardiology at bedside

## 2020-05-22 NOTE — Consult Note (Signed)
Joanne [name] is a 86 y.o. female  [id number]  Primary Cardiologist: Dr. Ubaldo Glassing Reason for Consultation: Atrial fibrillation with rapid ventricular response rate and congestive heart failure.  HPI: This 86 year old white female with a past medical history of chronic atrial fibrillation congestive heart failure with ejection fraction 30 to 35% was at nursing home in [place] and was sent here because of worsening shortness of breath.  She was recently in the hospital with UTI and now came back again with shortness of breath with respiratory distress.  I was consulted because patient is having A. fib with ventricular rate 166 and BP systolic 70.  Apparently patient is also DNR.   Review of Systems: History obtained from daughters   Past Medical History:  Diagnosis Date  . Anemia, unspecified    B12 and iron deficiency   . Arthritis   . Atrial fibrillation ([place])   . B12 deficiency   . Collagen vascular disease ([place])   . Crohn disease ([place])   . Crohn's disease ([place])    followed by Dr. Vira Agar with serial colonoscopies  . Dyspnea   . Fuchs' endothelial dystrophy   . GERD (gastroesophageal reflux disease)   . History of bone density study 09/01/2012  . History of spinal fracture   . Hyperlipidemia, unspecified   . Hypertension   . Hypokalemia   . Hypomagnesemia   . Osteoporosis   . Other dysphagia 01/24/2017  . Pneumonia    1980's  . Protein calorie malnutrition ([place])   . Retinal detachment   . Right shoulder injury 05/2002   treated conservatively  . Trigger finger   . Zenker diverticulum 09/25/2016    (Not in a hospital admission)      Infusions:   Allergies  Allergen Reactions  . Oxycodone Other (See Comments)    "makes her crazy"    Social History   Socioeconomic History  . Marital status: Widowed    Spouse name: Not on file  . Number of children: 2  . Years of education: college  . Highest education level: Not on file  Occupational History  .  Occupation: retired    Comment: Pharmacist, hospital  Tobacco Use  . Smoking status: Former Smoker    Packs/day: 0.50    Years: 35.00    Pack years: 17.50    Types: Cigarettes    Quit date: 01/29/1991    Years since quitting: 29.3  . Smokeless tobacco: Never Used  Vaping Use  . Vaping Use: Never used  Substance and Sexual Activity  . Alcohol use: Not Currently    Comment: 3x/yr  . Drug use: No  . Sexual activity: Never  Other Topics Concern  . Not on file  Social History Narrative   Widowed   Former smoker   Occasional drink 3 x a year   2 children    Full Code   Social Determinants of Health   Financial Resource Strain:   . Difficulty of Paying Living Expenses: Not on file  Food Insecurity:   . Worried About Charity fundraiser in the Last Year: Not on file  . Ran Out of Food in the Last Year: Not on file  Transportation Needs:   . Lack of Transportation (Medical): Not on file  . Lack of Transportation (Non-Medical): Not on file  Physical Activity:   . Days of Exercise per Week: Not on file  . Minutes of Exercise per Session: Not on file  Stress:   . Feeling of  Stress : Not on file  Social Connections:   . Frequency of Communication with Friends and Family: Not on file  . Frequency of Social Gatherings with Friends and Family: Not on file  . Attends Religious Services: Not on file  . Active Member of Clubs or Organizations: Not on file  . Attends Archivist Meetings: Not on file  . Marital Status: Not on file  Intimate Partner Violence:   . Fear of Current or Ex-Partner: Not on file  . Emotionally Abused: Not on file  . Physically Abused: Not on file  . Sexually Abused: Not on file    Family History  Problem Relation Age of Onset  . Hypertension Mother   . Stroke Father   . Hypertension Father   . Arthritis Other   . Prostate cancer Other   . Psoriasis Other   . Stroke Other   . Thyroid disease Other   . Anesthesia problems Neg Hx   . Diabetes Neg Hx    . Glaucoma Neg Hx   . Macular degeneration Neg Hx     PHYSICAL EXAM: Vitals:   05/22/20 1056 05/22/20 1100  BP:    Pulse: (!) 51   Resp: 19   Temp:  98.7 F (37.1 C)  SpO2: 94%     No intake or output data in the 24 hours ending 05/22/20 1119  General:  Well appearing. No respiratory difficulty HEENT: normal Neck: supple. no JVD. Carotids 2+ bilat; no bruits. No lymphadenopathy or thryomegaly appreciated. Cor: PMI nondisplaced. Regular rate & rhythm. No rubs, gallops or murmurs. Lungs: clear Abdomen: soft, nontender, nondistended. No hepatosplenomegaly. No bruits or masses. Good bowel sounds. Extremities: no cyanosis, clubbing, rash, edema Neuro: alert & oriented x 3, cranial nerves grossly intact. moves all 4 extremities w/o difficulty. Affect pleasant.  ECG: A. fib with rapid ventricular response rate  Results for orders placed or performed during the hospital encounter of 05/22/20 (from the past 24 hour(s))  Comprehensive metabolic panel     Status: Abnormal   Collection Time: 05/22/20 10:18 AM  Result Value Ref Range   Sodium 140 135 - 145 mmol/L   Potassium 3.5 3.5 - 5.1 mmol/L   Chloride 96 (L) 98 - 111 mmol/L   CO2 29 22 - 32 mmol/L   Glucose, Bld 185 (H) 70 - 99 mg/dL   BUN 20 8 - 23 mg/dL   Creatinine, Ser 0.75 0.44 - 1.00 mg/dL   Calcium 8.4 (L) 8.9 - 10.3 mg/dL   Total Protein 5.5 (L) 6.5 - 8.1 g/dL   Albumin 2.5 (L) 3.5 - 5.0 g/dL   AST 36 15 - 41 U/L   ALT 29 0 - 44 U/L   Alkaline Phosphatase 102 38 - 126 U/L   Total Bilirubin 0.6 0.3 - 1.2 mg/dL   GFR calc non Af Amer >60 >60 mL/min   GFR calc Af Amer >60 >60 mL/min   Anion gap 15 5 - 15  CBC WITH DIFFERENTIAL     Status: Abnormal   Collection Time: 05/22/20 10:18 AM  Result Value Ref Range   WBC 25.1 (H) 4.0 - 10.5 K/uL   RBC 3.39 (L) 3.87 - 5.11 MIL/uL   Hemoglobin 9.4 (L) 12.0 - 15.0 g/dL   HCT 31.2 (L) 36 - 46 %   MCV 92.0 80.0 - 100.0 fL   MCH 27.7 26.0 - 34.0 pg   MCHC 30.1 30.0 - 36.0  g/dL   RDW 15.6 (H) 11.5 -  15.5 %   Platelets 354 150 - 400 K/uL   nRBC 0.0 0.0 - 0.2 %   Neutrophils Relative % 89 %   Neutro Abs 22.4 (H) 1.7 - 7.7 K/uL   Lymphocytes Relative 5 %   Lymphs Abs 1.3 0.7 - 4.0 K/uL   Monocytes Relative 5 %   Monocytes Absolute 1.1 (H) 0 - 1 K/uL   Eosinophils Relative 0 %   Eosinophils Absolute 0.0 0 - 0 K/uL   Basophils Relative 0 %   Basophils Absolute 0.1 0 - 0 K/uL   WBC Morphology MORPHOLOGY UNREMARKABLE    RBC Morphology MORPHOLOGY UNREMARKABLE    Smear Review Normal platelet morphology    Immature Granulocytes 1 %   Abs Immature Granulocytes 0.21 (H) 0.00 - 0.07 K/uL  Protime-INR     Status: None   Collection Time: 05/22/20 10:18 AM  Result Value Ref Range   Prothrombin Time 14.2 11.4 - 15.2 seconds   INR 1.1 0.8 - 1.2  APTT     Status: None   Collection Time: 05/22/20 10:18 AM  Result Value Ref Range   aPTT 31 24 - 36 seconds  Troponin I (High Sensitivity)     Status: Abnormal   Collection Time: 05/22/20 10:18 AM  Result Value Ref Range   Troponin I (High Sensitivity) 39 (H) <18 ng/L  Blood gas, venous     Status: Abnormal (Preliminary result)   Collection Time: 05/22/20 10:19 AM  Result Value Ref Range   pH, Ven 7.31 7.25 - 7.43   pCO2, Ven 65 (H) 44 - 60 mmHg   pO2, Ven PENDING 32 - 45 mmHg   Bicarbonate 32.7 (H) 20.0 - 28.0 mmol/L   Acid-Base Excess 5.2 (H) 0.0 - 2.0 mmol/L   O2 Saturation 36.1 %   Patient temperature 37.0    Collection site VEIN    Sample type VEIN    DG Chest Port 1 View  Result Date: 05/22/2020 CLINICAL DATA:  Shortness of breath. EXAM: PORTABLE CHEST 1 VIEW COMPARISON:  03/23/2020 FINDINGS: Cardiomegaly again noted. Pulmonary vascular congestion, unchanged LEFT interstitial opacity/airspace disease and new RIGHT interstitial/mild airspace opacities are identified, favor edema over infection. Probable small bilateral pleural effusions noted. LEFT LOWER lung consolidation/atelectasis again noted. There is no  evidence of pneumothorax. No acute bony abnormalities are identified. IMPRESSION: 1. Cardiomegaly with pulmonary vascular congestion, LEFT interstitial/airspace disease and new RIGHT interstitial/mild airspace opacities, favor edema over infection. 2. Probable small bilateral pleural effusions. 3. LEFT LOWER lung consolidation/atelectasis. Electronically Signed   By: Margarette Canada M.D.   On: 05/22/2020 10:54     ASSESSMENT AND PLAN: A. fib with rapid ventricular response rate and congestive heart failure on BiPAP with systolic blood pressure 70 and ventricular rate 180.  Since BUN/creatinine is okay advise giving 0.  0.5 mg IV of digoxin and Lasix 20 mg IV every 12 hours.  Discussed the plan with the family which includes 2 daughters and they concur with treatment.  Also will get echocardiogram to evaluate current ejection fraction.  Shamyra Farias A

## 2020-05-22 NOTE — Sepsis Progress Note (Addendum)
Notified provider of need to order 2 liter fluid bolus for lactic acid of 4.3. Dr. Jacqualine Code did not want to order fluids at this time due to decompensated CHF.

## 2020-05-22 NOTE — Consult Note (Signed)
CODE SEPSIS - PHARMACY COMMUNICATION  **Broad Spectrum Antibiotics should be administered within 1 hour of Sepsis diagnosis**  Time Code Sepsis Called/Page Received: 1129  Antibiotics Ordered: cefepime and vancomycin per EDP  Time of 1st antibiotic administration: 1150  Additional action taken by pharmacy: N/A  If necessary, Name of Provider/Nurse Contacted: N/A  Benn Moulder, PharmD Pharmacy Resident  05/22/2020 11:32 AM

## 2020-05-23 ENCOUNTER — Inpatient Hospital Stay
Admit: 2020-05-23 | Discharge: 2020-05-23 | Disposition: A | Discharge status: Home / Self Care | Payer: Medicare Other | Attending: Internal Medicine | Admitting: Internal Medicine

## 2020-05-23 DIAGNOSIS — J9621 Acute and chronic respiratory failure with hypoxia: Secondary | ICD-10-CM | POA: Diagnosis not present

## 2020-05-23 DIAGNOSIS — J9622 Acute and chronic respiratory failure with hypercapnia: Secondary | ICD-10-CM | POA: Diagnosis not present

## 2020-05-23 LAB — BASIC METABOLIC PANEL
Anion gap: 10 (ref 5–15)
BUN: 22 mg/dL (ref 8–23)
CO2: 32 mmol/L (ref 22–32)
Calcium: 7.8 mg/dL — ABNORMAL LOW (ref 8.9–10.3)
Chloride: 99 mmol/L (ref 98–111)
Creatinine, Ser: 0.77 mg/dL (ref 0.44–1.00)
GFR calc Af Amer: >60 mL/min (ref >60)
GFR calc non Af Amer: >60 mL/min (ref >60)
Glucose, Bld: 95 mg/dL (ref 70–99)
Potassium: 2.9 mmol/L — ABNORMAL LOW (ref 3.5–5.1)
Sodium: 141 mmol/L (ref 135–145)

## 2020-05-23 LAB — MAGNESIUM: Magnesium: 1.3 mg/dL — ABNORMAL LOW (ref 1.7–2.4)

## 2020-05-23 LAB — ECHOCARDIOGRAM COMPLETE
AR max vel: 2 cm2
AV Area VTI: 2.14 cm2
AV Area mean vel: 1.87 cm2
AV Mean grad: 5 mmHg
AV Peak grad: 9 mmHg
Ao pk vel: 1.5 m/s
Area-P 1/2: 3.17 cm2
Height: 60 in
S' Lateral: 2.42 cm
Weight: 2512 oz

## 2020-05-23 LAB — CBC
HCT: 25.5 % — ABNORMAL LOW (ref 36.0–46.0)
Hemoglobin: 8.4 g/dL — ABNORMAL LOW (ref 12.0–15.0)
MCH: 28.7 pg (ref 26.0–34.0)
MCHC: 32.9 g/dL (ref 30.0–36.0)
MCV: 87 fL (ref 80.0–100.0)
Platelets: 263 10*3/uL (ref 150–400)
RBC: 2.93 MIL/uL — ABNORMAL LOW (ref 3.87–5.11)
RDW: 15.9 % — ABNORMAL HIGH (ref 11.5–15.5)
WBC: 8.2 10*3/uL (ref 4.0–10.5)
nRBC: 0 % (ref 0.0–0.2)

## 2020-05-23 LAB — LACTIC ACID, PLASMA: Lactic Acid, Venous: 1.9 mmol/L (ref 0.5–1.9)

## 2020-05-23 LAB — STREP PNEUMONIAE URINARY ANTIGEN: Strep Pneumo Urinary Antigen: NEGATIVE

## 2020-05-23 LAB — TROPONIN I (HIGH SENSITIVITY): Troponin I (High Sensitivity): 45 ng/L — ABNORMAL HIGH (ref <18)

## 2020-05-23 LAB — MRSA PCR SCREENING: MRSA by PCR: POSITIVE — AB

## 2020-05-23 LAB — HIV ANTIBODY (ROUTINE TESTING W REFLEX): HIV Screen 4th Generation wRfx: NONREACTIVE

## 2020-05-23 MED ORDER — MUPIROCIN 2 % EX OINT
1.0000 "application " | TOPICAL_OINTMENT | Freq: Two times a day (BID) | CUTANEOUS | Status: AC
  Administered 2020-05-23 – 2020-05-25 (×4): 1 via NASAL
  Filled 2020-05-23 (×2): qty 22

## 2020-05-23 MED ORDER — MAGNESIUM SULFATE 2 GM/50ML IV SOLN
2.0000 g | Freq: Once | INTRAVENOUS | Status: AC
  Administered 2020-05-23: 2 g via INTRAVENOUS
  Filled 2020-05-23: qty 50

## 2020-05-23 MED ORDER — POTASSIUM CHLORIDE CRYS ER 20 MEQ PO TBCR: 40.0000 meq | EXTENDED_RELEASE_TABLET | ORAL | Status: DC

## 2020-05-23 MED ORDER — POTASSIUM CHLORIDE 20 MEQ PO PACK
40.0000 meq | PACK | Freq: Two times a day (BID) | ORAL | Status: DC
  Administered 2020-05-23 – 2020-05-25 (×5): 40 meq via ORAL
  Filled 2020-05-23 (×7): qty 2

## 2020-05-23 MED ORDER — CHLORHEXIDINE GLUCONATE CLOTH 2 % EX PADS
6.0000 | MEDICATED_PAD | Freq: Every day | CUTANEOUS | Status: DC
  Administered 2020-05-26: 6 via TOPICAL
  Filled 2020-05-23 (×2): qty 6

## 2020-05-23 NOTE — Progress Notes (Signed)
SUBJECTIVE: Patient is sleeping with BiPAP   Vitals:   05/23/20 0500 05/23/20 0530 05/23/20 0600 05/23/20 0900  BP: 123/63 (!) 127/58  (!) 146/87  Pulse: 87 95 85 (!) 110  Resp: 17 18 19    Temp:   98.5 F (36.9 C)   TempSrc:   Axillary   SpO2: 100% 100% 100%   Weight:      Height:       No intake or output data in the 24 hours ending 05/23/20 1044  LABS: Basic Metabolic Panel: Recent Labs    05/22/20 1018 05/23/20 0614  NA 140 141  K 3.5 2.9*  CL 96* 99  CO2 29 32  GLUCOSE 185* 95  BUN 20 22  CREATININE 0.75 0.77  CALCIUM 8.4* 7.8*  MG  --  1.3*   Liver Function Tests: Recent Labs    05/22/20 1018  AST 36  ALT 29  ALKPHOS 102  BILITOT 0.6  PROT 5.5*  ALBUMIN 2.5*   No results for input(s): LIPASE, AMYLASE in the last 72 hours. CBC: Recent Labs    05/22/20 1018 05/23/20 0614  WBC 25.1* 8.2  NEUTROABS 22.4*  --   HGB 9.4* 8.4*  HCT 31.2* 25.5*  MCV 92.0 87.0  PLT 354 263   Cardiac Enzymes: No results for input(s): CKTOTAL, CKMB, CKMBINDEX, TROPONINI in the last 72 hours. BNP: Invalid input(s): POCBNP D-Dimer: No results for input(s): DDIMER in the last 72 hours. Hemoglobin A1C: No results for input(s): HGBA1C in the last 72 hours. Fasting Lipid Panel: No results for input(s): CHOL, HDL, LDLCALC, TRIG, CHOLHDL, LDLDIRECT in the last 72 hours. Thyroid Function Tests: No results for input(s): TSH, T4TOTAL, T3FREE, THYROIDAB in the last 72 hours.  Invalid input(s): FREET3 Anemia Panel: No results for input(s): VITAMINB12, FOLATE, FERRITIN, TIBC, IRON, RETICCTPCT in the last 72 hours.   PHYSICAL EXAM General: Well developed, well nourished, in no acute distress HEENT:  Normocephalic and atramatic Neck:  No JVD.  Lungs: Clear bilaterally to auscultation and percussion. Heart: HRRR . Normal S1 and S2 without gallops or murmurs.  Abdomen: Bowel sounds are positive, abdomen soft and non-tender  Msk:  Back normal, normal gait. Normal strength and  tone for age. Extremities: No clubbing, cyanosis or edema.   Neuro: Alert and oriented X 3. Psych:  Good affect, responds appropriately  TELEMETRY: Atrial fibrillation with ventricular rate 110  ASSESSMENT AND PLAN: Atrial fibrillation with rapid ventricular response rate rate which responded to digoxin.  Advise digoxin 0.125 IV daily or use Cardizem since blood pressure is much better this morning.  Principal Problem:   Acute on chronic respiratory failure with hypoxia and hypercapnia (HCC) Active Problems:   Benign essential HTN   GERD without esophagitis   Crohn disease (HCC)   Depression   Atrial fibrillation with RVR (HCC)   Acute on chronic systolic CHF (congestive heart failure) (HCC)   Severe sepsis (HCC)   Elevated troponin   HCAP (healthcare-associated pneumonia)   Normocytic anemia    Luma Clopper A, MD, [facility] 05/23/2020 10:44 AM

## 2020-05-23 NOTE — TOC Initial Note (Signed)
Transition of Care [facility]) - Initial/Assessment Note    Patient Details  Name: Joanne [name] MRN: [id number] Date of Birth: Dec 03, 1930  Transition of Care [facility]) CM/SW Contact:    Ova Freshwater Phone Number: [phone] 05/23/2020, 12:48 PM  Clinical Narrative:                  Patient [facility] to [facility] ED due to SOB from [facility].  Price,Cynthia (Daughter/POA)  [phone], main contact. Pateint is currently living at [facility].  Patient's family very involved, and would like for all three adult children to be in the patient's room, even on the inpatient floor.  CSW explained to them that I was unable to make that decision and that I would relay their request to leadership and include it in my note. Patient needs full assistance with ADLs.  Pending PT/OT assessment for SNF placement.  Expected Discharge Plan: Skilled Nursing Facility Barriers to Discharge: Continued Medical Work up   Patient Goals and CMS Choice Patient states their goals for this hospitalization and ongoing recovery are:: Return to [facility] CMS Medicare.gov Compare Post Acute Care list provided to:: Patient Represenative (must comment) (Price,Cynthia (Daughter) [phone]) Choice offered to / list presented to : Adult Children  Expected Discharge Plan and Services Expected Discharge Plan: [place] In-house Referral: Clinical Social Work     Living arrangements for the past 2 months: [place]                                      Prior Living Arrangements/Services Living arrangements for the past 2 months: [place] Lives with:: Facility Resident Patient language and need for interpreter reviewed:: Yes Do you feel safe going back to the place where you live?: Yes      Need for Family Participation in Patient Care: Yes (Comment) Care giver support system in place?: Yes (comment)   Criminal Activity/Legal Involvement Pertinent to  Current Situation/Hospitalization: No - Comment as needed  Activities of Daily Living      Permission Sought/Granted Permission sought to share information with : Facility Art therapist granted to share information with : Yes, Verbal Permission Granted  Share Information with NAME: Price,Cynthia (Daughter) [phone]  Permission granted to share info w AGENCY: [facility]        Emotional Assessment Appearance:: Appears stated age Attitude/Demeanor/Rapport: Unable to Assess Affect (typically observed): Unable to Assess   Alcohol / Substance Use: Not Applicable Psych Involvement: No (comment)  Admission diagnosis:  Acute on chronic respiratory failure with hypoxia and hypercapnia (HCC) [Z66.06, J96.22] Patient Active Problem List   Diagnosis Date Noted   Severe sepsis ([place]) 05/22/2020   Elevated troponin 05/22/2020   HCAP (healthcare-associated pneumonia) 05/22/2020   Normocytic anemia 05/22/2020   Acute on chronic respiratory failure with hypoxia and hypercapnia (HCC) 05/22/2020   AF (paroxysmal atrial fibrillation) (HCC)    Acute on chronic systolic CHF (congestive heart failure) (HCC)    Atrial fibrillation with RVR (HCC)    Wheeze    Anal fissure    Hemorrhoids    Generalized weakness    Urinary retention    Right arm cellulitis 03/15/2020   Sepsis ([place]) 03/15/2020   Cellulitis of lower extremity 03/15/2020   Crohn disease ([place])    Depression    HTN (hypertension)    Proctitis    Swelling of limb 04/22/2019  Lymphedema 04/22/2019   Peripheral edema 04/07/2019   Hypomagnesemia 03/05/2019   Arthritis of right knee 09/03/2018   GERD without esophagitis 08/17/2018   Spinal stenosis of lumbar region with radiculopathy 08/17/2018   Status post kyphoplasty 08/17/2018   Vertebral compression fracture ([place]) 08/11/2018   Avascular necrosis of bone of hip, left ([place]) 01/06/2018   Chronic anemia 12/23/2017   Crohn's  disease ([place]) 12/23/2017   Fuchs' corneal dystrophy 12/23/2017   Hyperlipidemia, unspecified 12/23/2017   Status post total replacement of hip 12/23/2017   Benign essential HTN 12/16/2017   Shortness of breath 12/02/2017   Protein-calorie malnutrition ([place]) 04/21/2017   Age-related osteoporosis with current pathological fracture with routine healing 04/03/2017   Other dysphagia 01/24/2017   Hypokalemia 01/22/2017   Zenker diverticulum 09/25/2016   History of hypertension 05/03/2015   Hyponatremia 02/21/2015   Nuclear sclerotic cataract of right eye 09/08/2012   Right shoulder injury 05/27/2002   PCP:  Tracie Harrier, MD Pharmacy:   [place], [place] - [address] [place] [address] Phone: [phone] Fax: [phone]  [facility] ([place] [place] [address] Phone: [phone] Fax: [phone]     Social Determinants of Health (SDOH) Interventions    Readmission Risk Interventions No flowsheet data found.

## 2020-05-23 NOTE — ED Notes (Signed)
Pt given partial bed bath, cleaned on incontinent stool and urine. Purewick removed d/t incontinent BMs. Warm blanket provided.

## 2020-05-23 NOTE — Progress Notes (Signed)
PROGRESS NOTE    Joanne [name]  RSW:[id number] DOB: 21-Feb-1931 DOA: 05/22/2020 PCP: Tracie Harrier, MD   Brief Narrative: Taken from H&P. Joanne [name] is a 86 y.o. female with medical history significant of hypertension, hyperlipidemia, GERD, depression,Fuch'sendothelial dystrophy, Crohn's disease, anemia, Zenker's diverticulum, a fib on Eliquis, sCHF, anemia, who presents with SOB, initially requiring BiPAP.  Now off the BiPAP and saturating well on her baseline oxygen of 3 to 4 L. Patient was found to be in A. fib with RVR and hypotensive. She is Dr. Bethanne Ginger patient, and Dr. Chancy Milroy who was covering [facility] cardiology was consulted and advised to give her digoxin. Patient received 500 cc bolus in ED with improvement in her blood pressure. On arrival she had leukocytosis at 25,BNP 1598, lactic acid 4.3, 3.8, INR 1.01, PTT 31, troponin 39, negative Covid PCR, electrolytes renal function okay, temperature normal, tachypnea with RR 29.  Procalcitonin of 1.48. Chest x-ray with cardiomegaly and pulmonary vascular congestion.LEFT interstitial/airspace disease and new RIGHT interstitial/mild airspace opacities, favor edema over infection. 2. Probable small bilateral pleural effusions. 3. LEFT LOWER lung consolidation/atelectasis.  She was started on antibiotics for a possible pneumonia and gentle diuresis.  Subjective: Patient was feeling little better when seen today.  2 daughters and 1 son was in the room.  All of their questions answered.  Heart rate remained between 100-1 20 fluctuating while I was in the room with A. fib.  Assessment & Plan:   Principal Problem:   Acute on chronic respiratory failure with hypoxia and hypercapnia (HCC) Active Problems:   Benign essential HTN   GERD without esophagitis   Crohn disease (HCC)   Depression   Atrial fibrillation with RVR (HCC)   Acute on chronic systolic CHF (congestive heart failure) (HCC)   Severe sepsis (HCC)   Elevated troponin    HCAP (healthcare-associated pneumonia)   Normocytic anemia  Acute on chronic respiratory failure with hypoxia and hypercapnia due to acute on chronic systolic CHF exacerbation and possible HCAP. Patient appears clinically volume up with 1% lower extremity edema and significant edema surrounding her buttock area. Patient is incontinent and uses diaper-unable to calculate output. Clinically seems improving as she was saturating well on her baseline oxygen of 3 to 4 L per daughter.Echocardiogram with EF of 40 to 45% which is an improvement from her prior echo done in July 2021 where EF was 30 to 35%, global hypokinesis and IVC with more than 50% collapsibility. She was started on cefepime and vancomycin. -Continue broad-spectrum antibiotics for now-might have lower extremity cellulitis. -Follow-up blood and urinary cultures. -Continue supplemental oxygen to keep the saturation above 90%. -Continue with Lasix 20 mg IV twice daily. -Daily BMP. -Daily weights. -Obtain REDs Vest  Reading. -Repeat chest x-ray tomorrow morning.  Severe sepsis due to possible HCAP (healthcare-associated pneumonia): Patient meets criteria for severe sepsis with leukocytosis, tachycardia, tachypnea with heart rate of 170, also has hypotension.  Lactic acid is elevated 4.3 -->3.8.  Patient has received 500 cc of normal saline, will not give more IV fluid due to fluid overload.  Leukocytosis resolved.  Lactic acidosis resolved.  Blood cultures negative in 12-hour, urine cultures pending UA with pyuria.  Procalcitonin at 1.48. -Continue with cefepime and vancomycin. -Follow-up with blood and urine cultures.  Lower extremity cellulitis.  Lower extremities with edema and erythema, left more than right.  Concerning for cellulitis.  No hyperthermia. -Patient is already on antibiotics. -Continue to monitor.  Hypokalemia.  Potassium of 2.9 with magnesium of  1.3. -Replete electrolytes and monitor  Elevated troponin.  No  chest pain.  Peaked at 41, not trending down.  Most likely secondary to demand ischemia.  A. fib with RVR.  Dr. Humphrey Rolls was consulted over the weekend and they advised giving her a dose of digoxin. Heart rate improved although remained little over 100. -Continue with Cardizem and metoprolol. -Continue with Eliquis -Continue to monitor.  Essential hypertension.  Blood pressure within goal today. -Continue IV Lasix along with Cardizem and metoprolol. -Continue to monitor  GERD without esophagitis -Continue Protonix  Crohn disease (HCC) -Continue budesonide -As needed Imodium  Depression -Continue home medications  Normocytic anemia: Hgb 9.4. -Continue home dose of iron supplement.  Objective: Vitals:   05/23/20 0600 05/23/20 0900 05/23/20 1030 05/23/20 1100  BP:  (!) 146/87 (!) 126/52   Pulse: 85 (!) 110 90   Resp: 19  (!) 23   Temp: 98.5 F (36.9 C)     TempSrc: Axillary     SpO2: 100%  100% 100%  Weight:      Height:       No intake or output data in the 24 hours ending 05/23/20 1439 Filed Weights   05/22/20 1012  Weight: 71.2 kg    Examination:  General exam: Well-developed elderly lady, appears calm and comfortable  Respiratory system: Decreased breath sound at bases. Respiratory effort normal. Cardiovascular system: Irregularly irregular Gastrointestinal system: Soft, nontender, nondistended, bowel sounds positive. Central nervous system: Alert and oriented. No focal neurological deficits. Extremities: 1+ LE edema, 2+ edema around buttock area, no cyanosis, pulses intact and symmetrical. Skin: Lower extremity erythema, left more than right. Psychiatry: Judgement and insight appear normal.    DVT prophylaxis: Eliquis Code Status: DNR Family Communication: 2 daughters and son were updated at bedside.  Multiple questions which were answered. Disposition Plan:  Status is: Inpatient  Remains inpatient appropriate because:Inpatient level of care appropriate  due to severity of illness   Dispo: The patient is from: SNF              Anticipated d/c is to: SNF              Anticipated d/c date is: 2 days              Patient currently is not medically stable to d/c.  Consultants:   Cardiology  Procedures:  Antimicrobials:  Cefepime Vancomycin.  Data Reviewed: I have personally reviewed following labs and imaging studies  CBC: Recent Labs  Lab 05/22/20 1018 05/23/20 0614  WBC 25.1* 8.2  NEUTROABS 22.4*  --   HGB 9.4* 8.4*  HCT 31.2* 25.5*  MCV 92.0 87.0  PLT 354 297   Basic Metabolic Panel: Recent Labs  Lab 05/22/20 1018 05/23/20 0614  NA 140 141  K 3.5 2.9*  CL 96* 99  CO2 29 32  GLUCOSE 185* 95  BUN 20 22  CREATININE 0.75 0.77  CALCIUM 8.4* 7.8*  MG  --  1.3*   GFR: Estimated Creatinine Clearance: 44.5 mL/min (by C-G formula based on SCr of 0.77 mg/dL). Liver Function Tests: Recent Labs  Lab 05/22/20 1018  AST 36  ALT 29  ALKPHOS 102  BILITOT 0.6  PROT 5.5*  ALBUMIN 2.5*   No results for input(s): LIPASE, AMYLASE in the last 168 hours. No results for input(s): AMMONIA in the last 168 hours. Coagulation Profile: Recent Labs  Lab 05/22/20 1018  INR 1.1   Cardiac Enzymes: No results for input(s): CKTOTAL, CKMB, CKMBINDEX, TROPONINI  in the last 168 hours. BNP (last 3 results) No results for input(s): PROBNP in the last 8760 hours. HbA1C: No results for input(s): HGBA1C in the last 72 hours. CBG: No results for input(s): GLUCAP in the last 168 hours. Lipid Profile: No results for input(s): CHOL, HDL, LDLCALC, TRIG, CHOLHDL, LDLDIRECT in the last 72 hours. Thyroid Function Tests: No results for input(s): TSH, T4TOTAL, FREET4, T3FREE, THYROIDAB in the last 72 hours. Anemia Panel: No results for input(s): VITAMINB12, FOLATE, FERRITIN, TIBC, IRON, RETICCTPCT in the last 72 hours. Sepsis Labs: Recent Labs  Lab 05/22/20 1018 05/22/20 1111 05/22/20 1435 05/23/20 0941  PROCALCITON  --   --  1.48   --   LATICACIDVEN 4.3* 3.8*  --  1.9    Recent Results (from the past 240 hour(s))  Blood culture (single)     Status: None (Preliminary result)   Collection Time: 05/22/20 10:19 AM   Specimen: BLOOD  Result Value Ref Range Status   Specimen Description BLOOD LEFT ARM  Final   Special Requests   Final    BOTTLES DRAWN AEROBIC AND ANAEROBIC Blood Culture adequate volume   Culture   Final    NO GROWTH < 24 HOURS Performed at [facility], [address], [place], [place] [address]    Report Status PENDING  Incomplete  Respiratory Panel by RT PCR (Flu A&B, Covid) - Nasopharyngeal Swab     Status: None   Collection Time: 05/22/20 10:19 AM   Specimen: Nasopharyngeal Swab  Result Value Ref Range Status   SARS Coronavirus 2 by RT PCR NEGATIVE NEGATIVE Final    Comment: (NOTE) SARS-CoV-2 target nucleic acids are NOT DETECTED.  The SARS-CoV-2 RNA is generally detectable in upper respiratoy specimens during the acute phase of infection. The lowest concentration of SARS-CoV-2 viral copies this assay can detect is 131 copies/mL. A negative result does not preclude SARS-Cov-2 infection and should not be used as the sole basis for treatment or other patient management decisions. A negative result may occur with  improper specimen collection/handling, submission of specimen other than nasopharyngeal swab, presence of viral mutation(s) within the areas targeted by this assay, and inadequate number of viral copies (<131 copies/mL). A negative result must be combined with clinical observations, patient history, and epidemiological information. The expected result is Negative.  Fact Sheet for Patients:  PinkCheek.be  Fact Sheet for Healthcare Providers:  GravelBags.it  This test is no t yet approved or cleared by the [place] FDA and  has been authorized for detection and/or diagnosis of SARS-CoV-2 by FDA under an  Emergency Use Authorization (EUA). This EUA will remain  in effect (meaning this test can be used) for the duration of the COVID-19 declaration under Section 564(b)(1) of the Act, 21 U.S.C. section 360bbb-3(b)(1), unless the authorization is terminated or revoked sooner.     Influenza A by PCR NEGATIVE NEGATIVE Final   Influenza B by PCR NEGATIVE NEGATIVE Final    Comment: (NOTE) The Xpert Xpress SARS-CoV-2/FLU/RSV assay is intended as an aid in  the diagnosis of influenza from Nasopharyngeal swab specimens and  should not be used as a sole basis for treatment. Nasal washings and  aspirates are unacceptable for Xpert Xpress SARS-CoV-2/FLU/RSV  testing.  Fact Sheet for Patients: PinkCheek.be  Fact Sheet for Healthcare Providers: GravelBags.it  This test is not yet approved or cleared by the [place] FDA and  has been authorized for detection and/or diagnosis of SARS-CoV-2 by  FDA under an Emergency Use  Authorization (EUA). This EUA will remain  in effect (meaning this test can be used) for the duration of the  Covid-19 declaration under Section 564(b)(1) of the Act, 21  U.S.C. section 360bbb-3(b)(1), unless the authorization is  terminated or revoked. Performed at [facility], [address] [place], [place] [address]   Urine culture     Status: None (Preliminary result)   Collection Time: 05/22/20  4:18 PM   Specimen: Urine, Random  Result Value Ref Range Status   Specimen Description   Final    URINE, RANDOM Performed at [facility], [address] [place], [place] [address]    Special Requests   Final    NONE Performed at [facility], [address], [place], [place] [address]    Culture   Final    CULTURE REINCUBATED FOR BETTER GROWTH Performed at [facility], [place] [address], [place], [place] [address]    Report Status PENDING  Incomplete  MRSA PCR Screening      Status: Abnormal   Collection Time: 05/23/20  8:16 AM   Specimen: Nasopharyngeal  Result Value Ref Range Status   MRSA by PCR POSITIVE (A) NEGATIVE Final    Comment:        The GeneXpert MRSA Assay (FDA approved for NASAL specimens only), is one component of a comprehensive MRSA colonization surveillance program. It is not intended to diagnose MRSA infection nor to guide or monitor treatment for MRSA infections. RESULT CALLED TO, READ BACK BY AND VERIFIED WITH: ANNIE SMITH AT 7510 ON 05/23/2020 [place]. Performed at [facility], [address] [place], [place] [address]      Radiology Studies: [facility] Chest [place] 1 View  Result Date: 05/22/2020 CLINICAL DATA:  Shortness of breath. EXAM: PORTABLE CHEST 1 VIEW COMPARISON:  03/23/2020 FINDINGS: Cardiomegaly again noted. Pulmonary vascular congestion, unchanged LEFT interstitial opacity/airspace disease and new RIGHT interstitial/mild airspace opacities are identified, favor edema over infection. Probable small bilateral pleural effusions noted. LEFT LOWER lung consolidation/atelectasis again noted. There is no evidence of pneumothorax. No acute bony abnormalities are identified. IMPRESSION: 1. Cardiomegaly with pulmonary vascular congestion, LEFT interstitial/airspace disease and new RIGHT interstitial/mild airspace opacities, favor edema over infection. 2. Probable small bilateral pleural effusions. 3. LEFT LOWER lung consolidation/atelectasis. Electronically Signed   By: Margarette Canada M.D.   On: 05/22/2020 10:54   ECHOCARDIOGRAM COMPLETE  Result Date: 05/23/2020    ECHOCARDIOGRAM REPORT   Patient Name:   ANASOFIA MICALLEF Date of Exam: 05/23/2020 Medical Rec #:  [id number]     Height:       60.0 in Accession #:    [id number]    Weight:       157.0 lb Date of Birth:  11-19-1930    BSA:          1.684 m Patient Age:    39 years      BP:           127/58 mmHg Patient Gender: F             HR:           107 bpm. Exam Location:  [facility] Procedure: 2D  Echo Indications:     Atrial Fibrillation 427.31  History:         Patient has prior history of Echocardiogram examinations. CHF,                  Pleural Effusion; Risk Factors:Hypertension and Dyslipidemia.  Sonographer:  L Thornton-Maynard Referring Phys:  [id number] Soledad Gerlach NIU Diagnosing Phys: Neoma Laming MD IMPRESSIONS  1. Left ventricular ejection fraction, by estimation, is 40 to 45%. The left ventricle has mildly decreased function. The left ventricle demonstrates global hypokinesis. The left ventricular internal cavity size was mildly to moderately dilated. There is moderate concentric left ventricular hypertrophy. Left ventricular diastolic parameters are indeterminate.  2. Right ventricular systolic function is normal. The right ventricular size is normal. There is mildly elevated pulmonary artery systolic pressure.  3. Left atrial size was mildly dilated.  4. Right atrial size was mild to moderately dilated.  5. The mitral valve is normal in structure. Moderate mitral valve regurgitation. No evidence of mitral stenosis.  6. The aortic valve is normal in structure. Aortic valve regurgitation is trivial. No aortic stenosis is present.  7. The inferior vena cava is normal in size with greater than 50% respiratory variability, suggesting right atrial pressure of 3 mmHg. FINDINGS  Left Ventricle: Left ventricular ejection fraction, by estimation, is 40 to 45%. The left ventricle has mildly decreased function. The left ventricle demonstrates global hypokinesis. The left ventricular internal cavity size was mildly to moderately dilated. There is moderate concentric left ventricular hypertrophy. Left ventricular diastolic parameters are indeterminate. Right Ventricle: The right ventricular size is normal. No increase in right ventricular wall thickness. Right ventricular systolic function is normal. There is mildly elevated pulmonary artery systolic pressure. The tricuspid regurgitant velocity is 2.88  m/s, and  with an assumed right atrial pressure of 10 mmHg, the estimated right ventricular systolic pressure is 88.4 mmHg. Left Atrium: Left atrial size was mildly dilated. Right Atrium: Right atrial size was mild to moderately dilated. Pericardium: There is no evidence of pericardial effusion. Mitral Valve: The mitral valve is normal in structure. Moderate mitral valve regurgitation. No evidence of mitral valve stenosis. MV peak gradient, 7.6 mmHg. The mean mitral valve gradient is 4.0 mmHg. Tricuspid Valve: The tricuspid valve is normal in structure. Tricuspid valve regurgitation is mild . No evidence of tricuspid stenosis. Aortic Valve: The aortic valve is normal in structure. Aortic valve regurgitation is trivial. No aortic stenosis is present. Aortic valve mean gradient measures 5.0 mmHg. Aortic valve peak gradient measures 9.0 mmHg. Aortic valve area, by VTI measures 2.14 cm. Pulmonic Valve: The pulmonic valve was normal in structure. Pulmonic valve regurgitation is mild. No evidence of pulmonic stenosis. Aorta: The aortic root is normal in size and structure. Venous: The inferior vena cava is normal in size with greater than 50% respiratory variability, suggesting right atrial pressure of 3 mmHg. IAS/Shunts: No atrial level shunt detected by color flow Doppler.  LEFT VENTRICLE PLAX 2D LVIDd:         3.15 cm  Diastology LVIDs:         2.42 cm  LV e' medial:    6.96 cm/s LV PW:         1.38 cm  LV E/e' medial:  17.5 LV IVS:        1.30 cm  LV e' lateral:   6.20 cm/s LVOT diam:     2.10 cm  LV E/e' lateral: 19.7 LV SV:         54 LV SV Index:   32 LVOT Area:     3.46 cm  RIGHT VENTRICLE RV S prime:     13.80 cm/s TAPSE (M-mode): 1.6 cm LEFT ATRIUM             Index LA diam:  4.10 cm 2.43 cm/m LA Vol (A2C):   49.6 ml 29.45 ml/m LA Vol (A4C):   58.4 ml 34.68 ml/m LA Biplane Vol: 53.9 ml 32.00 ml/m  AORTIC VALVE                    PULMONIC VALVE AV Area (Vmax):    2.00 cm     PV Vmax:       1.14 m/s AV Area  (Vmean):   1.87 cm     PV Peak grad:  5.2 mmHg AV Area (VTI):     2.14 cm AV Vmax:           150.00 cm/s AV Vmean:          109.000 cm/s AV VTI:            0.254 m AV Peak Grad:      9.0 mmHg AV Mean Grad:      5.0 mmHg LVOT Vmax:         86.50 cm/s LVOT Vmean:        58.700 cm/s LVOT VTI:          0.157 m LVOT/AV VTI ratio: 0.62  AORTA Ao Root diam: 3.40 cm MITRAL VALVE                TRICUSPID VALVE MV Area (PHT): 3.17 cm     TR Peak grad:   33.2 mmHg MV Peak grad:  7.6 mmHg     TR Vmax:        288.00 cm/s MV Mean grad:  4.0 mmHg MV Vmax:       1.38 m/s     SHUNTS MV Vmean:      98.8 cm/s    Systemic VTI:  0.16 m MV Decel Time: 239 msec     Systemic Diam: 2.10 cm MV E velocity: 122.00 cm/s Neoma Laming MD Electronically signed by Neoma Laming MD Signature Date/Time: 05/23/2020/2:35:54 PM    Final     Scheduled Meds: . apixaban  5 mg Oral BID  . ascorbic acid  500 mg Oral Daily  . budesonide  9 mg Oral Daily  . calcium-vitamin D  1 tablet Oral TID  . [START ON 05/24/2020] Chlorhexidine Gluconate Cloth  6 each Topical Q0600  . cholecalciferol  3,000 Units Oral Daily  . colestipol  1 g Oral BID  . diltiazem  120 mg Oral Daily  . DULoxetine  20 mg Oral Daily  . ferrous sulfate  325 mg Oral Q breakfast  . furosemide  20 mg Intravenous BID  . gabapentin  300 mg Oral TID  . hydrocortisone  1 application Topical TID  . ipratropium-albuterol  3 mL Nebulization Q4H  . liver oil-zinc oxide   Topical BID  . loperamide  2 mg Oral BID  . magnesium oxide  400 mg Oral BID  . metoprolol tartrate  25 mg Oral Daily  . multivitamin with minerals  1 tablet Oral Daily  . mupirocin ointment  1 application Nasal BID  . pantoprazole  40 mg Oral Daily  . saccharomyces boulardii  250 mg Oral Daily  . zinc sulfate  220 mg Oral Daily   Continuous Infusions: . ceFEPime (MAXIPIME) IV Stopped (05/23/20 1049)  . vancomycin Stopped (05/23/20 0005)     LOS: 1 day   Time spent: 40 minutes.  Lorella Nimrod,  MD [facility]  If 7PM-7AM, please contact night-coverage [email or web address]  05/23/2020, 2:39 PM   This record has been  created using Systems analyst. Errors have been sought and corrected,but may not always be located. Such creation errors do not reflect on the standard of care.

## 2020-05-24 ENCOUNTER — Inpatient Hospital Stay: Payer: Medicare Other

## 2020-05-24 DIAGNOSIS — J9622 Acute and chronic respiratory failure with hypercapnia: Secondary | ICD-10-CM | POA: Diagnosis not present

## 2020-05-24 DIAGNOSIS — Z515 Encounter for palliative care: Secondary | ICD-10-CM

## 2020-05-24 DIAGNOSIS — Z66 Do not resuscitate: Secondary | ICD-10-CM

## 2020-05-24 DIAGNOSIS — I509 Heart failure, unspecified: Secondary | ICD-10-CM

## 2020-05-24 DIAGNOSIS — Z7189 Other specified counseling: Secondary | ICD-10-CM

## 2020-05-24 DIAGNOSIS — R6521 Severe sepsis with septic shock: Secondary | ICD-10-CM

## 2020-05-24 DIAGNOSIS — K5090 Crohn's disease, unspecified, without complications: Secondary | ICD-10-CM | POA: Diagnosis not present

## 2020-05-24 DIAGNOSIS — I4891 Unspecified atrial fibrillation: Secondary | ICD-10-CM | POA: Diagnosis not present

## 2020-05-24 DIAGNOSIS — A419 Sepsis, unspecified organism: Secondary | ICD-10-CM | POA: Diagnosis not present

## 2020-05-24 DIAGNOSIS — J9621 Acute and chronic respiratory failure with hypoxia: Secondary | ICD-10-CM | POA: Diagnosis not present

## 2020-05-24 LAB — BASIC METABOLIC PANEL
Anion gap: 9 (ref 5–15)
BUN: 15 mg/dL (ref 8–23)
CO2: 31 mmol/L (ref 22–32)
Calcium: 7.5 mg/dL — ABNORMAL LOW (ref 8.9–10.3)
Chloride: 101 mmol/L (ref 98–111)
Creatinine, Ser: 0.49 mg/dL (ref 0.44–1.00)
GFR calc Af Amer: >60 mL/min (ref >60)
GFR calc non Af Amer: >60 mL/min (ref >60)
Glucose, Bld: 89 mg/dL (ref 70–99)
Potassium: 2.3 mmol/L — CL (ref 3.5–5.1)
Sodium: 141 mmol/L (ref 135–145)

## 2020-05-24 LAB — BLOOD GAS, VENOUS
Acid-Base Excess: 5.2 mmol/L — ABNORMAL HIGH (ref 0.0–2.0)
Bicarbonate: 32.7 mmol/L — ABNORMAL HIGH (ref 20.0–28.0)
O2 Saturation: 36.1 %
Patient temperature: 37
pCO2, Ven: 65 mmHg — ABNORMAL HIGH (ref 44.0–60.0)
pH, Ven: 7.31 (ref 7.250–7.430)

## 2020-05-24 LAB — CBC
HCT: 26.4 % — ABNORMAL LOW (ref 36.0–46.0)
Hemoglobin: 8.1 g/dL — ABNORMAL LOW (ref 12.0–15.0)
MCH: 27.3 pg (ref 26.0–34.0)
MCHC: 30.7 g/dL (ref 30.0–36.0)
MCV: 88.9 fL (ref 80.0–100.0)
Platelets: 266 10*3/uL (ref 150–400)
RBC: 2.97 MIL/uL — ABNORMAL LOW (ref 3.87–5.11)
RDW: 15.4 % (ref 11.5–15.5)
WBC: 7.5 10*3/uL (ref 4.0–10.5)
nRBC: 0 % (ref 0.0–0.2)

## 2020-05-24 LAB — URINE CULTURE: Culture: 20000 — AB

## 2020-05-24 LAB — EXPECTORATED SPUTUM ASSESSMENT W GRAM STAIN, RFLX TO RESP C

## 2020-05-24 LAB — LEGIONELLA PNEUMOPHILA SEROGP 1 UR AG: L. pneumophila Serogp 1 Ur Ag: NEGATIVE

## 2020-05-24 LAB — MAGNESIUM: Magnesium: 1.6 mg/dL — ABNORMAL LOW (ref 1.7–2.4)

## 2020-05-24 MED ORDER — METOPROLOL TARTRATE 50 MG PO TABS
50.0000 mg | ORAL_TABLET | Freq: Every day | ORAL | Status: DC
  Administered 2020-05-24 – 2020-05-25 (×2): 50 mg via ORAL
  Filled 2020-05-24 (×2): qty 1

## 2020-05-24 MED ORDER — FUROSEMIDE 10 MG/ML IJ SOLN
40.0000 mg | Freq: Two times a day (BID) | INTRAMUSCULAR | Status: DC
  Administered 2020-05-24 – 2020-05-26 (×4): 40 mg via INTRAVENOUS
  Filled 2020-05-24 (×4): qty 4

## 2020-05-24 MED ORDER — POTASSIUM CHLORIDE CRYS ER 20 MEQ PO TBCR
40.0000 meq | EXTENDED_RELEASE_TABLET | ORAL | Status: AC
  Administered 2020-05-24 (×2): 40 meq via ORAL
  Filled 2020-05-24 (×2): qty 2

## 2020-05-24 MED ORDER — MAGNESIUM SULFATE 2 GM/50ML IV SOLN
2.0000 g | Freq: Once | INTRAVENOUS | Status: AC
  Administered 2020-05-24: 2 g via INTRAVENOUS
  Filled 2020-05-24: qty 50

## 2020-05-24 NOTE — ED Notes (Signed)
Cardiology at bedside.

## 2020-05-24 NOTE — ED Notes (Signed)
Provider at bedside

## 2020-05-24 NOTE — Consult Note (Signed)
Consultation Note Date: 05/24/2020   Patient Name: Joanne [name]  DOB: 12/04/1930  MRN: [id number]  Age / Sex: 86 y.o., female   PCP: Joanne Harrier, MD Referring Physician: Lorella Nimrod, MD   REASON FOR CONSULTATION:Establishing goals of care  Palliative Care consult requested for goals of care discussion in this 86 y.o. female with multiple medical problems including hypertension, hyperlipidemia, GERD, depression,Fuch'sendothelial dystrophy, Crohn's disease, anemia, Zenker's diverticulum, a-fib (Eliquis), home oxygen 3L,  sCHF, and anemia.  Patient presented to the ED from [facility] facility with complaints of increased shortness of breath.  It was reported by daughters that patient was receiving antibiotic treatment for possible pneumonia at facility.  During ED work-up patient was found to have atrial fibrillation with RVR (heart rates 1 17-915) with systolic blood pressure in 70s.  WBC 25.  BNP 1598.  Lactic acid 4.3.  Covid PCR negative.  X-ray showed cardiomegaly with pulmonary vascular congestion, left interstitial airspace disease with new right opacities favoring edema over infection.  Left lower lung consolidation. Cardiology is currently following patient with recommendations to continue with IV diuresis, Cardizem, and metoprolol for rate control.  Clinical Assessment and Goals of Care: I have reviewed medical records including lab results, imaging, Epic notes, and MAR, received report from the bedside RN, and assessed the patient. I met at the bedside with patient and her daughter Joanne [name] to discuss diagnosis prognosis, [place], EOL wishes, disposition and options.  Joanne [name] is awake, alert and oriented x3. She complains of generalized pain with more focus on her sacral area.  I introduced Palliative Medicine as specialized medical care for people living with serious illness. It focuses on providing relief from the symptoms and stress of a serious illness. The  goal is to improve quality of life for both the patient and the family.  Patient and daughter verbalized understanding and appreciation.  We discussed a brief life review of the patient, along with her functional and nutritional status.  Patient is widowed with 3 children who are her biggest support system.  Patient has been a rehab resident at [facility] facility since her previous hospitalization in July.  Prior to rehabilitation patient resided in the home with private caregivers and family support.  Family reports patient's baseline is bedbound/wheelchair bound due to the inability to ambulate.  Patient states "things just aren't working right!" Joanne [name] confirms sharing prior to admission patient was working with PT at facility but limited in her interactions with inability to stand. Her appetite was good but with some decrease over the past several months. Patient generally takes pills with applesauce due to Zenker's diagnosis.   We discussed Her current illness and what it means in the larger context of Her on-going co-morbidities. With specific discussions regarding her recent atrial fibrillation, shortness of breath, pneumonia, and overall functional status. Natural disease trajectory and expectations at EOL were discussed.  Daughter verbalized understanding of patient's current illness and co-morbidities.   I attempted to elicit values and goals of care important to the patient.    Family is clear in expressed goals for patient to continue to receive all necessary treatments as they are remaining hopeful for improvement. Daughter expressed hopes patient could continue to work with therapy and hopefully regain some strength. Education and discussion regarding patient's current baseline and with each illness developing a new baseline with decreased chances after each illness to make a full recovery. Daughter verbalized understanding and appreciation.   Patient has a documented advanced  directive  which has been reviewed in [place]. Daughters Joanne [name] and Joanne [name] are documented HCPOA. Patient's designated wishes are fo no life-sustaining measures, to be kept comfortable, organ donation, and she has indicated her health care agents may not go against her decisions. DNR/DNI confirmed.   Questions and concerns were addressed.  The family was encouraged to call with questions or concerns.  PMT will continue to support holistically.   SOCIAL HISTORY:     reports that she quit smoking about 29 years ago. Her smoking use included cigarettes. She has a 17.50 pack-year smoking history. She has never used smokeless tobacco. She reports previous alcohol use. She reports that she does not use drugs.  CODE STATUS: DNR  ADVANCE DIRECTIVES: Primary Decision Maker: Joanne [name] and Joanne [name] (POA/daughters)   SYMPTOM MANAGEMENT: per attending   Palliative Prophylaxis:   Aspiration, Bowel Regimen, Frequent Pain Assessment and Oral Care  PSYCHO-SOCIAL/SPIRITUAL:  Support System: Family  Desire for further Chaplaincy support: NO   Additional Recommendations (Limitations, Scope, Preferences):  treat the treatable, no escalation of care, no life-sustaining measures   Education on hospice/palliative    PAST MEDICAL HISTORY: Past Medical History:  Diagnosis Date  . Anemia, unspecified    B12 and iron deficiency   . Arthritis   . Atrial fibrillation ([place])   . B12 deficiency   . Collagen vascular disease ([place])   . Crohn disease ([place])   . Crohn's disease ([place])    followed by Dr. Vira [name] with serial colonoscopies  . Dyspnea   . Fuchs' endothelial dystrophy   . GERD (gastroesophageal reflux disease)   . History of bone density study 09/01/2012  . History of spinal fracture   . Hyperlipidemia, unspecified   . Hypertension   . Hypokalemia   . Hypomagnesemia   . Osteoporosis   . Other dysphagia 01/24/2017  . Pneumonia    1980's  . Protein calorie malnutrition ([place])   . Retinal  detachment   . Right shoulder injury 05/2002   treated conservatively  . Trigger finger   . Zenker diverticulum 09/25/2016    ALLERGIES:  is allergic to oxycodone.   MEDICATIONS:  Current Facility-Administered Medications  Medication Dose Route Frequency Provider Last Rate Last Admin  . acetaminophen (TYLENOL) tablet 650 mg  650 mg Oral Q6H PRN Ivor Costa, MD      . albuterol (PROVENTIL) (2.5 MG/3ML) 0.083% nebulizer solution 2.5 mg  2.5 mg Nebulization Q4H PRN Ivor Costa, MD      . apixaban Arne Cleveland) tablet 5 mg  5 mg Oral BID Ivor Costa, MD   5 mg at 05/24/20 0919  . ascorbic acid (VITAMIN C) tablet 500 mg  500 mg Oral Daily Ivor Costa, MD   500 mg at 05/24/20 0914  . budesonide (ENTOCORT EC) 24 hr capsule 9 mg  9 mg Oral Daily Ivor Costa, MD   9 mg at 05/24/20 7035  . calcium-vitamin D (OSCAL WITH D) 500-200 MG-UNIT per tablet 1 tablet  1 tablet Oral TID Ivor Costa, MD   1 tablet at 05/24/20 0927  . ceFEPIme (MAXIPIME) 2 g in sodium chloride 0.9 % 100 mL IVPB  2 g Intravenous Q12H Benn Moulder, RPH   Stopped at 05/24/20 1005  . Chlorhexidine Gluconate Cloth 2 % PADS 6 each  6 each Topical Q0600 Joanne Nimrod, MD      . cholecalciferol (VITAMIN D) tablet 3,000 Units  3,000 Units Oral Daily Ivor Costa, MD   3,000 Units at 05/24/20 0913  .  colestipol (COLESTID) tablet 1 g  1 g Oral BID Ivor Costa, MD   1 g at 05/24/20 0919  . dextromethorphan-guaiFENesin (MUCINEX DM) 30-600 MG per 12 hr tablet 1 tablet  1 tablet Oral BID PRN Ivor Costa, MD      . diltiazem (CARDIZEM CD) 24 hr capsule 120 mg  120 mg Oral Daily Ivor Costa, MD   120 mg at 05/24/20 0927  . DULoxetine (CYMBALTA) DR capsule 20 mg  20 mg Oral Daily Ivor Costa, MD   20 mg at 05/24/20 0938  . ferrous sulfate tablet 325 mg  325 mg Oral Q breakfast Ivor Costa, MD   325 mg at 05/24/20 0913  . furosemide (LASIX) injection 40 mg  40 mg Intravenous BID Joanne Nimrod, MD      . gabapentin (NEURONTIN) capsule 300 mg  300 mg Oral  TID Ivor Costa, MD   300 mg at 05/24/20 1829  . hydrocortisone 1 % ointment 1 application  1 application Topical TID Ivor Costa, MD   1 application at 93/71/69 2200  . ipratropium-albuterol (DUONEB) 0.5-2.5 (3) MG/3ML nebulizer solution 3 mL  3 mL Nebulization Q4H Ivor Costa, MD   3 mL at 05/24/20 0848  . liver oil-zinc oxide (DESITIN) 40 % ointment   Topical BID Ivor Costa, MD   Given at 05/23/20 2200  . loperamide (IMODIUM) capsule 2 mg  2 mg Oral BID Ivor Costa, MD   2 mg at 05/24/20 0914  . magnesium oxide (MAG-OX) tablet 400 mg  400 mg Oral BID Ivor Costa, MD   400 mg at 05/24/20 0919  . metoprolol tartrate (LOPRESSOR) tablet 50 mg  50 mg Oral Daily Joanne Nimrod, MD   50 mg at 05/24/20 0914  . multivitamin with minerals tablet 1 tablet  1 tablet Oral Daily Ivor Costa, MD   1 tablet at 05/24/20 0919  . mupirocin ointment (BACTROBAN) 2 % 1 application  1 application Nasal BID Joanne Nimrod, MD   1 application at 67/89/38 2200  . ondansetron (ZOFRAN) injection 4 mg  4 mg Intravenous Q8H PRN Ivor Costa, MD      . pantoprazole (PROTONIX) EC tablet 40 mg  40 mg Oral Daily Ivor Costa, MD   40 mg at 05/24/20 0919  . potassium chloride (KLOR-CON) packet 40 mEq  40 mEq Oral BID Joanne Nimrod, MD   40 mEq at 05/24/20 0910  . saccharomyces boulardii (FLORASTOR) capsule 250 mg  250 mg Oral Daily Ivor Costa, MD   250 mg at 05/24/20 0920  . vancomycin (VANCOREADY) IVPB 750 mg/150 mL  750 mg Intravenous BID Benn Moulder, [facility]   Stopped at 05/24/20 1122  . zinc sulfate capsule 220 mg  220 mg Oral Daily Ivor Costa, MD   220 mg at 05/24/20 1017   Current Outpatient Medications  Medication Sig Dispense Refill  . acetaminophen (TYLENOL) 500 MG tablet Take 500 mg by mouth every 8 (eight) hours as needed for mild pain or moderate pain.     Marland Kitchen apixaban (ELIQUIS) 5 MG TABS tablet Take 1 tablet (5 mg total) by mouth 2 (two) times daily. 60 tablet 0  . ascorbic acid (VITAMIN C) 500 MG tablet Take 500 mg by  mouth daily.    . budesonide (ENTOCORT EC) 3 MG 24 hr capsule Take 9 mg by mouth daily.     . calcium-vitamin D (OSCAL WITH D) 500-200 MG-UNIT TABS tablet Take 1 tablet by mouth 3 (three) times daily.     Marland Kitchen  cholecalciferol (VITAMIN D) 1000 units tablet Take 3 tablets by mouth once daily    . Cyanocobalamin (VITAMIN B-12) 5000 MCG LOZG Subl Place 1 tablet under the tongue once dailY    . diltiazem (CARDIZEM CD) 120 MG 24 hr capsule Take 1 capsule (120 mg total) by mouth daily. 30 capsule 0  . DULoxetine (CYMBALTA) 20 MG capsule Take 20 mg by mouth daily.    . ferrous sulfate 325 (65 FE) MG tablet Take 325 mg by mouth daily with breakfast.    . gabapentin (NEURONTIN) 300 MG capsule Take 1 capsule (300 mg total) by mouth 3 (three) times daily. (Patient taking differently: Take 300 mg by mouth. TAKE 1 CAPSULE BY MOUTH TWICE DAILY AND 2 CAPSULES NIGHTLY AS DIRECTED) 90 capsule 0  . hydrocortisone 1 % ointment Apply 1 application topically 3 (three) times daily.    Marland Kitchen ipratropium-albuterol (DUONEB) 0.5-2.5 (3) MG/3ML SOLN Take 3 mLs by nebulization.    Marland Kitchen lisinopril (ZESTRIL) 2.5 MG tablet Take 1 tablet (2.5 mg total) by mouth daily. 30 tablet 0  . liver oil-zinc oxide (DESITIN) 40 % ointment Apply topically 2 (two) times daily. 56.7 g 0  . loperamide (LOPERAMIDE A-D) 2 MG tablet Take 2 mg by mouth in the morning and at bedtime.     . magnesium oxide (MAG-OX) 400 MG tablet Take 400 mg by mouth 2 (two) times daily.     Marland Kitchen menthol-cetylpyridinium (CEPACOL) 3 MG lozenge Take 1 lozenge by mouth as needed for sore throat.    . metoprolol tartrate (LOPRESSOR) 25 MG tablet Take 1 tablet (25 mg total) by mouth 2 (two) times daily. (Patient taking differently: Take 25 mg by mouth daily. ) 60 tablet 0  . Multiple Vitamin (MULTIVITAMIN) tablet Take 1 tablet by mouth daily.    Marland Kitchen NEXIUM 40 MG capsule Take 40 mg by mouth daily.     . prednisoLONE acetate (PRED FORTE) 1 % ophthalmic suspension Place 1 drop into both eyes  at bedtime.    . saccharomyces boulardii (FLORASTOR) 250 MG capsule Take 250 mg by mouth daily.    Marland Kitchen torsemide (DEMADEX) 20 MG tablet Take 40 mg by mouth every other day.    . zinc sulfate (ZINC-220) 220 (50 Zn) MG capsule Take 220 mg by mouth daily.     . colestipol (COLESTID) 1 g tablet Take 1 g by mouth 2 (two) times daily. Do not take other medications within 1 hr before or 1 hr after taking Colestipol.    . potassium chloride SA (K-DUR,KLOR-CON) 20 MEQ tablet Take 20 mEq by mouth 2 (two) times daily.      VITAL SIGNS: BP 136/64   Pulse 70   Temp 98.5 F (36.9 C) (Axillary)   Resp 18   Ht 5' (1.524 m)   Wt 71.2 kg   SpO2 100%   BMI 30.66 kg/m  Filed Weights   05/22/20 1012  Weight: 71.2 kg    Estimated body mass index is 30.66 kg/m as calculated from the following:   Height as of this encounter: 5' (1.524 m).   Weight as of this encounter: 71.2 kg.  LABS: CBC:    Component Value Date/Time   WBC 7.5 05/24/2020 0551   HGB 8.1 (L) 05/24/2020 0551   HGB 9.1 (L) 01/30/2013 0446   HCT 26.4 (L) 05/24/2020 0551   HCT 33.7 (L) 01/12/2013 1321   PLT 266 05/24/2020 0551   PLT 392 01/30/2013 0446   Comprehensive Metabolic Panel:  Component Value Date/Time   NA 141 05/24/2020 0551   NA 132 (L) 01/30/2013 0446   K 2.3 (LL) 05/24/2020 0551   K 3.9 01/30/2013 0446   BUN 15 05/24/2020 0551   BUN 11 01/30/2013 0446   CREATININE 0.49 05/24/2020 0551   CREATININE 0.63 01/30/2013 0446   ALBUMIN 2.5 (L) 05/22/2020 1018     Review of Systems  Respiratory: Positive for shortness of breath.   Musculoskeletal: Positive for arthralgias.  Unless otherwise noted, a complete review of systems is negative.  Physical Exam General: NAD, frail chronically-ill appearing Cardiovascular: regular rate and rhythm Pulmonary: diminished bilaterally  Abdomen: soft, nontender, + bowel sounds, incontinent of stool Extremities: no edema, no joint deformities Neurological: alert and  oriented x3, mood appropriate, follows commands    Prognosis:  Guarded   Discharge Planning:  [place] for rehab with Palliative care service follow-up  Recommendations: . DNR/DNI-as confirmed by daughter and Advanced Directives on file . Continue with current plan of care per medical team, no escalation of care . Patient and family remains hopeful for some improvement/stability. Expressed goals to continue to treat the treatable, return back to SNF for continued rehabilitation, with a goal of eventually returning home with caregivers and family support. Family realistic in goals and understands disease trajectory and continued changes in patient's health possibly creating new baseline.  Marland Kitchen PT/OT/SLP eval placed as discussed with Dr. Reesa Chew via chat.  Marland Kitchen PMT will continue to support and follow. Please call team line with urgent needs.   Palliative Performance Scale: PPS 30%              Patient and daughter, Joanne [name] expressed understanding and was in agreement with this plan.   Thank you for allowing the Palliative Medicine Team to assist in the care of this patient.  Time In: 1415 Time Out: 1510 Time Total: 55 min.   Visit consisted of counseling and education dealing with the complex and emotionally intense issues of symptom management and palliative care in the setting of serious and potentially life-threatening illness.Greater than 50%  of this time was spent counseling and coordinating care related to the above assessment and plan.  Signed by:  Alda Lea, AGPCNP-BC Palliative Medicine Team  Phone: [phone] Pager: [phone] Amion: Bjorn Pippin

## 2020-05-24 NOTE — ED Notes (Signed)
Request to have IV potassium instead of PO.

## 2020-05-24 NOTE — ED Notes (Signed)
Pt was cleaned and linen changed at this time. Pt with no other needs noted.

## 2020-05-24 NOTE — Progress Notes (Signed)
SUBJECTIVE: Patient resting comfortably.  Denies shortness of breath or chest pain.  Patient is currently on her baseline oxygen of 4 L nasal cannula.   Vitals:   05/23/20 2130 05/24/20 0430 05/24/20 0600 05/24/20 0804  BP:   (!) 152/57 (!) 153/75  Pulse: 93 84 90 85  Resp: (!) 21 18 (!) 30 16  Temp:      TempSrc:      SpO2: 100% 98% 100% 100%  Weight:      Height:        Intake/Output Summary (Last 24 hours) at 05/24/2020 1009 Last data filed at 05/23/2020 2303 Gross per 24 hour  Intake 676.53 ml  Output --  Net 676.53 ml    LABS: Basic Metabolic Panel: Recent Labs    05/23/20 0614 05/24/20 0551  NA 141 141  K 2.9* 2.3*  CL 99 101  CO2 32 31  GLUCOSE 95 89  BUN 22 15  CREATININE 0.77 0.49  CALCIUM 7.8* 7.5*  MG 1.3* 1.6*   Liver Function Tests: Recent Labs    05/22/20 1018  AST 36  ALT 29  ALKPHOS 102  BILITOT 0.6  PROT 5.5*  ALBUMIN 2.5*   No results for input(s): LIPASE, AMYLASE in the last 72 hours. CBC: Recent Labs    05/22/20 1018 05/22/20 1018 05/23/20 0614 05/24/20 0551  WBC 25.1*   < > 8.2 7.5  NEUTROABS 22.4*  --   --   --   HGB 9.4*   < > 8.4* 8.1*  HCT 31.2*   < > 25.5* 26.4*  MCV 92.0   < > 87.0 88.9  PLT 354   < > 263 266   < > = values in this interval not displayed.   Cardiac Enzymes: No results for input(s): CKTOTAL, CKMB, CKMBINDEX, TROPONINI in the last 72 hours. BNP: Invalid input(s): POCBNP D-Dimer: No results for input(s): DDIMER in the last 72 hours. Hemoglobin A1C: No results for input(s): HGBA1C in the last 72 hours. Fasting Lipid Panel: No results for input(s): CHOL, HDL, LDLCALC, TRIG, CHOLHDL, LDLDIRECT in the last 72 hours. Thyroid Function Tests: No results for input(s): TSH, T4TOTAL, T3FREE, THYROIDAB in the last 72 hours.  Invalid input(s): FREET3 Anemia Panel: No results for input(s): VITAMINB12, FOLATE, FERRITIN, TIBC, IRON, RETICCTPCT in the last 72 hours.   PHYSICAL EXAM General: Well developed,  well nourished, in no acute distress HEENT:  Normocephalic and atramatic Neck:  No JVD.  Lungs: Clear bilaterally to auscultation and percussion. Heart: Atrial fibrillation. Normal S1 and S2 without gallops or murmurs.  Abdomen: Bowel sounds are positive, abdomen soft and non-tender  Msk:  Back normal, normal gait. Normal strength and tone for age. Extremities: No clubbing, cyanosis or edema.   Neuro: Alert and oriented X 3. Psych:  Good affect, responds appropriately  TELEMETRY: Atrial fibrillation.  80-100/BPM  ASSESSMENT AND PLAN: Patient presenting to the emergency department with shortness of breath.  Patient was then found to have atrial fibrillation with RVR.  Patient given digoxin and Cardizem and now with rate control.  Would recommend continuing Cardizem and metoprolol for rate control and anticoagulation with Eliquis.  Patient mildly fluid overloaded with history of HFrEF.  Would recommend continuing IV diuresis for today with possible transition back to home dose torsemide tomorrow.  Would recommend restarting home dose of lisinopril when able.  We will continue to follow.  Principal Problem:   Acute on chronic respiratory failure with hypoxia and hypercapnia (HCC) Active Problems:  Benign essential HTN   GERD without esophagitis   Crohn disease ([place])   Depression   Atrial fibrillation with RVR (HCC)   Acute on chronic systolic CHF (congestive heart failure) (HCC)   Severe sepsis (HCC)   Elevated troponin   HCAP (healthcare-associated pneumonia)   Normocytic anemia   Acute exacerbation of CHF (congestive heart failure) ([place])    Adaline Sill, NP-C 05/24/2020 10:09 AM

## 2020-05-24 NOTE — ED Notes (Signed)
Pt had small soft BM. Rolled, cleaned with wipes, new brief placed on pt.

## 2020-05-24 NOTE — TOC Progression Note (Signed)
Transition of Care [facility]) - Progression Note    Patient Details  Name: EDITHE DOBBIN MRN: [id number] Date of Birth: 1930/09/30  Transition of Care [facility] CM/SW [place], [place] Phone Number: [phone] 05/24/2020, 4:57 PM  Clinical Narrative:     CSW spoke to patient and daughter for status update.  Patient and daughter [name], were speaking to palliative care.  SNF placement pending PT evaluation.  Patient and daughter want patient to return to [facility]  Expected Discharge Plan: [place] Barriers to Discharge: Continued Medical Work up  Expected Discharge Plan and Services Expected Discharge Plan: [place] In-house Referral: Clinical Social Work     Living arrangements for the past 2 months: [place]                                       Social Determinants of Health (SDOH) Interventions    Readmission Risk Interventions No flowsheet data found.

## 2020-05-24 NOTE — Progress Notes (Signed)
PROGRESS NOTE    Joanne [name]  EUM:[id number] DOB: 1931-08-25 DOA: 05/22/2020 PCP: Tracie Harrier, MD   Brief Narrative: Taken from H&P. Joanne [name] is a 86 y.o. female with medical history significant of hypertension, hyperlipidemia, GERD, depression,Fuch'sendothelial dystrophy, Crohn's disease, anemia, Zenker's diverticulum, a fib on Eliquis, sCHF, anemia, who presents with SOB, initially requiring BiPAP.  Now off the BiPAP and saturating well on her baseline oxygen of 3 to 4 L. Patient was found to be in A. fib with RVR and hypotensive. She is Dr. Bethanne Ginger patient, and Dr. Chancy Milroy who was covering [facility] cardiology was consulted and advised to give her digoxin. Patient received 500 cc bolus in ED with improvement in her blood pressure. On arrival she had leukocytosis at 25,BNP 1598, lactic acid 4.3, 3.8, INR 1.01, PTT 31, troponin 39, negative Covid PCR, electrolytes renal function okay, temperature normal, tachypnea with RR 29.  Procalcitonin of 1.48. Chest x-ray with cardiomegaly and pulmonary vascular congestion.LEFT interstitial/airspace disease and new RIGHT interstitial/mild airspace opacities, favor edema over infection. 2. Probable small bilateral pleural effusions. 3. LEFT LOWER lung consolidation/atelectasis.  She was started on antibiotics for a possible pneumonia and gentle diuresis.  Subjective: Patient has no new complaint today.  She is quite hard of hearing.  Continue to have cough.  She was not happy as she just received her pills in applesauce, stating that it tastes really bad.  Assessment & Plan:   Principal Problem:   Acute on chronic respiratory failure with hypoxia and hypercapnia (HCC) Active Problems:   Benign essential HTN   GERD without esophagitis   Crohn disease (HCC)   Depression   Atrial fibrillation with RVR (HCC)   Acute on chronic systolic CHF (congestive heart failure) (HCC)   Severe sepsis (HCC)   Elevated troponin   HCAP  (healthcare-associated pneumonia)   Normocytic anemia   Acute exacerbation of CHF (congestive heart failure) (HCC)  Acute on chronic respiratory failure with hypoxia and hypercapnia due to acute on chronic systolic CHF exacerbation and possible HCAP. Patient appears clinically volume up with 1% lower extremity edema and significant edema surrounding her buttock area. Patient is incontinent and uses diaper-unable to calculate output. Clinically seems improving as she was saturating well on her baseline oxygen of 3 to 4 L per daughter.Echocardiogram with EF of 40 to 45% which is an improvement from her prior echo done in July 2021 where EF was 30 to 35%, global hypokinesis and IVC with more than 50% collapsibility.  Blood cultures negative.  Urine cultures with 20,000 colonies of Enterococcus faecalis with good sensitivity.  She was started on cefepime and vancomycin. Repeat chest x-ray this morning with persistent pulmonary vascular congestion and a possible left lower lobe infiltrate. -Continue broad-spectrum antibiotics for now-concern of lower extremity cellulitis, which seems improving now. -Continue supplemental oxygen to keep the saturation above 90%. -Increase Lasix to 40 mg twice daily for today-per cardiology note she can go back to her home dose from tomorrow. -Daily BMP. -Daily weights. -Obtain REDs Vest  Reading.  Severe sepsis due to possible HCAP (healthcare-associated pneumonia): Patient meets criteria for severe sepsis with leukocytosis, tachycardia, tachypnea with heart rate of 170, also has hypotension.  Lactic acid is elevated 4.3 -->3.8>>.1.9.  Patient has received 500 cc of normal saline, will not give more IV fluid due to fluid overload.  Leukocytosis resolved.  Lactic acidosis resolved.  Blood cultures negative in 24-hour, UA with pyuria, urine culture with 20,000 colonies of Enterococcus faecalis with good  sensitivity.  Procalcitonin at 1.48. -Continue with cefepime and  vancomycin.  Lower extremity cellulitis.  Lower extremities with edema and erythema, left more than right.  Concerning for cellulitis.  No hyperthermia. -Patient is already on antibiotics. -Continue to monitor.  Hypokalemia.  Patient has an history of chronic hypokalemia and hypomagnesemia secondary to persistent GI losses with her Crohn's disease. potassium of 2.3 with magnesium of 1.6 today. -Replete electrolytes and monitor  Elevated troponin.  No chest pain.  Peaked at 47, now trending down.  Most likely secondary to demand ischemia.  A. fib with RVR.  Dr. Humphrey Rolls was consulted over the weekend and they advised giving her a dose of digoxin. Heart rate improved heart rate stays in the 90s. -Continue with Cardizem. -Increase the dose of metoprolol from 25 to 50 mg daily. -Continue with Eliquis -Continue to monitor.  Essential hypertension.  Blood pressure within goal today. -Continue IV Lasix along with Cardizem and metoprolol. -Continue to monitor  GERD without esophagitis -Continue Protonix  Crohn disease (HCC) -Continue budesonide -As needed Imodium  Depression -Continue home medications  Normocytic anemia: Hemoglobin dropped to 8.1 today, no obvious bleeding but patient is on Eliquis. -Continue to monitor-transfuse if below 7. -Check FOBT. -Continue home dose of iron supplement.  Objective: Vitals:   05/23/20 2130 05/24/20 0430 05/24/20 0600 05/24/20 0804  BP:   (!) 152/57 (!) 153/75  Pulse: 93 84 90 85  Resp: (!) 21 18 (!) 30 16  Temp:      TempSrc:      SpO2: 100% 98% 100% 100%  Weight:      Height:        Intake/Output Summary (Last 24 hours) at 05/24/2020 1252 Last data filed at 05/24/2020 1127 Gross per 24 hour  Intake 676.53 ml  Output 1000 ml  Net -323.47 ml   Filed Weights   05/22/20 1012  Weight: 71.2 kg    Examination:  General.  Well-developed, hard of hearing elderly lady, in no acute distress. Pulmonary.  Lungs clear bilaterally,  normal respiratory effort. CV.  Irregularly irregular Abdomen.  Soft, nontender, nondistended, BS positive. CNS.  Alert and oriented.  No focal neurologic deficit. Extremities.  1+ LE edema with some erythema which seems improving as compared to before, 2+ buttock area edema, no cyanosis, pulses intact and symmetrical. Psychiatry.  Judgment and insight appears impaired.  DVT prophylaxis: Eliquis Code Status: DNR Family Communication: Daughter [name] was updated on phone.  Disposition Plan:  Status is: Inpatient  Remains inpatient appropriate because:Inpatient level of care appropriate due to severity of illness   Dispo: The patient is from: SNF              Anticipated d/c is to: SNF              Anticipated d/c date is: 2 days              Patient currently is not medically stable to d/c.  Consultants:   Cardiology  Procedures:  Antimicrobials:  Cefepime Vancomycin.  Data Reviewed: I have personally reviewed following labs and imaging studies  CBC: Recent Labs  Lab 05/22/20 1018 05/23/20 0614 05/24/20 0551  WBC 25.1* 8.2 7.5  NEUTROABS 22.4*  --   --   HGB 9.4* 8.4* 8.1*  HCT 31.2* 25.5* 26.4*  MCV 92.0 87.0 88.9  PLT 354 263 583   Basic Metabolic Panel: Recent Labs  Lab 05/22/20 1018 05/23/20 0614 05/24/20 0551  NA 140 141 141  K 3.5  2.9* 2.3*  CL 96* 99 101  CO2 29 32 31  GLUCOSE 185* 95 89  BUN 20 22 15   CREATININE 0.75 0.77 0.49  CALCIUM 8.4* 7.8* 7.5*  MG  --  1.3* 1.6*   GFR: Estimated Creatinine Clearance: 44.5 mL/min (by C-G formula based on SCr of 0.49 mg/dL). Liver Function Tests: Recent Labs  Lab 05/22/20 1018  AST 36  ALT 29  ALKPHOS 102  BILITOT 0.6  PROT 5.5*  ALBUMIN 2.5*   No results for input(s): LIPASE, AMYLASE in the last 168 hours. No results for input(s): AMMONIA in the last 168 hours. Coagulation Profile: Recent Labs  Lab 05/22/20 1018  INR 1.1   Cardiac Enzymes: No results for input(s): CKTOTAL, CKMB, CKMBINDEX,  TROPONINI in the last 168 hours. BNP (last 3 results) No results for input(s): PROBNP in the last 8760 hours. HbA1C: No results for input(s): HGBA1C in the last 72 hours. CBG: No results for input(s): GLUCAP in the last 168 hours. Lipid Profile: No results for input(s): CHOL, HDL, LDLCALC, TRIG, CHOLHDL, LDLDIRECT in the last 72 hours. Thyroid Function Tests: No results for input(s): TSH, T4TOTAL, FREET4, T3FREE, THYROIDAB in the last 72 hours. Anemia Panel: No results for input(s): VITAMINB12, FOLATE, FERRITIN, TIBC, IRON, RETICCTPCT in the last 72 hours. Sepsis Labs: Recent Labs  Lab 05/22/20 1018 05/22/20 1111 05/22/20 1435 05/23/20 0941  PROCALCITON  --   --  1.48  --   LATICACIDVEN 4.3* 3.8*  --  1.9    Recent Results (from the past 240 hour(s))  Blood culture (single)     Status: None (Preliminary result)   Collection Time: 05/22/20 10:19 AM   Specimen: BLOOD  Result Value Ref Range Status   Specimen Description BLOOD LEFT ARM  Final   Special Requests   Final    BOTTLES DRAWN AEROBIC AND ANAEROBIC Blood Culture adequate volume   Culture   Final    NO GROWTH 2 DAYS Performed at [facility], [address], [place], [place] [address]    Report Status PENDING  Incomplete  Respiratory Panel by RT PCR (Flu A&B, Covid) - Nasopharyngeal Swab     Status: None   Collection Time: 05/22/20 10:19 AM   Specimen: Nasopharyngeal Swab  Result Value Ref Range Status   SARS Coronavirus 2 by RT PCR NEGATIVE NEGATIVE Final    Comment: (NOTE) SARS-CoV-2 target nucleic acids are NOT DETECTED.  The SARS-CoV-2 RNA is generally detectable in upper respiratoy specimens during the acute phase of infection. The lowest concentration of SARS-CoV-2 viral copies this assay can detect is 131 copies/mL. A negative result does not preclude SARS-Cov-2 infection and should not be used as the sole basis for treatment or other patient management decisions. A negative result may occur  with  improper specimen collection/handling, submission of specimen other than nasopharyngeal swab, presence of viral mutation(s) within the areas targeted by this assay, and inadequate number of viral copies (<131 copies/mL). A negative result must be combined with clinical observations, patient history, and epidemiological information. The expected result is Negative.  Fact Sheet for Patients:  PinkCheek.be  Fact Sheet for Healthcare Providers:  GravelBags.it  This test is no t yet approved or cleared by the [place] FDA and  has been authorized for detection and/or diagnosis of SARS-CoV-2 by FDA under an Emergency Use Authorization (EUA). This EUA will remain  in effect (meaning this test can be used) for the duration of the COVID-19 declaration under Section 564(b)(1) of the  Act, 21 U.S.C. section 360bbb-3(b)(1), unless the authorization is terminated or revoked sooner.     Influenza A by PCR NEGATIVE NEGATIVE Final   Influenza B by PCR NEGATIVE NEGATIVE Final    Comment: (NOTE) The Xpert Xpress SARS-CoV-2/FLU/RSV assay is intended as an aid in  the diagnosis of influenza from Nasopharyngeal swab specimens and  should not be used as a sole basis for treatment. Nasal washings and  aspirates are unacceptable for Xpert Xpress SARS-CoV-2/FLU/RSV  testing.  Fact Sheet for Patients: PinkCheek.be  Fact Sheet for Healthcare Providers: GravelBags.it  This test is not yet approved or cleared by the [place] FDA and  has been authorized for detection and/or diagnosis of SARS-CoV-2 by  FDA under an Emergency Use Authorization (EUA). This EUA will remain  in effect (meaning this test can be used) for the duration of the  Covid-19 declaration under Section 564(b)(1) of the Act, 21  U.S.C. section 360bbb-3(b)(1), unless the authorization is  terminated or  revoked. Performed at [facility], [address] [place], [place] [address]   Urine culture     Status: Abnormal   Collection Time: 05/22/20  4:18 PM   Specimen: Urine, Random  Result Value Ref Range Status   Specimen Description   Final    URINE, RANDOM Performed at [facility], [address], [place], [place] [address]    Special Requests   Final    NONE Performed at [facility], [address] [place], [place] [address]    Culture 20,000 COLONIES/mL ENTEROCOCCUS FAECALIS (A)  Final   Report Status 05/24/2020 FINAL  Final   Organism ID, Bacteria ENTEROCOCCUS FAECALIS (A)  Final      Susceptibility   Enterococcus faecalis - MIC*    AMPICILLIN <=2 SENSITIVE Sensitive     NITROFURANTOIN <=16 SENSITIVE Sensitive     VANCOMYCIN 1 SENSITIVE Sensitive     * 20,000 COLONIES/mL ENTEROCOCCUS FAECALIS  Culture, blood (single) w Reflex to ID Panel     Status: None (Preliminary result)   Collection Time: 05/23/20  6:14 AM   Specimen: BLOOD  Result Value Ref Range Status   Specimen Description BLOOD LFA  Final   Special Requests   Final    BOTTLES DRAWN AEROBIC AND ANAEROBIC Blood Culture adequate volume   Culture   Final    NO GROWTH 1 DAY Performed at [facility], [address], [place], [place] [address]    Report Status PENDING  Incomplete  MRSA PCR Screening     Status: Abnormal   Collection Time: 05/23/20  8:16 AM   Specimen: Nasopharyngeal  Result Value Ref Range Status   MRSA by PCR POSITIVE (A) NEGATIVE Final    Comment:        The GeneXpert MRSA Assay (FDA approved for NASAL specimens only), is one component of a comprehensive MRSA colonization surveillance program. It is not intended to diagnose MRSA infection nor to guide or monitor treatment for MRSA infections. RESULT CALLED TO, READ BACK BY AND VERIFIED WITH: ANNIE SMITH AT 1540 ON 05/23/2020 [place]. Performed at [facility], [address]  [place],  [address]   Culture, sputum-assessment     Status: None   Collection Time: 05/24/20  8:58 AM   Specimen: Sputum  Result Value Ref Range Status   Specimen Description SPUTUM  Final   Special Requests NONE  Final   Sputum evaluation   Final    THIS SPECIMEN IS ACCEPTABLE FOR SPUTUM  CULTURE Performed at [facility], [address] [place], [place] [address]    Report Status 05/24/2020 FINAL  Final     Radiology Studies: DG Chest 2 View  Result Date: 05/24/2020 CLINICAL DATA:  Shortness of breath EXAM: CHEST - 2 VIEW COMPARISON:  May 22, 2020. FINDINGS: There is persistent cardiomegaly with pulmonary venous hypertension. There is airspace opacity in the left lower lobe with moderate left pleural effusion. There is a degree of underlying interstitial prominence, likely edema. There is atelectatic change in the left upper lobe. No adenopathy appreciable. There is anterior wedging of a lower thoracic vertebral body. IMPRESSION: Cardiomegaly with pulmonary vascular congestion. Left pleural effusion with left lower lobe consolidation which potentially may represent alveolar edema. Underlying interstitial edema. Overall appearance is felt to be indicative of a degree of congestive heart failure. Note that there may be superimposed pneumonia in the left base. There is atelectatic change in the left upper lobe. Electronically Signed   By: Lowella Grip III M.D.   On: 05/24/2020 08:49   ECHOCARDIOGRAM COMPLETE  Result Date: 05/23/2020    ECHOCARDIOGRAM REPORT   Patient Name:   ARMA REINING Date of Exam: 05/23/2020 Medical Rec #:  [id number]     Height:       60.0 in Accession #:    [id number]    Weight:       157.0 lb Date of Birth:  Jun 04, 1931    BSA:          1.684 m Patient Age:    48 years      BP:           127/58 mmHg Patient Gender: F             HR:           107 bpm. Exam Location:  [facility] Procedure: 2D Echo Indications:     Atrial Fibrillation 427.31  History:          Patient has prior history of Echocardiogram examinations. CHF,                  Pleural Effusion; Risk Factors:Hypertension and Dyslipidemia.  Sonographer:     L Thornton-Maynard Referring Phys:  [id number] Soledad Gerlach NIU Diagnosing Phys: Neoma Laming MD IMPRESSIONS  1. Left ventricular ejection fraction, by estimation, is 40 to 45%. The left ventricle has mildly decreased function. The left ventricle demonstrates global hypokinesis. The left ventricular internal cavity size was mildly to moderately dilated. There is moderate concentric left ventricular hypertrophy. Left ventricular diastolic parameters are indeterminate.  2. Right ventricular systolic function is normal. The right ventricular size is normal. There is mildly elevated pulmonary artery systolic pressure.  3. Left atrial size was mildly dilated.  4. Right atrial size was mild to moderately dilated.  5. The mitral valve is normal in structure. Moderate mitral valve regurgitation. No evidence of mitral stenosis.  6. The aortic valve is normal in structure. Aortic valve regurgitation is trivial. No aortic stenosis is present.  7. The inferior vena cava is normal in size with greater than 50% respiratory variability, suggesting right atrial pressure of 3 mmHg. FINDINGS  Left Ventricle: Left ventricular ejection fraction, by estimation, is 40 to 45%. The left ventricle has mildly decreased function. The left ventricle demonstrates global hypokinesis. The left ventricular internal cavity size was mildly to moderately dilated. There is moderate concentric left ventricular hypertrophy. Left ventricular diastolic parameters are indeterminate. Right Ventricle: The right ventricular size is normal. No  increase in right ventricular wall thickness. Right ventricular systolic function is normal. There is mildly elevated pulmonary artery systolic pressure. The tricuspid regurgitant velocity is 2.88  m/s, and with an assumed right atrial pressure of 10 mmHg, the estimated right  ventricular systolic pressure is 26.8 mmHg. Left Atrium: Left atrial size was mildly dilated. Right Atrium: Right atrial size was mild to moderately dilated. Pericardium: There is no evidence of pericardial effusion. Mitral Valve: The mitral valve is normal in structure. Moderate mitral valve regurgitation. No evidence of mitral valve stenosis. MV peak gradient, 7.6 mmHg. The mean mitral valve gradient is 4.0 mmHg. Tricuspid Valve: The tricuspid valve is normal in structure. Tricuspid valve regurgitation is mild . No evidence of tricuspid stenosis. Aortic Valve: The aortic valve is normal in structure. Aortic valve regurgitation is trivial. No aortic stenosis is present. Aortic valve mean gradient measures 5.0 mmHg. Aortic valve peak gradient measures 9.0 mmHg. Aortic valve area, by VTI measures 2.14 cm. Pulmonic Valve: The pulmonic valve was normal in structure. Pulmonic valve regurgitation is mild. No evidence of pulmonic stenosis. Aorta: The aortic root is normal in size and structure. Venous: The inferior vena cava is normal in size with greater than 50% respiratory variability, suggesting right atrial pressure of 3 mmHg. IAS/Shunts: No atrial level shunt detected by color flow Doppler.  LEFT VENTRICLE PLAX 2D LVIDd:         3.15 cm  Diastology LVIDs:         2.42 cm  LV e' medial:    6.96 cm/s LV PW:         1.38 cm  LV E/e' medial:  17.5 LV IVS:        1.30 cm  LV e' lateral:   6.20 cm/s LVOT diam:     2.10 cm  LV E/e' lateral: 19.7 LV SV:         54 LV SV Index:   32 LVOT Area:     3.46 cm  RIGHT VENTRICLE RV S prime:     13.80 cm/s TAPSE (M-mode): 1.6 cm LEFT ATRIUM             Index LA diam:        4.10 cm 2.43 cm/m LA Vol (A2C):   49.6 ml 29.45 ml/m LA Vol (A4C):   58.4 ml 34.68 ml/m LA Biplane Vol: 53.9 ml 32.00 ml/m  AORTIC VALVE                    PULMONIC VALVE AV Area (Vmax):    2.00 cm     PV Vmax:       1.14 m/s AV Area (Vmean):   1.87 cm     PV Peak grad:  5.2 mmHg AV Area (VTI):     2.14  cm AV Vmax:           150.00 cm/s AV Vmean:          109.000 cm/s AV VTI:            0.254 m AV Peak Grad:      9.0 mmHg AV Mean Grad:      5.0 mmHg LVOT Vmax:         86.50 cm/s LVOT Vmean:        58.700 cm/s LVOT VTI:          0.157 m LVOT/AV VTI ratio: 0.62  AORTA Ao Root diam: 3.40 cm MITRAL VALVE  TRICUSPID VALVE MV Area (PHT): 3.17 cm     TR Peak grad:   33.2 mmHg MV Peak grad:  7.6 mmHg     TR Vmax:        288.00 cm/s MV Mean grad:  4.0 mmHg MV Vmax:       1.38 m/s     SHUNTS MV Vmean:      98.8 cm/s    Systemic VTI:  0.16 m MV Decel Time: 239 msec     Systemic Diam: 2.10 cm MV E velocity: 122.00 cm/s Neoma Laming MD Electronically signed by Neoma Laming MD Signature Date/Time: 05/23/2020/2:35:54 PM    Final     Scheduled Meds: . apixaban  5 mg Oral BID  . ascorbic acid  500 mg Oral Daily  . budesonide  9 mg Oral Daily  . calcium-vitamin D  1 tablet Oral TID  . Chlorhexidine Gluconate Cloth  6 each Topical Q0600  . cholecalciferol  3,000 Units Oral Daily  . colestipol  1 g Oral BID  . diltiazem  120 mg Oral Daily  . DULoxetine  20 mg Oral Daily  . ferrous sulfate  325 mg Oral Q breakfast  . furosemide  40 mg Intravenous BID  . gabapentin  300 mg Oral TID  . hydrocortisone  1 application Topical TID  . ipratropium-albuterol  3 mL Nebulization Q4H  . liver oil-zinc oxide   Topical BID  . loperamide  2 mg Oral BID  . magnesium oxide  400 mg Oral BID  . metoprolol tartrate  50 mg Oral Daily  . multivitamin with minerals  1 tablet Oral Daily  . mupirocin ointment  1 application Nasal BID  . pantoprazole  40 mg Oral Daily  . potassium chloride  40 mEq Oral BID  . saccharomyces boulardii  250 mg Oral Daily  . zinc sulfate  220 mg Oral Daily   Continuous Infusions: . ceFEPime (MAXIPIME) IV Stopped (05/24/20 1005)  . vancomycin Stopped (05/24/20 1122)     LOS: 2 days   Time spent: 35 minutes.  Lorella Nimrod, MD [facility]  If 7PM-7AM, please contact  night-coverage [email or web address]  05/24/2020, 12:52 PM   This record has been created using Systems analyst. Errors have been sought and corrected,but may not always be located. Such creation errors do not reflect on the standard of care.

## 2020-05-24 NOTE — Progress Notes (Signed)
OT Cancellation Note  Patient Details Name: Joanne [name] MRN: [id number] DOB: 28-Jun-1931   Cancelled Treatment:    Reason Eval/Treat Not Completed: Medical issues which prohibited therapy. Consult received, chart reviewed. Pt noted with critical low K+ 2.3. Note repletion per MD note but no new labs drawn. Pt contraindicated at this time. Will re-attempt at later date/time as medically appropriate.   Jeni Salles, MPH, MS, OTR/L ascom [phone] 05/24/20, 3:41 PM

## 2020-05-24 NOTE — ED Notes (Signed)
Pt taken to xray 

## 2020-05-25 DIAGNOSIS — J9622 Acute and chronic respiratory failure with hypercapnia: Secondary | ICD-10-CM | POA: Diagnosis not present

## 2020-05-25 DIAGNOSIS — J9621 Acute and chronic respiratory failure with hypoxia: Secondary | ICD-10-CM | POA: Diagnosis not present

## 2020-05-25 LAB — CBC
HCT: 27 % — ABNORMAL LOW (ref 36.0–46.0)
Hemoglobin: 8.4 g/dL — ABNORMAL LOW (ref 12.0–15.0)
MCH: 27.6 pg (ref 26.0–34.0)
MCHC: 31.1 g/dL (ref 30.0–36.0)
MCV: 88.8 fL (ref 80.0–100.0)
Platelets: 302 10*3/uL (ref 150–400)
RBC: 3.04 MIL/uL — ABNORMAL LOW (ref 3.87–5.11)
RDW: 15.7 % — ABNORMAL HIGH (ref 11.5–15.5)
WBC: 11.2 10*3/uL — ABNORMAL HIGH (ref 4.0–10.5)
nRBC: 0 % (ref 0.0–0.2)

## 2020-05-25 LAB — BLOOD GAS, VENOUS
Acid-Base Excess: 6.5 mmol/L — ABNORMAL HIGH (ref 0.0–2.0)
Bicarbonate: 31.8 mmol/L — ABNORMAL HIGH (ref 20.0–28.0)
O2 Saturation: 85 %
Patient temperature: 37
pCO2, Ven: 49 mmHg (ref 44.0–60.0)
pH, Ven: 7.42 (ref 7.250–7.430)
pO2, Ven: 49 mmHg — ABNORMAL HIGH (ref 32.0–45.0)

## 2020-05-25 LAB — PROCALCITONIN: Procalcitonin: 1.07 ng/mL

## 2020-05-25 LAB — BASIC METABOLIC PANEL
Anion gap: 10 (ref 5–15)
BUN: 16 mg/dL (ref 8–23)
CO2: 29 mmol/L (ref 22–32)
Calcium: 8 mg/dL — ABNORMAL LOW (ref 8.9–10.3)
Chloride: 102 mmol/L (ref 98–111)
Creatinine, Ser: 0.57 mg/dL (ref 0.44–1.00)
GFR calc Af Amer: >60 mL/min (ref >60)
GFR calc non Af Amer: >60 mL/min (ref >60)
Glucose, Bld: 107 mg/dL — ABNORMAL HIGH (ref 70–99)
Potassium: 4 mmol/L (ref 3.5–5.1)
Sodium: 141 mmol/L (ref 135–145)

## 2020-05-25 MED ORDER — GABAPENTIN 100 MG PO CAPS
100.0000 mg | ORAL_CAPSULE | Freq: Three times a day (TID) | ORAL | Status: DC
  Filled 2020-05-25: qty 1

## 2020-05-25 MED ORDER — VANCOMYCIN HCL 750 MG/150ML IV SOLN
750.0000 mg | INTRAVENOUS | Status: DC
  Filled 2020-05-25: qty 150

## 2020-05-25 MED ORDER — METOPROLOL TARTRATE 50 MG PO TABS
50.0000 mg | ORAL_TABLET | Freq: Two times a day (BID) | ORAL | Status: DC
  Administered 2020-05-25 – 2020-05-29 (×8): 50 mg via ORAL
  Filled 2020-05-25 (×8): qty 1

## 2020-05-25 NOTE — Care Management Important Message (Signed)
Important Message  Patient Details  Name: Joanne [name] MRN: [id number] Date of Birth: Jun 06, 1931   Medicare Important Message Given:  Yes     [name] 05/25/2020, 11:13 AM

## 2020-05-25 NOTE — Progress Notes (Signed)
PROGRESS NOTE    JANELIZ PRESTWOOD  FYB:[id number] DOB: [birth year] DOA: 05/22/2020 PCP: Tracie Harrier, MD   Brief Narrative: Taken from H&P. WESTLYNN FIFER is a 86 y.o. female with medical history significant of hypertension, hyperlipidemia, GERD, depression,Fuch'sendothelial dystrophy, Crohn's disease, anemia, Zenker's diverticulum, a fib on Eliquis, sCHF, anemia, who presents with SOB, initially requiring BiPAP.  Now off the BiPAP and saturating well on her baseline oxygen of 3 to 4 L. Patient was found to be in A. fib with RVR and hypotensive. She is Dr. Bethanne Ginger patient, and Dr. Chancy Milroy who was covering [facility] cardiology was consulted and advised to give her digoxin. Patient received 500 cc bolus in ED with improvement in her blood pressure. On arrival she had leukocytosis at 25,BNP 1598, lactic acid 4.3, 3.8, INR 1.01, PTT 31, troponin 39, negative Covid PCR, electrolytes renal function okay, temperature normal, tachypnea with RR 29.  Procalcitonin of 1.48. Chest x-ray with cardiomegaly and pulmonary vascular congestion.LEFT interstitial/airspace disease and new RIGHT interstitial/mild airspace opacities, favor edema over infection. 2. Probable small bilateral pleural effusions. 3. LEFT LOWER lung consolidation/atelectasis.  She was started on antibiotics for a possible pneumonia and gentle diuresis.  Subjective: Pt was reportedly alert and interactive early in the morning, however later became more lethargic and confused.  Pt reported not feeling good, but couldn't elaborate.  Venous blood gas obtained which was unremarkable.   Assessment & Plan:   Principal Problem:   Acute on chronic respiratory failure with hypoxia and hypercapnia (HCC) Active Problems:   Benign essential HTN   GERD without esophagitis   Crohn disease (HCC)   Depression   Atrial fibrillation with RVR (HCC)   Acute on chronic systolic CHF (congestive heart failure) (HCC)   Severe sepsis (HCC)   Elevated  troponin   HCAP (healthcare-associated pneumonia)   Normocytic anemia   Acute exacerbation of CHF (congestive heart failure) ([place])   # AMS increased somnolence # Possible hospital delirium  --acute onset late morning today.  Son also concerned about increased confusion.  No focal neurological deficit.   PLAN: --venous blood gas, unremarkable --Monitor for now   Acute on chronic respiratory failure with hypoxia and hypercapnia due to acute on chronic systolic CHF exacerbation and possible HCAP. Patient appears clinically volume up with 1% lower extremity edema and significant edema surrounding her buttock area. Patient is incontinent and uses diaper-unable to calculate output. Clinically seems improving as she was saturating well on her baseline oxygen of 3 to 4 L per daughter.Echocardiogram with EF of 40 to 45% which is an improvement from her prior echo done in July 2021 where EF was 30 to 35%, global hypokinesis and IVC with more than 50% collapsibility.  Blood cultures negative.  Urine cultures with 20,000 colonies of Enterococcus faecalis with good sensitivity.  She was started on cefepime and vancomycin. Repeat chest x-ray this morning with persistent pulmonary vascular congestion and a possible left lower lobe infiltrate. PLAN: -continue vanc/cefe, day 4 of 5 -Continue supplemental oxygen to keep the saturation above 90%. -continue IV lasix 40 mg BID, per cards  Severe sepsis due to possible HCAP (healthcare-associated pneumonia): Patient meets criteria for severe sepsis with leukocytosis, tachycardia, tachypnea with heart rate of 170, also has hypotension.  Lactic acid is elevated 4.3 -->3.8>>.1.9.  Patient has received 500 cc of normal saline, will not give more IV fluid due to fluid overload.  Leukocytosis resolved.  Lactic acidosis resolved.  Blood cultures negative in 24-hour, UA with pyuria, urine  culture with 20,000 colonies of Enterococcus faecalis with good sensitivity.   Procalcitonin at 1.48. PLAN: -continue vanc/cefe, day 4 of 5  Lower extremity cellulitis.  Lower extremities with edema and erythema, left more than right.  Concerning for cellulitis.  No hyperthermia. -Patient is already on antibiotics. -Continue to monitor.  Hypokalemia.  Patient has an history of chronic hypokalemia and hypomagnesemia secondary to persistent GI losses with her Crohn's disease. potassium of 2.3 with magnesium of 1.6 today. -Replete electrolytes and monitor  Elevated troponin.  No chest pain.  Peaked at 29, now trending down.  Most likely secondary to demand ischemia.  A. fib with RVR.  Dr. Humphrey Rolls was consulted over the weekend and they advised giving her a dose of digoxin. Heart rate improved heart rate stays in the 90s. -Continue with Cardizem. --increase Lopressor to 50 mg BID, per cards -Continue with Eliquis --continue on tele  Essential hypertension.  Blood pressure within goal today. -Continue IV Lasix  --continue cardizem and Lopressor  GERD without esophagitis -Continue Protonix  Crohn disease (HCC) -Continue budesonide -As needed Imodium  Depression -Continue home medications  Normocytic anemia: Hemoglobin dropped to 8.1 today, no obvious bleeding but patient is on Eliquis. -Continue to monitor-transfuse if below 7. -Continue home dose of iron supplement.   Objective: Vitals:   05/25/20 0102 05/25/20 0520 05/25/20 1110 05/25/20 1123  BP: 103/63 (!) 145/80  116/71  Pulse: 88 (!) 104 (!) 102 (!) 103  Resp: 20 17  20   Temp: 98.6 F (37 C) 98.5 F (36.9 C)  98.7 F (37.1 C)  TempSrc: Oral Oral  Oral  SpO2: 99% 100% 99% 100%  Weight:      Height:        Intake/Output Summary (Last 24 hours) at 05/25/2020 1848 Last data filed at 05/25/2020 1800 Gross per 24 hour  Intake 583 ml  Output --  Net 583 ml   Filed Weights   05/22/20 1012  Weight: 71.2 kg    Examination:  Constitutional: NAD, lethargic, oriented to self HEENT:  conjunctivae and lids normal, EOMI CV: RRR no M,R,G. Distal pulses +2.  No cyanosis.   RESP: normal respiratory effort, on 3L (baseline) GI: +BS, NTND Extremities: Mild erythema in BLE SKIN: warm, dry and intact Neuro: II - XII grossly intact.     DVT prophylaxis: Eliquis Code Status: DNR Family Communication: son updated at the bedside  Disposition Plan:  Status is: Inpatient  Remains inpatient appropriate because:Inpatient level of care appropriate due to severity of illness   Dispo: The patient is from: SNF              Anticipated d/c is to: SNF              Anticipated d/c date is: 1-2 days              Patient currently is not medically stable to d/c.  Still on IV abx for HCAP.  AMS.   Consultants:   Cardiology  Procedures:  Antimicrobials:  Cefepime Vancomycin.  Data Reviewed: I have personally reviewed following labs and imaging studies  CBC: Recent Labs  Lab 05/22/20 1018 05/23/20 0614 05/24/20 0551 05/25/20 0451  WBC 25.1* 8.2 7.5 11.2*  NEUTROABS 22.4*  --   --   --   HGB 9.4* 8.4* 8.1* 8.4*  HCT 31.2* 25.5* 26.4* 27.0*  MCV 92.0 87.0 88.9 88.8  PLT 354 263 266 299   Basic Metabolic Panel: Recent Labs  Lab 05/22/20 1018 05/23/20  0762 05/24/20 0551 05/25/20 0451  NA 140 141 141 141  K 3.5 2.9* 2.3* 4.0  CL 96* 99 101 102  CO2 29 32 31 29  GLUCOSE 185* 95 89 107*  BUN 20 22 15 16   CREATININE 0.75 0.77 0.49 0.57  CALCIUM 8.4* 7.8* 7.5* 8.0*  MG  --  1.3* 1.6*  --    GFR: Estimated Creatinine Clearance: 44.5 mL/min (by C-G formula based on SCr of 0.57 mg/dL). Liver Function Tests: Recent Labs  Lab 05/22/20 1018  AST 36  ALT 29  ALKPHOS 102  BILITOT 0.6  PROT 5.5*  ALBUMIN 2.5*   No results for input(s): LIPASE, AMYLASE in the last 168 hours. No results for input(s): AMMONIA in the last 168 hours. Coagulation Profile: Recent Labs  Lab 05/22/20 1018  INR 1.1   Cardiac Enzymes: No results for input(s): CKTOTAL, CKMB,  CKMBINDEX, TROPONINI in the last 168 hours. BNP (last 3 results) No results for input(s): PROBNP in the last 8760 hours. HbA1C: No results for input(s): HGBA1C in the last 72 hours. CBG: No results for input(s): GLUCAP in the last 168 hours. Lipid Profile: No results for input(s): CHOL, HDL, LDLCALC, TRIG, CHOLHDL, LDLDIRECT in the last 72 hours. Thyroid Function Tests: No results for input(s): TSH, T4TOTAL, FREET4, T3FREE, THYROIDAB in the last 72 hours. Anemia Panel: No results for input(s): VITAMINB12, FOLATE, FERRITIN, TIBC, IRON, RETICCTPCT in the last 72 hours. Sepsis Labs: Recent Labs  Lab 05/22/20 1018 05/22/20 1111 05/22/20 1435 05/23/20 0941 05/25/20 0451  PROCALCITON  --   --  1.48  --  1.07  LATICACIDVEN 4.3* 3.8*  --  1.9  --     Recent Results (from the past 240 hour(s))  Blood culture (single)     Status: None (Preliminary result)   Collection Time: 05/22/20 10:19 AM   Specimen: BLOOD  Result Value Ref Range Status   Specimen Description BLOOD LEFT ARM  Final   Special Requests   Final    BOTTLES DRAWN AEROBIC AND ANAEROBIC Blood Culture adequate volume   Culture   Final    NO GROWTH 3 DAYS Performed at [facility], [address], [place], [place] [address]    Report Status PENDING  Incomplete  Respiratory Panel by RT PCR (Flu A&B, Covid) - Nasopharyngeal Swab     Status: None   Collection Time: 05/22/20 10:19 AM   Specimen: Nasopharyngeal Swab  Result Value Ref Range Status   SARS Coronavirus 2 by RT PCR NEGATIVE NEGATIVE Final    Comment: (NOTE) SARS-CoV-2 target nucleic acids are NOT DETECTED.  The SARS-CoV-2 RNA is generally detectable in upper respiratoy specimens during the acute phase of infection. The lowest concentration of SARS-CoV-2 viral copies this assay can detect is 131 copies/mL. A negative result does not preclude SARS-Cov-2 infection and should not be used as the sole basis for treatment or other patient management  decisions. A negative result may occur with  improper specimen collection/handling, submission of specimen other than nasopharyngeal swab, presence of viral mutation(s) within the areas targeted by this assay, and inadequate number of viral copies (<131 copies/mL). A negative result must be combined with clinical observations, patient history, and epidemiological information. The expected result is Negative.  Fact Sheet for Patients:  PinkCheek.be  Fact Sheet for Healthcare Providers:  GravelBags.it  This test is no t yet approved or cleared by the [place] FDA and  has been authorized for detection and/or diagnosis of SARS-CoV-2 by FDA under  an Emergency Use Authorization (EUA). This EUA will remain  in effect (meaning this test can be used) for the duration of the COVID-19 declaration under Section 564(b)(1) of the Act, 21 U.S.C. section 360bbb-3(b)(1), unless the authorization is terminated or revoked sooner.     Influenza A by PCR NEGATIVE NEGATIVE Final   Influenza B by PCR NEGATIVE NEGATIVE Final    Comment: (NOTE) The Xpert Xpress SARS-CoV-2/FLU/RSV assay is intended as an aid in  the diagnosis of influenza from Nasopharyngeal swab specimens and  should not be used as a sole basis for treatment. Nasal washings and  aspirates are unacceptable for Xpert Xpress SARS-CoV-2/FLU/RSV  testing.  Fact Sheet for Patients: PinkCheek.be  Fact Sheet for Healthcare Providers: GravelBags.it  This test is not yet approved or cleared by the [place] FDA and  has been authorized for detection and/or diagnosis of SARS-CoV-2 by  FDA under an Emergency Use Authorization (EUA). This EUA will remain  in effect (meaning this test can be used) for the duration of the  Covid-19 declaration under Section 564(b)(1) of the Act, 21  U.S.C. section 360bbb-3(b)(1), unless the  authorization is  terminated or revoked. Performed at [facility], [address] [place], [place] [address]   Urine culture     Status: Abnormal   Collection Time: 05/22/20  4:18 PM   Specimen: Urine, Random  Result Value Ref Range Status   Specimen Description   Final    URINE, RANDOM Performed at [facility], [address], [place], [place] [address]    Special Requests   Final    NONE Performed at [facility], [address] [place], [place] [address]    Culture 20,000 COLONIES/mL ENTEROCOCCUS FAECALIS (A)  Final   Report Status 05/24/2020 FINAL  Final   Organism ID, Bacteria ENTEROCOCCUS FAECALIS (A)  Final      Susceptibility   Enterococcus faecalis - MIC*    AMPICILLIN <=2 SENSITIVE Sensitive     NITROFURANTOIN <=16 SENSITIVE Sensitive     VANCOMYCIN 1 SENSITIVE Sensitive     * 20,000 COLONIES/mL ENTEROCOCCUS FAECALIS  Culture, blood (single) w Reflex to ID Panel     Status: None (Preliminary result)   Collection Time: 05/23/20  6:14 AM   Specimen: BLOOD  Result Value Ref Range Status   Specimen Description BLOOD LFA  Final   Special Requests   Final    BOTTLES DRAWN AEROBIC AND ANAEROBIC Blood Culture adequate volume   Culture   Final    NO GROWTH 2 DAYS Performed at [facility], [address], [place], [place] [address]    Report Status PENDING  Incomplete  MRSA PCR Screening     Status: Abnormal   Collection Time: 05/23/20  8:16 AM   Specimen: Nasopharyngeal  Result Value Ref Range Status   MRSA by PCR POSITIVE (A) NEGATIVE Final    Comment:        The GeneXpert MRSA Assay (FDA approved for NASAL specimens only), is one component of a comprehensive MRSA colonization surveillance program. It is not intended to diagnose MRSA infection nor to guide or monitor treatment for MRSA infections. RESULT CALLED TO, READ BACK BY AND VERIFIED WITH: ANNIE SMITH AT 1497 ON 05/23/2020 [place]. Performed at [facility], [address] [place], [place] [address]   Culture, sputum-assessment     Status: None   Collection Time: 05/24/20  8:58 AM   Specimen: Sputum  Result Value Ref Range Status  Specimen Description SPUTUM  Final   Special Requests NONE  Final   Sputum evaluation   Final    THIS SPECIMEN IS ACCEPTABLE FOR SPUTUM CULTURE Performed at [facility], [address] [place], [place] [address]    Report Status 05/24/2020 FINAL  Final  Culture, respiratory     Status: None (Preliminary result)   Collection Time: 05/24/20  8:58 AM   Specimen: SPU  Result Value Ref Range Status   Specimen Description   Final    SPUTUM Performed at [facility], [address], [place], [place] [address]    Special Requests   Final    NONE Reflexed from [phone] Performed at [facility], [address] [place], [place] [address]    Gram Stain PENDING  Incomplete   Culture   Final    MODERATE STAPHYLOCOCCUS AUREUS SUSCEPTIBILITIES TO FOLLOW Performed at [facility], [place] [address] [place], [place] [address]    Report Status PENDING  Incomplete     Radiology Studies: DG Chest 2 View  Result Date: 05/24/2020 CLINICAL DATA:  Shortness of breath EXAM: CHEST - 2 VIEW COMPARISON:  May 22, 2020. FINDINGS: There is persistent cardiomegaly with pulmonary venous hypertension. There is airspace opacity in the left lower lobe with moderate left pleural effusion. There is a degree of underlying interstitial prominence, likely edema. There is atelectatic change in the left upper lobe. No adenopathy appreciable. There is anterior wedging of a lower thoracic vertebral body. IMPRESSION: Cardiomegaly with pulmonary vascular congestion. Left pleural effusion with left lower lobe consolidation which potentially may represent alveolar edema. Underlying interstitial edema. Overall appearance is felt to be indicative of a degree of congestive heart failure. Note that there may be  superimposed pneumonia in the left base. There is atelectatic change in the left upper lobe. Electronically Signed   By: Lowella Grip III M.D.   On: 05/24/2020 08:49    Scheduled Meds: . apixaban  5 mg Oral BID  . ascorbic acid  500 mg Oral Daily  . budesonide  9 mg Oral Daily  . calcium-vitamin D  1 tablet Oral TID  . Chlorhexidine Gluconate Cloth  6 each Topical Q0600  . cholecalciferol  3,000 Units Oral Daily  . colestipol  1 g Oral BID  . diltiazem  120 mg Oral Daily  . DULoxetine  20 mg Oral Daily  . ferrous sulfate  325 mg Oral Q breakfast  . furosemide  40 mg Intravenous BID  . gabapentin  300 mg Oral TID  . hydrocortisone  1 application Topical TID  . liver oil-zinc oxide   Topical BID  . loperamide  2 mg Oral BID  . magnesium oxide  400 mg Oral BID  . metoprolol tartrate  50 mg Oral BID  . multivitamin with minerals  1 tablet Oral Daily  . mupirocin ointment  1 application Nasal BID  . pantoprazole  40 mg Oral Daily  . potassium chloride  40 mEq Oral BID  . saccharomyces boulardii  250 mg Oral Daily  . zinc sulfate  220 mg Oral Daily   Continuous Infusions: . ceFEPime (MAXIPIME) IV Stopped (05/25/20 1044)  . [START ON 05/26/2020] vancomycin       LOS: 3 days    Enzo Bi, MD [facility]  If 7PM-7AM, please contact night-coverage [email or web address]  05/25/2020, 6:48 PM

## 2020-05-25 NOTE — Progress Notes (Signed)
PT Cancellation Note  Patient Details Name: Joanne [name] MRN: [id number] DOB: [birth year]   Cancelled Treatment:    Reason Eval/Treat Not Completed: Medical issues which prohibited therapy. Patient currently in afib with HR ranging from 108-130s at rest. Will hold for now and re-attempt later as appropriate.    Kiran Lapine 05/25/2020, 10:26 AM

## 2020-05-25 NOTE — Progress Notes (Signed)
PT Cancellation Note  Patient Details Name: Joanne [name] MRN: [id number] DOB: [birth year]   Cancelled Treatment:    Reason Eval/Treat Not Completed: Medical issues which prohibited therapy. RN states patient is not able to work with PT this day. Will re-attempt tomorrow if appropriate.    [name] 05/25/2020, 12:51 PM

## 2020-05-25 NOTE — Progress Notes (Signed)
SUBJECTIVE: Patient resting in bed. Patient with worsening lethargy today and is only oriented x1. Son at bedside states he feels her mental status has worsened since this morning. VBG ordered and bedside RN made aware   Vitals:   05/25/20 0102 05/25/20 0520 05/25/20 1110 05/25/20 1123  BP: 103/63 (!) 145/80  116/71  Pulse: 88 (!) 104 (!) 102 (!) 103  Resp: 20 17  20   Temp: 98.6 F (37 C) 98.5 F (36.9 C)  98.7 F (37.1 C)  TempSrc: Oral Oral  Oral  SpO2: 99% 100% 99% 100%  Weight:      Height:        Intake/Output Summary (Last 24 hours) at 05/25/2020 1410 Last data filed at 05/25/2020 0300 Gross per 24 hour  Intake 490 ml  Output --  Net 490 ml    LABS: Basic Metabolic Panel: Recent Labs    05/23/20 0614 05/23/20 0614 05/24/20 0551 05/25/20 0451  NA 141   < > 141 141  K 2.9*   < > 2.3* 4.0  CL 99   < > 101 102  CO2 32   < > 31 29  GLUCOSE 95   < > 89 107*  BUN 22   < > 15 16  CREATININE 0.77   < > 0.49 0.57  CALCIUM 7.8*   < > 7.5* 8.0*  MG 1.3*  --  1.6*  --    < > = values in this interval not displayed.   Liver Function Tests: No results for input(s): AST, ALT, ALKPHOS, BILITOT, PROT, ALBUMIN in the last 72 hours. No results for input(s): LIPASE, AMYLASE in the last 72 hours. CBC: Recent Labs    05/24/20 0551 05/25/20 0451  WBC 7.5 11.2*  HGB 8.1* 8.4*  HCT 26.4* 27.0*  MCV 88.9 88.8  PLT 266 302   Cardiac Enzymes: No results for input(s): CKTOTAL, CKMB, CKMBINDEX, TROPONINI in the last 72 hours. BNP: Invalid input(s): POCBNP D-Dimer: No results for input(s): DDIMER in the last 72 hours. Hemoglobin A1C: No results for input(s): HGBA1C in the last 72 hours. Fasting Lipid Panel: No results for input(s): CHOL, HDL, LDLCALC, TRIG, CHOLHDL, LDLDIRECT in the last 72 hours. Thyroid Function Tests: No results for input(s): TSH, T4TOTAL, T3FREE, THYROIDAB in the last 72 hours.  Invalid input(s): FREET3 Anemia Panel: No results for input(s):  VITAMINB12, FOLATE, FERRITIN, TIBC, IRON, RETICCTPCT in the last 72 hours.   PHYSICAL EXAM General: Well developed, well nourished, in no acute distress HEENT:  Normocephalic and atramatic Neck:  No JVD.  Lungs: Clear bilaterally to auscultation and percussion. Heart: Atrial Fibrillation Abdomen: Bowel sounds are positive, abdomen soft and non-tender  Msk:  Back normal, normal gait. Normal strength and tone for age. Extremities: No clubbing, cyanosis or edema.   Neuro: Lethargic and oriented X 1 Psych:  Good affect, responds appropriately  TELEMETRY: Atrial Fibrillation. 100s/bpm  ASSESSMENT AND PLAN: Patient presenting to the emergency department with shortness of breath.  Patient remains in atrial fibrillation. On assessment rate is 100-110. Plan to continue cardizem dosing, but increase metoprolol tartrate to 37m bid as was only once daily. Continue anticoagulation with Eliquis.  Patient mildly fluid overloaded with history of HFrEF.  Patient states her breathing has improved and would recommend transitioning to her home dose torsemide tomorrow.  Would recommend restarting home dose of lisinopril when able.  We will continue to follow.  Principal Problem:   Acute on chronic respiratory failure with hypoxia and hypercapnia (HCC) Active  Problems:   Benign essential HTN   GERD without esophagitis   Crohn disease ([place])   Depression   Atrial fibrillation with RVR (HCC)   Acute on chronic systolic CHF (congestive heart failure) (HCC)   Severe sepsis (HCC)   Elevated troponin   HCAP (healthcare-associated pneumonia)   Normocytic anemia   Acute exacerbation of CHF (congestive heart failure) ([place])    Adaline Sill, NP-C 05/25/2020 2:10 PM

## 2020-05-25 NOTE — Consult Note (Signed)
Pharmacy Antibiotic Note  Joanne [name] is a 86 y.o. female admitted on 05/22/2020 with healthcare associated pneumonia. Pt presented with shortness of breath and respiratory distress. Pt was recently hospitalized with UTI 7/20-7/29 - received ceftriaxone and vancomycin during admission. PMH includes atrial fibrillation, Crohn's, GERD, HLD, and HTN. . Pharmacy has been consulted for vancomycin and cefepime dosing. Since admission her renal function has been stable  Plan:  1) adjust vancomycin dose to 750 mg IV   Ke: 0.034 h-1, T1/2: 20.1 h  Css (calculated): 26.1/11.8 mcg/mL  vancomycin levels as clinically indicated  Daily SCr while on vancomycin to assess renal function  2) continue cefepime 2 grams IV every 12 hours   Height: 5' (152.4 cm) Weight: 71.2 kg (157 lb) IBW/kg (Calculated) : 45.5  Temp (24hrs), Avg:98.5 F (36.9 C), Min:98.2 F (36.8 C), Max:98.7 F (37.1 C)  Recent Labs  Lab 05/22/20 1018 05/22/20 1111 05/23/20 0614 05/23/20 0941 05/24/20 0551 05/25/20 0451  WBC 25.1*  --  8.2  --  7.5 11.2*  CREATININE 0.75  --  0.77  --  0.49 0.57  LATICACIDVEN 4.3* 3.8*  --  1.9  --   --     Estimated Creatinine Clearance: 44.5 mL/min (by C-G formula based on SCr of 0.57 mg/dL).    Antimicrobials this admission: 9/26 cefepime >> 9/26 vancomycin >>  Microbiology results: 9/26 BCx: NG x 2 days 9/28 RCx: pending 9/26 UCx: E faecalis, pan-sensitive 9/26 MRSA PCR: positive  Thank you for allowing pharmacy to be a part of this patient's care.  Dallie Piles, PharmD 05/25/2020 7:03 AM

## 2020-05-25 NOTE — Evaluation (Signed)
Occupational Therapy Evaluation Patient Details Name: Joanne [name] MRN: [id number] DOB: 03/16/1931 Today's Date: 05/25/2020    History of Present Illness Joanne [name] is a 86 y.o. female who presented to ED with worsening SOB x 1/k. Medical history significant of hypertension, hyperlipidemia, GERD, depression, Fuch's endothelial dystrophy, Crohn's disease, anemia, Zenker's diverticulum, a fib on Eliquis, sCHF, anemia.   Clinical Impression   Pt was seen for OT evaluation this date. Prior to hospital admission, pt was living at a SNF, using a WC for mobility, requiring assistance with most ADLs, but feeding self (according to son) and generally oriented to time, place, person, while displaying occasional confusion. Today, pt is unable to name the month or the year -- asked what year it is, pt replies, "I do know, but I can't tell you." Pt is unable to feed self -- can grasp utensil and move food on plate, but cannot/will not bring fork to mouth. Pt able to use washcloth for UB bathing/grooming, but requires almost constant VCs to attend to task. Pt attempts to wash upper face with washcloth without removing her eyeglasses. Pt able to state her birthdate, but cannot remember birthdates of her three children. With much encouragement, pt agreed to engage is slight rolling movements in bed but refused to attempt sitting. Pt labile, agitated at times and then begins sobbing at various points throughout session. She states, "people think I am crazy." Son reports that his mother has on occasion been refusing to move from bed at her SNF, but that she has not previously displayed this level of confusion. Pt would benefit from skilled OT services while hospitalized to address noted impairments and functional limitations in order to maximize safety, functional mobility, and return to PLOF. Upon hospital discharge, recommend return to SNF.     Follow Up Recommendations  SNF    Equipment Recommendations  None  recommended by OT    Recommendations for Other Services       Precautions / Restrictions Precautions Precautions: Fall Restrictions Weight Bearing Restrictions: No      Mobility Bed Mobility Overal bed mobility: Needs Assistance Bed Mobility: Rolling Rolling: Min assist         General bed mobility comments: Pt able to engage in small movements in bed after given much encourage and VCs. Refuses to attempt supine<>sit  Transfers                 General transfer comment: Pt very reluctant to move at all.    Balance                                           ADL either performed or assessed with clinical judgement   ADL Overall ADL's : Needs assistance/impaired Eating/Feeding: Maximal assistance Eating/Feeding Details (indicate cue type and reason): Pt unable to feed self this AM -- moves food around on plate but cannot bring utensil to mouth.  Son reports that pt feeds self at SNF Grooming: Wash/dry hands;Wash/dry face;Set up;Cueing for sequencing;Supervision/safety Grooming Details (indicate cue type and reason): Able to tend to grooming with multiple verbal and tactile cues for initiation, sequencing. Requires ongoing VCs to continue ADL activity.                                     Vision Baseline  Vision/History: Wears glasses Wears Glasses: At all times       Perception     Praxis      Pertinent Vitals/Pain Pain Assessment: No/denies pain     Hand Dominance     Extremity/Trunk Assessment Upper Extremity Assessment Upper Extremity Assessment: Generalized weakness   Lower Extremity Assessment Lower Extremity Assessment: Generalized weakness       Communication Communication Communication: HOH   Cognition Arousal/Alertness: Lethargic Behavior During Therapy: Flat affect Overall Cognitive Status: Impaired/Different from baseline Area of Impairment: Orientation;Awareness;Problem solving;Following commands                  Orientation Level: Place           Problem Solving: Difficulty sequencing;Decreased initiation;Requires verbal cues;Requires tactile cues General Comments: Pt very disoriented at present. Son in room and reports that this is far different from baseline and that she seems more confused now than she did in ED   General Comments  Extensive bruising on BUE, swelling on LUE    Exercises Other Exercises Other Exercises: Educated pt and family on role of OT; POC; importance of mobility, even if only at bed level. Log rolling, bed mobility, UB bathing/grooming.   Shoulder Instructions      Home Living Family/patient expects to be discharged to:: Skilled nursing facility                                 Additional Comments: Moved from her home to SNF ~ 2 months ago      Prior Functioning/Environment Level of Independence: Needs assistance  Gait / Transfers Assistance Needed: Pt uses a WC for mobility. ADL's / Homemaking Assistance Needed: Pt needs assistance for most ADL   Comments: Multiple falls in the past 6 months. At least 2 falls and 3-4 "slips," including slip out of bed        OT Problem List: Decreased strength;Decreased cognition      OT Treatment/Interventions: Self-care/ADL training;Therapeutic activities;Balance training;Therapeutic exercise;Patient/family education    OT Goals(Current goals can be found in the care plan section) Acute Rehab OT Goals Patient Stated Goal: Pt unable to articulate a goal; son's goal is to get her back to "being herself" OT Goal Formulation: With patient/family Time For Goal Achievement: 06/08/20 Potential to Achieve Goals: Good ADL Goals Pt Will Perform Eating: with set-up;with supervision;bed level Pt/caregiver will Perform Home Exercise Program: Increased strength;With minimal assist (at bed level)  OT Frequency: Min 1X/week   Barriers to D/C:            Co-evaluation               AM-PAC OT "6 Clicks" Daily Activity     Outcome Measure Help from another person eating meals?: A Lot Help from another person taking care of personal grooming?: A Little Help from another person toileting, which includes using toliet, bedpan, or urinal?: A Lot Help from another person bathing (including washing, rinsing, drying)?: A Lot Help from another person to put on and taking off regular upper body clothing?: A Lot Help from another person to put on and taking off regular lower body clothing?: A Lot 6 Click Score: 13   End of Session    Activity Tolerance: Patient limited by lethargy;Patient limited by fatigue;Other (comment);Treatment limited secondary to agitation (Pt labile, vacilating btwn agitated and lethargy.) Patient left: in bed;with call bell/phone within reach;with bed alarm set;with family/visitor present  OT Visit Diagnosis: Other abnormalities of gait and mobility (R26.89);Repeated falls (R29.6);Muscle weakness (generalized) (M62.81)                Time: 5831-6742 OT Time Calculation (min): 20 min Charges:  OT General Charges $OT Visit: 1 Visit OT Evaluation $OT Eval Moderate Complexity: 1 Mod OT Treatments $Self Care/Home Management : 8-22 mins Josiah Lobo, PhD, [place], OTR/L ascom [phone] 05/25/20, 11:31 AM

## 2020-05-25 NOTE — Evaluation (Addendum)
05/26/20,12:20 PMClinical/Bedside Swallow Evaluation Patient Details  Name: Joanne [name] MRN: [id number] Date of Birth: 03-12-1931  Today's Date: 05/25/2020 Time: SLP Start Time (ACUTE ONLY): 0900 SLP Stop Time (ACUTE ONLY): 0945 SLP Time Calculation (min) (ACUTE ONLY): 45 min  Past Medical History:  Past Medical History:  Diagnosis Date  . Anemia, unspecified    B12 and iron deficiency   . Arthritis   . Atrial fibrillation ([place])   . B12 deficiency   . Collagen vascular disease ([place])   . Crohn disease ([place])   . Crohn's disease ([place])    followed by Dr. Vira Agar with serial colonoscopies  . Dyspnea   . Fuchs' endothelial dystrophy   . GERD (gastroesophageal reflux disease)   . History of bone density study 09/01/2012  . History of spinal fracture   . Hyperlipidemia, unspecified   . Hypertension   . Hypokalemia   . Hypomagnesemia   . Osteoporosis   . Other dysphagia 01/24/2017  . Pneumonia    1980's  . Protein calorie malnutrition ([place])   . Retinal detachment   . Right shoulder injury 05/2002   treated conservatively  . Trigger finger   . Zenker diverticulum 09/25/2016   Past Surgical History:  Past Surgical History:  Procedure Laterality Date  . APPENDECTOMY  1949  . CATARACT EXTRACTION EXTRACAPSULAR Right 05/10/2015   Procedure: EXTRACTION CATARACT EXTRACAPSULAR W/INSERTION INTRAOCULAR PROSTHESIS; Surgeon: Doyce Para, MD; [facility]; Service: Ophthalmology; Laterality: Right;  . CHOLECYSTECTOMY  1992  . COLONOSCOPY     03/24/1992, 03/11/1998, 07/08/2002, 05/08/2006, 01/31/2012   PH Crohns disease; no repeat due to age per RTE (dw)  . CORNEAL TRANSPLANT Left 07/08/2012   Procedure: CORNEAL TRANSPLANT DSAEK ** tissue ordered OSI 06/27/2012; Surgeon: Doyce Para, MD; Location: [place]; Service: Ophthalmology;  . ESOPHAGOGASTRODUODENOSCOPY     05/01/2005, 01/31/2012 ; No repeat per RTE  . ESOPHAGOGASTRODUODENOSCOPY (EGD) WITH PROPOFOL N/A 05/30/2018    Procedure: ESOPHAGOGASTRODUODENOSCOPY (EGD) WITH PROPOFOL;  Surgeon: Manya Silvas, MD;  Location: [facility] ENDOSCOPY;  Service: Endoscopy;  Laterality: N/A;  . EYE SURGERY  2012, 2013   5 total  . JOINT REPLACEMENT Right 01-28-13  . KYPHOPLASTY N/A 08/12/2018   Procedure: KYPHOPLASTY L3;  Surgeon: Hessie Knows, MD;  Location: [facility] ORS;  Service: Orthopedics;  Laterality: N/A;  . LENSECTOMY PHACOFRAGMENTATION WITH ASPIRATION Left 07/25/2011  . TONSILLECTOMY  1950  . TOTAL HIP ARTHROPLASTY Left 12/23/2017   Procedure: TOTAL HIP ARTHROPLASTY;  Surgeon: Dereck Leep, MD;  Location: [facility] ORS;  Service: Orthopedics;  Laterality: Left;  Marland Kitchen VITREOUS RETINAL SURGERY Left 11/07/2011   PPV/SO  . VITREOUS RETINAL SURGERY Left 04/18/2011   TPPV/TRP/SO/SB/EL/C3F8   HPI:  Pt is a 86 y.o. female who presented to ED with worsening SOB x 1/k. Medical history significant of hypertension, hyperlipidemia, GERD, depression, Fuch's endothelial dystrophy, Crohn's disease, anemia, Zenker's diverticulum, a fib on Eliquis, sCHF, anemia.    Assessment / Plan / Recommendation Clinical Impression  Pt was seen for bedside swallow eval. Pt presents w/ history of GERD & Zenker's diverticulum (09/25/2016). Per CXR (9/26): Cardiomegaly with pulmonary vascular congestion, LEFT interstitial/airspace disease and new RIGHT interstitial/mild airspace opacities; Probable small bilateral pleural effusions; LEFT LOWER lung consolidation/atelectasis. Unsure of pt's cognitive baseline-- at time of evaluation, pt exhibited various soft signs of cognitive decline/confusion (asking if various staff were her daughter, limited verbal response to questions, scanning the room/environment). Family member reported confusion/cognitive decline in chart note.  During Bedside Swallow Eval, no overt  clinical s/s of aspiration were noted. Natural dentition adequate for mastication. Pt was observed directly w/ PO trials of thin liquids via cup, puree via  spoon, and softened solids.Oral phase appeared to be increased in time for bolus management/mastication likely d/t the pt's apparent cognitive decline. At times when pt's attention to the meal was significantly reduced and verbal cues were inadequate for redirecting pt's attention. Pt accepted presentation of soft-solid boluses from clinician via fork when utensil was prepped. Oral clearing achieved for all bolus consistencies given time d/t inattention. Pt required full assist for upright positioning in bed, full tray set up, intermittent supervision & min verbal/visual cues to redirect attention to meal. Pt expressed physical discomfort w/ positioning & clinician helped to adjust her to reduce discomfort, which appeared to improve her ability to attend to the task. Pt's speech was clear after all PO trials.   Recommend following general aspiration precautions for safer swallowing; Mech soft diet w/ thin liquids; supervision at mealtime and maximally reducing distractions; upright after meals for 30-45 min to reduce risk of reflux & aspiration; meds CRUSHED in apple sauce. NSG & MD updated. SLP to f/u w/ education as needed.     SLP Visit Diagnosis: Dysphagia, oropharyngeal phase (R13.12)    Aspiration Risk  Mild aspiration risk    Diet Recommendation  Mech soft diet w/ thin liquids; supervision at mealtime and maximally reducing distractions; aspiration precautions  Medication Administration: Crushed with puree    Other  Recommendations Oral Care Recommendations: Oral care BID   Follow up Recommendations Skilled Nursing facility      Frequency and Duration   n/a         Prognosis Barriers to Reach Goals: Cognitive deficits Barriers/Prognosis Comment: Confusion/dementia signs      Swallow Study   General HPI: Pt is a 86 y.o. female who presented to ED with worsening SOB x 1/k. Medical history significant of hypertension, hyperlipidemia, GERD, depression, Fuch's endothelial dystrophy,  Crohn's disease, anemia, Zenker's diverticulum, a fib on Eliquis, sCHF, anemia.  Type of Study: Bedside Swallow Evaluation Diet Prior to this Study: Regular;Thin liquids Temperature Spikes Noted: No Respiratory Status: Nasal cannula Behavior/Cognition: Alert;Cooperative;Pleasant mood;Confused;Requires cueing Oral Cavity Assessment: Within Functional Limits Oral Care Completed by SLP: No Oral Cavity - Dentition: Adequate natural dentition Vision: Functional for self-feeding Self-Feeding Abilities: Able to feed self;Needs assist;Needs set up Patient Positioning: Upright in bed Baseline Vocal Quality: Normal    Oral/Motor/Sensory Function Overall Oral Motor/Sensory Function: Within functional limits   Ice Chips     Thin Liquid Thin Liquid: Within functional limits Presentation: Cup;Self Fed    Nectar Thick     Honey Thick     Puree Puree: Within functional limits Presentation: Self Fed;Spoon   Solid     Solid: Within functional limits Presentation: Self Fed;Spoon      Quintella Baton  Graduate Clinician 05/25/2020,12:52 PM   The information in this patient note, response to treatment, and overall treatment plan developed has been reviewed and agreed upon by this clinician.  Orinda Kenner, MS, [place] Speech Language Pathologist Rehab Services [phone]

## 2020-05-25 NOTE — Progress Notes (Signed)
Cross Cover Note Nurse reports son feels patient less responsive than her baseline.  Bedside, son not in room. Nurse reports patient did receive lasix today and is on gabapentin three times a day Patient arouses with name called. She is sleepy but remains awake with questions. Oriented to person, place situation  She endorses she feels sleepy and often gets sleepy at home after her gabapentin.  She is on 300 TID. This has been decreased and nurse to hold dose tonight.  Patient is in agreement

## 2020-05-26 ENCOUNTER — Inpatient Hospital Stay: Payer: Medicare Other

## 2020-05-26 DIAGNOSIS — A419 Sepsis, unspecified organism: Secondary | ICD-10-CM | POA: Diagnosis not present

## 2020-05-26 DIAGNOSIS — R0902 Hypoxemia: Secondary | ICD-10-CM

## 2020-05-26 DIAGNOSIS — J189 Pneumonia, unspecified organism: Secondary | ICD-10-CM | POA: Diagnosis not present

## 2020-05-26 DIAGNOSIS — I509 Heart failure, unspecified: Secondary | ICD-10-CM | POA: Diagnosis not present

## 2020-05-26 DIAGNOSIS — J9622 Acute and chronic respiratory failure with hypercapnia: Secondary | ICD-10-CM | POA: Diagnosis not present

## 2020-05-26 DIAGNOSIS — J9621 Acute and chronic respiratory failure with hypoxia: Secondary | ICD-10-CM | POA: Diagnosis not present

## 2020-05-26 LAB — CULTURE, RESPIRATORY W GRAM STAIN

## 2020-05-26 LAB — BASIC METABOLIC PANEL
Anion gap: 7 (ref 5–15)
BUN: 18 mg/dL (ref 8–23)
CO2: 32 mmol/L (ref 22–32)
Calcium: 8.3 mg/dL — ABNORMAL LOW (ref 8.9–10.3)
Chloride: 100 mmol/L (ref 98–111)
Creatinine, Ser: 0.59 mg/dL (ref 0.44–1.00)
GFR calc Af Amer: >60 mL/min (ref >60)
GFR calc non Af Amer: >60 mL/min (ref >60)
Glucose, Bld: 98 mg/dL (ref 70–99)
Potassium: 4.1 mmol/L (ref 3.5–5.1)
Sodium: 139 mmol/L (ref 135–145)

## 2020-05-26 LAB — CBC
HCT: 27.9 % — ABNORMAL LOW (ref 36.0–46.0)
Hemoglobin: 8.9 g/dL — ABNORMAL LOW (ref 12.0–15.0)
MCH: 28.1 pg (ref 26.0–34.0)
MCHC: 31.9 g/dL (ref 30.0–36.0)
MCV: 88 fL (ref 80.0–100.0)
Platelets: 297 10*3/uL (ref 150–400)
RBC: 3.17 MIL/uL — ABNORMAL LOW (ref 3.87–5.11)
RDW: 15.7 % — ABNORMAL HIGH (ref 11.5–15.5)
WBC: 8.3 10*3/uL (ref 4.0–10.5)
nRBC: 0 % (ref 0.0–0.2)

## 2020-05-26 LAB — MAGNESIUM: Magnesium: 1.8 mg/dL (ref 1.7–2.4)

## 2020-05-26 LAB — AMMONIA: Ammonia: 18 umol/L (ref 9–35)

## 2020-05-26 MED ORDER — MORPHINE SULFATE (CONCENTRATE) 10 MG/0.5ML PO SOLN: 5.0000 mg | ORAL | Status: DC | PRN

## 2020-05-26 MED ORDER — HALOPERIDOL LACTATE 5 MG/ML IJ SOLN: 0.5000 mg | INTRAMUSCULAR | Status: DC | PRN

## 2020-05-26 MED ORDER — BIOTENE DRY MOUTH MT LIQD: 15.0000 mL | OROMUCOSAL | Status: DC | PRN

## 2020-05-26 MED ORDER — HYDROCORTISONE 1 % EX OINT
1.0000 "application " | TOPICAL_OINTMENT | Freq: Three times a day (TID) | CUTANEOUS | Status: DC | PRN
  Filled 2020-05-26: qty 28.35

## 2020-05-26 MED ORDER — GLYCOPYRROLATE 0.2 MG/ML IJ SOLN
0.2000 mg | INTRAMUSCULAR | Status: DC | PRN
  Filled 2020-05-26: qty 1

## 2020-05-26 MED ORDER — POLYVINYL ALCOHOL 1.4 % OP SOLN
1.0000 [drp] | Freq: Four times a day (QID) | OPHTHALMIC | Status: DC | PRN
  Filled 2020-05-26: qty 15

## 2020-05-26 MED ORDER — ZINC OXIDE 40 % EX OINT
TOPICAL_OINTMENT | Freq: Two times a day (BID) | CUTANEOUS | Status: DC | PRN
  Filled 2020-05-26: qty 113

## 2020-05-26 NOTE — Progress Notes (Signed)
PT Cancellation Note  Patient Details Name: Joanne [name] MRN: [id number] DOB: 07/05/1931   Cancelled Treatment:    Reason Eval/Treat Not Completed: Medical issues which prohibited therapy. Per RN, patient declining. Hold PT as she is not appropriate. Will continue to follow and evaluate if/when appropriate.     Brettney Ficken 05/26/2020, 12:21 PM

## 2020-05-26 NOTE — Progress Notes (Signed)
SUBJECTIVE: Patient very lethargic   Vitals:   05/25/20 1123 05/25/20 1915 05/26/20 0442 05/26/20 0500  BP: 116/71 137/82 134/81   Pulse: (!) 103 72 91   Resp: 20 18 16    Temp: 98.7 F (37.1 C) (!) 97.5 F (36.4 C) (!) 97.3 F (36.3 C)   TempSrc: Oral Oral Oral   SpO2: 100% 100% 99%   Weight:    68.1 kg  Height:        Intake/Output Summary (Last 24 hours) at 05/26/2020 0842 Last data filed at 05/26/2020 0300 Gross per 24 hour  Intake 343 ml  Output --  Net 343 ml    LABS: Basic Metabolic Panel: Recent Labs    05/24/20 0551 05/24/20 0551 05/25/20 0451 05/26/20 0611  NA 141   < > 141 139  K 2.3*   < > 4.0 4.1  CL 101   < > 102 100  CO2 31   < > 29 32  GLUCOSE 89   < > 107* 98  BUN 15   < > 16 18  CREATININE 0.49   < > 0.57 0.59  CALCIUM 7.5*   < > 8.0* 8.3*  MG 1.6*  --   --  1.8   < > = values in this interval not displayed.   Liver Function Tests: No results for input(s): AST, ALT, ALKPHOS, BILITOT, PROT, ALBUMIN in the last 72 hours. No results for input(s): LIPASE, AMYLASE in the last 72 hours. CBC: Recent Labs    05/25/20 0451 05/26/20 0611  WBC 11.2* 8.3  HGB 8.4* 8.9*  HCT 27.0* 27.9*  MCV 88.8 88.0  PLT 302 297   Cardiac Enzymes: No results for input(s): CKTOTAL, CKMB, CKMBINDEX, TROPONINI in the last 72 hours. BNP: Invalid input(s): POCBNP D-Dimer: No results for input(s): DDIMER in the last 72 hours. Hemoglobin A1C: No results for input(s): HGBA1C in the last 72 hours. Fasting Lipid Panel: No results for input(s): CHOL, HDL, LDLCALC, TRIG, CHOLHDL, LDLDIRECT in the last 72 hours. Thyroid Function Tests: No results for input(s): TSH, T4TOTAL, T3FREE, THYROIDAB in the last 72 hours.  Invalid input(s): FREET3 Anemia Panel: No results for input(s): VITAMINB12, FOLATE, FERRITIN, TIBC, IRON, RETICCTPCT in the last 72 hours.   PHYSICAL EXAM General: Well developed, well nourished, in no acute distress HEENT:  Normocephalic and  atramatic Neck:  No JVD.  Lungs: Clear bilaterally to auscultation and percussion. Heart: HRRR . Normal S1 and S2 without gallops or murmurs.  Abdomen: Bowel sounds are positive, abdomen soft and non-tender  Msk:  Back normal, normal gait. Normal strength and tone for age. Extremities: No clubbing, cyanosis or edema.   Neuro: Alert and oriented X 3. Psych:  Good affect, responds appropriately  TELEMETRY: A. fib  ASSESSMENT AND PLAN: Remains in A. fib with HFrEF.  Advise starting the patient on lisinopril 5 mg p.o. once a day. Principal Problem:   Acute on chronic respiratory failure with hypoxia and hypercapnia (HCC) Active Problems:   Benign essential HTN   GERD without esophagitis   Crohn disease (HCC)   Depression   Atrial fibrillation with RVR (HCC)   Acute on chronic systolic CHF (congestive heart failure) (HCC)   Severe sepsis (HCC)   Elevated troponin   HCAP (healthcare-associated pneumonia)   Normocytic anemia   Acute exacerbation of CHF (congestive heart failure) (HCC)    Averlee Swartz A, MD, [facility] 05/26/2020 8:42 AM

## 2020-05-26 NOTE — TOC Progression Note (Signed)
Transition of Care [facility]) - Progression Note    Patient Details  Name: Joanne [name] MRN: [id number] Date of Birth: May 25, 1931  Transition of Care [facility]) CM/SW Contact  [name], RN Phone Number: 05/26/2020, 3:12 PM  Clinical Narrative:    Confirmed with daughter [name] that their wishes are for referral to be made to the "[facility] home".  Referral made to [name] with [employer]    Expected Discharge Plan: Skilled Nursing Facility Barriers to Discharge: Continued Medical Work up  Expected Discharge Plan and Services Expected Discharge Plan: [place] In-house Referral: Clinical Social Work     Living arrangements for the past 2 months: [place]                                       Social Determinants of Health (SDOH) Interventions    Readmission Risk Interventions No flowsheet data found.

## 2020-05-26 NOTE — Progress Notes (Signed)
Daily Progress Note   Patient Name: Joanne [name]       Date: 05/26/2020 DOB: [birth year]  Age: 86 y.o. MRN#: [id number] Attending Physician: Enzo Bi, MD Primary Care Physician: Tracie Harrier, MD Admit Date: 05/22/2020  Reason for Consultation/Follow-up: Establishing goals of care  Subjective:  Chart reviewed and updates received from RN. RN reports patient has been confused, crying intermittently, and refusing medications. Appetite poor despite attempts to feed.   Patient awake and alert to self and family. Complains of generalized pain with more focus on sacral area. She will answer some questions appropriately. Continues to stare off and focus on ceiling area or out of the window. Very tearful. States "I am tired". Will intermittently drop head and close eyes as if sleep and will then re-open and again become fixated with ceiling.   Family is at the bedside and called requesting follow up goals of care discussion due to feelings of patient further declining. I met at the bedside with patient's son, daughter Joanne [name] (and her husband), and patient's daughter, Joanne [name] was included via video chat.   Family is tearful expressing patient's continued decline over the past several days. Updates provided. I discussed at length patient's recent scans and labwork which does not show any acute abnormalities or concerns. Family verbalizes understanding. Son shares he has been trying to get patient to eat breakfast and she only took a few bites. Daughter endorses similar concerns prior to admission.   Family all mutually expressed understanding of patient's current illness and co-morbidities. They report patient was not participating much with therapy at [facility] prior to admission and has been bed bound requiring ADL assistance for several months. They speak to her declining quality of life which is most important to them and [name].   Family mutually expressed they would not want to prolong her  life, understanding she may or may not show improvement, and wish to focus on her comfort versus continued medical interventions. Therapeutic listening and support provided.   I discussed at length with family best case and worst case scenario with options that would be possible. We discussed watchful waiting, continuing with PT evaluation with plans for returning to SNF, home with family and home health or hospice, hospice at facility, and residential hospice home.   Family reports their wishes to minimize medications and focus on Ms. Schwan's comfort. If she was to show improvement they feel they would re-evaluate their decisions at that time, however they feel her comfort is what is most important at this stage in her life.   I allowed family to discuss amongst themselves and they confirmed mutually they would like for patient's care to focus on comfort and consideration of residential hospice home placement to continue care.   I discussed at length what comfort care would look like for patient while hospitalized in addition to care at the hospice home if accepted. Education provided on the referral process for hospice. Family verbalized understanding and again confirmed wishes.   I reviewed medications and other interventions with family. They are in agreement to minimize medications however, would like to continue with cardiac medications for comfort and support, also Joanne [name] is currently out of town and will be returning soon and would not want escalation of decline prior to her visiting.   1430: Followed up with family to clarify understanding of patient's condition and plan of care. Updates provided by Dr. Billie Ruddy and family verbalized understanding of delirium, however continues to request plans remain in  place for comfort care and hospice referral.   All questions answered and support provided.   Length of Stay: 4 days  Vital Signs: BP 129/70 (BP Location: Left Arm)   Pulse 89   Temp 98 F  (36.7 C)   Resp 16   Ht 5' (1.524 m)   Wt 68.1 kg   SpO2 98%   BMI 29.32 kg/m  SpO2: SpO2: 98 % O2 Device: O2 Device: Nasal Cannula O2 Flow Rate: O2 Flow Rate (L/min): 2 L/min  Physical Exam: -awake & alert to self and family only, ill-appearing, blank stare upwards at times -normal breathing pattern (3L [place]), dry cough -scattered bruising -will follow some commands        Palliative Care Assessment & Plan  HPI: Palliative Care consult requested for goals of care discussion in this 86 y.o. female with multiple medical problems including hypertension, hyperlipidemia, GERD, depression,Fuch'sendothelial dystrophy, Crohn's disease, anemia, Zenker's diverticulum,a-fib (Eliquis), home oxygen 3L,  sCHF, and anemia.  Patient presented to the ED from [facility] facility with complaints of increased shortness of breath.  It was reported by daughters that patient was receiving antibiotic treatment for possible pneumonia at facility.  During ED work-up patient was found to have atrial fibrillation with RVR (heart rates 1 16-109) with systolic blood pressure in 70s.  WBC 25.  BNP 1598.  Lactic acid 4.3.  Covid PCR negative.  X-ray showed cardiomegaly with pulmonary vascular congestion, left interstitial airspace disease with new right opacities favoring edema over infection.  Left lower lung consolidation. Cardiology is currently following patient with recommendations to continue with IV diuresis, Cardizem, and metoprolol for rate control.  Code Status: DNR  Goals of Care/Recommendations: DNR/DNI Transition care to focus on comfort as requested by family. Will minimize medications and interventions.  Continue with cardiac medications as requested by family for support Morphine PRN for pain/air hunger/comfort Robinul PRN for excessive secretions Zofran PRN for nausea Liquifilm tears PRN for dry eyes Haldol PRN for agitation/anxiety May have comfort feeding Comfort cart for  family Unrestricted visitations in the setting of EOL (per policy) Oxygen PRN 2L or less for comfort. No escalation.   Prognosis: POOR   Discharge Planning: Hospice facility  Thank you for allowing the Palliative Medicine Team to assist in the care of this patient.  Time Total: 65 min.   Visit consisted of counseling and education dealing with the complex and emotionally intense issues of symptom management and palliative care in the setting of serious and potentially life-threatening illness.Greater than 50%  of this time was spent counseling and coordinating care related to the above assessment and plan.  Alda Lea, AGPCNP-BC  Palliative Medicine Team [phone]

## 2020-05-26 NOTE — Progress Notes (Signed)
PROGRESS NOTE    Joanne [name]  NTI:[id number] DOB: Apr 23, 1931 DOA: 05/22/2020 PCP: Tracie Harrier, MD   Brief Narrative: Taken from H&P. Joanne [name] is a 86 y.o. female with medical history significant of hypertension, hyperlipidemia, GERD, depression,Fuch'sendothelial dystrophy, Crohn's disease, anemia, Zenker's diverticulum, a fib on Eliquis, sCHF, anemia, who presents with SOB, initially requiring BiPAP.  Now off the BiPAP and saturating well on her baseline oxygen of 3 to 4 L. Patient was found to be in A. fib with RVR and hypotensive. She is Dr. Bethanne Ginger patient, and Dr. Chancy Milroy who was covering [facility] cardiology was consulted and advised to give her digoxin. Patient received 500 cc bolus in ED with improvement in her blood pressure. On arrival she had leukocytosis at 25,BNP 1598, lactic acid 4.3, 3.8, INR 1.01, PTT 31, troponin 39, negative Covid PCR, electrolytes renal function okay, temperature normal, tachypnea with RR 29.  Procalcitonin of 1.48. Chest x-ray with cardiomegaly and pulmonary vascular congestion.LEFT interstitial/airspace disease and new RIGHT interstitial/mild airspace opacities, favor edema over infection. 2. Probable small bilateral pleural effusions. 3. LEFT LOWER lung consolidation/atelectasis.  She was started on antibiotics for a possible pneumonia and gentle diuresis.  Subjective: Family this morning contacted palliative provider directly to ask for evaluation because pt was much worse mentally and not eating much.  After discussion with palliative care provider, family made pt comfort care, and pt referred to hospice.   Assessment & Plan:   Principal Problem:   Acute on chronic respiratory failure with hypoxia and hypercapnia (HCC) Active Problems:   Benign essential HTN   GERD without esophagitis   Crohn disease (HCC)   Depression   Atrial fibrillation with RVR (HCC)   Acute on chronic systolic CHF (congestive heart failure) (HCC)   Severe  sepsis (HCC)   Elevated troponin   HCAP (healthcare-associated pneumonia)   Normocytic anemia   Acute exacerbation of CHF (congestive heart failure) ([place])  # Comfort care status --initiated today 05/26/20  # AMS increased somnolence # Possible hospital delirium  --acute onset late morning today.  Son also concerned about increased confusion.  No focal --venous blood gas unremarkable, ammonia level wnl.  Acute on chronic respiratory failure with hypoxia and hypercapnia due to acute on chronic systolic CHF exacerbation and possible HCAP. Patient appears clinically volume up with 1% lower extremity edema and significant edema surrounding her buttock area. Patient is incontinent and uses diaper-unable to calculate output. Clinically seems improving as she was saturating well on her baseline oxygen of 3 to 4 L per daughter.Echocardiogram with EF of 40 to 45% which is an improvement from her prior echo done in July 2021 where EF was 30 to 35%, global hypokinesis and IVC with more than 50% collapsibility.  Blood cultures negative.  Urine cultures with 20,000 colonies of Enterococcus faecalis with good sensitivity.  She was started on cefepime and vancomycin. Repeat chest x-ray this morning with persistent pulmonary vascular congestion and a possible left lower lobe infiltrate. --received 4 days of vanc/cefe before comfort care, no further abx. -Continue supplemental oxygen for comfort  Severe sepsis due to possible HCAP (healthcare-associated pneumonia): Patient meets criteria for severe sepsis with leukocytosis, tachycardia, tachypnea with heart rate of 170, also has hypotension.  Lactic acid is elevated 4.3 -->3.8>>.1.9.  Patient has received 500 cc of normal saline, will not give more IV fluid due to fluid overload.  Leukocytosis resolved.  Lactic acidosis resolved.  Blood cultures negative in 24-hour, UA with pyuria, urine culture with 20,000  colonies of Enterococcus faecalis with good  sensitivity.  Procalcitonin at 1.48. --received 4 days of vanc/cefe before comfort care, no further abx  Lower extremity cellulitis.  Lower extremities with edema and erythema, left more than right.  Concerning for cellulitis.  No hyperthermia. --received 4 days of vanc/cefe before comfort care, no further abx  Hypokalemia.  Patient has an history of chronic hypokalemia and hypomagnesemia secondary to persistent GI losses with her Crohn's disease.  --stop lab checks 2/2 comfort care  Elevated troponin.  No chest pain.  Peaked at 53, now trending down.  Most likely secondary to demand ischemia.  A. fib with RVR.  Dr. Humphrey Rolls was consulted over the weekend and they advised giving her a dose of digoxin. Heart rate improved heart rate stays in the 90s. --continue cardizem, lopressor and Eliquis, per family wish --d/c tele  Essential hypertension.  Blood pressure within goal today. --continue cardizem and Lopressor  GERD without esophagitis -d/c Protonix  Crohn disease ([place]) -Continue budesonide, per family wish  Depression -d/c home medications  Normocytic anemia: Hemoglobin dropped to 8.1 today, no obvious bleeding but patient is on Eliquis. --d/c lab checks    Objective: Vitals:   05/25/20 1915 05/26/20 0442 05/26/20 0500 05/26/20 1551  BP: 137/82 134/81  129/70  Pulse: 72 91  89  Resp: 18 16  16   Temp: (!) 97.5 F (36.4 C) (!) 97.3 F (36.3 C)  98 F (36.7 C)  TempSrc: Oral Oral    SpO2: 100% 99%  98%  Weight:   68.1 kg   Height:        Intake/Output Summary (Last 24 hours) at 05/26/2020 1842 Last data filed at 05/26/2020 1414 Gross per 24 hour  Intake 490 ml  Output --  Net 490 ml   Filed Weights   05/22/20 1012 05/26/20 0500  Weight: 71.2 kg 68.1 kg    Examination:  Constitutional: NAD, alert, not talking, holding her family's hands HEENT: conjunctivae and lids normal, EOMI CV: No cyanosis.   RESP: normal respiratory effort Extremities: No effusions,  edema in BLE SKIN: warm, dry and intact Neuro: II - XII grossly intact.     DVT prophylaxis: DU:KGURKYH Code Status: DNR  Family Communication: son and daughter updated at the bedside Status is: inpatient Dispo:   The patient is from: SNF Anticipated d/c is to: hospice Anticipated d/c date is: whenever bed available Patient currently is not medically stable to d/c due to: waiting on hospice bed    Consultants:   Cardiology  Procedures:  Antimicrobials:  Cefepime Vancomycin.  Data Reviewed: I have personally reviewed following labs and imaging studies  CBC: Recent Labs  Lab 05/22/20 1018 05/23/20 0614 05/24/20 0551 05/25/20 0451 05/26/20 0611  WBC 25.1* 8.2 7.5 11.2* 8.3  NEUTROABS 22.4*  --   --   --   --   HGB 9.4* 8.4* 8.1* 8.4* 8.9*  HCT 31.2* 25.5* 26.4* 27.0* 27.9*  MCV 92.0 87.0 88.9 88.8 88.0  PLT 354 263 266 302 062   Basic Metabolic Panel: Recent Labs  Lab 05/22/20 1018 05/23/20 0614 05/24/20 0551 05/25/20 0451 05/26/20 0611  NA 140 141 141 141 139  K 3.5 2.9* 2.3* 4.0 4.1  CL 96* 99 101 102 100  CO2 29 32 31 29 32  GLUCOSE 185* 95 89 107* 98  BUN 20 22 15 16 18   CREATININE 0.75 0.77 0.49 0.57 0.59  CALCIUM 8.4* 7.8* 7.5* 8.0* 8.3*  MG  --  1.3* 1.6*  --  1.8   GFR: Estimated Creatinine Clearance: 43.4 mL/min (by C-G formula based on SCr of 0.59 mg/dL). Liver Function Tests: Recent Labs  Lab 05/22/20 1018  AST 36  ALT 29  ALKPHOS 102  BILITOT 0.6  PROT 5.5*  ALBUMIN 2.5*   No results for input(s): LIPASE, AMYLASE in the last 168 hours. Recent Labs  Lab 05/26/20 1119  AMMONIA 18   Coagulation Profile: Recent Labs  Lab 05/22/20 1018  INR 1.1   Cardiac Enzymes: No results for input(s): CKTOTAL, CKMB, CKMBINDEX, TROPONINI in the last 168 hours. BNP (last 3 results) No results for input(s): PROBNP in the last 8760 hours. HbA1C: No results for input(s): HGBA1C in the last 72 hours. CBG: No results for input(s): GLUCAP in  the last 168 hours. Lipid Profile: No results for input(s): CHOL, HDL, LDLCALC, TRIG, CHOLHDL, LDLDIRECT in the last 72 hours. Thyroid Function Tests: No results for input(s): TSH, T4TOTAL, FREET4, T3FREE, THYROIDAB in the last 72 hours. Anemia Panel: No results for input(s): VITAMINB12, FOLATE, FERRITIN, TIBC, IRON, RETICCTPCT in the last 72 hours. Sepsis Labs: Recent Labs  Lab 05/22/20 1018 05/22/20 1111 05/22/20 1435 05/23/20 0941 05/25/20 0451  PROCALCITON  --   --  1.48  --  1.07  LATICACIDVEN 4.3* 3.8*  --  1.9  --     Recent Results (from the past 240 hour(s))  Blood culture (single)     Status: None (Preliminary result)   Collection Time: 05/22/20 10:19 AM   Specimen: BLOOD  Result Value Ref Range Status   Specimen Description BLOOD LEFT ARM  Final   Special Requests   Final    BOTTLES DRAWN AEROBIC AND ANAEROBIC Blood Culture adequate volume   Culture   Final    NO GROWTH 4 DAYS Performed at [facility], [address], [place], [place] [address]    Report Status PENDING  Incomplete  Respiratory Panel by RT PCR (Flu A&B, Covid) - Nasopharyngeal Swab     Status: None   Collection Time: 05/22/20 10:19 AM   Specimen: Nasopharyngeal Swab  Result Value Ref Range Status   SARS Coronavirus 2 by RT PCR NEGATIVE NEGATIVE Final    Comment: (NOTE) SARS-CoV-2 target nucleic acids are NOT DETECTED.  The SARS-CoV-2 RNA is generally detectable in upper respiratoy specimens during the acute phase of infection. The lowest concentration of SARS-CoV-2 viral copies this assay can detect is 131 copies/mL. A negative result does not preclude SARS-Cov-2 infection and should not be used as the sole basis for treatment or other patient management decisions. A negative result may occur with  improper specimen collection/handling, submission of specimen other than nasopharyngeal swab, presence of viral mutation(s) within the areas targeted by this assay, and inadequate number  of viral copies (<131 copies/mL). A negative result must be combined with clinical observations, patient history, and epidemiological information. The expected result is Negative.  Fact Sheet for Patients:  PinkCheek.be  Fact Sheet for Healthcare Providers:  GravelBags.it  This test is no t yet approved or cleared by the [place] FDA and  has been authorized for detection and/or diagnosis of SARS-CoV-2 by FDA under an Emergency Use Authorization (EUA). This EUA will remain  in effect (meaning this test can be used) for the duration of the COVID-19 declaration under Section 564(b)(1) of the Act, 21 U.S.C. section 360bbb-3(b)(1), unless the authorization is terminated or revoked sooner.     Influenza A by PCR NEGATIVE NEGATIVE Final   Influenza B by PCR NEGATIVE  NEGATIVE Final    Comment: (NOTE) The Xpert Xpress SARS-CoV-2/FLU/RSV assay is intended as an aid in  the diagnosis of influenza from Nasopharyngeal swab specimens and  should not be used as a sole basis for treatment. Nasal washings and  aspirates are unacceptable for Xpert Xpress SARS-CoV-2/FLU/RSV  testing.  Fact Sheet for Patients: PinkCheek.be  Fact Sheet for Healthcare Providers: GravelBags.it  This test is not yet approved or cleared by the [place] FDA and  has been authorized for detection and/or diagnosis of SARS-CoV-2 by  FDA under an Emergency Use Authorization (EUA). This EUA will remain  in effect (meaning this test can be used) for the duration of the  Covid-19 declaration under Section 564(b)(1) of the Act, 21  U.S.C. section 360bbb-3(b)(1), unless the authorization is  terminated or revoked. Performed at [facility], [address] [place], [place] [address]   Urine culture     Status: Abnormal   Collection Time: 05/22/20  4:18 PM   Specimen: Urine, Random    Result Value Ref Range Status   Specimen Description   Final    URINE, RANDOM Performed at [facility], [address] [place], [place] [address]    Special Requests   Final    NONE Performed at [facility], [address] [place], [place] [address]    Culture 20,000 COLONIES/mL ENTEROCOCCUS FAECALIS (A)  Final   Report Status 05/24/2020 FINAL  Final   Organism ID, Bacteria ENTEROCOCCUS FAECALIS (A)  Final      Susceptibility   Enterococcus faecalis - MIC*    AMPICILLIN <=2 SENSITIVE Sensitive     NITROFURANTOIN <=16 SENSITIVE Sensitive     VANCOMYCIN 1 SENSITIVE Sensitive     * 20,000 COLONIES/mL ENTEROCOCCUS FAECALIS  Culture, blood (single) w Reflex to ID Panel     Status: None (Preliminary result)   Collection Time: 05/23/20  6:14 AM   Specimen: BLOOD  Result Value Ref Range Status   Specimen Description BLOOD LFA  Final   Special Requests   Final    BOTTLES DRAWN AEROBIC AND ANAEROBIC Blood Culture adequate volume   Culture   Final    NO GROWTH 3 DAYS Performed at [facility], [address] [place], [place] [address]    Report Status PENDING  Incomplete  MRSA PCR Screening     Status: Abnormal   Collection Time: 05/23/20  8:16 AM   Specimen: Nasopharyngeal  Result Value Ref Range Status   MRSA by PCR POSITIVE (A) NEGATIVE Final    Comment:        The GeneXpert MRSA Assay (FDA approved for NASAL specimens only), is one component of a comprehensive MRSA colonization surveillance program. It is not intended to diagnose MRSA infection nor to guide or monitor treatment for MRSA infections. RESULT CALLED TO, READ BACK BY AND VERIFIED WITH: ANNIE SMITH AT 2956 ON 05/23/2020 [place]. Performed at [facility], [address] [place], [place] [address]   Culture, sputum-assessment     Status: None   Collection Time: 05/24/20  8:58 AM   Specimen: Sputum  Result Value Ref Range Status   Specimen Description SPUTUM  Final    Special Requests NONE  Final   Sputum evaluation   Final    THIS SPECIMEN IS ACCEPTABLE FOR SPUTUM CULTURE Performed at [facility], [address], [place], [place] [address]    Report Status 05/24/2020 FINAL  Final  Culture, respiratory     Status:  None   Collection Time: 05/24/20  8:58 AM   Specimen: SPU  Result Value Ref Range Status   Specimen Description   Final    SPUTUM Performed at [facility], [address] [place], [place] [address]    Special Requests   Final    NONE Reflexed from [id number] Performed at [facility], [address] [place],  [address]    Gram Stain   Final    MODERATE WBC PRESENT, PREDOMINANTLY PMN MODERATE SQUAMOUS EPITHELIAL CELLS PRESENT FEW GRAM POSITIVE COCCI IN CLUSTERS RARE YEAST Performed at [facility], [place] [address], [place],  [address]    Culture   Final    MODERATE METHICILLIN RESISTANT STAPHYLOCOCCUS AUREUS   Report Status 05/26/2020 FINAL  Final   Organism ID, Bacteria METHICILLIN RESISTANT STAPHYLOCOCCUS AUREUS  Final      Susceptibility   Methicillin resistant staphylococcus aureus - MIC*    CIPROFLOXACIN >=8 RESISTANT Resistant     ERYTHROMYCIN >=8 RESISTANT Resistant     GENTAMICIN <=0.5 SENSITIVE Sensitive     OXACILLIN >=4 RESISTANT Resistant     TETRACYCLINE <=1 SENSITIVE Sensitive     VANCOMYCIN 1 SENSITIVE Sensitive     TRIMETH/SULFA >=320 RESISTANT Resistant     CLINDAMYCIN <=0.25 SENSITIVE Sensitive     RIFAMPIN <=0.5 SENSITIVE Sensitive     Inducible Clindamycin NEGATIVE Sensitive     * MODERATE METHICILLIN RESISTANT STAPHYLOCOCCUS AUREUS     Radiology Studies: CT HEAD WO CONTRAST  Result Date: 05/26/2020 CLINICAL DATA:  Altered mental status. EXAM: CT HEAD WITHOUT CONTRAST TECHNIQUE: Contiguous axial images were obtained from the base of the skull through the vertex without intravenous contrast. COMPARISON:  March 15, 2020. FINDINGS: Brain: Mild diffuse cortical  atrophy is noted. Mild chronic ischemic white matter disease is noted. No mass effect or midline shift is noted. Ventricular size is within normal limits. There is no evidence of mass lesion, hemorrhage or acute infarction. Vascular: No hyperdense vessel or unexpected calcification. Skull: Normal. Negative for fracture or focal lesion. Sinuses/Orbits: No acute finding. Other: None. IMPRESSION: Mild diffuse cortical atrophy. Mild chronic ischemic white matter disease. No acute intracranial abnormality seen. Electronically Signed   By: Marijo Conception M.D.   On: 05/26/2020 10:03    Scheduled Meds: . apixaban  5 mg Oral BID  . budesonide  9 mg Oral Daily  . Chlorhexidine Gluconate Cloth  6 each Topical Q0600  . diltiazem  120 mg Oral Daily  . metoprolol tartrate  50 mg Oral BID  . mupirocin ointment  1 application Nasal BID   Continuous Infusions:    LOS: 4 days    Enzo Bi, MD [facility]  If 7PM-7AM, please contact night-coverage [email or web address]  05/26/2020, 6:42 PM

## 2020-05-26 NOTE — Progress Notes (Signed)
[facility] Liaison note:  New referral for [employer] home received from [place]. Patient information given to referral. Hospice home eligibility pending.  Writer spoke via telephone to patient's daughter Joanne [name] to acknowledge the referral.  Joanne [name] and hospital care team all aware that [facility] has nor bed availlability today. Writer to meet with Joanne [name] and her sister in th room tomorrow to discuss hospice home services. Writer's contact number given to [place]. Thank you for this referral.  Flo Shanks BSN, RN, [place] [phone]

## 2020-05-26 NOTE — Progress Notes (Signed)
   05/26/20 0315  Clinical Encounter Type  Visited With Patient and family together  Visit Type Follow-up;Spiritual support;Social support  Referral From Family  Consult/Referral To Chaplain  Visited Pt and family per request from family. Pt was alert and talking. Pt son was in the room. I could tell they had a close bond. Son was talking about Pt not being able to speak and she popped him on his hand then acted like she was mad. It was the cutes thing I have ever seen. I prayed for the Pt and her son. Ch will follow-up as needed.

## 2020-05-27 DIAGNOSIS — J9622 Acute and chronic respiratory failure with hypercapnia: Secondary | ICD-10-CM | POA: Diagnosis not present

## 2020-05-27 DIAGNOSIS — J9621 Acute and chronic respiratory failure with hypoxia: Secondary | ICD-10-CM | POA: Diagnosis not present

## 2020-05-27 LAB — CULTURE, BLOOD (SINGLE)
Culture: NO GROWTH
Special Requests: ADEQUATE

## 2020-05-27 NOTE — Care Management Important Message (Signed)
Important Message  Patient Details  Name: Joanne [name] MRN: [id number] Date of Birth: 04-Mar-1931   Medicare Important Message Given:  Yes  Reviewed with daughter, [name] via phone.  Confirmed they still have copy of Medicare IM in patient's room.   [name] 05/27/2020, 2:12 PM

## 2020-05-27 NOTE — TOC Progression Note (Signed)
Transition of Care [facility]) - Progression Note    Patient Details  Name: Joanne [name] MRN: [id number] Date of Birth: [birth year]  Transition of Care [facility] CM/SW Contact  Shade Flood, LCSW Phone Number: 05/27/2020, 11:38 AM  Clinical Narrative:     TOC following. Per RN, hospice staff here this AM meeting with family. RN was informed that there is not a bed available for pt at [facility] today. TOC will follow.  Expected Discharge Plan: [place] Barriers to Discharge: Continued Medical Work up  Expected Discharge Plan and Services Expected Discharge Plan: [place] In-house Referral: Clinical Social Work     Living arrangements for the past 2 months: [place]                                       Social Determinants of Health (SDOH) Interventions    Readmission Risk Interventions No flowsheet data found.

## 2020-05-27 NOTE — Progress Notes (Signed)
[facility]ufacturing [facility] hospital Liaison note:  Follow up visit made to new referral for [facility] hospice home. Hospice home eligibility has been confirmed. Writer spoke this morning at bedside with patient's daughter [name], son Shanon Brow and daughter Olegario Shearer was on speaker phone to initiate education regarding hospice home services, philosophy, team approach to care and current visitation policy. Questions answered.  Patient was alert at times, but gave no verbal response to questions form Probation officer or her family. Shanon Brow reports she ate a few bites of grits for him earlier, but he had to remove eggs from her mouth when she did swallow them.  [name] and staff RN report increased difficulty with patient taking/swallowing medications. Emotional support given.  Family and hospital care team are aware that [facility] does not have any bed availability today, and possibly none over the weekend.  If a bed does become available the hospice home staff will contact the weekend TOC. Thank you for the opportunity to be involved in the care of this patient and her family.  Flo Shanks BSN, RN, [place] [phone]

## 2020-05-27 NOTE — Progress Notes (Signed)
SUBJECTIVE: Patient is comfortable   Vitals:   05/26/20 0500 05/26/20 1551 05/26/20 2110 05/27/20 0814  BP:  129/70 134/71 140/62  Pulse:  89 89 99  Resp:  16 20 20   Temp:  98 F (36.7 C) 98.7 F (37.1 C) (!) 96.3 F (35.7 C)  TempSrc:   Oral Axillary  SpO2:  98% 99% 99%  Weight: 68.1 kg     Height:        Intake/Output Summary (Last 24 hours) at 05/27/2020 0846 Last data filed at 05/26/2020 2120 Gross per 24 hour  Intake 480 ml  Output --  Net 480 ml    LABS: Basic Metabolic Panel: Recent Labs    05/25/20 0451 05/26/20 0611  NA 141 139  K 4.0 4.1  CL 102 100  CO2 29 32  GLUCOSE 107* 98  BUN 16 18  CREATININE 0.57 0.59  CALCIUM 8.0* 8.3*  MG  --  1.8   Liver Function Tests: No results for input(s): AST, ALT, ALKPHOS, BILITOT, PROT, ALBUMIN in the last 72 hours. No results for input(s): LIPASE, AMYLASE in the last 72 hours. CBC: Recent Labs    05/25/20 0451 05/26/20 0611  WBC 11.2* 8.3  HGB 8.4* 8.9*  HCT 27.0* 27.9*  MCV 88.8 88.0  PLT 302 297   Cardiac Enzymes: No results for input(s): CKTOTAL, CKMB, CKMBINDEX, TROPONINI in the last 72 hours. BNP: Invalid input(s): POCBNP D-Dimer: No results for input(s): DDIMER in the last 72 hours. Hemoglobin A1C: No results for input(s): HGBA1C in the last 72 hours. Fasting Lipid Panel: No results for input(s): CHOL, HDL, LDLCALC, TRIG, CHOLHDL, LDLDIRECT in the last 72 hours. Thyroid Function Tests: No results for input(s): TSH, T4TOTAL, T3FREE, THYROIDAB in the last 72 hours.  Invalid input(s): FREET3 Anemia Panel: No results for input(s): VITAMINB12, FOLATE, FERRITIN, TIBC, IRON, RETICCTPCT in the last 72 hours.   PHYSICAL EXAM General: Well developed, well nourished, in no acute distress HEENT:  Normocephalic and atramatic Neck:  No JVD.  Lungs: Clear bilaterally to auscultation and percussion. Heart: HRRR . Normal S1 and S2 without gallops or murmurs.  Abdomen: Bowel sounds are positive, abdomen  soft and non-tender  Msk:  Back normal, normal gait. Normal strength and tone for age. Extremities: No clubbing, cyanosis or edema.   Neuro: Alert and oriented X 3. Psych:  Good affect, responds appropriately  TELEMETRY: Atrial fibrillation  ASSESSMENT AND PLAN: Congestive heart failure atrial fibrillation currently ventricular rate is well controlled.  Elevated troponin due to demand ischemia.  Principal Problem:   Acute on chronic respiratory failure with hypoxia and hypercapnia (HCC) Active Problems:   Benign essential HTN   GERD without esophagitis   Crohn disease (HCC)   Depression   Atrial fibrillation with RVR (HCC)   Acute on chronic systolic CHF (congestive heart failure) (HCC)   Severe sepsis (HCC)   Elevated troponin   HCAP (healthcare-associated pneumonia)   Normocytic anemia   Acute exacerbation of CHF (congestive heart failure) (HCC)    Joanne [name] A, MD, [facility] 05/27/2020 8:46 AM

## 2020-05-28 DIAGNOSIS — J9621 Acute and chronic respiratory failure with hypoxia: Secondary | ICD-10-CM | POA: Diagnosis not present

## 2020-05-28 DIAGNOSIS — J9622 Acute and chronic respiratory failure with hypercapnia: Secondary | ICD-10-CM | POA: Diagnosis not present

## 2020-05-28 LAB — CULTURE, BLOOD (SINGLE)
Culture: NO GROWTH
Special Requests: ADEQUATE

## 2020-05-28 NOTE — Progress Notes (Addendum)
PROGRESS NOTE    Joanne [name]  BWG:[id number] DOB: 03-Mar-1931 DOA: 05/22/2020 PCP: Tracie Harrier, MD   Brief Narrative: Taken from H&P. Joanne [name] is a 86 y.o. female with medical history significant of hypertension, hyperlipidemia, GERD, depression,Fuch'sendothelial dystrophy, Crohn's disease, anemia, Zenker's diverticulum, a fib on Eliquis, sCHF, anemia, who presents with SOB, initially requiring BiPAP.  Now off the BiPAP and saturating well on her baseline oxygen of 3 to 4 L. Patient was found to be in A. fib with RVR and hypotensive. She is Dr. Bethanne Ginger patient, and Dr. Chancy Milroy who was covering [facility] cardiology was consulted and advised to give her digoxin. Patient received 500 cc bolus in ED with improvement in her blood pressure. On arrival she had leukocytosis at 25,BNP 1598, lactic acid 4.3, 3.8, INR 1.01, PTT 31, troponin 39, negative Covid PCR, electrolytes renal function okay, temperature normal, tachypnea with RR 29.  Procalcitonin of 1.48. Chest x-ray with cardiomegaly and pulmonary vascular congestion.LEFT interstitial/airspace disease and new RIGHT interstitial/mild airspace opacities, favor edema over infection. 2. Probable small bilateral pleural effusions. 3. LEFT LOWER lung consolidation/atelectasis.  She was started on antibiotics for a possible pneumonia and gentle diuresis.  Subjective: Family continued to be very concerned about pt not eating.  Pt took her pills and a few bites of foods.  Diet changed to regular, for pt to have more choices.    Waiting on bed in hospice.   Assessment & Plan:   Principal Problem:   Acute on chronic respiratory failure with hypoxia and hypercapnia (HCC) Active Problems:   Benign essential HTN   GERD without esophagitis   Crohn disease (HCC)   Depression   Atrial fibrillation with RVR (HCC)   Acute on chronic systolic CHF (congestive heart failure) (HCC)   Severe sepsis (HCC)   Elevated troponin   HCAP  (healthcare-associated pneumonia)   Normocytic anemia   Acute exacerbation of CHF (congestive heart failure) ([place])  # Comfort care status --initiated 05/26/20  # AMS increased somnolence # Possible hospital delirium  --acute onset late morning 9/29, per son.  Son also concerned about increased confusion.  No focal neurological deficits  --venous blood gas unremarkable, ammonia level wnl.  Acute on chronic respiratory failure with hypoxia and hypercapnia due to acute on chronic systolic CHF exacerbation and possible HCAP. Patient appears clinically volume up with 1% lower extremity edema and significant edema surrounding her buttock area. Patient is incontinent and uses diaper-unable to calculate output. Clinically seems improving as she was saturating well on her baseline oxygen of 3 to 4 L per daughter.Echocardiogram with EF of 40 to 45% which is an improvement from her prior echo done in July 2021 where EF was 30 to 35%, global hypokinesis and IVC with more than 50% collapsibility.  Blood cultures negative.  Urine cultures with 20,000 colonies of Enterococcus faecalis with good sensitivity.  She was started on cefepime and vancomycin. Repeat chest x-ray this morning with persistent pulmonary vascular congestion and a possible left lower lobe infiltrate. --received 4 days of vanc/cefe before comfort care, no further abx. -Continue supplemental oxygen for comfort  Severe sepsis due to possible HCAP (healthcare-associated pneumonia): Patient meets criteria for severe sepsis with leukocytosis, tachycardia, tachypnea with heart rate of 170, also has hypotension.  Lactic acid is elevated 4.3 -->3.8>>.1.9.  Patient has received 500 cc of normal saline, will not give more IV fluid due to fluid overload.  Leukocytosis resolved.  Lactic acidosis resolved.  Blood cultures negative in 24-hour, UA  with pyuria, urine culture with 20,000 colonies of Enterococcus faecalis with good sensitivity.  Procalcitonin  at 1.48. --received 4 days of vanc/cefe before comfort care, no further abx  Lower extremity cellulitis.  Lower extremities with edema and erythema, left more than right.  Concerning for cellulitis.  No hyperthermia. --received 4 days of vanc/cefe before comfort care, no further abx  Hypokalemia.  Patient has an history of chronic hypokalemia and hypomagnesemia secondary to persistent GI losses with her Crohn's disease.  --no lab checks 2/2 comfort care  Elevated troponin.  No chest pain.  Peaked at 70, now trending down.  Most likely secondary to demand ischemia.  A. fib with RVR.  Dr. Humphrey Rolls was consulted over the weekend and they advised giving her a dose of digoxin. Heart rate improved heart rate stays in the 90s. --continue cardizem, lopressor and Eliquis, per family wish  Essential hypertension.  Blood pressure within goal today. --continue cardizem and Lopressor  GERD without esophagitis -Protonix d/c'ed  Crohn disease ([place]) -Continue budesonide, per family wish  Depression -home medications d/c'ed  Normocytic anemia: Hemoglobin dropped to 8.1 today, no obvious bleeding but patient is on Eliquis. --no lab checks    Objective: Vitals:   05/26/20 2110 05/27/20 0814 05/27/20 1724 05/28/20 0418  BP: 134/71 140/62 138/72 124/72  Pulse: 89 99 83 83  Resp: 20 20 18 16   Temp: 98.7 F (37.1 C) (!) 96.3 F (35.7 C) (!) 97.5 F (36.4 C) (!) 97.5 F (36.4 C)  TempSrc: Oral Axillary Oral Oral  SpO2: 99% 99% 97% 100%  Weight:      Height:       No intake or output data in the 24 hours ending 05/28/20 1754 Filed Weights   05/22/20 1012 05/26/20 0500  Weight: 71.2 kg 68.1 kg    Examination:  Constitutional: NAD, alert, not talking, holding her family's hands, teary today HEENT: conjunctivae and lids normal, EOMI CV: No cyanosis.   RESP: normal respiratory effort Extremities: No effusions, edema in BLE SKIN: warm, dry and intact Neuro: II - XII grossly intact.      DVT prophylaxis: VC:BSWHQPR Code Status: DNR  Family Communication: son and daughters updated at the bedside Status is: inpatient Dispo:   The patient is from: SNF Anticipated d/c is to: hospice Anticipated d/c date is: whenever bed available Patient currently is not medically stable to d/c due to: waiting on hospice bed    Consultants:   Cardiology  Procedures:  Antimicrobials:  Cefepime Vancomycin.  Data Reviewed: I have personally reviewed following labs and imaging studies  CBC: Recent Labs  Lab 05/22/20 1018 05/23/20 0614 05/24/20 0551 05/25/20 0451 05/26/20 0611  WBC 25.1* 8.2 7.5 11.2* 8.3  NEUTROABS 22.4*  --   --   --   --   HGB 9.4* 8.4* 8.1* 8.4* 8.9*  HCT 31.2* 25.5* 26.4* 27.0* 27.9*  MCV 92.0 87.0 88.9 88.8 88.0  PLT 354 263 266 302 916   Basic Metabolic Panel: Recent Labs  Lab 05/22/20 1018 05/23/20 0614 05/24/20 0551 05/25/20 0451 05/26/20 0611  NA 140 141 141 141 139  K 3.5 2.9* 2.3* 4.0 4.1  CL 96* 99 101 102 100  CO2 29 32 31 29 32  GLUCOSE 185* 95 89 107* 98  BUN 20 22 15 16 18   CREATININE 0.75 0.77 0.49 0.57 0.59  CALCIUM 8.4* 7.8* 7.5* 8.0* 8.3*  MG  --  1.3* 1.6*  --  1.8   GFR: Estimated Creatinine Clearance: 43.4 mL/min (  by C-G formula based on SCr of 0.59 mg/dL). Liver Function Tests: Recent Labs  Lab 05/22/20 1018  AST 36  ALT 29  ALKPHOS 102  BILITOT 0.6  PROT 5.5*  ALBUMIN 2.5*   No results for input(s): LIPASE, AMYLASE in the last 168 hours. Recent Labs  Lab 05/26/20 1119  AMMONIA 18   Coagulation Profile: Recent Labs  Lab 05/22/20 1018  INR 1.1   Cardiac Enzymes: No results for input(s): CKTOTAL, CKMB, CKMBINDEX, TROPONINI in the last 168 hours. BNP (last 3 results) No results for input(s): PROBNP in the last 8760 hours. HbA1C: No results for input(s): HGBA1C in the last 72 hours. CBG: No results for input(s): GLUCAP in the last 168 hours. Lipid Profile: No results for input(s): CHOL, HDL,  LDLCALC, TRIG, CHOLHDL, LDLDIRECT in the last 72 hours. Thyroid Function Tests: No results for input(s): TSH, T4TOTAL, FREET4, T3FREE, THYROIDAB in the last 72 hours. Anemia Panel: No results for input(s): VITAMINB12, FOLATE, FERRITIN, TIBC, IRON, RETICCTPCT in the last 72 hours. Sepsis Labs: Recent Labs  Lab 05/22/20 1018 05/22/20 1111 05/22/20 1435 05/23/20 0941 05/25/20 0451  PROCALCITON  --   --  1.48  --  1.07  LATICACIDVEN 4.3* 3.8*  --  1.9  --     Recent Results (from the past 240 hour(s))  Blood culture (single)     Status: None   Collection Time: 05/22/20 10:19 AM   Specimen: BLOOD  Result Value Ref Range Status   Specimen Description BLOOD LEFT ARM  Final   Special Requests   Final    BOTTLES DRAWN AEROBIC AND ANAEROBIC Blood Culture adequate volume   Culture   Final    NO GROWTH 5 DAYS Performed at [facility], [address] [place], [place] [address]    Report Status 05/27/2020 FINAL  Final  Respiratory Panel by RT PCR (Flu A&B, Covid) - Nasopharyngeal Swab     Status: None   Collection Time: 05/22/20 10:19 AM   Specimen: Nasopharyngeal Swab  Result Value Ref Range Status   SARS Coronavirus 2 by RT PCR NEGATIVE NEGATIVE Final    Comment: (NOTE) SARS-CoV-2 target nucleic acids are NOT DETECTED.  The SARS-CoV-2 RNA is generally detectable in upper respiratoy specimens during the acute phase of infection. The lowest concentration of SARS-CoV-2 viral copies this assay can detect is 131 copies/mL. A negative result does not preclude SARS-Cov-2 infection and should not be used as the sole basis for treatment or other patient management decisions. A negative result may occur with  improper specimen collection/handling, submission of specimen other than nasopharyngeal swab, presence of viral mutation(s) within the areas targeted by this assay, and inadequate number of viral copies (<131 copies/mL). A negative result must be combined with  clinical observations, patient history, and epidemiological information. The expected result is Negative.  Fact Sheet for Patients:  PinkCheek.be  Fact Sheet for Healthcare Providers:  GravelBags.it  This test is no t yet approved or cleared by the [place] FDA and  has been authorized for detection and/or diagnosis of SARS-CoV-2 by FDA under an Emergency Use Authorization (EUA). This EUA will remain  in effect (meaning this test can be used) for the duration of the COVID-19 declaration under Section 564(b)(1) of the Act, 21 U.S.C. section 360bbb-3(b)(1), unless the authorization is terminated or revoked sooner.     Influenza A by PCR NEGATIVE NEGATIVE Final   Influenza B by PCR NEGATIVE NEGATIVE Final    Comment: (NOTE) The Xpert Xpress  SARS-CoV-2/FLU/RSV assay is intended as an aid in  the diagnosis of influenza from Nasopharyngeal swab specimens and  should not be used as a sole basis for treatment. Nasal washings and  aspirates are unacceptable for Xpert Xpress SARS-CoV-2/FLU/RSV  testing.  Fact Sheet for Patients: PinkCheek.be  Fact Sheet for Healthcare Providers: GravelBags.it  This test is not yet approved or cleared by the [place] FDA and  has been authorized for detection and/or diagnosis of SARS-CoV-2 by  FDA under an Emergency Use Authorization (EUA). This EUA will remain  in effect (meaning this test can be used) for the duration of the  Covid-19 declaration under Section 564(b)(1) of the Act, 21  U.S.C. section 360bbb-3(b)(1), unless the authorization is  terminated or revoked. Performed at [facility], [address] [place], [place] [address]   Urine culture     Status: Abnormal   Collection Time: 05/22/20  4:18 PM   Specimen: Urine, Random  Result Value Ref Range Status   Specimen Description   Final    URINE,  RANDOM Performed at [facility], [address] [place], [place] [address]    Special Requests   Final    NONE Performed at [facility], [address] [place], [place] [address]    Culture 20,000 COLONIES/mL ENTEROCOCCUS FAECALIS (A)  Final   Report Status 05/24/2020 FINAL  Final   Organism ID, Bacteria ENTEROCOCCUS FAECALIS (A)  Final      Susceptibility   Enterococcus faecalis - MIC*    AMPICILLIN <=2 SENSITIVE Sensitive     NITROFURANTOIN <=16 SENSITIVE Sensitive     VANCOMYCIN 1 SENSITIVE Sensitive     * 20,000 COLONIES/mL ENTEROCOCCUS FAECALIS  Culture, blood (single) w Reflex to ID Panel     Status: None   Collection Time: 05/23/20  6:14 AM   Specimen: BLOOD  Result Value Ref Range Status   Specimen Description BLOOD LFA  Final   Special Requests   Final    BOTTLES DRAWN AEROBIC AND ANAEROBIC Blood Culture adequate volume   Culture   Final    NO GROWTH 5 DAYS Performed at [facility], [address] [place], [place] [address]    Report Status 05/28/2020 FINAL  Final  MRSA PCR Screening     Status: Abnormal   Collection Time: 05/23/20  8:16 AM   Specimen: Nasopharyngeal  Result Value Ref Range Status   MRSA by PCR POSITIVE (A) NEGATIVE Final    Comment:        The GeneXpert MRSA Assay (FDA approved for NASAL specimens only), is one component of a comprehensive MRSA colonization surveillance program. It is not intended to diagnose MRSA infection nor to guide or monitor treatment for MRSA infections. RESULT CALLED TO, READ BACK BY AND VERIFIED WITH: ANNIE SMITH AT 6578 ON 05/23/2020 [place]. Performed at [facility], [address] [place], [place] [address]   Culture, sputum-assessment     Status: None   Collection Time: 05/24/20  8:58 AM   Specimen: Sputum  Result Value Ref Range Status   Specimen Description SPUTUM  Final   Special Requests NONE  Final   Sputum evaluation   Final    THIS SPECIMEN IS ACCEPTABLE FOR  SPUTUM CULTURE Performed at [facility], [address], [place], [place] [address]    Report Status 05/24/2020 FINAL  Final  Culture, respiratory     Status: None   Collection Time: 05/24/20  8:58 AM   Specimen:  SPU  Result Value Ref Range Status   Specimen Description   Final    SPUTUM Performed at [facility], [address] [place], [place] [address]    Special Requests   Final    NONE Reflexed from [id number] Performed at [facility], [address] [place], [place] [address]    Gram Stain   Final    MODERATE WBC PRESENT, PREDOMINANTLY PMN MODERATE SQUAMOUS EPITHELIAL CELLS PRESENT FEW GRAM POSITIVE COCCI IN CLUSTERS RARE YEAST Performed at [facility], [place] [address] [place], [place] [address]    Culture   Final    MODERATE METHICILLIN RESISTANT STAPHYLOCOCCUS AUREUS   Report Status 05/26/2020 FINAL  Final   Organism ID, Bacteria METHICILLIN RESISTANT STAPHYLOCOCCUS AUREUS  Final      Susceptibility   Methicillin resistant staphylococcus aureus - MIC*    CIPROFLOXACIN >=8 RESISTANT Resistant     ERYTHROMYCIN >=8 RESISTANT Resistant     GENTAMICIN <=0.5 SENSITIVE Sensitive     OXACILLIN >=4 RESISTANT Resistant     TETRACYCLINE <=1 SENSITIVE Sensitive     VANCOMYCIN 1 SENSITIVE Sensitive     TRIMETH/SULFA >=320 RESISTANT Resistant     CLINDAMYCIN <=0.25 SENSITIVE Sensitive     RIFAMPIN <=0.5 SENSITIVE Sensitive     Inducible Clindamycin NEGATIVE Sensitive     * MODERATE METHICILLIN RESISTANT STAPHYLOCOCCUS AUREUS     Radiology Studies: No results found.  Scheduled Meds: . apixaban  5 mg Oral BID  . budesonide  9 mg Oral Daily  . diltiazem  120 mg Oral Daily  . metoprolol tartrate  50 mg Oral BID   Continuous Infusions:    LOS: 6 days    Enzo Bi, MD [facility]  If 7PM-7AM, please contact night-coverage [email or web address]  05/28/2020, 5:54 PM

## 2020-05-28 NOTE — Progress Notes (Signed)
Patient has entered hospice.  Questions answered for multiple family members.  We will sign off now

## 2020-05-28 NOTE — Progress Notes (Signed)
PROGRESS NOTE    Joanne [name]  ALP:[id number] DOB: [birth year] DOA: 05/22/2020 PCP: Tracie Harrier, MD   Brief Narrative: Taken from H&P. Joanne [name] is a 86 y.o. female with medical history significant of hypertension, hyperlipidemia, GERD, depression,Fuch'sendothelial dystrophy, Crohn's disease, anemia, Zenker's diverticulum, a fib on Eliquis, sCHF, anemia, who presents with SOB, initially requiring BiPAP.  Now off the BiPAP and saturating well on her baseline oxygen of 3 to 4 L. Patient was found to be in A. fib with RVR and hypotensive. She is Dr. Bethanne Ginger patient, and Dr. Chancy Milroy who was covering [facility] cardiology was consulted and advised to give her digoxin. Patient received 500 cc bolus in ED with improvement in her blood pressure. On arrival she had leukocytosis at 25,BNP 1598, lactic acid 4.3, 3.8, INR 1.01, PTT 31, troponin 39, negative Covid PCR, electrolytes renal function okay, temperature normal, tachypnea with RR 29.  Procalcitonin of 1.48. Chest x-ray with cardiomegaly and pulmonary vascular congestion.LEFT interstitial/airspace disease and new RIGHT interstitial/mild airspace opacities, favor edema over infection. 2. Probable small bilateral pleural effusions. 3. LEFT LOWER lung consolidation/atelectasis.  She was started on antibiotics for a possible pneumonia and gentle diuresis.  Subjective: Family concerned that pt has not been eating.  Pt denied pain or anxiety.     Assessment & Plan:   Principal Problem:   Acute on chronic respiratory failure with hypoxia and hypercapnia (HCC) Active Problems:   Benign essential HTN   GERD without esophagitis   Crohn disease (HCC)   Depression   Atrial fibrillation with RVR (HCC)   Acute on chronic systolic CHF (congestive heart failure) (HCC)   Severe sepsis (HCC)   Elevated troponin   HCAP (healthcare-associated pneumonia)   Normocytic anemia   Acute exacerbation of CHF (congestive heart failure) ([place])  # Comfort  care status --initiated 05/26/20  # AMS increased somnolence # Possible hospital delirium  --acute onset late morning 9/29, per son.  Son also concerned about increased confusion.  No focal neurological deficits  --venous blood gas unremarkable, ammonia level wnl.  Acute on chronic respiratory failure with hypoxia and hypercapnia due to acute on chronic systolic CHF exacerbation and possible HCAP. Patient appears clinically volume up with 1% lower extremity edema and significant edema surrounding her buttock area. Patient is incontinent and uses diaper-unable to calculate output. Clinically seems improving as she was saturating well on her baseline oxygen of 3 to 4 L per daughter.Echocardiogram with EF of 40 to 45% which is an improvement from her prior echo done in July 2021 where EF was 30 to 35%, global hypokinesis and IVC with more than 50% collapsibility.  Blood cultures negative.  Urine cultures with 20,000 colonies of Enterococcus faecalis with good sensitivity.  She was started on cefepime and vancomycin. Repeat chest x-ray this morning with persistent pulmonary vascular congestion and a possible left lower lobe infiltrate. --received 4 days of vanc/cefe before comfort care, no further abx. -Continue supplemental oxygen for comfort  Severe sepsis due to possible HCAP (healthcare-associated pneumonia): Patient meets criteria for severe sepsis with leukocytosis, tachycardia, tachypnea with heart rate of 170, also has hypotension.  Lactic acid is elevated 4.3 -->3.8>>.1.9.  Patient has received 500 cc of normal saline, will not give more IV fluid due to fluid overload.  Leukocytosis resolved.  Lactic acidosis resolved.  Blood cultures negative in 24-hour, UA with pyuria, urine culture with 20,000 colonies of Enterococcus faecalis with good sensitivity.  Procalcitonin at 1.48. --received 4 days of vanc/cefe before comfort  care, no further abx  Lower extremity cellulitis.  Lower extremities  with edema and erythema, left more than right.  Concerning for cellulitis.  No hyperthermia. --received 4 days of vanc/cefe before comfort care, no further abx  Hypokalemia.  Patient has an history of chronic hypokalemia and hypomagnesemia secondary to persistent GI losses with her Crohn's disease.  --no lab checks 2/2 comfort care  Elevated troponin.  No chest pain.  Peaked at 31, now trending down.  Most likely secondary to demand ischemia.  A. fib with RVR.  Dr. Humphrey Rolls was consulted over the weekend and they advised giving her a dose of digoxin. Heart rate improved heart rate stays in the 90s. --continue cardizem, lopressor and Eliquis, per family wish  Essential hypertension.  Blood pressure within goal today. --continue cardizem and Lopressor  GERD without esophagitis -Protonix d/c'ed  Crohn disease ([place]) -Continue budesonide, per family wish  Depression -home medications d/c'ed  Normocytic anemia: Hemoglobin dropped to 8.1 today, no obvious bleeding but patient is on Eliquis. --no lab checks    Objective: Vitals:   05/26/20 2110 05/27/20 0814 05/27/20 1724 05/28/20 0418  BP: 134/71 140/62 138/72 124/72  Pulse: 89 99 83 83  Resp: 20 20 18 16   Temp: 98.7 F (37.1 C) (!) 96.3 F (35.7 C) (!) 97.5 F (36.4 C) (!) 97.5 F (36.4 C)  TempSrc: Oral Axillary Oral Oral  SpO2: 99% 99% 97% 100%  Weight:      Height:       No intake or output data in the 24 hours ending 05/28/20 1527 Filed Weights   05/22/20 1012 05/26/20 0500  Weight: 71.2 kg 68.1 kg    Examination:  Constitutional: NAD, alert, not talking, holding her family's hands HEENT: conjunctivae and lids normal, EOMI CV: No cyanosis.   RESP: normal respiratory effort Extremities: No effusions, edema in BLE SKIN: warm, dry and intact Neuro: II - XII grossly intact.     DVT prophylaxis: OV:[id number] Code Status: DNR  Family Communication: son and daughter updated at the bedside Status is:  inpatient Dispo:   The patient is from: SNF Anticipated d/c is to: hospice Anticipated d/c date is: whenever bed available Patient currently is not medically stable to d/c due to: waiting on hospice bed    Consultants:   Cardiology  Procedures:  Antimicrobials:  Cefepime Vancomycin.  Data Reviewed: I have personally reviewed following labs and imaging studies  CBC: Recent Labs  Lab 05/22/20 1018 05/23/20 0614 05/24/20 0551 05/25/20 0451 05/26/20 0611  WBC 25.1* 8.2 7.5 11.2* 8.3  NEUTROABS 22.4*  --   --   --   --   HGB 9.4* 8.4* 8.1* 8.4* 8.9*  HCT 31.2* 25.5* 26.4* 27.0* 27.9*  MCV 92.0 87.0 88.9 88.8 88.0  PLT 354 263 266 302 518   Basic Metabolic Panel: Recent Labs  Lab 05/22/20 1018 05/23/20 0614 05/24/20 0551 05/25/20 0451 05/26/20 0611  NA 140 141 141 141 139  K 3.5 2.9* 2.3* 4.0 4.1  CL 96* 99 101 102 100  CO2 29 32 31 29 32  GLUCOSE 185* 95 89 107* 98  BUN 20 22 15 16 18   CREATININE 0.75 0.77 0.49 0.57 0.59  CALCIUM 8.4* 7.8* 7.5* 8.0* 8.3*  MG  --  1.3* 1.6*  --  1.8   GFR: Estimated Creatinine Clearance: 43.4 mL/min (by C-G formula based on SCr of 0.59 mg/dL). Liver Function Tests: Recent Labs  Lab 05/22/20 1018  AST 36  ALT 29  ALKPHOS  102  BILITOT 0.6  PROT 5.5*  ALBUMIN 2.5*   No results for input(s): LIPASE, AMYLASE in the last 168 hours. Recent Labs  Lab 05/26/20 1119  AMMONIA 18   Coagulation Profile: Recent Labs  Lab 05/22/20 1018  INR 1.1   Cardiac Enzymes: No results for input(s): CKTOTAL, CKMB, CKMBINDEX, TROPONINI in the last 168 hours. BNP (last 3 results) No results for input(s): PROBNP in the last 8760 hours. HbA1C: No results for input(s): HGBA1C in the last 72 hours. CBG: No results for input(s): GLUCAP in the last 168 hours. Lipid Profile: No results for input(s): CHOL, HDL, LDLCALC, TRIG, CHOLHDL, LDLDIRECT in the last 72 hours. Thyroid Function Tests: No results for input(s): TSH, T4TOTAL, FREET4,  T3FREE, THYROIDAB in the last 72 hours. Anemia Panel: No results for input(s): VITAMINB12, FOLATE, FERRITIN, TIBC, IRON, RETICCTPCT in the last 72 hours. Sepsis Labs: Recent Labs  Lab 05/22/20 1018 05/22/20 1111 05/22/20 1435 05/23/20 0941 05/25/20 0451  PROCALCITON  --   --  1.48  --  1.07  LATICACIDVEN 4.3* 3.8*  --  1.9  --     Recent Results (from the past 240 hour(s))  Blood culture (single)     Status: None   Collection Time: 05/22/20 10:19 AM   Specimen: BLOOD  Result Value Ref Range Status   Specimen Description BLOOD LEFT ARM  Final   Special Requests   Final    BOTTLES DRAWN AEROBIC AND ANAEROBIC Blood Culture adequate volume   Culture   Final    NO GROWTH 5 DAYS Performed at [facility], [address] [place], [place] [address]    Report Status 05/27/2020 FINAL  Final  Respiratory Panel by RT PCR (Flu A&B, Covid) - Nasopharyngeal Swab     Status: None   Collection Time: 05/22/20 10:19 AM   Specimen: Nasopharyngeal Swab  Result Value Ref Range Status   SARS Coronavirus 2 by RT PCR NEGATIVE NEGATIVE Final    Comment: (NOTE) SARS-CoV-2 target nucleic acids are NOT DETECTED.  The SARS-CoV-2 RNA is generally detectable in upper respiratoy specimens during the acute phase of infection. The lowest concentration of SARS-CoV-2 viral copies this assay can detect is 131 copies/mL. A negative result does not preclude SARS-Cov-2 infection and should not be used as the sole basis for treatment or other patient management decisions. A negative result may occur with  improper specimen collection/handling, submission of specimen other than nasopharyngeal swab, presence of viral mutation(s) within the areas targeted by this assay, and inadequate number of viral copies (<131 copies/mL). A negative result must be combined with clinical observations, patient history, and epidemiological information. The expected result is Negative.  Fact Sheet for Patients:   PinkCheek.be  Fact Sheet for Healthcare Providers:  GravelBags.it  This test is no t yet approved or cleared by the [place] FDA and  has been authorized for detection and/or diagnosis of SARS-CoV-2 by FDA under an Emergency Use Authorization (EUA). This EUA will remain  in effect (meaning this test can be used) for the duration of the COVID-19 declaration under Section 564(b)(1) of the Act, 21 U.S.C. section 360bbb-3(b)(1), unless the authorization is terminated or revoked sooner.     Influenza A by PCR NEGATIVE NEGATIVE Final   Influenza B by PCR NEGATIVE NEGATIVE Final    Comment: (NOTE) The Xpert Xpress SARS-CoV-2/FLU/RSV assay is intended as an aid in  the diagnosis of influenza from Nasopharyngeal swab specimens and  should not be used as a sole  basis for treatment. Nasal washings and  aspirates are unacceptable for Xpert Xpress SARS-CoV-2/FLU/RSV  testing.  Fact Sheet for Patients: PinkCheek.be  Fact Sheet for Healthcare Providers: GravelBags.it  This test is not yet approved or cleared by the [place] FDA and  has been authorized for detection and/or diagnosis of SARS-CoV-2 by  FDA under an Emergency Use Authorization (EUA). This EUA will remain  in effect (meaning this test can be used) for the duration of the  Covid-19 declaration under Section 564(b)(1) of the Act, 21  U.S.C. section 360bbb-3(b)(1), unless the authorization is  terminated or revoked. Performed at [facility], [address] [place], [place] [address]   Urine culture     Status: Abnormal   Collection Time: 05/22/20  4:18 PM   Specimen: Urine, Random  Result Value Ref Range Status   Specimen Description   Final    URINE, RANDOM Performed at [facility], [address], [place], [place] [address]    Special Requests   Final    NONE Performed at  [facility], [address] [place], [place] [address]    Culture 20,000 COLONIES/mL ENTEROCOCCUS FAECALIS (A)  Final   Report Status 05/24/2020 FINAL  Final   Organism ID, Bacteria ENTEROCOCCUS FAECALIS (A)  Final      Susceptibility   Enterococcus faecalis - MIC*    AMPICILLIN <=2 SENSITIVE Sensitive     NITROFURANTOIN <=16 SENSITIVE Sensitive     VANCOMYCIN 1 SENSITIVE Sensitive     * 20,000 COLONIES/mL ENTEROCOCCUS FAECALIS  Culture, blood (single) w Reflex to ID Panel     Status: None   Collection Time: 05/23/20  6:14 AM   Specimen: BLOOD  Result Value Ref Range Status   Specimen Description BLOOD LFA  Final   Special Requests   Final    BOTTLES DRAWN AEROBIC AND ANAEROBIC Blood Culture adequate volume   Culture   Final    NO GROWTH 5 DAYS Performed at [facility], [address] [place], [place] [address]    Report Status 05/28/2020 FINAL  Final  MRSA PCR Screening     Status: Abnormal   Collection Time: 05/23/20  8:16 AM   Specimen: Nasopharyngeal  Result Value Ref Range Status   MRSA by PCR POSITIVE (A) NEGATIVE Final    Comment:        The GeneXpert MRSA Assay (FDA approved for NASAL specimens only), is one component of a comprehensive MRSA colonization surveillance program. It is not intended to diagnose MRSA infection nor to guide or monitor treatment for MRSA infections. RESULT CALLED TO, READ BACK BY AND VERIFIED WITH: ANNIE SMITH AT 6222 ON 05/23/2020 [place]. Performed at [facility], [address] [place], [place] [address]   Culture, sputum-assessment     Status: None   Collection Time: 05/24/20  8:58 AM   Specimen: Sputum  Result Value Ref Range Status   Specimen Description SPUTUM  Final   Special Requests NONE  Final   Sputum evaluation   Final    THIS SPECIMEN IS ACCEPTABLE FOR SPUTUM CULTURE Performed at [facility], [address], [place],  [address]    Report Status 05/24/2020 FINAL  Final    Culture, respiratory     Status: None   Collection Time: 05/24/20  8:58 AM   Specimen: SPU  Result Value Ref Range Status   Specimen Description   Final    SPUTUM Performed at [facility], [place]  [address] [place], [place] [address]    Special Requests   Final    NONE Reflexed from [phone] Performed at [facility], [place], [place] [address]    Gram Stain   Final    MODERATE WBC PRESENT, PREDOMINANTLY PMN MODERATE SQUAMOUS EPITHELIAL CELLS PRESENT FEW GRAM POSITIVE COCCI IN CLUSTERS RARE YEAST Performed at [facility], [place] [address], [place], [place] [address]    Culture   Final    MODERATE METHICILLIN RESISTANT STAPHYLOCOCCUS AUREUS   Report Status 05/26/2020 FINAL  Final   Organism ID, Bacteria METHICILLIN RESISTANT STAPHYLOCOCCUS AUREUS  Final      Susceptibility   Methicillin resistant staphylococcus aureus - MIC*    CIPROFLOXACIN >=8 RESISTANT Resistant     ERYTHROMYCIN >=8 RESISTANT Resistant     GENTAMICIN <=0.5 SENSITIVE Sensitive     OXACILLIN >=4 RESISTANT Resistant     TETRACYCLINE <=1 SENSITIVE Sensitive     VANCOMYCIN 1 SENSITIVE Sensitive     TRIMETH/SULFA >=320 RESISTANT Resistant     CLINDAMYCIN <=0.25 SENSITIVE Sensitive     RIFAMPIN <=0.5 SENSITIVE Sensitive     Inducible Clindamycin NEGATIVE Sensitive     * MODERATE METHICILLIN RESISTANT STAPHYLOCOCCUS AUREUS     Radiology Studies: No results found.  Scheduled Meds: . apixaban  5 mg Oral BID  . budesonide  9 mg Oral Daily  . diltiazem  120 mg Oral Daily  . metoprolol tartrate  50 mg Oral BID   Continuous Infusions:    LOS: 6 days    Joanne Bi, MD [facility]  If 7PM-7AM, please contact night-coverage [email or web address]  05/28/2020, 3:27 PM

## 2020-05-29 DIAGNOSIS — J9622 Acute and chronic respiratory failure with hypercapnia: Secondary | ICD-10-CM | POA: Diagnosis not present

## 2020-05-29 DIAGNOSIS — J9621 Acute and chronic respiratory failure with hypoxia: Secondary | ICD-10-CM | POA: Diagnosis not present

## 2020-05-29 NOTE — Progress Notes (Signed)
PROGRESS NOTE    Joanne [name]  QMG:[id number] DOB: 06-19-1931 DOA: 05/22/2020 PCP: Tracie Harrier, MD   Brief Narrative:  Joanne [name] is a 86 y.o. female with medical history significant of hypertension, hyperlipidemia, GERD, depression,Fuch'sendothelial dystrophy, Crohn's disease, anemia, Zenker's diverticulum, a fib on Eliquis, sCHF, anemia, who presents with SOB, initially requiring BiPAP.  Now off the BiPAP and saturating well on her baseline oxygen of 3 to 4 L. Patient was found to be in A. fib with RVR and hypotensive. She is Dr. Bethanne Ginger patient, and Dr. Chancy Milroy who was covering [facility] cardiology was consulted and advised to give her digoxin. Patient received 500 cc bolus in ED with improvement in her blood pressure. On arrival she had leukocytosis at 25,BNP 1598, lactic acid 4.3, 3.8, INR 1.01, PTT 31, troponin 39, negative Covid PCR, electrolytes renal function okay, temperature normal, tachypnea with RR 29.  Procalcitonin of 1.48. Chest x-ray with cardiomegaly and pulmonary vascular congestion.LEFT interstitial/airspace disease and new RIGHT interstitial/mild airspace opacities, favor edema over infection. 2. Probable small bilateral pleural effusions. 3. LEFT LOWER lung consolidation/atelectasis.  She was started on antibiotics for a possible pneumonia and gentle diuresis.  Subjective: Pt was more teary today.  Family at beside.   Assessment & Plan:   Principal Problem:   Acute on chronic respiratory failure with hypoxia and hypercapnia (HCC) Active Problems:   Benign essential HTN   GERD without esophagitis   Crohn disease (HCC)   Depression   Atrial fibrillation with RVR (HCC)   Acute on chronic systolic CHF (congestive heart failure) (HCC)   Severe sepsis (HCC)   Elevated troponin   HCAP (healthcare-associated pneumonia)   Normocytic anemia   Acute exacerbation of CHF (congestive heart failure) ([place])  # Comfort care status --initiated 05/26/20  # AMS  increased somnolence # Possible hospital delirium  --acute onset late morning 9/29, per son.  Son also concerned about increased confusion.  No focal neurological deficits  --venous blood gas unremarkable, ammonia level wnl.  Acute on chronic respiratory failure with hypoxia and hypercapnia due to acute on chronic systolic CHF exacerbation and possible HCAP. Patient appears clinically volume up with 1% lower extremity edema and significant edema surrounding her buttock area. Patient is incontinent and uses diaper-unable to calculate output. Clinically seems improving as she was saturating well on her baseline oxygen of 3 to 4 L per daughter.Echocardiogram with EF of 40 to 45% which is an improvement from her prior echo done in July 2021 where EF was 30 to 35%, global hypokinesis and IVC with more than 50% collapsibility.  Blood cultures negative.  Urine cultures with 20,000 colonies of Enterococcus faecalis with good sensitivity.  She was started on cefepime and vancomycin. Repeat chest x-ray this morning with persistent pulmonary vascular congestion and a possible left lower lobe infiltrate. --received 4 days of vanc/cefe before comfort care, no further abx. -Continue supplemental oxygen for comfort  Severe sepsis due to possible HCAP (healthcare-associated pneumonia): Patient meets criteria for severe sepsis with leukocytosis, tachycardia, tachypnea with heart rate of 170, also has hypotension.  Lactic acid is elevated 4.3 -->3.8>>.1.9.  Patient has received 500 cc of normal saline, will not give more IV fluid due to fluid overload.  Leukocytosis resolved.  Lactic acidosis resolved.  Blood cultures negative in 24-hour, UA with pyuria, urine culture with 20,000 colonies of Enterococcus faecalis with good sensitivity.  Procalcitonin at 1.48. --received 4 days of vanc/cefe before comfort care, no further abx  Lower extremity cellulitis.  Lower extremities with edema and erythema, left more than  right.  Concerning for cellulitis.  No hyperthermia. --received 4 days of vanc/cefe before comfort care, no further abx  Hypokalemia.  Patient has an history of chronic hypokalemia and hypomagnesemia secondary to persistent GI losses with her Crohn's disease.  --no lab checks 2/2 comfort care  Elevated troponin.  No chest pain.  Peaked at 78, now trending down.  Most likely secondary to demand ischemia.  A. fib with RVR.  Dr. Humphrey Rolls was consulted over the weekend and they advised giving her a dose of digoxin. Heart rate improved heart rate stays in the 90s. --continue cardizem, lopressor and Eliquis, per family wish  Essential hypertension.  Blood pressure within goal today. --continue cardizem and Lopressor  GERD without esophagitis -Protonix d/c'ed  Crohn disease ([place]) -Continue budesonide, per family wish  Depression -home medications d/c'ed  Normocytic anemia: Hemoglobin dropped to 8.1 today, no obvious bleeding but patient is on Eliquis. --no lab checks    Objective: Vitals:   05/29/20 0602 05/29/20 1659 05/29/20 1932 05/30/20 1821  BP: 128/60 (!) 144/83 (!) 141/75 (!) 147/66  Pulse: 78 87 84 91  Resp:  14 20 18   Temp:  (!) 97.5 F (36.4 C) 99 F (37.2 C) 98.3 F (36.8 C)  TempSrc:  Oral Oral Oral  SpO2: 99% 91% 97% 100%  Weight:      Height:       No intake or output data in the 24 hours ending 06/03/20 2040 Filed Weights   05/22/20 1012 05/26/20 0500  Weight: 71.2 kg 68.1 kg    Examination:  Constitutional: NAD, alert, not talking, holding family's hands HEENT: conjunctivae and lids normal, EOMI CV: No cyanosis.   RESP: normal respiratory effort, on RA Extremities: No effusions, edema in BLE SKIN: warm, dry and intact Neuro: II - XII grossly intact.   Psych: depressed mood and affect.     DVT prophylaxis: [id number] Code Status: DNR  Family Communication: son and daughters updated at the bedside Status is: inpatient Dispo:   The patient is  from: SNF Anticipated d/c is to: hospice Anticipated d/c date is: whenever bed available Patient currently is not medically stable to d/c due to: waiting on hospice bed    Consultants:   Cardiology  Procedures:  Antimicrobials:  Cefepime Vancomycin.  Data Reviewed: I have personally reviewed following labs and imaging studies  CBC: No results for input(s): WBC, NEUTROABS, HGB, HCT, MCV, PLT in the last 168 hours. Basic Metabolic Panel: No results for input(s): NA, K, CL, CO2, GLUCOSE, BUN, CREATININE, CALCIUM, MG, PHOS in the last 168 hours. GFR: Estimated Creatinine Clearance: 43.4 mL/min (by C-G formula based on SCr of 0.59 mg/dL). Liver Function Tests: No results for input(s): AST, ALT, ALKPHOS, BILITOT, PROT, ALBUMIN in the last 168 hours. No results for input(s): LIPASE, AMYLASE in the last 168 hours. No results for input(s): AMMONIA in the last 168 hours. Coagulation Profile: No results for input(s): INR, PROTIME in the last 168 hours. Cardiac Enzymes: No results for input(s): CKTOTAL, CKMB, CKMBINDEX, TROPONINI in the last 168 hours. BNP (last 3 results) No results for input(s): PROBNP in the last 8760 hours. HbA1C: No results for input(s): HGBA1C in the last 72 hours. CBG: No results for input(s): GLUCAP in the last 168 hours. Lipid Profile: No results for input(s): CHOL, HDL, LDLCALC, TRIG, CHOLHDL, LDLDIRECT in the last 72 hours. Thyroid Function Tests: No results for input(s): TSH, T4TOTAL, FREET4, T3FREE, THYROIDAB in the last  72 hours. Anemia Panel: No results for input(s): VITAMINB12, FOLATE, FERRITIN, TIBC, IRON, RETICCTPCT in the last 72 hours. Sepsis Labs: No results for input(s): PROCALCITON, LATICACIDVEN in the last 168 hours.  No results found for this or any previous visit (from the past 240 hour(s)).   Radiology Studies: No results found.  Scheduled Meds:  Continuous Infusions:    LOS: 8 days    Enzo Bi, MD [facility]  If  7PM-7AM, please contact night-coverage [email or web address]  06/03/2020, 8:40 PM

## 2020-05-30 DIAGNOSIS — J9621 Acute and chronic respiratory failure with hypoxia: Secondary | ICD-10-CM | POA: Diagnosis not present

## 2020-05-30 DIAGNOSIS — J9622 Acute and chronic respiratory failure with hypercapnia: Secondary | ICD-10-CM | POA: Diagnosis not present

## 2020-05-30 MED ORDER — ONDANSETRON HCL 4 MG/2ML IJ SOLN: 4.0000 mg | Freq: Three times a day (TID) | INTRAMUSCULAR | 0 refills | Status: AC | PRN

## 2020-05-30 MED ORDER — POLYVINYL ALCOHOL 1.4 % OP SOLN: 1.0000 [drp] | Freq: Four times a day (QID) | OPHTHALMIC | 0 refills | Status: AC | PRN

## 2020-05-30 MED ORDER — HALOPERIDOL LACTATE 5 MG/ML IJ SOLN: 0.5000 mg | INTRAMUSCULAR | Status: AC | PRN

## 2020-05-30 MED ORDER — MORPHINE SULFATE (CONCENTRATE) 10 MG/0.5ML PO SOLN
5.0000 mg | ORAL | Status: AC | PRN
Start: 2020-05-30 — End: ?

## 2020-05-30 MED ORDER — GLYCOPYRROLATE 0.2 MG/ML IJ SOLN: 0.2000 mg | INTRAMUSCULAR | Status: AC | PRN

## 2020-05-30 MED ORDER — METOPROLOL TARTRATE 25 MG PO TABS: 50.0000 mg | ORAL_TABLET | Freq: Two times a day (BID) | ORAL | 0 refills | Status: AC

## 2020-05-30 NOTE — Discharge Summary (Signed)
Physician Discharge Summary   Joanne [name]  female DOB: Jul 20, 1931  MBT:[id number]  PCP: Tracie Harrier, MD  Admit date: 05/22/2020 Discharge date: 05/30/2020  Admitted From: home Disposition:  Hospice.   Family updated at the bedside prior to discharge.  CODE STATUS: DNR  Comfort care   Hospital Course:  For full details, please see H&P, progress notes, consult notes and ancillary notes.  Briefly,  Joanne Sallis [name] medical history significant ofhypertension, hyperlipidemia, GERD, depression,Fuch'sendothelial dystrophy, Crohn's disease, anemia, Zenker's diverticulum,a fib on Eliquis, sCHF, anemia, who presented with SOB, initially requiring BiPAP.  Patient was also found to be in A. fib with RVR and hypotensive.  Chest x-ray with cardiomegaly and pulmonary vascular congestion.  # Comfort care status Pt was treated for all her active presenting problems, however, was having worse mental status and stopped wanting to eat or drink.  Family later mentioned pt did have steady decline prior to presentation.  After discussion with palliative care provider, family decided on comfort care on 05/26/20 and chose hospice facility for discharge.  Pt has been comfortable prior to discharge, was eating few bites of offered foods, and did not want any pain or anxiety medication.  # AMS increased somnolence # Possible hospital delirium  acute onset late morning 9/29, per son.  Son also concerned about increased confusion.  No focal neurological deficits.  venous blood gas unremarkable, ammonia level wnl.  Next day, family initiated comfort care.  Acute on chronic respiratory failure with hypoxia and hypercapniadue toacute on chronic systolic CHF exacerbation and possible HCAP # Chronic hypoxic respiratory failure on 3-4L O2 baseline On presentation, Patient appeared clinically volume up with 1% lower extremity edema and significant edema surrounding her buttock  area. Initially required BiPAP but has since weaned down to baseline oxygen of 3 to 4 L.  Severe sepsis due to HCAP (healthcare-associated pneumonia) Patient met criteria for severe sepsis with leukocytosis, tachycardia, tachypnea with heart rate of 170, also has hypotension. Lactic acid was elevated 4.3 which normalized with gentle hydration.  CXR showed "LEFT LOWER lung consolidation/atelectasis."  Procalcitonin at 1.48. Pt received 4 days of vanc/cefe before comfort care, and then no further abx  Lower extremity cellulitis.   On presentation, pt had LE edema and erythema, left more than right.  Concerning for cellulitis.  No hyperthermia.  Pt received 4 days of vanc/cefe before comfort care, then no further abx  Hypokalemia.   Patient has an history of chronic hypokalemia and hypomagnesemia secondary to persistent GI losses with her Crohn's disease.   Elevated troponin due to demand ischemia No chest pain.  Peaked at 45, then trending down.  A. fib with RVR.   Dr. Humphrey Rolls was consulted over the weekend and they advised giving her a dose of digoxin.  Heart rate improved heart rate stays in the 90s.  continued cardizem, lopressor and Eliquis even after pt made comfort care, per family wish.  Essential hypertension.   continued cardizem and Lopressor even after pt made comfort care, per family wish.  GERD without esophagitis Protonix d/c'ed due to comfort care.  Crohn disease ([place]) Continued budesonide, even after pt made comfort care, per family wish.  Depression home medications d/c'ed due to comfort care.  Normocytic anemia Iron supplement d/c'ed due to comfort care.   Discharge Diagnoses:  Principal Problem:   Acute on chronic respiratory failure with hypoxia and hypercapnia (HCC) Active Problems:   Benign essential HTN   GERD without esophagitis  Crohn disease ([place])   Depression   Atrial fibrillation with RVR (HCC)   Acute on chronic systolic CHF (congestive  heart failure) (HCC)   Severe sepsis (HCC)   Elevated troponin   HCAP (healthcare-associated pneumonia)   Normocytic anemia   Acute exacerbation of CHF (congestive heart failure) ([place])    Discharge Instructions:  Allergies as of 05/30/2020      Reactions   Oxycodone Other (See Comments)   "makes her crazy"      Medication List    STOP taking these medications   acetaminophen 500 MG tablet Commonly known as: TYLENOL   ascorbic acid 500 MG tablet Commonly known as: VITAMIN C   calcium-vitamin D 500-200 MG-UNIT Tabs tablet Commonly known as: OSCAL WITH D   cholecalciferol 25 MCG (1000 UNIT) tablet Commonly known as: VITAMIN D   colestipol 1 g tablet Commonly known as: COLESTID   DULoxetine 20 MG capsule Commonly known as: CYMBALTA   ferrous sulfate 325 (65 FE) MG tablet   gabapentin 300 MG capsule Commonly known as: NEURONTIN   hydrocortisone 1 % ointment   ipratropium-albuterol 0.5-2.5 (3) MG/3ML Soln Commonly known as: DUONEB   lisinopril 2.5 MG tablet Commonly known as: ZESTRIL   liver oil-zinc oxide 40 % ointment Commonly known as: DESITIN   Loperamide A-D 2 MG tablet Generic drug: loperamide   magnesium oxide 400 MG tablet Commonly known as: MAG-OX   menthol-cetylpyridinium 3 MG lozenge Commonly known as: CEPACOL   multivitamin tablet   NexIUM 40 MG capsule Generic drug: esomeprazole   potassium chloride SA 20 MEQ tablet Commonly known as: KLOR-CON   prednisoLONE acetate 1 % ophthalmic suspension Commonly known as: PRED FORTE   saccharomyces boulardii 250 MG capsule Commonly known as: FLORASTOR   torsemide 20 MG tablet Commonly known as: DEMADEX   Vitamin B-12 5000 MCG Lozg   Zinc-220 220 (50 Zn) MG capsule Generic drug: zinc sulfate     TAKE these medications   apixaban 5 MG Tabs tablet Commonly known as: ELIQUIS Take 1 tablet (5 mg total) by mouth 2 (two) times daily.   budesonide 3 MG 24 hr capsule Commonly known as:  ENTOCORT EC Take 9 mg by mouth daily.   diltiazem 120 MG 24 hr capsule Commonly known as: CARDIZEM CD Take 1 capsule (120 mg total) by mouth daily.   glycopyrrolate 0.2 MG/ML injection Commonly known as: ROBINUL Inject 1 mL (0.2 mg total) into the vein every 4 (four) hours as needed (excessive secretions).   haloperidol lactate 5 MG/ML injection Commonly known as: HALDOL Inject 0.1 mLs (0.5 mg total) into the vein every 4 (four) hours as needed (or delirium).   metoprolol tartrate 25 MG tablet Commonly known as: LOPRESSOR Take 2 tablets (50 mg total) by mouth 2 (two) times daily. What changed: how much to take   morphine CONCENTRATE 10 MG/0.5ML Soln concentrated solution Take 0.25 mLs (5 mg total) by mouth every 2 (two) hours as needed for moderate pain (or dyspnea).   morphine CONCENTRATE 10 MG/0.5ML Soln concentrated solution Place 0.25 mLs (5 mg total) under the tongue every 2 (two) hours as needed for moderate pain (or dyspnea).   ondansetron 4 MG/2ML Soln injection Commonly known as: ZOFRAN Inject 2 mLs (4 mg total) into the vein every 8 (eight) hours as needed for nausea or vomiting.   polyvinyl alcohol 1.4 % ophthalmic solution Commonly known as: LIQUIFILM TEARS Place 1 drop into both eyes 4 (four) times daily as needed for dry  eyes.        Follow-up Information    [facility] up on 06/01/2020.   Specialty: Cardiology Why: at 1:30pm. Enter through the [place] entrance Contact information: [place]              Allergies  Allergen Reactions  . Oxycodone Other (See Comments)    "makes her crazy"     The results of significant diagnostics from this hospitalization (including imaging, microbiology, ancillary and laboratory) are listed below for reference.   Consultations:   Procedures/Studies: DG Chest 2 View  Result Date:  05/24/2020 CLINICAL DATA:  Shortness of breath EXAM: CHEST - 2 VIEW COMPARISON:  May 22, 2020. FINDINGS: There is persistent cardiomegaly with pulmonary venous hypertension. There is airspace opacity in the left lower lobe with moderate left pleural effusion. There is a degree of underlying interstitial prominence, likely edema. There is atelectatic change in the left upper lobe. No adenopathy appreciable. There is anterior wedging of a lower thoracic vertebral body. IMPRESSION: Cardiomegaly with pulmonary vascular congestion. Left pleural effusion with left lower lobe consolidation which potentially may represent alveolar edema. Underlying interstitial edema. Overall appearance is felt to be indicative of a degree of congestive heart failure. Note that there may be superimposed pneumonia in the left base. There is atelectatic change in the left upper lobe. Electronically Signed   By: Lowella Grip III M.D.   On: 05/24/2020 08:49   CT HEAD WO CONTRAST  Result Date: 05/26/2020 CLINICAL DATA:  Altered mental status. EXAM: CT HEAD WITHOUT CONTRAST TECHNIQUE: Contiguous axial images were obtained from the base of the skull through the vertex without intravenous contrast. COMPARISON:  March 15, 2020. FINDINGS: Brain: Mild diffuse cortical atrophy is noted. Mild chronic ischemic white matter disease is noted. No mass effect or midline shift is noted. Ventricular size is within normal limits. There is no evidence of mass lesion, hemorrhage or acute infarction. Vascular: No hyperdense vessel or unexpected calcification. Skull: Normal. Negative for fracture or focal lesion. Sinuses/Orbits: No acute finding. Other: None. IMPRESSION: Mild diffuse cortical atrophy. Mild chronic ischemic white matter disease. No acute intracranial abnormality seen. Electronically Signed   By: Marijo Conception M.D.   On: 05/26/2020 10:03   DG Chest Port 1 View  Result Date: 05/22/2020 CLINICAL DATA:  Shortness of breath. EXAM:  PORTABLE CHEST 1 VIEW COMPARISON:  03/23/2020 FINDINGS: Cardiomegaly again noted. Pulmonary vascular congestion, unchanged LEFT interstitial opacity/airspace disease and new RIGHT interstitial/mild airspace opacities are identified, favor edema over infection. Probable small bilateral pleural effusions noted. LEFT LOWER lung consolidation/atelectasis again noted. There is no evidence of pneumothorax. No acute bony abnormalities are identified. IMPRESSION: 1. Cardiomegaly with pulmonary vascular congestion, LEFT interstitial/airspace disease and new RIGHT interstitial/mild airspace opacities, favor edema over infection. 2. Probable small bilateral pleural effusions. 3. LEFT LOWER lung consolidation/atelectasis. Electronically Signed   By: Margarette Canada M.D.   On: 05/22/2020 10:54   ECHOCARDIOGRAM COMPLETE  Result Date: 05/23/2020    ECHOCARDIOGRAM REPORT   Patient Name:   ARMINTA GAMM Date of Exam: 05/23/2020 Medical Rec #:  [id number]     Height:       60.0 in Accession #:    [id number]    Weight:       157.0 lb Date of Birth:  [birth year]    BSA:          1.684 m Patient Age:  88 years      BP:           127/58 mmHg Patient Gender: F             HR:           107 bpm. Exam Location:  [facility] Procedure: 2D Echo Indications:     Atrial Fibrillation 427.31  History:         Patient has prior history of Echocardiogram examinations. CHF,                  Pleural Effusion; Risk Factors:Hypertension and Dyslipidemia.  Sonographer:     L Thornton-Maynard Referring Phys:  [id number] Soledad Gerlach NIU Diagnosing Phys: Neoma Laming MD IMPRESSIONS  1. Left ventricular ejection fraction, by estimation, is 40 to 45%. The left ventricle has mildly decreased function. The left ventricle demonstrates global hypokinesis. The left ventricular internal cavity size was mildly to moderately dilated. There is moderate concentric left ventricular hypertrophy. Left ventricular diastolic parameters are indeterminate.  2. Right ventricular systolic  function is normal. The right ventricular size is normal. There is mildly elevated pulmonary artery systolic pressure.  3. Left atrial size was mildly dilated.  4. Right atrial size was mild to moderately dilated.  5. The mitral valve is normal in structure. Moderate mitral valve regurgitation. No evidence of mitral stenosis.  6. The aortic valve is normal in structure. Aortic valve regurgitation is trivial. No aortic stenosis is present.  7. The inferior vena cava is normal in size with greater than 50% respiratory variability, suggesting right atrial pressure of 3 mmHg. FINDINGS  Left Ventricle: Left ventricular ejection fraction, by estimation, is 40 to 45%. The left ventricle has mildly decreased function. The left ventricle demonstrates global hypokinesis. The left ventricular internal cavity size was mildly to moderately dilated. There is moderate concentric left ventricular hypertrophy. Left ventricular diastolic parameters are indeterminate. Right Ventricle: The right ventricular size is normal. No increase in right ventricular wall thickness. Right ventricular systolic function is normal. There is mildly elevated pulmonary artery systolic pressure. The tricuspid regurgitant velocity is 2.88  m/s, and with an assumed right atrial pressure of 10 mmHg, the estimated right ventricular systolic pressure is 87.5 mmHg. Left Atrium: Left atrial size was mildly dilated. Right Atrium: Right atrial size was mild to moderately dilated. Pericardium: There is no evidence of pericardial effusion. Mitral Valve: The mitral valve is normal in structure. Moderate mitral valve regurgitation. No evidence of mitral valve stenosis. MV peak gradient, 7.6 mmHg. The mean mitral valve gradient is 4.0 mmHg. Tricuspid Valve: The tricuspid valve is normal in structure. Tricuspid valve regurgitation is mild . No evidence of tricuspid stenosis. Aortic Valve: The aortic valve is normal in structure. Aortic valve regurgitation is trivial.  No aortic stenosis is present. Aortic valve mean gradient measures 5.0 mmHg. Aortic valve peak gradient measures 9.0 mmHg. Aortic valve area, by VTI measures 2.14 cm. Pulmonic Valve: The pulmonic valve was normal in structure. Pulmonic valve regurgitation is mild. No evidence of pulmonic stenosis. Aorta: The aortic root is normal in size and structure. Venous: The inferior vena cava is normal in size with greater than 50% respiratory variability, suggesting right atrial pressure of 3 mmHg. IAS/Shunts: No atrial level shunt detected by color flow Doppler.  LEFT VENTRICLE PLAX 2D LVIDd:         3.15 cm  Diastology LVIDs:         2.42 cm  LV e' medial:    6.96 cm/s  LV PW:         1.38 cm  LV E/e' medial:  17.5 LV IVS:        1.30 cm  LV e' lateral:   6.20 cm/s LVOT diam:     2.10 cm  LV E/e' lateral: 19.7 LV SV:         54 LV SV Index:   32 LVOT Area:     3.46 cm  RIGHT VENTRICLE RV S prime:     13.80 cm/s TAPSE (M-mode): 1.6 cm LEFT ATRIUM             Index LA diam:        4.10 cm 2.43 cm/m LA Vol (A2C):   49.6 ml 29.45 ml/m LA Vol (A4C):   58.4 ml 34.68 ml/m LA Biplane Vol: 53.9 ml 32.00 ml/m  AORTIC VALVE                    PULMONIC VALVE AV Area (Vmax):    2.00 cm     PV Vmax:       1.14 m/s AV Area (Vmean):   1.87 cm     PV Peak grad:  5.2 mmHg AV Area (VTI):     2.14 cm AV Vmax:           150.00 cm/s AV Vmean:          109.000 cm/s AV VTI:            0.254 m AV Peak Grad:      9.0 mmHg AV Mean Grad:      5.0 mmHg LVOT Vmax:         86.50 cm/s LVOT Vmean:        58.700 cm/s LVOT VTI:          0.157 m LVOT/AV VTI ratio: 0.62  AORTA Ao Root diam: 3.40 cm MITRAL VALVE                TRICUSPID VALVE MV Area (PHT): 3.17 cm     TR Peak grad:   33.2 mmHg MV Peak grad:  7.6 mmHg     TR Vmax:        288.00 cm/s MV Mean grad:  4.0 mmHg MV Vmax:       1.38 m/s     SHUNTS MV Vmean:      98.8 cm/s    Systemic VTI:  0.16 m MV Decel Time: 239 msec     Systemic Diam: 2.10 cm MV E velocity: 122.00 cm/s Neoma Laming MD  Electronically signed by Neoma Laming MD Signature Date/Time: 05/23/2020/2:35:54 PM    Final       Labs: BNP (last 3 results) Recent Labs    03/15/20 0857 05/22/20 1019  BNP 573.7* 0,300.9*   Basic Metabolic Panel: Recent Labs  Lab 05/24/20 0551 05/25/20 0451 05/26/20 0611  NA 141 141 139  K 2.3* 4.0 4.1  CL 101 102 100  CO2 31 29 32  GLUCOSE 89 107* 98  BUN _0 CREATININE 0.49 0.57 0.59  CALCIUM 7.5* 8.0* 8.3*  MG 1.6*  --  1.8   Liver Function Tests: No results for input(s): AST, ALT, ALKPHOS, BILITOT, PROT, ALBUMIN in the last 168 hours. No results for input(s): LIPASE, AMYLASE in the last 168 hours. Recent Labs  Lab 05/26/20 1119  AMMONIA 18   CBC: Recent Labs  Lab 05/24/20 0551 05/25/20 0451 05/26/20 0611  WBC 7.5 11.2* 8.3  HGB 8.1* 8.4* 8.9*  HCT 26.4* 27.0* 27.9*  MCV 88.9 88.8 88.0  PLT 266 302 297   Cardiac Enzymes: No results for input(s): CKTOTAL, CKMB, CKMBINDEX, TROPONINI in the last 168 hours. BNP: Invalid input(s): POCBNP CBG: No results for input(s): GLUCAP in the last 168 hours. D-Dimer No results for input(s): DDIMER in the last 72 hours. Hgb A1c No results for input(s): HGBA1C in the last 72 hours. Lipid Profile No results for input(s): CHOL, HDL, LDLCALC, TRIG, CHOLHDL, LDLDIRECT in the last 72 hours. Thyroid function studies No results for input(s): TSH, T4TOTAL, T3FREE, THYROIDAB in the last 72 hours.  Invalid input(s): FREET3 Anemia work up No results for input(s): VITAMINB12, FOLATE, FERRITIN, TIBC, IRON, RETICCTPCT in the last 72 hours. Urinalysis    Component Value Date/Time   COLORURINE YELLOW (A) 05/22/2020 1618   APPEARANCEUR HAZY (A) 05/22/2020 1618   APPEARANCEUR Clear 01/12/2013 1321   LABSPEC 1.013 05/22/2020 1618   LABSPEC 1.011 01/12/2013 1321   PHURINE 5.0 05/22/2020 1618   GLUCOSEU NEGATIVE 05/22/2020 1618   GLUCOSEU Negative 01/12/2013 1321   HGBUR NEGATIVE 05/22/2020 1618   BILIRUBINUR NEGATIVE  05/22/2020 1618   BILIRUBINUR Negative 01/12/2013 1321   KETONESUR NEGATIVE 05/22/2020 1618   PROTEINUR 30 (A) 05/22/2020 1618   NITRITE NEGATIVE 05/22/2020 1618   LEUKOCYTESUR LARGE (A) 05/22/2020 1618   LEUKOCYTESUR Negative 01/12/2013 1321   Sepsis Labs Invalid input(s): PROCALCITONIN,  WBC,  LACTICIDVEN Microbiology Recent Results (from the past 240 hour(s))  Blood culture (single)     Status: None   Collection Time: 05/22/20 10:19 AM   Specimen: BLOOD  Result Value Ref Range Status   Specimen Description BLOOD LEFT ARM  Final   Special Requests   Final    BOTTLES DRAWN AEROBIC AND ANAEROBIC Blood Culture adequate volume   Culture   Final    NO GROWTH 5 DAYS Performed at [facility], [address] [place], [place] [address]    Report Status 05/27/2020 FINAL  Final  Respiratory Panel by RT PCR (Flu A&B, Covid) - Nasopharyngeal Swab     Status: None   Collection Time: 05/22/20 10:19 AM   Specimen: Nasopharyngeal Swab  Result Value Ref Range Status   SARS Coronavirus 2 by RT PCR NEGATIVE NEGATIVE Final    Comment: (NOTE) SARS-CoV-2 target nucleic acids are NOT DETECTED.  The SARS-CoV-2 RNA is generally detectable in upper respiratoy specimens during the acute phase of infection. The lowest concentration of SARS-CoV-2 viral copies this assay can detect is 131 copies/mL. A negative result does not preclude SARS-Cov-2 infection and should not be used as the sole basis for treatment or other patient management decisions. A negative result may occur with  improper specimen collection/handling, submission of specimen other than nasopharyngeal swab, presence of viral mutation(s) within the areas targeted by this assay, and inadequate number of viral copies (<131 copies/mL). A negative result must be combined with clinical observations, patient history, and epidemiological information. The expected result is Negative.  Fact Sheet for Patients:    PinkCheek.be  Fact Sheet for Healthcare Providers:  GravelBags.it  This test is no t yet approved or cleared by the [place] FDA and  has been authorized for detection and/or diagnosis of SARS-CoV-2 by FDA under an Emergency Use Authorization (EUA). This EUA will remain  in effect (meaning this test can be used) for the duration of the COVID-19 declaration under Section 564(b)(1) of the Act, 21 U.S.C. section 360bbb-3(b)(1), unless the authorization is terminated  or revoked sooner.     Influenza A by PCR NEGATIVE NEGATIVE Final   Influenza B by PCR NEGATIVE NEGATIVE Final    Comment: (NOTE) The Xpert Xpress SARS-CoV-2/FLU/RSV assay is intended as an aid in  the diagnosis of influenza from Nasopharyngeal swab specimens and  should not be used as a sole basis for treatment. Nasal washings and  aspirates are unacceptable for Xpert Xpress SARS-CoV-2/FLU/RSV  testing.  Fact Sheet for Patients: PinkCheek.be  Fact Sheet for Healthcare Providers: GravelBags.it  This test is not yet approved or cleared by the [place] FDA and  has been authorized for detection and/or diagnosis of SARS-CoV-2 by  FDA under an Emergency Use Authorization (EUA). This EUA will remain  in effect (meaning this test can be used) for the duration of the  Covid-19 declaration under Section 564(b)(1) of the Act, 21  U.S.C. section 360bbb-3(b)(1), unless the authorization is  terminated or revoked. Performed at [facility], [address] [place], [place] [address]   Urine culture     Status: Abnormal   Collection Time: 05/22/20  4:18 PM   Specimen: Urine, Random  Result Value Ref Range Status   Specimen Description   Final    URINE, RANDOM Performed at [facility], [address], [place], [place] [address]    Special Requests   Final    NONE Performed at  [facility], [address] [place], [place] [address]    Culture 20,000 COLONIES/mL ENTEROCOCCUS FAECALIS (A)  Final   Report Status 05/24/2020 FINAL  Final   Organism ID, Bacteria ENTEROCOCCUS FAECALIS (A)  Final      Susceptibility   Enterococcus faecalis - MIC*    AMPICILLIN <=2 SENSITIVE Sensitive     NITROFURANTOIN <=16 SENSITIVE Sensitive     VANCOMYCIN 1 SENSITIVE Sensitive     * 20,000 COLONIES/mL ENTEROCOCCUS FAECALIS  Culture, blood (single) w Reflex to ID Panel     Status: None   Collection Time: 05/23/20  6:14 AM   Specimen: BLOOD  Result Value Ref Range Status   Specimen Description BLOOD LFA  Final   Special Requests   Final    BOTTLES DRAWN AEROBIC AND ANAEROBIC Blood Culture adequate volume   Culture   Final    NO GROWTH 5 DAYS Performed at [facility], [address] [place], [place] [address]    Report Status 05/28/2020 FINAL  Final  MRSA PCR Screening     Status: Abnormal   Collection Time: 05/23/20  8:16 AM   Specimen: Nasopharyngeal  Result Value Ref Range Status   MRSA by PCR POSITIVE (A) NEGATIVE Final    Comment:        The GeneXpert MRSA Assay (FDA approved for NASAL specimens only), is one component of a comprehensive MRSA colonization surveillance program. It is not intended to diagnose MRSA infection nor to guide or monitor treatment for MRSA infections. RESULT CALLED TO, READ BACK BY AND VERIFIED WITH: ANNIE SMITH AT [id number] ON 05/23/2020 [place]. Performed at [facility], [address] [place], [place] [address]   Culture, sputum-assessment     Status: None   Collection Time: 05/24/20  8:58 AM   Specimen: Sputum  Result Value Ref Range Status   Specimen Description SPUTUM  Final   Special Requests NONE  Final   Sputum evaluation   Final    THIS SPECIMEN IS ACCEPTABLE FOR SPUTUM CULTURE Performed at [facility], [place], [place]  [address]    Report Status 05/24/2020 FINAL  Final    Culture, respiratory     Status: None   Collection Time: 05/24/20  8:58 AM   Specimen: SPU  Result Value Ref Range Status   Specimen Description   Final    SPUTUM Performed at [facility], [address] [place], [place] [address]    Special Requests   Final    NONE Reflexed from [id number] Performed at [facility], [address] [place], [place] [address]    Gram Stain   Final    MODERATE WBC PRESENT, PREDOMINANTLY PMN MODERATE SQUAMOUS EPITHELIAL CELLS PRESENT FEW GRAM POSITIVE COCCI IN CLUSTERS RARE YEAST Performed at [facility], [address], [place], [place] [address]    Culture   Final    MODERATE METHICILLIN RESISTANT STAPHYLOCOCCUS AUREUS   Report Status 05/26/2020 FINAL  Final   Organism ID, Bacteria METHICILLIN RESISTANT STAPHYLOCOCCUS AUREUS  Final      Susceptibility   Methicillin resistant staphylococcus aureus - MIC*    CIPROFLOXACIN >=8 RESISTANT Resistant     ERYTHROMYCIN >=8 RESISTANT Resistant     GENTAMICIN <=0.5 SENSITIVE Sensitive     OXACILLIN >=4 RESISTANT Resistant     TETRACYCLINE <=1 SENSITIVE Sensitive     VANCOMYCIN 1 SENSITIVE Sensitive     TRIMETH/SULFA >=320 RESISTANT Resistant     CLINDAMYCIN <=0.25 SENSITIVE Sensitive     RIFAMPIN <=0.5 SENSITIVE Sensitive     Inducible Clindamycin NEGATIVE Sensitive     * MODERATE METHICILLIN RESISTANT STAPHYLOCOCCUS AUREUS     Total time spend on discharging this patient, including the last patient exam, discussing the hospital stay, instructions for ongoing care as it relates to all pertinent caregivers, as well as preparing the medical discharge records, prescriptions, and/or referrals as applicable, is 35 minutes.    Enzo Bi, MD  [facility] 05/30/2020, 12:46 PM  If 7PM-7AM, please contact night-coverage

## 2020-05-30 NOTE — Progress Notes (Signed)
Pt discharged to [facility]. IV left in place per Flo Shanks.

## 2020-05-30 NOTE — Care Management Important Message (Signed)
Important Message  Patient Details  Name: Joanne [name] MRN: [id number] Date of Birth: [birth year]   Medicare Important Message Given:  Yes  Reviewed with daughter, Joanne [name]. Aware of Medicare IM right.     [name] 05/30/2020, 3:17 PM

## 2020-05-30 NOTE — TOC Transition Note (Signed)
Transition of Care [facility]) - CM/SW Discharge Note   Patient Details  Name: Joanne [name] MRN: [id number] Date of Birth: 1930-10-01  [facility]) CM/SW Contact:  Candie Chroman, LCSW Phone Number: 05/30/2020, 1:14 PM   Clinical Narrative:   Patient has orders to discharge to the [facility] today. Transport arranged through [employer] at 5:30 pm per hospice liaison request. She stated daughters are going to facility to complete paperwork. No further concerns. CSW signing off.  Final next level of care: [place] Barriers to Discharge: Barriers Resolved   Patient Goals and CMS Choice Patient states their goals for this hospitalization and ongoing recovery are:: Return to [facility] CMS Medicare.gov Compare Post Acute Care list provided to:: Patient Represenative (must comment) (Price,Cynthia (Daughter) [phone]) Choice offered to / list presented to : Adult Children  Discharge Placement                Patient to be transferred to facility by: [facility]   Patient and family notified of of transfer: 05/30/20  Discharge Plan and Services In-house Referral: Clinical Social Work                                   Social Determinants of Health (SDOH) Interventions     Readmission Risk Interventions No flowsheet data found.

## 2020-05-30 NOTE — Progress Notes (Signed)
[facility] Liaison note:   Follow visit made to new referral for [facility] hosp[facility] home. Patient's daughter's and son present.in the room.  [facility] is able to offer a bed today.  Family agreeable to transfer today. Hospital care team updated. Signed out of facility DNR in place in the chart to accompany patient at discharge. Report called to the hospice home. TOC [place] [facility] to arrange transport for 5:30 pm via [facility]. Thank you for the opportunity to be involved in the care of this patient and her family. Flo Shanks BSN, RN, [place] [phone]

## 2020-06-01 ENCOUNTER — Ambulatory Visit: Payer: Medicare Other | Admitting: Family

## 2020-06-27 DEATH — deceased

## 2020-08-20 IMAGING — DX DG KNEE COMPLETE 4+V*R*
4 series · 4 of 4 positions shown · non-contrast
Comparison: No recent.

CLINICAL DATA: Right knee pain.

EXAM:
RIGHT KNEE - COMPLETE 4+ VIEW

[knee ap]
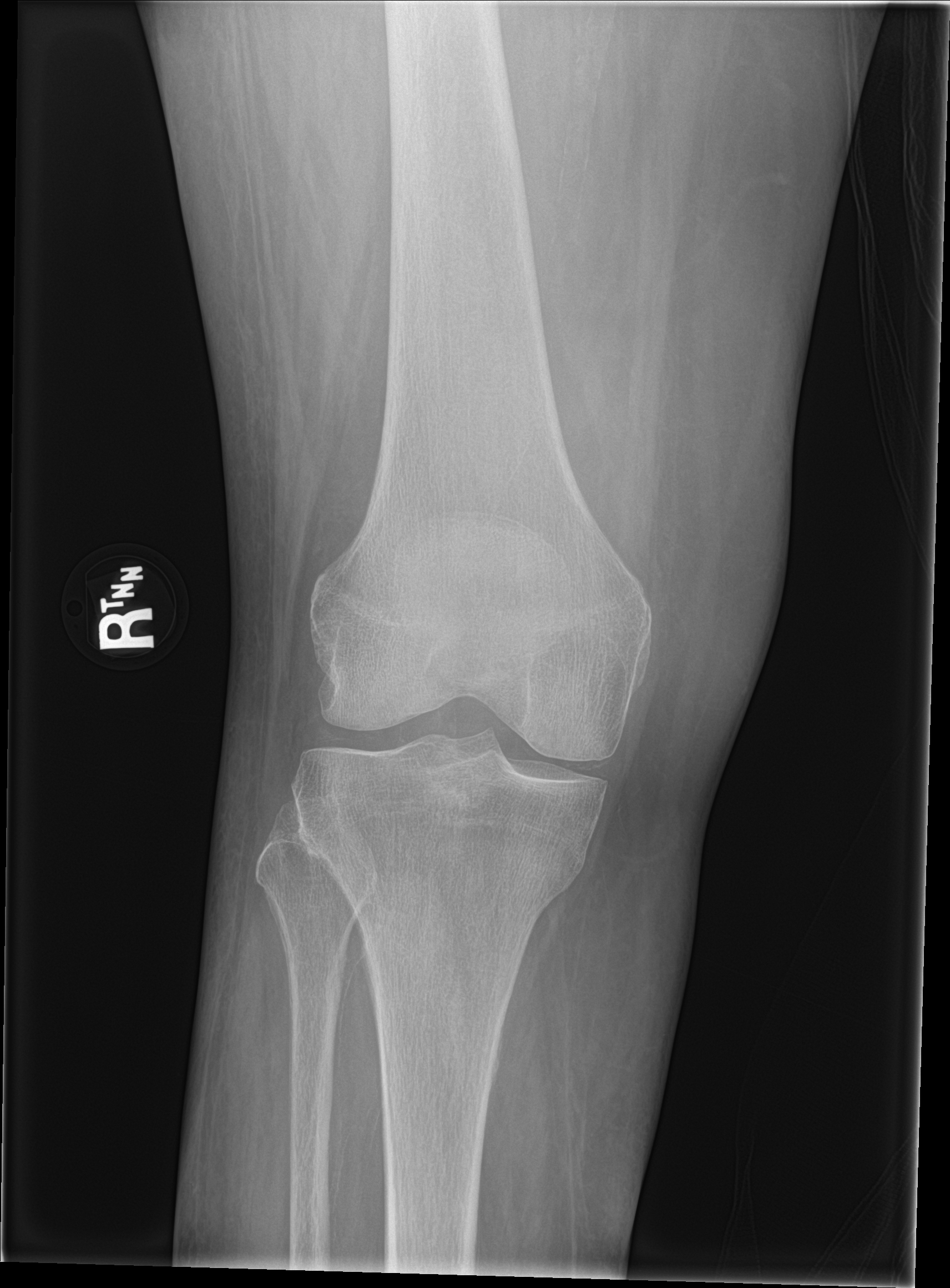

[knee tunnel]
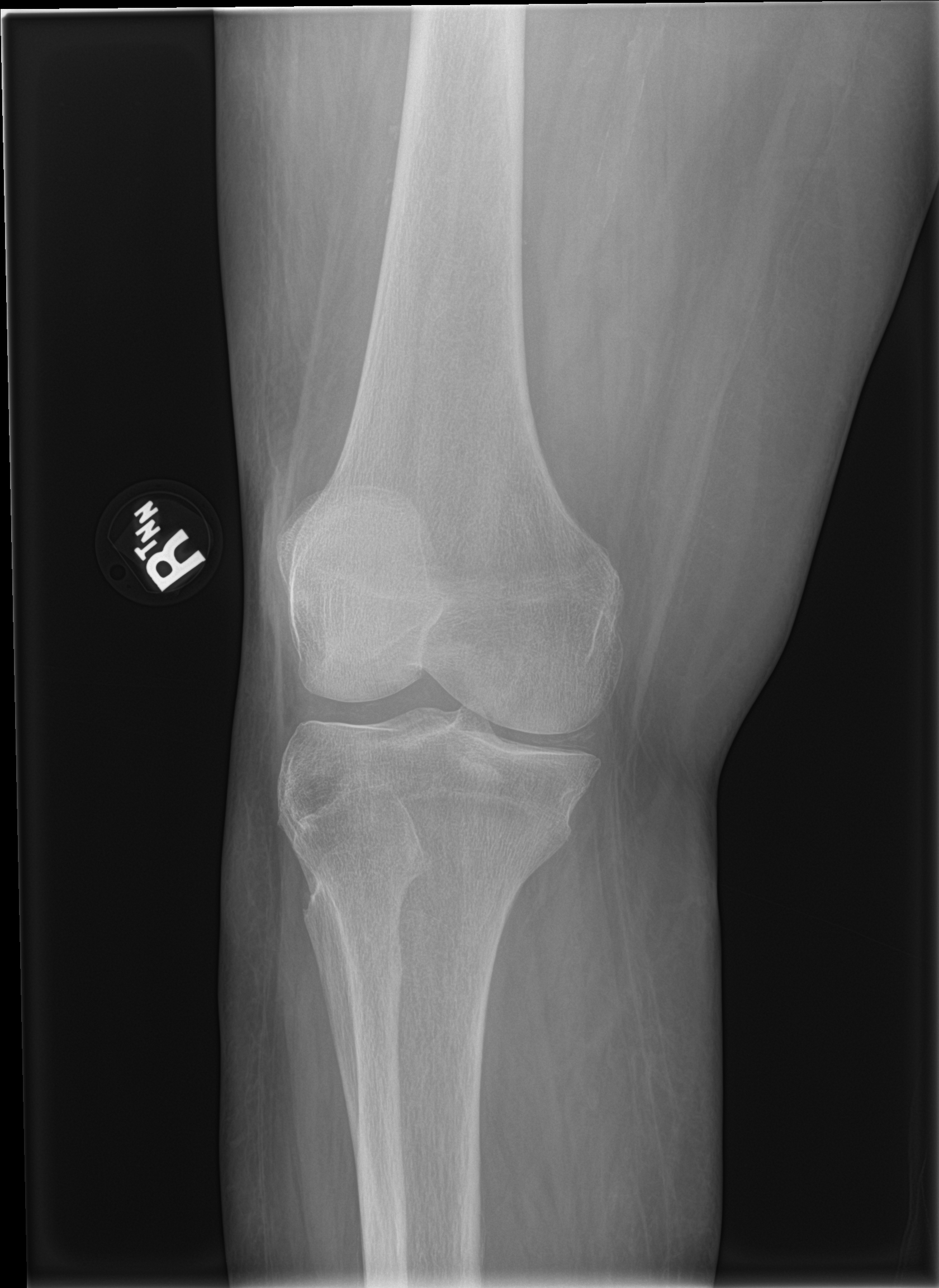

[knee lat]
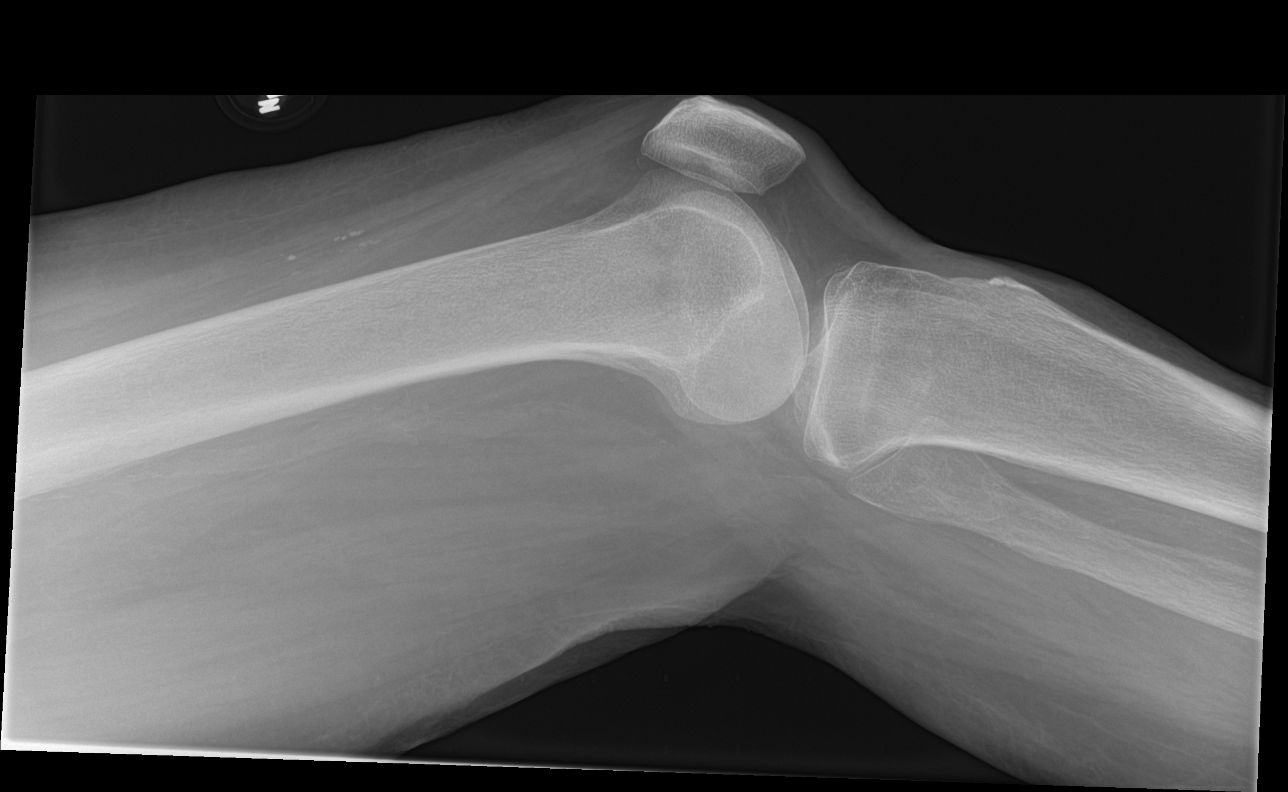

[knee obl]
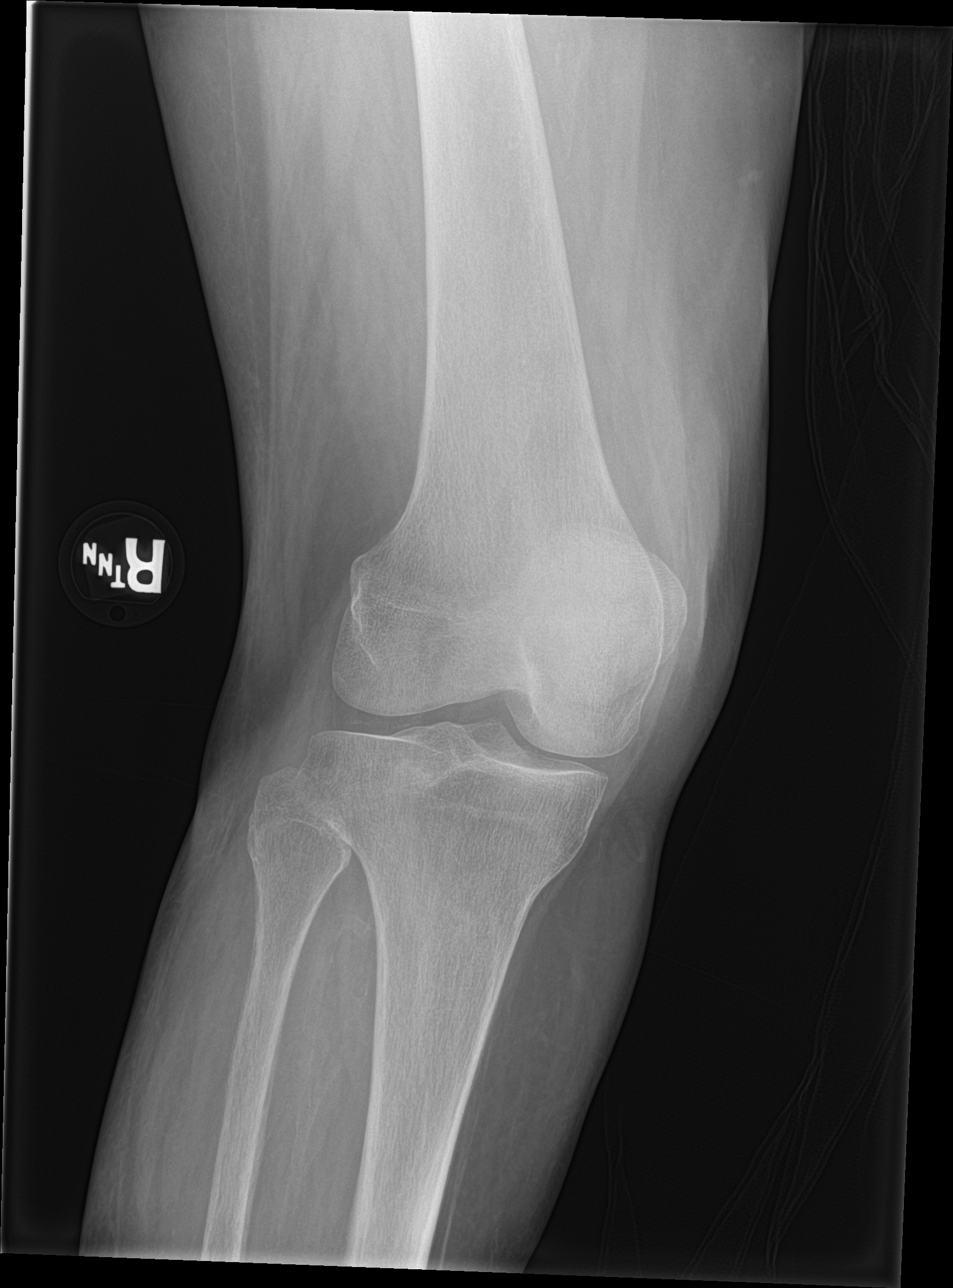

[4 of 4 positions shown; findings below may reference images not displayed]

FINDINGS: No acute bony or joint abnormality identified. No evidence of
fracture or dislocation. Mild degenerative change with
chondrocalcinosis noted. Calcification noted in the quadriceps
tendon, this is most likely dystrophic. Peripheral vascular
calcification noted.
IMPRESSION: 1.  No acute bony abnormality identified.

2.  Mild degenerative change with chondrocalcinosis.

3. Calcification noted in the quadriceps tendon. This is most likely
dystrophic and secondary tendinopathy.

4.  Peripheral vascular disease.

## 2020-08-20 IMAGING — CR DG HIP (WITH OR WITHOUT PELVIS) 2-3V*R*
3 series · 3 of 3 positions shown · non-contrast
Comparison: 04/01/2017

CLINICAL DATA: Right hip pain

EXAM:
DG HIP (WITH OR WITHOUT PELVIS) 2-3V RIGHT

[hip ap]
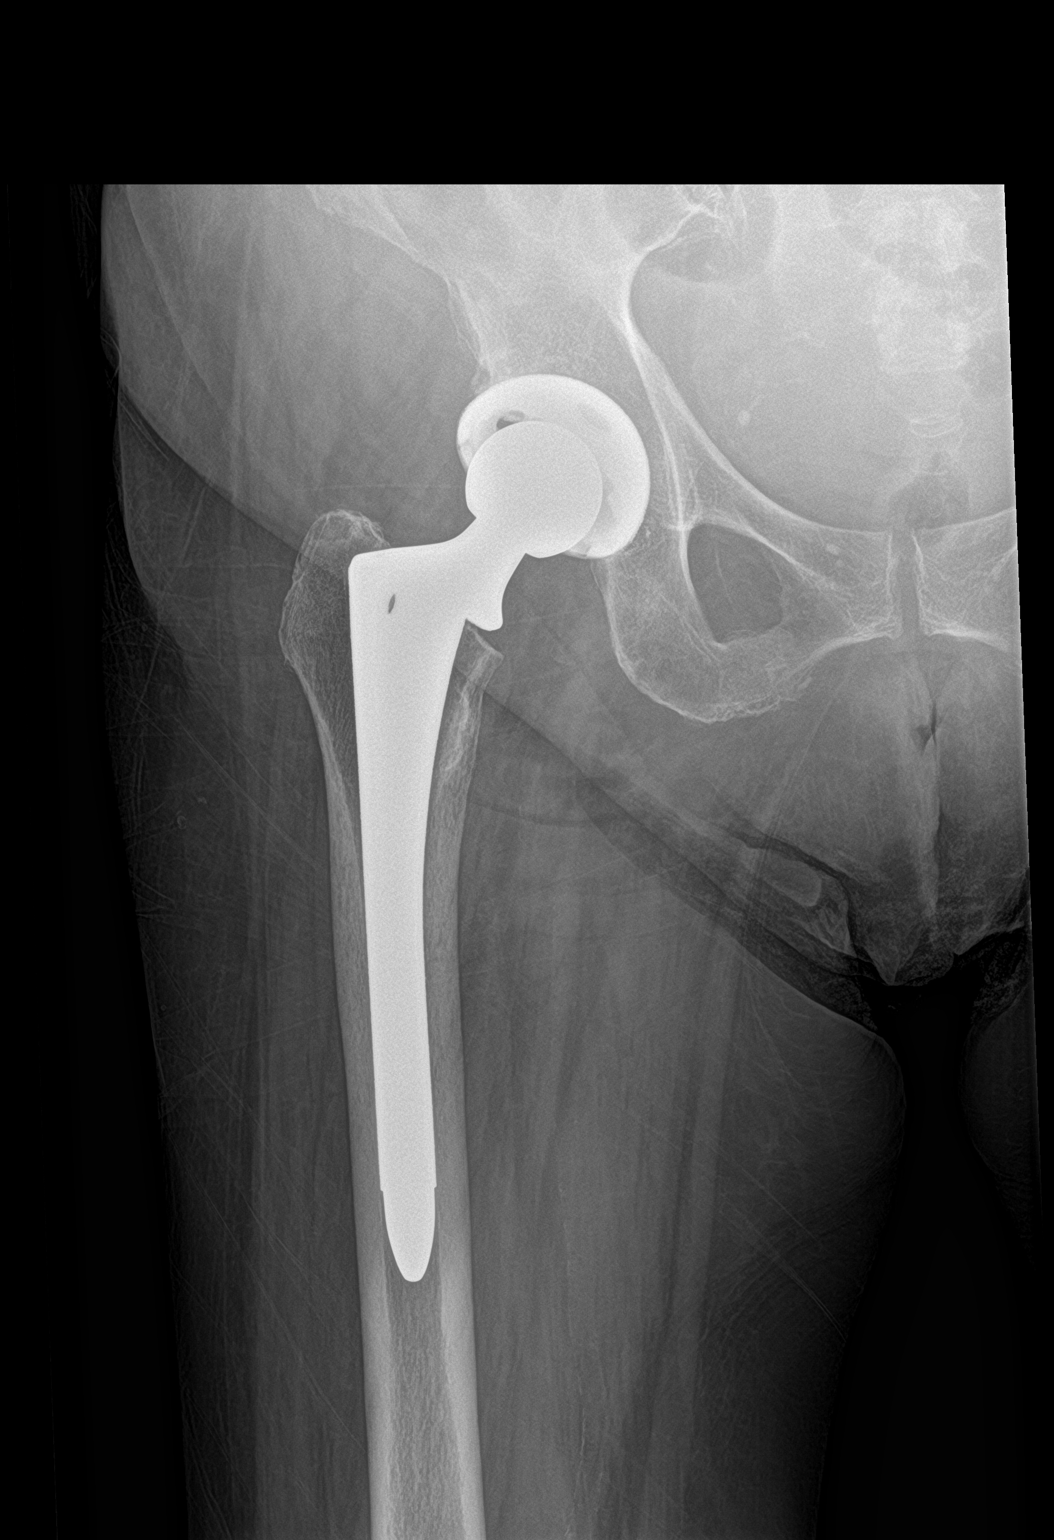

[hip lat]
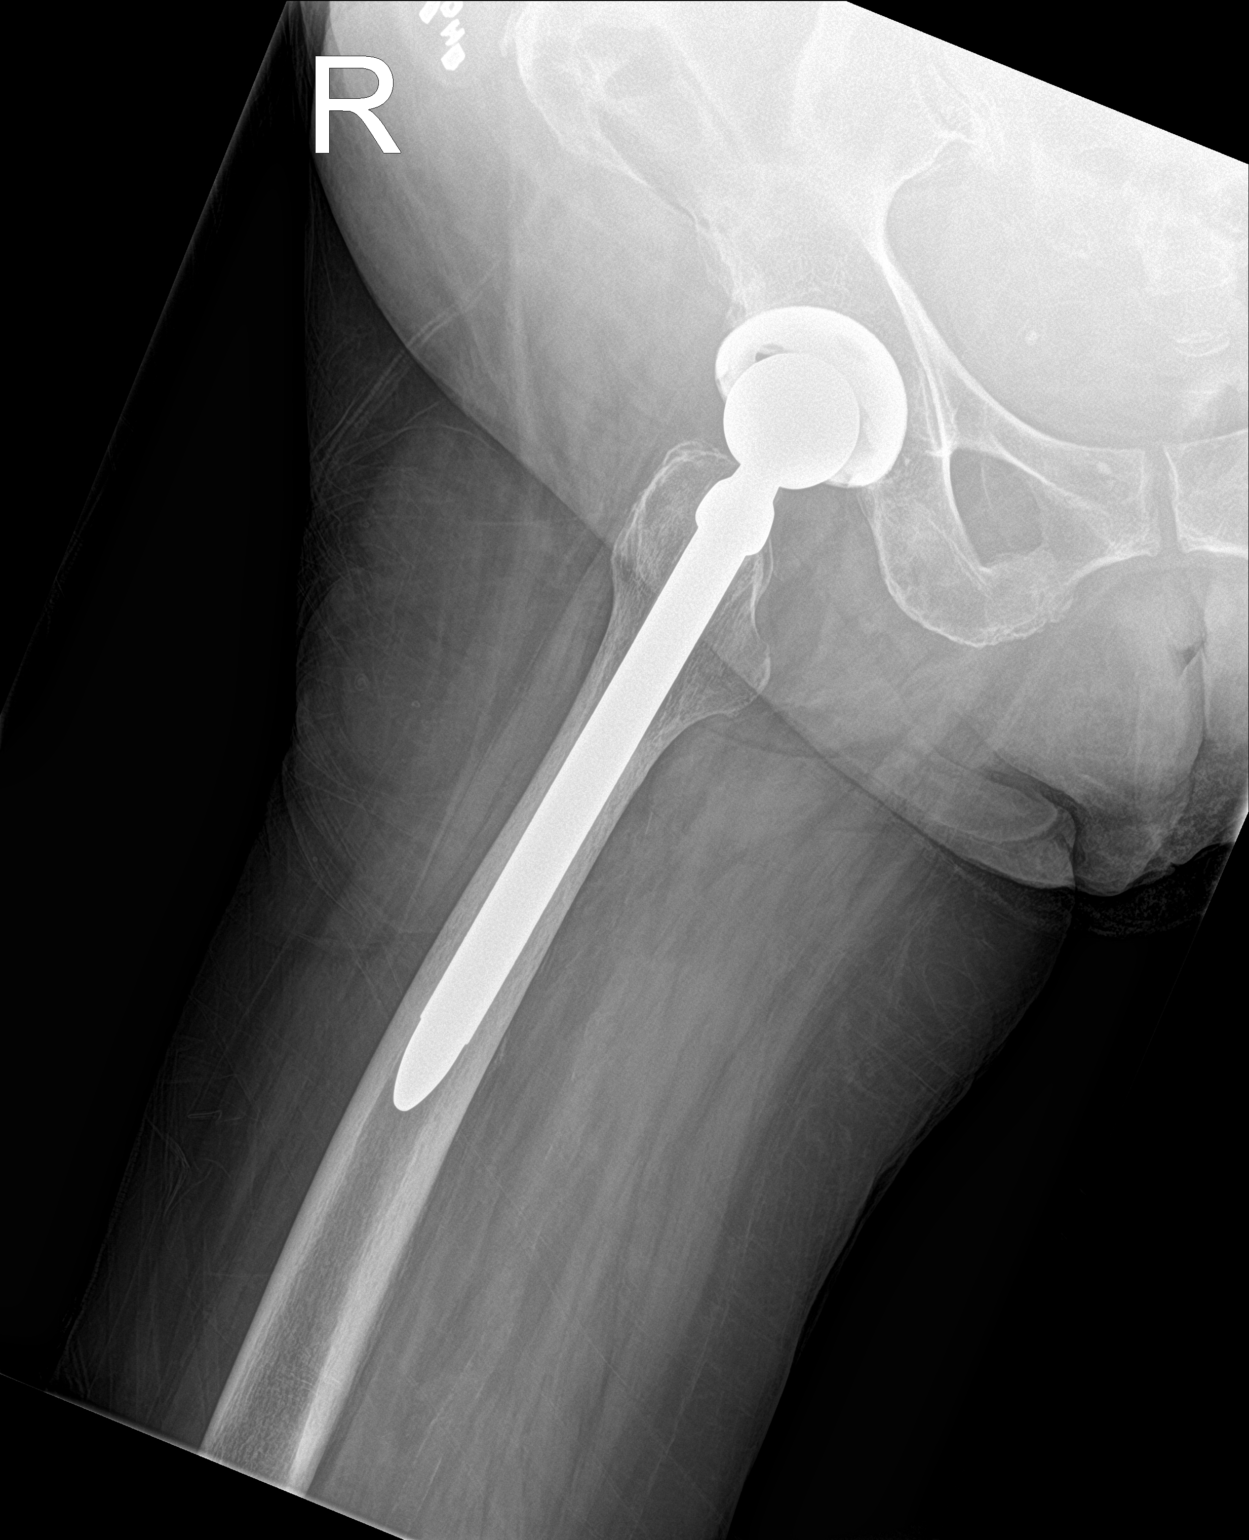

[pelvis ap]
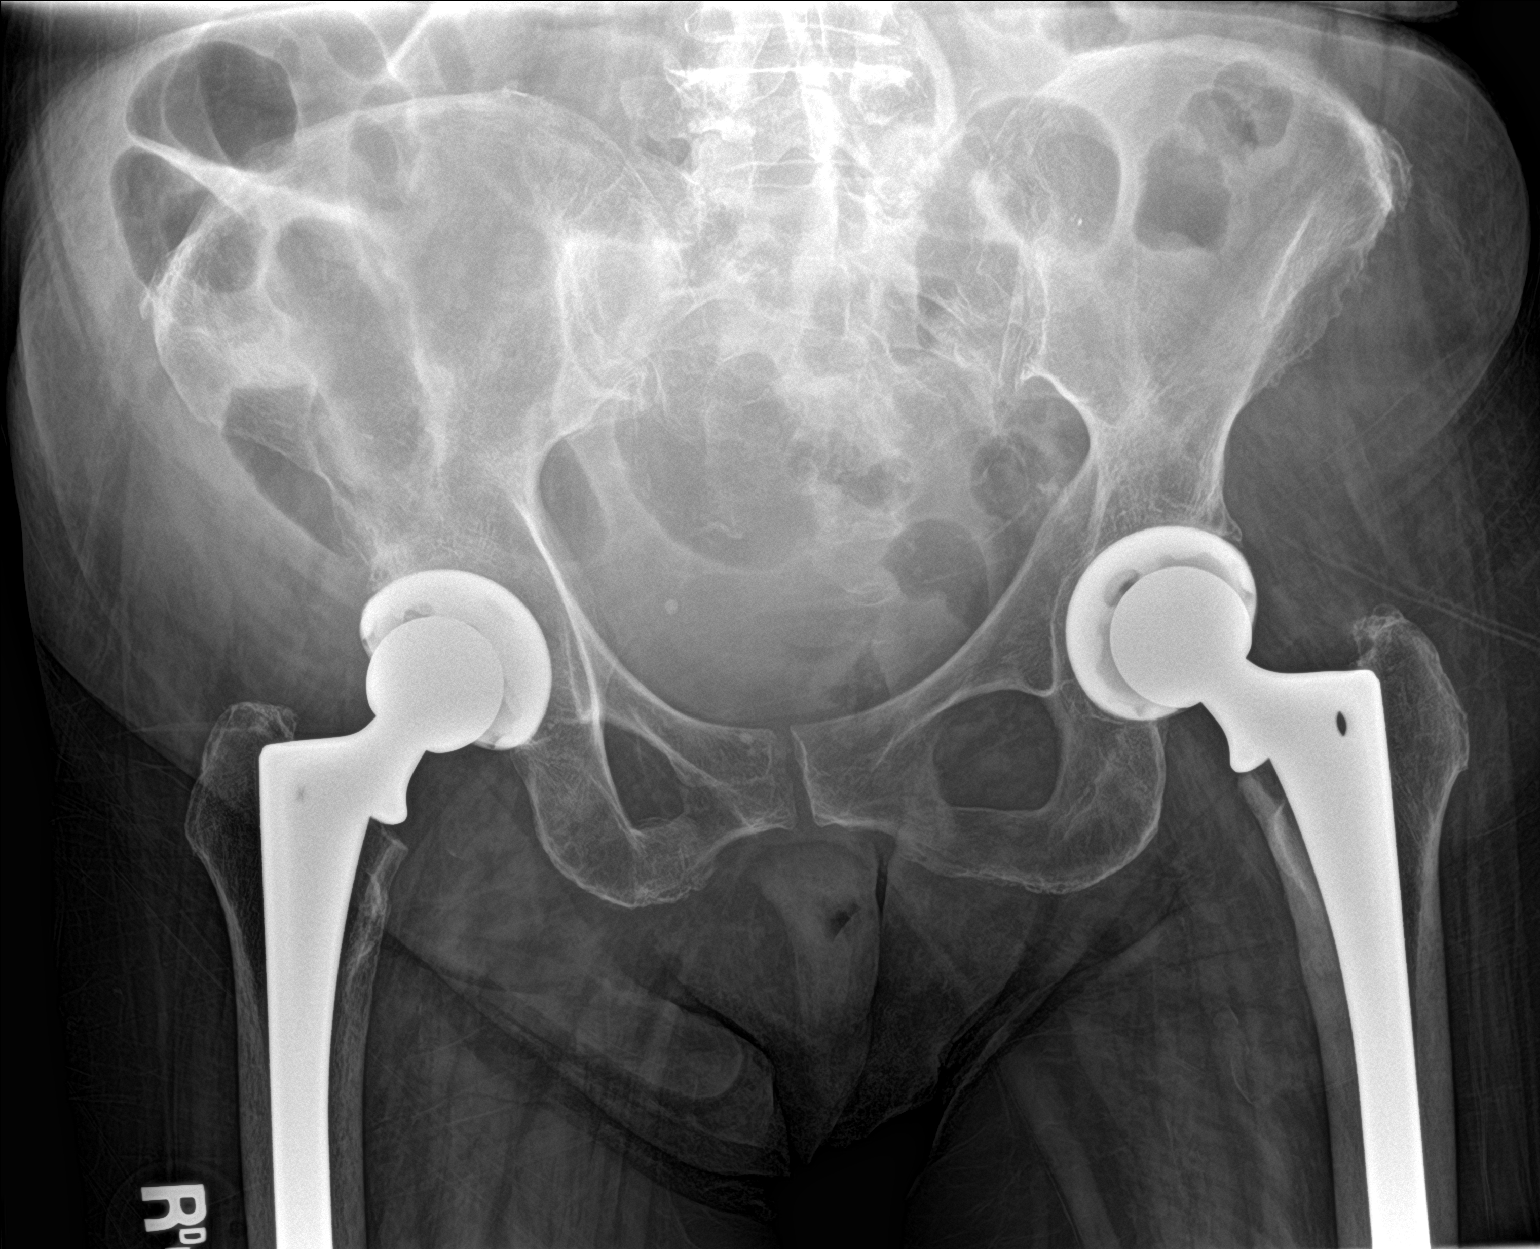

[3 of 3 positions shown; findings below may reference images not displayed]

FINDINGS: Old fracture of the right inferior pubic ramus. Right hip
arthroplasty is located without a periprosthetic fracture. Pelvic
bony ring is intact. No gross abnormality to the left hip
arthroplasty but the entire left femoral prosthesis is not imaged.
IMPRESSION: No acute abnormality to the pelvis or right hip.

## 2022-06-25 ENCOUNTER — Encounter (INDEPENDENT_AMBULATORY_CARE_PROVIDER_SITE_OTHER): Payer: Self-pay
